# Patient Record
Sex: Female | Born: 1935 | Race: White | Hispanic: No | Smoking: Never smoker
Health system: Southern US, Community
[De-identification: ages and names within clinical notes are randomized; demographics above are authoritative.]

## PROBLEM LIST (undated history)

## (undated) DIAGNOSIS — K219 Gastro-esophageal reflux disease without esophagitis: Secondary | ICD-10-CM

## (undated) DIAGNOSIS — J45909 Unspecified asthma, uncomplicated: Secondary | ICD-10-CM

## (undated) DIAGNOSIS — M5030 Other cervical disc degeneration, unspecified cervical region: Secondary | ICD-10-CM

## (undated) DIAGNOSIS — M797 Fibromyalgia: Secondary | ICD-10-CM

## (undated) DIAGNOSIS — I10 Essential (primary) hypertension: Secondary | ICD-10-CM

## (undated) DIAGNOSIS — J40 Bronchitis, not specified as acute or chronic: Secondary | ICD-10-CM

## (undated) DIAGNOSIS — M419 Scoliosis, unspecified: Secondary | ICD-10-CM

## (undated) DIAGNOSIS — M1990 Unspecified osteoarthritis, unspecified site: Secondary | ICD-10-CM

## (undated) HISTORY — PX: TOTAL HIP REVISION: SHX763

## (undated) HISTORY — PX: TOTAL ABDOMINAL HYSTERECTOMY W/ BILATERAL SALPINGOOPHORECTOMY: SHX83

## (undated) HISTORY — PX: APPENDECTOMY: SHX54

## (undated) HISTORY — DX: Gastro-esophageal reflux disease without esophagitis: K21.9

## (undated) HISTORY — DX: Bronchitis, not specified as acute or chronic: J40

## (undated) HISTORY — DX: Essential (primary) hypertension: I10

## (undated) HISTORY — DX: Unspecified osteoarthritis, unspecified site: M19.90

## (undated) HISTORY — PX: TOTAL ABDOMINAL HYSTERECTOMY: SHX209

## (undated) HISTORY — PX: COLONOSCOPY: SHX174

## (undated) HISTORY — PX: ERCP: SHX60

---

## 1997-12-13 ENCOUNTER — Other Ambulatory Visit: Admission: RE | Admit: 1997-12-13 | Discharge: 1997-12-13 | Payer: Self-pay | Admitting: Gynecology

## 1999-03-28 ENCOUNTER — Other Ambulatory Visit: Admission: RE | Admit: 1999-03-28 | Discharge: 1999-03-28 | Payer: Self-pay | Admitting: Gynecology

## 2000-03-11 ENCOUNTER — Encounter: Admission: RE | Admit: 2000-03-11 | Discharge: 2000-03-11 | Payer: Self-pay | Admitting: Neurosurgery

## 2000-03-11 ENCOUNTER — Encounter: Payer: Self-pay | Admitting: Neurosurgery

## 2000-06-14 ENCOUNTER — Other Ambulatory Visit: Admission: RE | Admit: 2000-06-14 | Discharge: 2000-06-14 | Payer: Self-pay | Admitting: *Deleted

## 2001-07-05 ENCOUNTER — Emergency Department (HOSPITAL_COMMUNITY): Admission: EM | Admit: 2001-07-05 | Discharge: 2001-07-05 | Payer: Self-pay | Admitting: *Deleted

## 2002-03-04 ENCOUNTER — Ambulatory Visit (HOSPITAL_COMMUNITY): Admission: RE | Admit: 2002-03-04 | Discharge: 2002-03-04 | Payer: Self-pay | Admitting: Gastroenterology

## 2002-05-18 ENCOUNTER — Other Ambulatory Visit: Admission: RE | Admit: 2002-05-18 | Discharge: 2002-05-18 | Payer: Self-pay | Admitting: Obstetrics & Gynecology

## 2002-11-26 ENCOUNTER — Emergency Department (HOSPITAL_COMMUNITY): Admission: AD | Admit: 2002-11-26 | Discharge: 2002-11-26 | Payer: Self-pay | Admitting: Family Medicine

## 2003-05-26 ENCOUNTER — Encounter: Admission: RE | Admit: 2003-05-26 | Discharge: 2003-07-08 | Payer: Self-pay | Admitting: Internal Medicine

## 2004-10-10 ENCOUNTER — Ambulatory Visit: Payer: Self-pay | Admitting: Internal Medicine

## 2004-10-13 ENCOUNTER — Ambulatory Visit: Payer: Self-pay | Admitting: Internal Medicine

## 2004-10-25 ENCOUNTER — Ambulatory Visit: Payer: Self-pay | Admitting: Internal Medicine

## 2004-11-20 ENCOUNTER — Ambulatory Visit: Payer: Self-pay | Admitting: Internal Medicine

## 2004-11-27 ENCOUNTER — Ambulatory Visit: Payer: Self-pay | Admitting: Internal Medicine

## 2005-04-18 ENCOUNTER — Ambulatory Visit: Payer: Self-pay | Admitting: Otolaryngology

## 2005-04-18 ENCOUNTER — Other Ambulatory Visit: Payer: Self-pay

## 2005-04-26 ENCOUNTER — Ambulatory Visit: Payer: Self-pay | Admitting: Otolaryngology

## 2006-12-03 ENCOUNTER — Encounter: Admission: RE | Admit: 2006-12-03 | Discharge: 2006-12-03 | Payer: Self-pay | Admitting: Orthopedic Surgery

## 2007-03-30 HISTORY — PX: TOTAL HIP ARTHROPLASTY: SHX124

## 2007-04-21 ENCOUNTER — Encounter: Admission: RE | Admit: 2007-04-21 | Discharge: 2007-04-21 | Payer: Self-pay | Admitting: Obstetrics and Gynecology

## 2007-11-10 ENCOUNTER — Inpatient Hospital Stay (HOSPITAL_COMMUNITY): Admission: RE | Admit: 2007-11-10 | Discharge: 2007-11-14 | Payer: Self-pay | Admitting: Orthopedic Surgery

## 2008-02-23 ENCOUNTER — Encounter: Admission: RE | Admit: 2008-02-23 | Discharge: 2008-02-23 | Payer: Self-pay | Admitting: Neurosurgery

## 2009-01-29 HISTORY — PX: SHOULDER ARTHROSCOPY: SHX128

## 2009-05-21 ENCOUNTER — Encounter: Admission: RE | Admit: 2009-05-21 | Discharge: 2009-05-21 | Payer: Self-pay | Admitting: Orthopedic Surgery

## 2009-07-11 ENCOUNTER — Emergency Department (HOSPITAL_COMMUNITY): Admission: EM | Admit: 2009-07-11 | Discharge: 2009-07-11 | Payer: Self-pay | Admitting: Emergency Medicine

## 2009-12-01 ENCOUNTER — Encounter: Payer: Self-pay | Admitting: Internal Medicine

## 2009-12-05 ENCOUNTER — Encounter: Payer: Self-pay | Admitting: Internal Medicine

## 2009-12-07 ENCOUNTER — Encounter: Payer: Self-pay | Admitting: Internal Medicine

## 2009-12-07 ENCOUNTER — Ambulatory Visit: Payer: Self-pay | Admitting: Internal Medicine

## 2009-12-07 DIAGNOSIS — R0989 Other specified symptoms and signs involving the circulatory and respiratory systems: Secondary | ICD-10-CM | POA: Insufficient documentation

## 2009-12-07 DIAGNOSIS — M159 Polyosteoarthritis, unspecified: Secondary | ICD-10-CM | POA: Insufficient documentation

## 2009-12-07 DIAGNOSIS — J309 Allergic rhinitis, unspecified: Secondary | ICD-10-CM | POA: Insufficient documentation

## 2009-12-07 DIAGNOSIS — I10 Essential (primary) hypertension: Secondary | ICD-10-CM | POA: Insufficient documentation

## 2009-12-07 DIAGNOSIS — J209 Acute bronchitis, unspecified: Secondary | ICD-10-CM | POA: Insufficient documentation

## 2009-12-09 ENCOUNTER — Inpatient Hospital Stay (HOSPITAL_COMMUNITY): Admission: RE | Admit: 2009-12-09 | Discharge: 2009-12-11 | Payer: Self-pay | Admitting: Orthopedic Surgery

## 2009-12-13 LAB — CONVERTED CEMR LAB: IgE (Immunoglobulin E), Serum: 25.8 intl units/mL (ref 0.0–180.0)

## 2010-01-13 ENCOUNTER — Ambulatory Visit: Payer: Self-pay | Admitting: Internal Medicine

## 2010-02-18 ENCOUNTER — Encounter: Payer: Self-pay | Admitting: Internal Medicine

## 2010-02-19 ENCOUNTER — Encounter: Payer: Self-pay | Admitting: General Surgery

## 2010-03-02 NOTE — Letter (Signed)
Summary: Minna Merritts MD  Minna Merritts MD   Imported By: Phillis Knack 12/10/2009 11:44:15  _____________________________________________________________________  External Attachment:    Type:   Image     Comment:   External Document

## 2010-03-02 NOTE — Assessment & Plan Note (Signed)
Summary: consult-?bronchitis/shoulder surgery in am/ok per CDY/kcw   Primary Provider/Referring Provider:  Veronia Beets, MD  CC:  Pulmonary Consult- ? bronchitis; Shoulder surgery in am; Dr. Ernesto Rutherford.Marland Kitchen  History of Present Illness: December 07, 2009- 76yoF referred by Dr Ernesto Rutherford for evaluation of bronchitis. He recently did laryngoscopy, reporting cords and naspopharynx clear, but seeing mucus in tracheobronchial tree. She is about to have shoulder surgery by Dr Onnie Graham.  She smoked for a little while in her 20's, and was on allergy vaccine for a year remotely for allergic rhinitis, but no hx of known lung disease or pneumonia.  Over the past 3 years she has had several episodes of nonseasonal bronchitis with some persistent dry cough and wheeze. Most episodes are associated with laryngitis and hoarseness, and tend to last several days to a week. The only consistent trigger she says has been exposure to cats or to people who have cats. She admits hx GERD and is on prilosec, but not aware of a connection. Denies fever, chills, purulent or bloody secretions, chest pain or palpitation. Her most recent flare began 5 days ago and is improving. Prior to that she had felt very well while on a series of antibiotics by Dr Ernesto Rutherford, followed by his laryngoscopy.  Hx recurrent tonsillits. says Mupiricin nasal cream for surgical prep has burned in her nose. Has never had flu or pneumonia vaccine and doesn't want them the day before surgery.  CXR- 12/05/09- No acute disease, hyperinflation suggestive of airtrapping, scoliosis. Some linear marking in lower zones due to scarring or atelectasis.  Preventive Screening-Counseling & Management  Alcohol-Tobacco     Smoking Status: quit     Year Quit: in her 20's  Current Medications (verified): 1)  Synthroid 112 Mcg Tabs (Levothyroxine Sodium) .... Take 1 By Mouth Once Daily 2)  Singulair 10 Mg Tabs (Montelukast Sodium) .... Take 1 By Mouth Once Daily 3)  Klor-Con  10 10 Meq Cr-Tabs (Potassium Chloride) .... Take 1 By Mouth Once Daily 4)  Benicar Hct 20-12.5 Mg Tabs (Olmesartan Medoxomil-Hctz) .... Take 1 By Mouth Once Daily 5)  Prilosec 20 Mg Cpdr (Omeprazole) .... Take 1 By Mouth Once Daily 6)  Fish Oil 1000 Mg Caps (Omega-3 Fatty Acids) .... Take 1 By Mouth Once Daily 7)  Flax Seed Oil 1000 Mg Caps (Flaxseed (Linseed)) .... Take 1 By Mouth Once Daily 8)  Vitamin D3 1000 Unit Caps (Cholecalciferol) .... Take 1 By Mouth Once Daily 9)  Claritin 10 Mg Tabs (Loratadine) .... Take 1 By Mouth Once Daily  Allergies (verified): 1)  ! Ampicillin 2)  ! Doxycycline 3)  ! Pcn 4)  ! Biaxin 5)  ! Keflex 6)  ! * Tequin 7)  ! Codeine  Past History:  Family History: Last updated: 12/07/2009 Allergies: mother Asthma: mother Heart Disease: Grandmother, Grandfather RA: Grandmother Mother- died ovarian cancer Father- died pancreatic cancer Sister- Multiple Sclerosis, recurrent DVT/ coumadin Son- died after traumatic quadriplegia  Social History: Last updated: 12/07/2009 Patient states former smoker. 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 GERD Osteoarthritis  Past Surgical History: Bilateral hip replacement T A H and B S O Appendectomy  Family History: Allergies: mother Asthma: mother Heart Disease: Grandmother, Grandfather RA: Grandmother Mother- died ovarian cancer Father- died pancreatic cancer Sister- Multiple Sclerosis, recurrent DVT/ coumadin Son- died after traumatic quadriplegia  Social History: Patient states former  smoker. 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         Occasional pain across back at strap level  Vital Signs:  Patient profile:   77 year old female Height:      60 inches Weight:      161 pounds BMI:     31.56 O2 Sat:      95 % on Room air Pulse rate:   75 / minute BP sitting:   108 / 70  (left arm) Cuff size:   regular  Vitals Entered By: Clayborne Dana CMA (December 07, 2009 2:35 PM)  O2 Flow:  Room air  Physical Exam  Additional Exam:  General: A/Ox3; pleasant and cooperative, NAD,  Room air O2 sat 95%, P 75, BP 108/70 SKIN: no rash, lesions NODES: no lymphadenopathy HEENT: [place]/AT, EOM- WNL, Conjuctivae- clear, PERRLA, TM-WNL, Nose- clear, no mucus, Throat- clear and wnl, Mallampati  II, no stridor NECK: Supple w/ fair ROM, JVD- none, Carotids- I / VI bruit left carotid.  Thyroid- normal to palpation CHEST: raspy occasional cough. unlabored, no dullness, rales or wheeze HEART: RRR, no m/g/r heard ABDOMEN: Soft and nl; nml bowel sounds; no organomegaly or masses noted [id number], nl pulses, no edema, cyanosis or clubbing. Osteoarthritic changes in fingers. NEURO: Grossly intact to observation      Impression & Recommendations:  Problem # 1:  ACUTE BRONCHITIS (ICD-466.0)  Resolving flare with residual raspy cough. We will get spirometry here and start sample Spiriva for trial.  She blames cat exposure. We can get RAST IgE assay to look at allergy significiance.  I am very suspicious that she recurrently refluxes as a trigger for her bronchitis. That can result from the anxiety she gets just sitting next to someone who she learns has a  cat. Reflux precautions are recommended. She is stable from a pulmonary standpoint for planned  surgery, but is certainly at long term risk for recurrent bronchitis. We can see her for pulmonary consultation as needed.     Her updated medication list for this problem includes:    Singulair 10 Mg Tabs (Montelukast sodium) .Marland Kitchen... Take 1 by mouth once daily  Orders: Consultation Level IV [id number]) T-Allergy Profile [facility] [place], [place], [place], [place], [place] [phone])  Problem # 2:  CAROTID BRUIT, LEFT (ICD-785.9) I am hearing a faint diffuse left carotid bruit, directed to the attention of her PCP.  Problem # 3:  ? of GERD (ICD-530.81) See discussion above. Reflux precautions emphasized.  Her updated medication list for this problem includes:    Prilosec 20 Mg Cpdr (Omeprazole) .Marland Kitchen... Take 1 by mouth once daily  Medications Added to Medication List This Visit: 1)  Synthroid 112 Mcg Tabs (Levothyroxine sodium) .... Take 1 by mouth once daily 2)  Singulair 10 Mg Tabs (Montelukast sodium) .... Take 1 by mouth once daily 3)  Klor-con 10 10 Meq Cr-tabs (Potassium chloride) .... Take 1 by mouth once daily 4)  Benicar Hct 20-12.5 Mg Tabs (Olmesartan medoxomil-hctz) .... Take 1 by mouth once daily 5)  Prilosec 20 Mg Cpdr (  Omeprazole) .... Take 1 by mouth once daily 6)  Fish Oil 1000 Mg Caps (Omega-3 fatty acids) .... Take 1 by mouth once daily 7)  Flax Seed Oil 1000 Mg Caps (Flaxseed (linseed)) .... Take 1 by mouth once daily 8)  Vitamin D3 1000 Unit Caps (Cholecalciferol) .... Take 1 by mouth once daily 9)  Claritin 10 Mg Tabs (Loratadine) .... Take 1 by mouth once daily  Patient Instructions: 1)  Please schedule a follow-up appointment in 1 month. 2)  Please watch out for possible reflux of stomach juice. Sit upright to eat or drink and don't lie down for an hour after you eat or drink. 3)  sample Spiriva 1 daily 4)  Office PFT today 5)  OK for surgery tomorrow 6)  lab 7)  cc: Dr Ernesto Rutherford, Dr Onnie Graham, Dr Melford Aase

## 2010-03-02 NOTE — Assessment & Plan Note (Signed)
Summary: rov 1 month ///kp   Primary Provider/Referring Provider:  Veronia Beets, MD  CC:  1 month follow up visit-allergies; Review labs with patient..  History of Present Illness: History of Present Illness: December 07, 2009- 76yoF referred by Dr Ernesto Rutherford for evaluation of bronchitis. He recently did laryngoscopy, reporting cords and naspopharynx clear, but seeing mucus in tracheobronchial tree. She is about to have shoulder surgery by Dr Onnie Graham.  She smoked for a little while in her 20's, and was on allergy vaccine for a year remotely for allergic rhinitis, but no hx of known lung disease or pneumonia.  Over the past 3 years she has had several episodes of nonseasonal bronchitis with some persistent dry cough and wheeze. Most episodes are associated with laryngitis and hoarseness, and tend to last several days to a week. The only consistent trigger she says has been exposure to cats or to people who have cats. She admits hx GERD and is on prilosec, but not aware of a connection. Denies fever, chills, purulent or bloody secretions, chest pain or palpitation. Her most recent flare began 5 days ago and is improving. Prior to that she had felt very well while on a series of antibiotics by Dr Ernesto Rutherford, followed by his laryngoscopy.  Hx recurrent tonsillits. says Mupiricin nasal cream for surgical prep has burned in her nose. Has never had flu or pneumonia vaccine and doesn't want them the day before surgery.  CXR- 12/05/09- No acute disease, hyperinflation suggestive of airtrapping, scoliosis. Some linear marking in lower zones due to scarring or atelectasis.  February 14, 2010- Bronchitis, hx allergic rhinitis Nurse-CC: 1 month follow up visit-allergies; Review labs with patient. After last here she had right shoulder replacement with prolonged recuperation. Also had to help her sister, with unavoidable exposure to sister's cat- very allergic to it with chest congestion/ wheeze, cough. Gets laryngitis.  Hx of allergy shots with Dr Velora Heckler not helpful in past.  Says peanut allergy seen w/ rash after eating, but not on skin test. Avoids friend who has cat.  Denies reflux taking Prilosec.   Consider barium swallow    Preventive Screening-Counseling & Management  Alcohol-Tobacco     Smoking Status: quit     Year Quit: in her 20's  Current Medications (verified): 1)  Synthroid 112 Mcg Tabs (Levothyroxine Sodium) .... Take 1 By Mouth Every Other Day 2)  Singulair 10 Mg Tabs (Montelukast Sodium) .... Take 1 By Mouth Once Daily 3)  Klor-Con 10 10 Meq Cr-Tabs (Potassium Chloride) .... Take 1 By Mouth Once Daily 4)  Benicar Hct 20-12.5 Mg Tabs (Olmesartan Medoxomil-Hctz) .... Take 1 By Mouth Once Daily 5)  Prilosec 20 Mg Cpdr (Omeprazole) .... Take 1 By Mouth Once Daily 6)  Fish Oil 1000 Mg Caps (Omega-3 Fatty Acids) .... Take 1 By Mouth Once Daily 7)  Flax Seed Oil 1000 Mg Caps (Flaxseed (Linseed)) .... Take 1 By Mouth Once Daily 8)  Vitamin D3 1000 Unit Caps (Cholecalciferol) .... Take 1 By Mouth Once Daily 9)  Claritin 10 Mg Tabs (Loratadine) .... Take 1 By Mouth Once Daily 10)  Synthroid 100 Mcg Tabs (Levothyroxine Sodium) .... Take 1 By Mouth Every Other Day 11)  Symbicort 80-4.5 Mcg/act Aero (Budesonide-Formoterol Fumarate) .... 2 Puffs Two Times A Day and Rinse Mouth After Use 12)  Proair Hfa 108 (90 Base) Mcg/act Aers (Albuterol Sulfate) .... 2 Puffs Four Times A Day As Needed  Allergies (verified): 1)  ! Ampicillin 2)  ! Doxycycline 3)  !  Pcn 4)  ! Biaxin 5)  ! Keflex 6)  ! * Tequin 7)  ! Codeine  Past History:  Past Medical History: Last updated: 12/07/2009 Recurrent bronchitis Hypertension ? GERD Osteoarthritis  Past Surgical History: Last updated: 12/07/2009 Bilateral hip replacement T A H and B S O Appendectomy  Family History: Last updated: 12/07/2009 Allergies: mother Asthma: mother Heart Disease: Grandmother, Grandfather RA: Grandmother Mother- died  ovarian cancer Father- died pancreatic cancer Sister- Multiple Sclerosis, recurrent DVT/ coumadin Son- died after traumatic quadriplegia  Social History: Last updated: 12/07/2009 Patient states former smoker. 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  The patient denies shortness of breath with activity, shortness of breath at rest, productive cough, coughing up blood, chest pain, irregular heartbeats, acid heartburn, indigestion, loss of appetite, difficulty swallowing, sore throat, tooth/dental problems, headaches, sneezing, itching, ear ache, hand/feet swelling, rash, change in color of mucus, and fever.    Vital Signs:  Patient profile:   77 year old female Height:      60 inches Weight:      155.25 pounds BMI:     30.43 O2 Sat:      98 % on Room air Pulse rate:   66 / minute BP sitting:   122 / 76  (left arm) Cuff size:   regular  Vitals Entered By: Clayborne Dana CMA (February 14, 2010 4:07 PM)  O2 Flow:  Room air CC: 1 month follow up visit-allergies; Review labs with patient.   Physical Exam  Additional Exam:  General: A/Ox3; pleasant and cooperative, NAD,  Room air O2 sat 98%, P 75, BP 108/70 SKIN: no rash, lesions NODES: no lymphadenopathy HEENT: [place]/AT, EOM- WNL, Conjuctivae- clear, PERRLA, TM-WNL, Nose- clear, no mucus, Throat- clear and wnl, Mallampati  II, no stridor NECK: Supple w/ fair ROM, JVD- none, Carotids- no bruit heard today.  Thyroid- normal to palpation CHEST: . unlabored, no dullness, rales or wheeze HEART: RRR, no m/g/r heard ABDOMEN: Soft and nl; nml bowel sounds; no organomegaly or masses noted [id number], nl pulses, no edema, cyanosis or clubbing. Osteoarthritic changes in fingers. NEURO: Grossly  intact to observation      Impression & Recommendations:  Problem # 1:  ACUTE BRONCHITIS (ICD-466.0)  She feels she reacts acutely with sustained hoarseness and congestion after exposure to cats. It may be a non allergic or nonIgE mechanism. Consider if reflux could be involved?  The situations may be associated with anxiety causing some esophageal spasm.  I reasurred her that Symbicort would not cause skin thinning on her leg. She didn't feel that Spiriva helped. We will try Atrovent inhaler for the anticholinergic mechanism.   Her updated medication list for this problem includes:    Singulair 10 Mg Tabs (Montelukast sodium) .Marland Kitchen... Take 1 by mouth once daily    Symbicort 80-4.5 Mcg/act Aero (Budesonide-formoterol fumarate) .Marland Kitchen... 2 puffs two times a day and rinse mouth after use    Proair Hfa 108 (90 Base) Mcg/act Aers (Albuterol sulfate) .Marland Kitchen... 2 puffs four times a day as needed    Atrovent Hfa 17 Mcg/act Aers (Ipratropium bromide hfa) .Marland Kitchen... 2 puffs four times a day as needed rescue inhaler  Problem # 2:  ALLERGIC RHINITIS (ICD-477.9)  Try nasalcrom for a different  mechainism of action.  Her updated medication list for this problem includes:    Claritin 10 Mg Tabs (Loratadine) .Marland Kitchen... Take 1 by mouth once daily  Medications Added to Medication List This Visit: 1)  Synthroid 112 Mcg Tabs (Levothyroxine sodium) .... Take 1 by mouth every other day 2)  Synthroid 100 Mcg Tabs (Levothyroxine sodium) .... Take 1 by mouth every other day 3)  Symbicort 80-4.5 Mcg/act Aero (Budesonide-formoterol fumarate) .... 2 puffs two times a day and rinse mouth after use 4)  Proair Hfa 108 (90 Base) Mcg/act Aers (Albuterol sulfate) .... 2 puffs four times a day as needed 5)  Atrovent Hfa 17 Mcg/act Aers (Ipratropium bromide hfa) .... 2 puffs four times a day as needed rescue inhaler  Other Orders: Est. Patient Level III [id number])  Patient Instructions: 1)  Please schedule a follow-up appointment in 1  month. 2)  Try script Atrovent HFA inhaler 3)      2 puffs 4 times daily as needed for chest tightness/ cough 4)      Try this instead of the yellow proventil or red Proair 5)  continue Symbicort 2 puffs and rinse mouth twice daily 6)  Try otc Nasalcrom/ cromolyn for allergic nose. Prescriptions: ATROVENT HFA 17 MCG/ACT AERS (IPRATROPIUM BROMIDE HFA) 2 puffs four times a day as needed rescue inhaler  #1 x prn   Entered and Authorized by:   Deneise Lever MD   Signed by:   Deneise Lever MD on 02/14/2010   Method used:   Print then Give to Patient   RxID:   [id number]

## 2010-03-13 NOTE — Discharge Summary (Signed)
NAME:  Penny Perez, Penny Perez NO.:  [id number]  MEDICAL RECORD NO.:  [id number]          PATIENT TYPE:  INP  LOCATION:  5013                         FACILITY:  [facility]  PHYSICIAN:  Metta Clines. Peggy Monk, M.D.  DATE OF BIRTH:  1932/03/15  DATE OF ADMISSION:  12/08/2009 DATE OF DISCHARGE:  12/11/2009                              DISCHARGE SUMMARY   ADMISSION DIAGNOSES: 1. End-stage osteoarthrosis, right shoulder with rotator cuff tear     arthropathy. 2. Hypertension. 3. Fibromyalgia. 4. Hypothyroidism. 5. Anxiety and depression. 6. History of chronic kidney disease, most likely due to nonsteroidal     anti-inflammatories.  DISCHARGE DIAGNOSES: 1. End-stage osteoarthrosis, right shoulder with rotator cuff tear     arthropathy. 2. Hypertension. 3. Fibromyalgia. 4. Hypothyroidism. 5. Anxiety and depression. 6. History of chronic kidney disease, most likely due to nonsteroidal     anti-inflammatories. 7. Status post right shoulder reverse arthroplasty.  OPERATION:  Right shoulder reverse arthroplasty.  SURGEON:  Metta Clines. Tyquisha Sharps, M.D.  Terrence DupontOlivia Mackie A. Perez, P.A.-C. under general anesthetic.  BRIEF HISTORY:  Penny Perez is a 77 year old female who has had chronic right shoulder pain secondary to chronic rotator cuff tear arthropathy with failure to improve on outpatient conservative measures including injections and conservative treatment.  At this time, surgery was indicated in the form of above and she wished to proceed.  HOSPITAL COURSE:  The patient was admitted and underwent the above-named procedure, and tolerated this well.  All appropriate IV antibiotics and analgesics were utilized.  The patient did have a fair amount of pain postop that required additional stay.  She was miserable as she explained it for the first few days.  She was on chronic Percocet for low back pain, so medications were provided in the hospital, were difficult to manage.  She  was able to be weaned off IV analgesics.  She was having difficult time ambulating, however, so PT/OT were ordered. She was kept over the next several days, home health was consulted for postoperative care.  Her pain improved, by postoperative day #3, however, she did have some complaints of indigestion and some moderate discomfort into her chest.  EKG showed no change, this was felt to be more muscular in origin.  The patient stabilized and all arrangements were made for discharge to home for followup on outpatient basis.  On the day of discharge, she was afebrile with T-max of 99.1, right shoulder was neurovascularly intact.  Dressings were dry.  At this time, she was stable for discharge to home with home health ordered through [place].  CONDITION ON DISCHARGE:  Stable and improved.  DISCHARGE MEDS AND PLANS:  The patient had been discharged to home. [name] has been consulted for home health care.  She is on the following medications;  Dilaudid, methocarbamol, and Percocet as well as Restoril added to her home medications.  See med reconciliation for additional details.  Follow up in our office in 2 weeks, call for time. All the rest of instructions were provided to the patient at the time of discharge.     Penny Perez, P.A.-C.   ______________________________ Metta Clines. Charline Hoskinson,  M.D.    TAS/MEDQ  D:  01/04/2010  T:  01/05/2010  Job:  [id number]  Electronically Signed by Jenetta Loges P.A.-C. on 02/22/2010 08:15:51 AM Electronically Signed by Justice Britain M.D. on 03/13/2010 12:04:13 PM

## 2010-03-21 ENCOUNTER — Encounter: Payer: Self-pay | Admitting: Internal Medicine

## 2010-03-21 ENCOUNTER — Ambulatory Visit (INDEPENDENT_AMBULATORY_CARE_PROVIDER_SITE_OTHER): Payer: Medicare Other | Admitting: Internal Medicine

## 2010-03-21 DIAGNOSIS — J209 Acute bronchitis, unspecified: Secondary | ICD-10-CM

## 2010-03-21 DIAGNOSIS — J309 Allergic rhinitis, unspecified: Secondary | ICD-10-CM

## 2010-03-28 NOTE — Assessment & Plan Note (Signed)
Summary: rov 1 month/kp   Primary Provider/Referring Provider:  Veronia Beets, MD  CC:  1 month followup/ used Symbicort 1 x had reaction, mouth broke out blisters breathing is better, and occasional sneezing. pt denies sob.  History of Present Illness: February 14, 2010- Bronchitis, hx allergic rhinitis Nurse-CC: 1 month follow up visit-allergies; Review labs with patient. After last here she had right shoulder replacement with prolonged recuperation. Also had to help her sister, with unavoidable exposure to sister's cat- very allergic to it with chest congestion/ wheeze, cough. Gets laryngitis. Hx of allergy shots with Dr Velora Heckler not helpful in past.  Says peanut allergy seen w/ rash after eating, but not on skin test. Avoids friend who has cat.  Denies reflux taking Prilosec.   Consider barium swallow  March 21, 2010-  Bronchitis, hx allergic rhinitis Nurse-CC: 1 month followup/ used Atrovent HFA  had reaction, mouth broke out blisters breathing is better, occasional sneezing. pt denies sob Lungs are better. She is convinced that what bothers her lungs is exposure to cat. Her allergy profile was neg for Cat IgE and skin testing by Dr Velora Heckler was neg for cat. Allergy vacc for a year didn't help. . She claims that house dust and house cleaning might bother her. Flowers do cause sneeze, but not lungs. Blames Atrovent HFA for irritating mouth. - needed a week to clear. Today chest feels good.  Allergy profile- IgE 25.8, Specifics negative. Office spiro- 12/07/09- FEV1 1.50/85%; FVC 1.87/ 79%; R 1.07; 25-75= 108%.     Preventive Screening-Counseling & Management  Alcohol-Tobacco     Smoking Status: quit     Year Quit: in her 20's  Current Medications (verified): 1)  Synthroid 112 Mcg Tabs (Levothyroxine Sodium) .... Take 1 By Mouth Every Other Day 2)  Singulair 10 Mg Tabs (Montelukast Sodium) .... Take 1 By Mouth Once Daily 3)  Klor-Con 10 10 Meq Cr-Tabs (Potassium Chloride) .... Take 1  By Mouth Once Daily 4)  Benicar Hct 20-12.5 Mg Tabs (Olmesartan Medoxomil-Hctz) .... Take 1 By Mouth Once Daily 5)  Prilosec 20 Mg Cpdr (Omeprazole) .... Take 1 By Mouth Once Daily 6)  Fish Oil 1000 Mg Caps (Omega-3 Fatty Acids) .... Take 1 By Mouth Once Daily 7)  Flax Seed Oil 1000 Mg Caps (Flaxseed (Linseed)) .... Take 1 By Mouth Once Daily 8)  Vitamin D3 1000 Unit Caps (Cholecalciferol) .... Take 1 By Mouth Once Daily 9)  Claritin 10 Mg Tabs (Loratadine) .... Take 1 By Mouth Once Daily 10)  Synthroid 100 Mcg Tabs (Levothyroxine Sodium) .... Take 1 By Mouth Every Other Day 11)  Proair Hfa 108 (90 Base) Mcg/act Aers (Albuterol Sulfate) .... 2 Puffs Four Times A Day As Needed 12)  Atrovent Hfa 17 Mcg/act Aers (Ipratropium Bromide Hfa) .... 2 Puffs Four Times A Day As Needed Rescue Inhaler  Allergies: 1)  ! Ampicillin 2)  ! Doxycycline 3)  ! Pcn 4)  ! Biaxin 5)  ! Keflex 6)  ! * Tequin 7)  ! Codeine  Past History:  Past Surgical History: Last updated: 12/07/2009 Bilateral hip replacement T A H and B S O Appendectomy  Family History: Last updated: 12/07/2009 Allergies: mother Asthma: mother Heart Disease: Grandmother, Grandfather RA: Grandmother Mother- died ovarian cancer Father- died pancreatic cancer Sister- Multiple Sclerosis, recurrent DVT/ coumadin Son- died after traumatic quadriplegia  Social History: Last updated: 12/07/2009 Patient states former smoker. Occasionally in age 75's-social No ETOH Former Engineer, site Divorced, living alone, 3 children  Risk Factors: Smoking Status: quit (03/21/2010)  Past Medical History: Recurrent bronchitis- 12/07/09- FEV1 1.50/ 85%; FVC 1.87/ 79%; R 1.07; 25-75% = 108% Hypertension ? GERD Osteoarthritis  Review of Systems      See HPI       The patient complains of shortness of breath with activity, non-productive cough, nasal congestion/difficulty breathing through nose, and sneezing.  The patient denies shortness  of breath at rest, productive cough, coughing up blood, chest pain, irregular heartbeats, acid heartburn, indigestion, loss of appetite, weight change, abdominal pain, difficulty swallowing, sore throat, tooth/dental problems, headaches, ear ache, rash, change in color of mucus, and fever.    Vital Signs:  Patient profile:   77 year old female Height:      60 inches Weight:      153.13 pounds BMI:     30.01 O2 Sat:      99 % on Room air Pulse rate:   75 / minute BP sitting:   130 / 80  (right arm) Cuff size:   regular  Vitals Entered By: Percy (March 21, 2010 4:23 PM)  O2 Flow:  Room air CC: 1 month followup/ used Symbicort 1 x had reaction, mouth broke out blisters breathing is better, occasional sneezing. pt denies sob   Physical Exam  Additional Exam:  General: A/Ox3; pleasant and cooperative, NAD,  Room air O2 sat 99%, P 75, BP 108/70 SKIN: no rash, lesions NODES: no lymphadenopathy HEENT: [place]/AT, EOM- WNL, Conjuctivae- clear, PERRLA, TM-WNL, Nose- clear, no mucus, Throat- clear and wnl, Mallampati  II, no stridor NECK: Supple w/ fair ROM, JVD- none, Carotids- no bruit heard today.  Thyroid- normal to palpation. Voice normal. CHEST: . unlabored, no dullness, rales or wheeze HEART: RRR, no m/g/r heard ABDOMEN: Soft and nl;  [id number], nl pulses, no edema, cyanosis or clubbing. Osteoarthritic changes in fingers. NEURO: Grossly intact to observation      Impression & Recommendations:  Problem # 1:  ACUTE BRONCHITIS (ICD-466.0)  She thinks she reacts to cat, dust, some flowers with cough, sneeze. We can't document this objectively and she failed prior trial of allergyvaccine. Singulair seems to help. I don't know if she is actually having anxiety or anxiety induced reflux.  Omnaris helps. I suggested we try cromolyn throughher mouth. Will try steroid inhaler.  The following medications were removed from the medication list:    Symbicort 80-4.5 Mcg/act Aero  (Budesonide-formoterol fumarate) .Marland Kitchen... 2 puffs two times a day and rinse mouth after use Her updated medication list for this problem includes:    Singulair 10 Mg Tabs (Montelukast sodium) .Marland Kitchen... Take 1 by mouth once daily    Proair Hfa 108 (90 Base) Mcg/act Aers (Albuterol sulfate) .Marland Kitchen... 2 puffs four times a day as needed    Qvar 80 Mcg/act Aers (Beclomethasone dipropionate) .Marland Kitchen... 2 puffs and rinse mouth, two times a day  Problem # 2:  ALLERGIC RHINITIS (ICD-477.9) We will continue nasqal steroid spray and watch seasonal change. Her updated medication list for this problem includes:    Claritin 10 Mg Tabs (Loratadine) .Marland Kitchen... Take 1 by mouth once daily    Nasalcrom 5.2 Mg/act Aers (Cromolyn sodium) .Marland Kitchen... 2 sprays each nostril or through mouth as discussed.  Problem # 3:  ? of GERD (ICD-530.81) I suspect reflux associated with stress episodes, causing laryngospasm and cough. We reviewed reflux prophyllaxis.  Her updated medication list for this problem includes:    Prilosec 20 Mg Cpdr (Omeprazole) .Marland Kitchen... Take 1 by mouth once  daily  Medications Added to Medication List This Visit: 1)  Qvar 80 Mcg/act Aers (Beclomethasone dipropionate) .... 2 puffs and rinse mouth, two times a day 2)  Nasalcrom 5.2 Mg/act Aers (Cromolyn sodium) .... 2 sprays each nostril or through mouth as discussed. 3)  Aerochamber   Other Orders: Est. Patient Level IV [id number])  Patient Instructions: 1)  Please schedule a follow-up appointment in 2 months. 2)  Your ribs seem to hurt because you have lost height, so they are being squeezed together more. 3)  Sample/ script Qvar 80- 2 puffs and rinse mouth, twice daily 4)  Try otc nasalcrom/ cromoly nasal spray- either as needed or on a regular maintenance basis by the directions on the box. 5)        You might find you can put it into your mouth and inhale it down your airway.  6)  Try aerochamber spacer on your inhalers that cause mouth irritation 7)  cc Dr  Melford Aase Prescriptions: AEROCHAMBER   #1 x prn   Entered and Authorized by:   Deneise Lever MD   Signed by:   Deneise Lever MD on 03/21/2010   Method used:   Print then Give to Patient   RxID:   [id number] NASALCROM 5.2 MG/ACT AERS (CROMOLYN SODIUM) 2 sprays each nostril or through mouth as discussed.  #1 x prn   Entered and Authorized by:   Deneise Lever MD   Signed by:   Deneise Lever MD on 03/21/2010   Method used:   Historical   [id number] QVAR 80 MCG/ACT AERS (BECLOMETHASONE DIPROPIONATE) 2 puffs and rinse mouth, two times a day  #1 x prn   Entered and Authorized by:   Deneise Lever MD   Signed by:   Deneise Lever MD on 03/21/2010   Method used:   Print then Give to Patient   RxID:   [id number]

## 2010-04-11 LAB — CBC
HCT: 38.1 % (ref 36.0–46.0)
Platelets: 234 10*3/uL (ref 150–400)
RBC: 4.11 MIL/uL (ref 3.87–5.11)
RDW: 13 % (ref 11.5–15.5)

## 2010-04-11 LAB — COMPREHENSIVE METABOLIC PANEL
AST: 27 U/L (ref 0–37)
Alkaline Phosphatase: 78 U/L (ref 39–117)
Calcium: 9.8 mg/dL (ref 8.4–10.5)
Glucose, Bld: 74 mg/dL (ref 70–99)
Potassium: 3.7 mEq/L (ref 3.5–5.1)

## 2010-04-11 LAB — URINALYSIS, ROUTINE W REFLEX MICROSCOPIC
Glucose, UA: NEGATIVE mg/dL
Hgb urine dipstick: NEGATIVE
Ketones, ur: NEGATIVE mg/dL
Nitrite: NEGATIVE
Urobilinogen, UA: 0.2 mg/dL (ref 0.0–1.0)
pH: 6 (ref 5.0–8.0)

## 2010-04-11 LAB — SURGICAL PCR SCREEN: Staphylococcus aureus: POSITIVE — AB

## 2010-04-11 LAB — GLUCOSE, CAPILLARY: Glucose-Capillary: 124 mg/dL — ABNORMAL HIGH (ref 70–99)

## 2010-04-11 LAB — PROTIME-INR: Prothrombin Time: 13.2 seconds (ref 11.6–15.2)

## 2010-05-23 ENCOUNTER — Encounter: Payer: Self-pay | Admitting: Internal Medicine

## 2010-05-25 ENCOUNTER — Encounter: Payer: Self-pay | Admitting: Internal Medicine

## 2010-05-25 ENCOUNTER — Ambulatory Visit (INDEPENDENT_AMBULATORY_CARE_PROVIDER_SITE_OTHER): Payer: Medicare Other | Admitting: Internal Medicine

## 2010-05-25 VITALS — BP 130/76 | HR 71 | Ht 60.0 in | Wt 150.4 lb

## 2010-05-25 DIAGNOSIS — J209 Acute bronchitis, unspecified: Secondary | ICD-10-CM

## 2010-05-25 DIAGNOSIS — J309 Allergic rhinitis, unspecified: Secondary | ICD-10-CM

## 2010-05-25 MED ORDER — ALBUTEROL SULFATE HFA 108 (90 BASE) MCG/ACT IN AERS: 2.0000 | INHALATION_SPRAY | Freq: Four times a day (QID) | RESPIRATORY_TRACT | Status: DC | PRN

## 2010-05-25 NOTE — Assessment & Plan Note (Signed)
Well-controlled currently 

## 2010-05-25 NOTE — Patient Instructions (Signed)
Proair refill prescription sent  A dust mask from your drug store might protect you from the asthma triggers when you visit your friend, especially if you take it off and wash your hands on leaving. You could also pre-treat yourself with Proair before you visit.

## 2010-05-25 NOTE — Assessment & Plan Note (Signed)
Still not clear if she gets an actual bronchospasm directly related to cat exposure, a reflux event triggering bronchospasm, or an anxiety attack. She believes in Proair so I will refill that.

## 2010-05-25 NOTE — Progress Notes (Signed)
  Subjective:    Patient ID: Penny Perez, female    DOB: 05/26/1932, 77 y.o.   MRN: [id number]  HPI 45 yoF followed for allergic rhinitis, bronchitis. Last here March 21, 2010- note reviewed. No recent colds. She is doing well with the pollen season and says she only has trouble around cats or people who were in contact with cats. Proair is the only inhaler she finds helps if exposed to cats. She did not find any preventative benefit from using Qvar and nasonex with an aerochamber, and says Qvar made her mouth sore.,  She continues daily Singulair. Denies wheeze or chest tightness from weather or other triggers.  Incidental pain intermittently under left scapula. Hx of degenerative disk disease - directed to her PCP Review of Systems See HPI Constitutional:   No weight loss, night sweats,  Fevers, chills, fatigue, lassitude. HEENT:   No headaches,  Difficulty swallowing,  Tooth/dental problems,  Sore throat,                No sneezing, itching, ear ache, nasal congestion, post nasal drip,   CV:  No chest pain,  Orthopnea, PND, swelling in lower extremities, anasarca, dizziness, palpitations  GI  No heartburn, indigestion, abdominal pain, nausea, vomiting, diarrhea, change in bowel habits, loss of appetite  Resp: No shortness of breath with exertion or at rest.  No excess mucus, no productive cough,  No non-productive cough,  No coughing up of blood.  No change in color of mucus.  No wheezing.  No chest wall deformity  Skin: no rash or lesions.  GU: no dysuria, change in color of urine, no urgency or frequency.  No flank pain.  MS:  No joint pain or swelling.  No decreased range of motion.  No back pain.  Psych:  No change in mood or affect. No depression or anxiety.  No memory loss.      Objective:   Physical Exam General- Alert, Oriented, Affect-appropriate, Distress- none acute  Skin- rash-none, lesions- none, excoriation- none  Lymphadenopathy- none  Head-  atraumatic  Eyes- Gross vision intact, PERRLA, conjunctivae clear secretions  Ears- OK-  Hearing, canals, Tm L , R ,  Nose- Clear, Septal dev, mucus, polyps, erosion, perforation   Throat- Mallampati II , mucosa clear , drainage- none, tonsils- atrophic  Neck- flexible , trachea midline, no stridor , thyroid nl, carotid no bruit  Chest - symmetrical excursion , unlabored     Heart/CV- RRR , no murmur , no gallop  , no rub, nl s1 s2                     - JVD- none , edema- none, stasis changes- none, varices- none     Lung- clear to P&A, wheeze- none, cough- none , dullness-none, rub- none     Chest wall-  Abd- tender-no, distended-no, bowel sounds-present, HSM- no  Br/ Gen/ Rectal- Not done, not indicated  Extrem- cyanosis- none, clubbing, none, atrophy- none, strength- nl  Neuro- grossly intact to observation         Assessment & Plan:

## 2010-06-13 NOTE — H&P (Signed)
NAME:  Penny Perez, Penny Perez NO.:  [id number]   MEDICAL RECORD NO.:  [id number]          PATIENT TYPE:  INP   LOCATION:  NA                           FACILITY:  [facility]   PHYSICIAN:  Gaynelle Arabian, M.D.    DATE OF BIRTH:  04-12-1932   DATE OF ADMISSION:  DATE OF DISCHARGE:                              HISTORY & PHYSICAL   .   CHIEF COMPLAINT:  Right hip pain.   HISTORY OF PRESENT ILLNESS:  The patient is a 77 year old female, who  has been seen by Dr. Wynelle Link for ongoing right hip pain.  She has known  end-stage arthritis.  The pain has been getting worse with time.  It is  interfering with what she would like to do.  She was at a stage now  where she would like to have surgery.  The risks and benefits were  discussed, and the patient subsequently admitted to the hospital.   ALLERGIES:  1. AMPICILLIN.  2. PENICILLIN.  3. CODEINE, which causes nausea.  4. TEQUIN, which caused blisters.  5. DOXYCYCLINE.  6. BIAXIN.  [place].   FOOD ALLERGIES:  SHE HAS A FOOD ALLERGY OF PEANUTS, WHICH CAUSES A LEG  RASH.   PATIENT IS ABLE TO TAKE VICODIN AND PERCOCET.   CURRENT MEDICATIONS:  Synthroid, potassium, Benicar, Claritin, Prilosec,  fish oil, flaxseed oil, multivitamin.   PAST MEDICAL HISTORY:  1. Shingles.  2. Cataracts.  3. Asthma.  4. Hypertension.  5. Mild reflux.  6. Hypothyroidism.  7. Fibromyalgia.  8. Degenerative disk disease.  9. Eczema.   PAST SURGICAL HISTORY:  1. Hysterectomy.  2. Fissure repair.  3. Hip replacement.   FAMILY HISTORY:  Father with a history of cancer, mother with a history  of cancer.   SOCIAL HISTORY:  Divorced, a past smoker, no alcohol, 3 children, lives  alone, and no one is lined up to assist her with care after surgery.   REVIEW OF SYSTEMS:  GENERAL:  No fevers, chills, or night sweats.  NEURO:  No seizures, syncope, or paralysis.  RESPIRATORY:  She does have  a history of asthma, and no shortness of breath or  productive cough.  CARDIOVASCULAR:  No chest pain or orthopnea.  GI:  A little bit of  constipation, no diarrhea, and no nausea or vomiting.  GU:  No dysuria,  hematuria, or discharge.  MUSCULOSKELETAL:  Hip pain.   PHYSICAL EXAMINATION:  VITAL SIGNS:  Pulse 80, respirations 14, blood  pressure 152/70.  GENERAL:  She is a 77 year old white female, well-nourished, well-  developed, in no acute distress.  She is alert, oriented, cooperative,  pleasant, anxious.  HEENT:  Normocephalic, atraumatic.  Pupils are round and reactive.  EOMs  intact.  NECK:  Supple.  CHEST:  Clear.  HEART:  She has a faint early systolic ejection murmur heard over the  aortic point, S1 S2 noted.  ABDOMEN:  Soft and nontender, bowel sounds present.  RECTAL, BREASTS, GENITALIA:  Not done and not pertinent to present  illness.  EXTREMITIES:  Right hip:  Flexion 90, 0 internal rotation, 0 external  rotation, 10  degrees of abduction.   IMPRESSION:  Osteoarthritis, right hip.   PLAN:  The patient admitted to [facility] to undergo a right  total hip replacement arthroplasty.  Surgery will be performed by Dr.  Gaynelle Arabian.      Sharyl Nimrod, PA-C      Gaynelle Arabian, M.D.  Electronically Signed    EW/MEDQ  D:  11/09/2007  T:  11/09/2007  Job:  [id number]   cc:   Dr. Donnamarie Rossetti

## 2010-06-13 NOTE — Op Note (Signed)
NAME:  Penny Perez, Penny Perez NO.:  [id number]   MEDICAL RECORD NO.:  [id number]          PATIENT TYPE:  INP   LOCATION:  0012                         FACILITY:  [facility]   PHYSICIAN:  Gaynelle Arabian, M.D.    DATE OF BIRTH:  01-Aug-1932   DATE OF PROCEDURE:  11/10/2007  DATE OF DISCHARGE:                               OPERATIVE REPORT   PREOPERATIVE DIAGNOSIS:  Osteoarthritis right hip.   POSTOPERATIVE DIAGNOSIS:  Osteoarthritis right hip.   PROCEDURE:  Right total hip arthroplasty.   SURGEON:  Gaynelle Arabian, M.D.   ASSISTANT:  Alexzandrew L. Perkins, P.A.C.   ANESTHESIA:  General.   ESTIMATED BLOOD LOSS:  500.   DRAINS:  Hemovac times one.   COMPLICATIONS:  None.   CONDITION:  Stable to recovery.   BRIEF CLINICAL NOTE:  Ms. Heidtman is a 77 year old female with end-stage  arthritis of the right hip with progressively worsening pain and  dysfunction.  She had a remote left total hip arthroplasty which has  been successful.  She presents now for right total hip arthroplasty.   PROCEDURE IN DETAIL:  After the successful administration of general  anesthetic, the patient is placed in the left lateral decubitus position  with the right side up and held with the hip positioner.  The right  lower extremity was isolated from the perineum with plastic drapes and  prepped and draped in the usual sterile fashion.  Short posterolateral  incision is made with a 10 blade through subcutaneous tissue to the  level of the fascia lata which was incised in line with the skin  incision.  Sciatic nerve was palpated and protected and her short  external rotators isolated off the femur.  Capsulectomy is performed and  the hip is dislocated.  The center of the femoral head is marked and a  trial prosthesis is placed such that the center of the trial head  corresponds to the center of the native femoral head.  Osteotomy lines  were marked on the femoral neck and osteotomy made with an  oscillating  saw.  The femoral head was removed and the femur retracted anteriorly to  gain acetabular exposure.   Acetabular retractors were placed and labrum and osteophytes removed.  Acetabular reaming starts at 45 mm in coursing increments of 2-51 mm and  a 52 mm Pinnacle acetabular shell was placed in anatomic position and  transfixed with two dome screws with excellent purchase.  The trial 32  mm neutral +4 liner is placed.   The femur is prepared with the canal finder and irrigation.  Axial  reaming is performed to 13.5 mm, proximal reaming to 34F and the sleeve  machined to a small.  An 34F small trial sleeve is placed and an 18 x 13  stem and 36 standard neck matching her native anteversion.  The 32 +0  head is placed and the hip is reduced with outstanding stability.  She  has full extension, full external rotation, 70 degrees of flexion, 40  degrees of adduction and 90 degrees of internal rotation, 90 degrees of  flexion and 70 degrees of internal  rotation.  By placing the right leg  on top of the left it feels as though the leg lengths were equal.  The  hip was then dislocated.  All trials were removed.  The permanent apex  hole eliminator was placed in the acetabular shell and permanent 32 mm  neutral +4 Marathon liner placed.  On the femoral side we did the 51F  small sleeve and an 18 x 13 stem and 36 standard neck matching native  anteversion.  A 32 +0 head is placed and the hip was reduced with the  same stability parameters.  The wound was copiously irrigated with  saline solution and short rotators reattached to the femur through drill  holes.  Fascia lata was closed over Hemovac drain with interrupted #1  Vicryl, subcu closed with #1 and 2-0 Vicryl and subcuticular running 4-0  Monocryl.  The drain was hooked to suction.  Incision cleaned and dried  and Steri-Strips and a bulky sterile dressing applied.  She was then  placed into a knee immobilizer, awakened and  transported to recovery in  stable condition.      Gaynelle Arabian, M.D.  Electronically Signed     FA/MEDQ  D:  11/10/2007  T:  11/10/2007  Job:  [id number]

## 2010-06-16 NOTE — Discharge Summary (Signed)
NAME:  Penny, KOFOED NO.:  Perez   MEDICAL RECORD NO.:  [id number]          PATIENT TYPE:  INP   LOCATION:  1616                         FACILITY:  [facility]   PHYSICIAN:  Gaynelle Arabian, M.D.    DATE OF BIRTH:  1932/12/04   DATE OF ADMISSION:  11/10/2007  DATE OF DISCHARGE:  11/14/2007                               DISCHARGE SUMMARY   ADMISSION DIAGNOSES:  1. Osteoarthritis, right hip.  2. Shingles.  3. Cataracts.  4. Asthma.  5. Hypertension.  6. Mild reflux.  7. Hypothyroidism.  8. Fibromyalgia.  9. Degenerative disk disease.  10.Eczema.   DISCHARGE DIAGNOSES:  1. Osteoarthritis, right hip, status post right total hip replacement      arthroplasty.  2. Postoperative acute blood loss anemia.  3. Status post transfusion without sequelae.  4. Hyponatremia, improved.  5. Initially hyperkalemia, improved.  6. Osteoarthritis, right hip.  7. Shingles.  8. Cataracts.  9. Asthma.  10.Hypertension.  11.Mild reflux.  12.Hypothyroidism.  13.Fibromyalgia.  14.Degenerative disk disease.  15.Eczema.  16.Postoperative mild renal insufficiency, improved.   PROCEDURE:  Right total hip. Surgeon Dr. Wynelle Link; assistant Arlee Muslim, P.A.-C. Anesthesia general.   CONSULTS:  None.   BRIEF HISTORY:  Ms. Rebollar is a 77 year old female with end-stage  arthritis of the right hip with progressively worsening pain and  dysfunction. Had a remote left hip arthroplasty which has been  successful. Now presents for a right total hip arthroplasty.   LABORATORY DATA:  Preoperative CBC showed a hemoglobin low at 10.4,  hematocrit 31.3, white cell count 5.7, platelets 282. Postoperative  hemoglobin 8.1. Post-transfusion hemoglobin back up to 11.1. Last noted  H and H 10.6 and 31. PT/PTT preoperatively 13.4 and 30, respectively.  INR 1. Serial pro times followed; last noted PT/INR 21.2 and 1.7. Chem  panel on admission:  Slightly elevated creatinine on admission of 1.5.  Main Chem panel within normal limits. Serial BMETs were followed. Sodium  has dropped from 137 to 129, back up to 131. Potassium went up from 4.5  to 5.8, back down to a normal level of 3.9; last noted to be 3.4.  Creatinine went up from 1.5 to 2, back down to 1.1. Glucose went up from  94 to 154, back down to 102. Preoperative UA:  Small leukocyte esterase,  rare epithelial, 0-2 white cells, blood group type B positive.   EKG November 06, 2007:  Normal sinus rhythm, low-voltage QRS, possible  septal infract age undetermined; no old tracings to compare; confirmed  with Dr. Percival Spanish. Chest x-ray dated October 30, 2007:  Subsegmental  atelectasis versus scarring in the lungs, scoliosis.   HOSPITAL COURSE:  The patient admitted to the [facility],  tolerated the procedure well, later transferred to the recovery room and  then the orthopedic floor. Started on PCA and p.o. analgesic pain  control, following surgery, given 24-hour postoperative IV antibiotics.  Was hurting on the evening of surgery and the morning of day #1, and  continued the PCA. Hemoglobin was down to 8.1, so we transfused 2 units  of blood. She had a little bit of  increase in her potassium, 5.8, did  not know if it was hemolysis, rechecked it. She was on oral potassium,  so we held that. Blood pressure was a little soft. We held the blood  pressures on parameters. She had a little bit of mild renal  insufficiency postoperatively. Preoperatively, it was 1.5, went up to 2,  gave her fluids, started back on her home medications. By day #2, her  potassium was back down and creatinine was improving. Still hurting,  encouraged p.o. medications for better pain control. Hemoglobin was back  up to 11.1 after the blood, though. Potassium was back down to a normal  level. Renal insufficiency had improved. Pulse rate was better, and also  her pressure was improved. Started getting up with therapy, walking  about 40 feet and  later 70 feet. Continued to work with PT daily. By day  #3, she had a little bit pain control. Sodium was down a little bit to  131. It was actually up from 129, though. Hemoglobin was stable at 10.6.  Pain was under better control. Incision looked good. Renal insufficiency  continued to improve. Needed one more day, and by November 14, 2007, pain  was under better control, tolerating her medications, and was discharged  home.   DISCHARGE PLAN:  1. Patient discharged to home on November 14, 2007.  2. Discharge diagnoses:  Please see above.  3. Discharge medications:  Percocet, Robaxin, Coumadin.  4. Diet:  Heart healthy.  5. Follow up in 2 weeks.  6. Activity:  She is partial weight bearing 25-50% right lower      extremity. Total hip protocol. Hip precautions. Home health PT.      Home health nursing.   DISPOSITION:  To home.   CONDITION ON DISCHARGE:  Improved.      Alexzandrew L. Perkins, P.A.C.      Gaynelle Arabian, M.D.  Electronically Signed    ALP/MEDQ  D:  12/19/2007  T:  12/19/2007  Job:  [id number]   cc:   Gaynelle Arabian, M.D.  [id number]   Donnamarie Rossetti, M.D.

## 2010-06-16 NOTE — Op Note (Signed)
   NAME:  Penny Perez, Penny Perez                     ACCOUNT NO.:  [id number]   MEDICAL RECORD NO.:  [id number]                   PATIENT TYPE:  AMB   LOCATION:  ENDO                                 FACILITY[facility]  [facility]   PHYSICIAN:  Earle Gell, M.D.                DATE OF BIRTH:  [birth year]   DATE OF PROCEDURE:  03/04/2002  DATE OF DISCHARGE:                                 OPERATIVE REPORT   PROCEDURE:  Esophagogastroduodenoscopy.   INDICATIONS:  The patient is a 77 year old female born September 13, 2032.  The  patient has a sore throat and the sensation of saliva built up in the back  of her throat with excessive sinus drainage.  A recent laryngoscopy  performed by Dr. Ernesto Rutherford revealed inflamed vocal cords.  She denies  dysphagia, odynophagia, or heartburn.   ENDOSCOPIST:  Earle Gell, M.D.   PREMEDICATION:  Versed 7.5 mg, Demerol 50 mg.   ENDOSCOPE:  Olympus gastroscope.   DESCRIPTION OF PROCEDURE:  After obtaining informed consent, the patient was  placed in the left lateral decubitus position.  I administered intravenous  Demerol and intravenous Versed to achieve conscious sedation for the  procedure.  The patient's blood pressure, oxygen saturation, and cardiac  rhythm were monitored throughout the procedure and documented in the medical  record.   The Olympus gastroscope was passed through the posterior hypopharynx into  the proximal esophagus without difficulty.  The hypopharynx, larynx, and  vocal cords appeared normal.   Esophagoscopy:  The proximal, mid-, and lower segments of the esophagus  appear normal.  The squamocolumnar junction and esophagogastric junction are  noted at 35 cm from the incisor teeth.  Endoscopically there is no evidence  for the presence of erosive esophagitis, Barrett's esophagus, esophageal  mucosal scarring, or esophageal ulceration.   Gastroscopy:  Retroflexed view of the gastric cardia and fundus was normal.  The diaphragmatic hiatus was only  slightly patulous.  The gastric body,  antrum, and pylorus appeared normal.   Duodenoscopy:  The duodenal bulb and descending duodenum appeared normal.   ASSESSMENT:  Normal esophagogastroduodenoscopy.                                               Earle Gell, M.D.    MJ/MEDQ  D:  03/04/2002  T:  03/04/2002  Job:  [id number]   cc:   Minna Merritts, M.D.  [address]  [place] [address]  Fax: [phone]

## 2010-10-31 ENCOUNTER — Other Ambulatory Visit: Payer: Self-pay | Admitting: Neurosurgery

## 2010-10-31 DIAGNOSIS — M47816 Spondylosis without myelopathy or radiculopathy, lumbar region: Secondary | ICD-10-CM

## 2010-10-31 LAB — CBC
HCT: 24.7 — ABNORMAL LOW
HCT: 31 — ABNORMAL LOW
HCT: 32.1 — ABNORMAL LOW
Hemoglobin: 10.6 — ABNORMAL LOW
Hemoglobin: 11.1 — ABNORMAL LOW
Hemoglobin: 8.1 — ABNORMAL LOW
MCHC: 33.4
MCV: 97.2
Platelets: 243
Platelets: 282
RBC: 3.19 — ABNORMAL LOW
RBC: 3.2 — ABNORMAL LOW
RBC: 3.33 — ABNORMAL LOW
RDW: 13.7
WBC: 10
WBC: 12.5 — ABNORMAL HIGH
WBC: 5.7
WBC: 9.4

## 2010-10-31 LAB — BASIC METABOLIC PANEL
CO2: 24
CO2: 25
Calcium: 7.7 — ABNORMAL LOW
Calcium: 7.8 — ABNORMAL LOW
Calcium: 8 — ABNORMAL LOW
Chloride: 101
Chloride: 99
Creatinine, Ser: 1.54 — ABNORMAL HIGH
Creatinine, Ser: 1.93 — ABNORMAL HIGH
GFR calc Af Amer: 31 — ABNORMAL LOW
GFR calc Af Amer: 33 — ABNORMAL LOW
GFR calc Af Amer: 40 — ABNORMAL LOW
GFR calc non Af Amer: 24 — ABNORMAL LOW
GFR calc non Af Amer: 28 — ABNORMAL LOW
GFR calc non Af Amer: 48 — ABNORMAL LOW
Glucose, Bld: 102 — ABNORMAL HIGH
Glucose, Bld: 154 — ABNORMAL HIGH
Potassium: 3.4 — ABNORMAL LOW
Potassium: 3.9
Potassium: 4.5
Potassium: 5.8 — ABNORMAL HIGH
Sodium: 129 — ABNORMAL LOW
Sodium: 131 — ABNORMAL LOW
Sodium: 131 — ABNORMAL LOW
Sodium: 131 — ABNORMAL LOW
Sodium: 136

## 2010-10-31 LAB — URINALYSIS, ROUTINE W REFLEX MICROSCOPIC
Bilirubin Urine: NEGATIVE
Glucose, UA: NEGATIVE
Hgb urine dipstick: NEGATIVE
Ketones, ur: NEGATIVE
Protein, ur: NEGATIVE
pH: 6

## 2010-10-31 LAB — TYPE AND SCREEN
ABO/RH(D): B POS
Antibody Screen: NEGATIVE

## 2010-10-31 LAB — ABO/RH: ABO/RH(D): B POS

## 2010-10-31 LAB — COMPREHENSIVE METABOLIC PANEL
ALT: 17
AST: 20
Albumin: 3.5
CO2: 27
Calcium: 9.3
GFR calc Af Amer: 40 — ABNORMAL LOW
GFR calc non Af Amer: 33 — ABNORMAL LOW
Sodium: 137

## 2010-10-31 LAB — PROTIME-INR
INR: 1.2
INR: 1.4
Prothrombin Time: 20.8 — ABNORMAL HIGH

## 2010-11-06 ENCOUNTER — Ambulatory Visit
Admission: RE | Admit: 2010-11-06 | Discharge: 2010-11-06 | Disposition: A | Discharge status: Home / Self Care | Payer: Medicare Other | Source: Ambulatory Visit | Attending: Neurosurgery | Admitting: Neurosurgery

## 2010-11-06 DIAGNOSIS — M47816 Spondylosis without myelopathy or radiculopathy, lumbar region: Secondary | ICD-10-CM

## 2010-12-12 ENCOUNTER — Other Ambulatory Visit: Payer: Self-pay | Admitting: Gastroenterology

## 2010-12-12 DIAGNOSIS — R143 Flatulence: Secondary | ICD-10-CM

## 2010-12-12 DIAGNOSIS — R141 Gas pain: Secondary | ICD-10-CM

## 2010-12-15 ENCOUNTER — Ambulatory Visit
Admission: RE | Admit: 2010-12-15 | Discharge: 2010-12-15 | Disposition: A | Discharge status: Home / Self Care | Payer: Medicare Other | Source: Ambulatory Visit | Attending: Gastroenterology | Admitting: Gastroenterology

## 2010-12-15 DIAGNOSIS — R141 Gas pain: Secondary | ICD-10-CM

## 2010-12-15 DIAGNOSIS — R143 Flatulence: Secondary | ICD-10-CM

## 2010-12-20 ENCOUNTER — Other Ambulatory Visit: Payer: Self-pay | Admitting: Gastroenterology

## 2010-12-20 DIAGNOSIS — K862 Cyst of pancreas: Secondary | ICD-10-CM

## 2010-12-26 ENCOUNTER — Other Ambulatory Visit: Payer: Medicare Other

## 2010-12-26 ENCOUNTER — Ambulatory Visit
Admission: RE | Admit: 2010-12-26 | Discharge: 2010-12-26 | Disposition: A | Discharge status: Home / Self Care | Payer: Medicare Other | Source: Ambulatory Visit | Attending: Gastroenterology | Admitting: Gastroenterology

## 2010-12-26 DIAGNOSIS — K862 Cyst of pancreas: Secondary | ICD-10-CM

## 2010-12-26 MED ORDER — GADOBENATE DIMEGLUMINE 529 MG/ML IV SOLN
7.0000 mL | Freq: Once | INTRAVENOUS | Status: AC | PRN
  Administered 2010-12-26: 7 mL via INTRAVENOUS

## 2011-01-16 ENCOUNTER — Other Ambulatory Visit (HOSPITAL_COMMUNITY): Payer: Self-pay | Admitting: Family Medicine

## 2011-01-24 ENCOUNTER — Telehealth: Payer: Self-pay | Admitting: Internal Medicine

## 2011-01-24 NOTE — Telephone Encounter (Signed)
Per CDY: UC or may come in at 9am for depo and poss neb tx.  Called spoke with patient, advised of CDY's recs as stated above.  Pt stated that she does not want to go to an UC and feels she will be unable to come here at 9am for work-in because she will have to wake up at 4:30am in order to get ready and be at [facility] by 9:45am.  Pt asked if she could come any later and stated that she would be fine waiting.  Per CDY, really should be seen by UC but may call after her procedure to see if she can be worked in.  I spoke with patient about this again and she was adamant about not going to an UC or coming to office before her gallbladder procedure.  Pt to call after the procedure and verbalized her understanding to seek emergency help if her symptoms worsen overnight.    Will hold message in Katie's box as she will be working with CDY tomorrow for documentation when patient calls back.

## 2011-01-24 NOTE — Telephone Encounter (Signed)
I spoke with pt and she states she was exposed to cat dander yesterday and now she is experiencing somce sob, chest tightness, lots of wheezing, chest congestion, and dry cough. Pt is concerned bc she is scheduled to have back surgery on 02/07/11. Pt is requesting to come in and be seen tomorrow to have a "shot" to clear her up. Pt states she is due to be at the hospital in the AM at 10 for a gallbladder test and it takes 3 hrs. Please advise Dr. Annamaria Boots, thanks  Allergies  Allergen Reactions  . Ampicillin   . Cephalexin   . Clarithromycin   . Codeine   . Doxycycline   . Penicillins

## 2011-01-25 ENCOUNTER — Ambulatory Visit (INDEPENDENT_AMBULATORY_CARE_PROVIDER_SITE_OTHER): Payer: Medicare Other | Admitting: Internal Medicine

## 2011-01-25 ENCOUNTER — Encounter (HOSPITAL_COMMUNITY)
Admission: RE | Admit: 2011-01-25 | Discharge: 2011-01-25 | Disposition: A | Discharge status: Home / Self Care | Payer: Medicare Other | Source: Ambulatory Visit | Attending: Family Medicine | Admitting: Family Medicine

## 2011-01-25 ENCOUNTER — Encounter: Payer: Self-pay | Admitting: Internal Medicine

## 2011-01-25 VITALS — BP 118/64 | HR 68 | Ht 60.0 in | Wt 160.2 lb

## 2011-01-25 DIAGNOSIS — J40 Bronchitis, not specified as acute or chronic: Secondary | ICD-10-CM

## 2011-01-25 DIAGNOSIS — J309 Allergic rhinitis, unspecified: Secondary | ICD-10-CM

## 2011-01-25 DIAGNOSIS — R109 Unspecified abdominal pain: Secondary | ICD-10-CM | POA: Insufficient documentation

## 2011-01-25 MED ORDER — TECHNETIUM TC 99M MEBROFENIN IV KIT
5.0000 | PACK | Freq: Once | INTRAVENOUS | Status: AC | PRN
  Administered 2011-01-25: 5 via INTRAVENOUS

## 2011-01-25 MED ORDER — METHYLPREDNISOLONE ACETATE 80 MG/ML IJ SUSP
80.0000 mg | Freq: Once | INTRAMUSCULAR | Status: AC
  Administered 2011-01-25: 80 mg via INTRAMUSCULAR

## 2011-01-25 MED ORDER — SINCALIDE 5 MCG IJ SOLR
INTRAMUSCULAR | Status: AC
  Administered 2011-01-25: 1.42 ug via INTRAVENOUS
  Filled 2011-01-25: qty 5

## 2011-01-25 NOTE — Telephone Encounter (Signed)
Pt called again wanting to be worked in per [employer].  Per CDY and Joellen Jersey, have patient come in now.  LMOM TCB x1.

## 2011-01-25 NOTE — Telephone Encounter (Signed)
Pt returned call. Penny Perez  

## 2011-01-26 NOTE — Telephone Encounter (Signed)
Pt came in yesterday to see CDY

## 2011-01-27 ENCOUNTER — Encounter: Payer: Self-pay | Admitting: Internal Medicine

## 2011-01-27 NOTE — Progress Notes (Signed)
Patient ID: Penny Perez, female    DOB: 02/22/1932, 77 y.o.   MRN: [id number]  HPI 64 yoF followed for allergic rhinitis, bronchitis. Last here March 21, 2010- note reviewed. No recent colds. She is doing well with the pollen season and says she only has trouble around cats or people who were in contact with cats. Proair is the only inhaler she finds helps if exposed to cats. She did not find any preventative benefit from using Qvar and nasonex with an aerochamber, and says Qvar made her mouth sore.,  She continues daily Singulair. Denies wheeze or chest tightness from weather or other triggers.  Incidental pain intermittently under left scapula. Hx of degenerative disk disease - directed to her PCP  01/25/11- 78 yoF followed for allergic rhinitis, bronchitis She reported marked rhinitis and some cough after exposure to cats. Asked depo shot before the holiday weekend/ office closed.   Review of Systems See HPI Constitutional:   No-   weight loss, night sweats, fevers, chills, fatigue, lassitude. HEENT:   No-  headaches, difficulty swallowing, tooth/dental problems, sore throat,       +  sneezing, itching, ear ache, nasal congestion, post nasal drip,  CV:  No-   chest pain, orthopnea, PND, swelling in lower extremities, anasarca, dizziness, palpitations Resp: No-   shortness of breath with exertion or at rest.              No-   productive cough,  No non-productive cough,  No- coughing up of blood.              No-   change in color of mucus.  No- wheezing.   Skin: No-   rash or lesions. GI:  No-   heartburn, indigestion, abdominal pain, nausea, vomiting, diarrhea,                 change in bowel habits, loss of appetite GU: MS:  No-   joint pain or swelling.  No- decreased range of motion.  No- back pain. Neuro-     nothing unusual Psych:  No- change in mood or affect. No depression or anxiety.  No memory loss.      Objective:   Physical Exam General- Alert, Oriented,  Affect-appropriate, Distress- none acute Skin- rash-none, lesions- none, excoriation- none Lymphadenopathy- none Head- atraumatic            Eyes- Gross vision intact, PERRLA, conjunctivae clear secretions            Ears- Hearing, canals-normal            Nose- Clear, no-Septal dev, mucus, polyps, erosion, perforation             Throat- Mallampati II , mucosa clear , drainage- none, tonsils- atrophic Neck- flexible , trachea midline, no stridor , thyroid nl, carotid no bruit Chest - symmetrical excursion , unlabored           Heart/CV- RRR , no murmur , no gallop  , no rub, nl s1 s2                           - JVD- none , edema- none, stasis changes- none, varices- none           Lung- clear to P&A, wheeze- none, cough- none , dullness-none, rub- none           Chest wall-  Abd- tender-no, distended-no, bowel sounds-present, HSM- no  Br/ Gen/ Rectal- Not done, not indicated Extrem- cyanosis- none, clubbing, none, atrophy- none, strength- nl Neuro- grossly intact to observation          Assessment & Plan:

## 2011-01-27 NOTE — Patient Instructions (Signed)
Depo-medrol 80 mg IM 

## 2011-01-27 NOTE — Assessment & Plan Note (Signed)
Acute exacerbation after allergy trigger.  Plan - depomedrol. Continue usual treatment as well.

## 2011-01-30 HISTORY — PX: BACK SURGERY: SHX140

## 2011-02-07 ENCOUNTER — Other Ambulatory Visit (HOSPITAL_COMMUNITY): Payer: Medicare Other

## 2011-05-25 ENCOUNTER — Other Ambulatory Visit: Payer: Self-pay | Admitting: Internal Medicine

## 2011-05-29 ENCOUNTER — Ambulatory Visit: Payer: Medicare Other | Admitting: Internal Medicine

## 2011-07-24 ENCOUNTER — Encounter: Payer: Self-pay | Admitting: Internal Medicine

## 2011-07-24 ENCOUNTER — Ambulatory Visit (INDEPENDENT_AMBULATORY_CARE_PROVIDER_SITE_OTHER): Payer: Medicare Other | Admitting: Internal Medicine

## 2011-07-24 VITALS — BP 112/76 | HR 64 | Ht 60.0 in | Wt 163.2 lb

## 2011-07-24 DIAGNOSIS — J209 Acute bronchitis, unspecified: Secondary | ICD-10-CM

## 2011-07-24 DIAGNOSIS — J309 Allergic rhinitis, unspecified: Secondary | ICD-10-CM

## 2011-07-24 DIAGNOSIS — J45909 Unspecified asthma, uncomplicated: Secondary | ICD-10-CM

## 2011-07-24 NOTE — Patient Instructions (Addendum)
Sample Dulera inhaler-   2 puffs through your spacer tube, then rinse mouth well, twice every day  Ok to use the [employer] rescue inhaler if needed  Order- schedule PFT  Dx asthma  Suggest trying allegra/ fexofenadine - antihistamine for helping control the drainage.

## 2011-07-24 NOTE — Progress Notes (Signed)
Patient ID: Penny Perez, female    DOB: 03-Dec-1932, 78 y.o.   MRN: [id number]  HPI 20 yoF followed for allergic rhinitis, bronchitis. Last here March 21, 2010- note reviewed. No recent colds. She is doing well with the pollen season and says she only has trouble around cats or people who were in contact with cats. Proair is the only inhaler she finds helps if exposed to cats. She did not find any preventative benefit from using Qvar and nasonex with an aerochamber, and says Qvar made her mouth sore.,  She continues daily Singulair. Denies wheeze or chest tightness from weather or other triggers.  Incidental pain intermittently under left scapula. Hx of degenerative disk disease - directed to her PCP  01/25/11- 47 yoF followed for allergic rhinitis, bronchitis She reported marked rhinitis and some cough after exposure to cats. Asked depo shot before the holiday weekend/ office closed.   07/24/11- 51 yoF never smoker followed for allergic rhinitis, bronchitis Doing well with breathing as long as she stays away from cats and those with cats She blames even tenuous contact with cats for hoarseness, increased phlegm and wheeze which he notices intermittently. She prefers her pro air rescue inhaler, only using Symbicort and Qvar if she thinks she has been "exposed". She had radon mitigation done on her home.  Review of Systems-See HPI Constitutional:   No-   weight loss, night sweats, fevers, chills, fatigue, lassitude. HEENT:   No-  headaches, difficulty swallowing, tooth/dental problems, sore throat,       +  sneezing, itching, ear ache, nasal congestion, post nasal drip,  CV:  No-   chest pain, orthopnea, PND, swelling in lower extremities, anasarca, dizziness, palpitations Resp: No-   shortness of breath with exertion or at rest.              No-   productive cough,  No non-productive cough,  No- coughing up of blood.              No-   change in color of mucus.  No-recent wheezing.     Skin: No-   rash or lesions. GI:  No-   heartburn, indigestion, abdominal pain, nausea, vomiting,  GU: MS:  No-   joint pain or swelling.  . Neuro-     nothing unusual Psych:  No- change in mood or affect. No depression or anxiety.  No memory loss.  Objective:   Physical Exam General- Alert, Oriented, Affect-appropriate, Distress- none acute. Looks comfortable Skin- rash-none, lesions- none, excoriation- none Lymphadenopathy- none Head- atraumatic            Eyes- Gross vision intact, PERRLA, conjunctivae clear secretions            Ears- Hearing, canals-normal            Nose- Clear, no-Septal dev, mucus, polyps, erosion, perforation             Throat- Mallampati II , mucosa clear , drainage- none, tonsils- atrophic Neck- flexible , trachea midline, no stridor , thyroid nl, carotid no bruit Chest - symmetrical excursion , unlabored           Heart/CV- RRR , no murmur , no gallop  , no rub, nl s1 s2                           - JVD- none , edema- none, stasis changes- none, varices- none  Lung- clear to P&A, wheeze- none, cough- none , dullness-none, rub- none           Chest wall-  Abd- tender-no, distended-no, bowel sounds-present, HSM- no Br/ Gen/ Rectal- Not done, not indicated Extrem- cyanosis- none, clubbing, none, atrophy- none, strength- nl. Osteoarthritis changes in hands. Neuro- grossly intact to observation  Assessment & Plan:

## 2011-07-28 NOTE — Assessment & Plan Note (Signed)
She blames For all of her symptoms but I have no objective verification.

## 2011-07-28 NOTE — Assessment & Plan Note (Signed)
Question if there is an asthma component. Question cyclical cough with reflux. We discussed the use. Plan-PFT, sample Dulera 200, consider Tudorza.

## 2011-09-24 ENCOUNTER — Ambulatory Visit: Payer: Medicare Other | Admitting: Internal Medicine

## 2011-11-01 ENCOUNTER — Ambulatory Visit (INDEPENDENT_AMBULATORY_CARE_PROVIDER_SITE_OTHER): Payer: Medicare Other | Admitting: Internal Medicine

## 2011-11-01 ENCOUNTER — Encounter: Payer: Self-pay | Admitting: Internal Medicine

## 2011-11-01 VITALS — BP 124/76 | HR 65 | Ht 60.0 in | Wt 160.0 lb

## 2011-11-01 DIAGNOSIS — J209 Acute bronchitis, unspecified: Secondary | ICD-10-CM

## 2011-11-01 DIAGNOSIS — J45909 Unspecified asthma, uncomplicated: Secondary | ICD-10-CM

## 2011-11-01 LAB — PULMONARY FUNCTION TEST

## 2011-11-01 MED ORDER — ALBUTEROL SULFATE HFA 108 (90 BASE) MCG/ACT IN AERS: INHALATION_SPRAY | RESPIRATORY_TRACT | Status: AC

## 2011-11-01 MED ORDER — BUDESONIDE-FORMOTEROL FUMARATE 80-4.5 MCG/ACT IN AERO: 2.0000 | INHALATION_SPRAY | Freq: Two times a day (BID) | RESPIRATORY_TRACT | Status: DC

## 2011-11-01 NOTE — Patient Instructions (Addendum)
Refills sent for Proair and Symbicort  Ok to continue Singulair/montelukast  There is a first time for everything, and it would be better to get the flu shot than to get the flu, if you ever change your mind.

## 2011-11-01 NOTE — Progress Notes (Signed)
Patient ID: Penny Perez, female    DOB: 1932/11/21, 78 y.o.   MRN: [id number]  HPI 67 yoF followed for allergic rhinitis, bronchitis. Last here March 21, 2010- note reviewed. No recent colds. She is doing well with the pollen season and says she only has trouble around cats or people who were in contact with cats. Proair is the only inhaler she finds helps if exposed to cats. She did not find any preventative benefit from using Qvar and nasonex with an aerochamber, and says Qvar made her mouth sore.,  She continues daily Singulair. Denies wheeze or chest tightness from weather or other triggers.  Incidental pain intermittently under left scapula. Hx of degenerative disk disease - directed to her PCP  01/25/11- 23 yoF followed for allergic rhinitis, bronchitis She reported marked rhinitis and some cough after exposure to cats. Asked depo shot before the holiday weekend/ office closed.   07/24/11- 67 yoF never smoker followed for allergic rhinitis, bronchitis Doing well with breathing as long as she stays away from cats and those with cats She blames even tenuous contact with cats for hoarseness, increased phlegm and wheeze which he notices intermittently. She prefers her pro air rescue inhaler, only using Symbicort and Qvar if she thinks she has been "exposed". She had radon mitigation done on her home.  11/01/11- 51 yoF never smoker followed for allergic rhinitis, bronchitis Only trouble with breathing is when around cat dander, otherwise has phlegm trapped in throat area all the time; review PFT with patient. She describes choking and getting hoarse intermittently. She then asks people around or if anybody has a cat, she takes a positive answer is confirmation that symptoms are due to have allergy. Dulera did not help. She prefers pro air and Symbicort. Continues omeprazole for GERD. Believe Singulair helps. Declines flu vaccine, arguing that she has never had one. We discussed this. PFT  11/01/2011-within normal limits with slight small airway response to bronchodilator. FEV1/FVC 0.79.  Review of Systems-See HPI Constitutional:   No-   weight loss, night sweats, fevers, chills, fatigue, lassitude. HEENT:   No-  headaches, difficulty swallowing, tooth/dental problems, sore throat,       No recent sneezing, itching, ear ache, nasal congestion, post nasal drip,  CV:  No-   chest pain, orthopnea, PND, swelling in lower extremities, anasarca, dizziness, palpitations Resp: No-   shortness of breath with exertion or at rest.              No-   productive cough,  No non-productive cough,  No- coughing up of blood.              No-   change in color of mucus.  No-recent wheezing.   Skin: No-   rash or lesions. GI:  No-   heartburn, indigestion, abdominal pain, nausea, vomiting,  GU: MS:  No-   joint pain or swelling.  . Neuro-     nothing unusual Psych:  No- change in mood or affect. No depression or anxiety.  No memory loss.  Objective:   Physical Exam General- Alert, Oriented, Affect-appropriate, Distress- none acute. Looks comfortable Skin- rash-none, lesions- none, excoriation- none Lymphadenopathy- none Head- atraumatic            Eyes- Gross vision intact, PERRLA, conjunctivae clear secretions            Ears- Hearing, canals-normal            Nose- Clear, no-Septal dev, mucus, polyps, erosion, perforation  Throat- Mallampati II , mucosa clear , drainage- none, tonsils- atrophic. Voice normal. Neck- flexible , trachea midline, no stridor , thyroid nl, carotid no bruit Chest - symmetrical excursion , unlabored           Heart/CV- RRR , no murmur , no gallop  , no rub, nl s1 s2                           - JVD- none , edema- none, stasis changes- none, varices- none           Lung- clear to P&A, wheeze- none, cough- none , dullness-none, rub- none           Chest wall-  Abd-  Br/ Gen/ Rectal- Not done, not indicated Extrem- cyanosis- none, clubbing, none,  atrophy- none, strength- nl. Osteoarthritis changes in hands. Neuro- grossly intact to observation  Assessment & Plan:

## 2011-11-01 NOTE — Progress Notes (Signed)
PFT done today. 

## 2011-11-10 NOTE — Assessment & Plan Note (Signed)
She blames acute episodes of throat and chest tightness on indirect exposure to cat. I am not convinced this is not a combination of anxiety attack and reflux. Consider methacholine challenge. She says she is satisfied as long as she has recurrent rescue and maintenance inhalers.

## 2012-01-10 ENCOUNTER — Other Ambulatory Visit: Payer: Self-pay | Admitting: Neurosurgery

## 2012-01-10 DIAGNOSIS — M25562 Pain in left knee: Secondary | ICD-10-CM

## 2012-01-10 DIAGNOSIS — M79605 Pain in left leg: Secondary | ICD-10-CM

## 2012-01-17 ENCOUNTER — Ambulatory Visit
Admission: RE | Admit: 2012-01-17 | Discharge: 2012-01-17 | Disposition: A | Discharge status: Home / Self Care | Payer: Medicare Other | Source: Ambulatory Visit | Attending: Neurosurgery | Admitting: Neurosurgery

## 2012-01-17 DIAGNOSIS — M79605 Pain in left leg: Secondary | ICD-10-CM

## 2012-01-17 DIAGNOSIS — M25562 Pain in left knee: Secondary | ICD-10-CM

## 2012-02-22 ENCOUNTER — Encounter (HOSPITAL_BASED_OUTPATIENT_CLINIC_OR_DEPARTMENT_OTHER): Payer: Self-pay | Admitting: *Deleted

## 2012-02-22 NOTE — Progress Notes (Signed)
To come in for bmet and ekg

## 2012-02-25 ENCOUNTER — Encounter (HOSPITAL_BASED_OUTPATIENT_CLINIC_OR_DEPARTMENT_OTHER)
Admission: RE | Admit: 2012-02-25 | Discharge: 2012-02-25 | Disposition: A | Discharge status: Home / Self Care | Payer: Medicare Other | Source: Ambulatory Visit | Attending: Orthopedic Surgery | Admitting: Orthopedic Surgery

## 2012-02-25 ENCOUNTER — Other Ambulatory Visit: Payer: Self-pay

## 2012-02-25 LAB — BASIC METABOLIC PANEL
BUN: 24 mg/dL — ABNORMAL HIGH (ref 6–23)
Chloride: 91 mEq/L — ABNORMAL LOW (ref 96–112)
GFR calc Af Amer: 40 mL/min — ABNORMAL LOW (ref >90)
Potassium: 4.8 mEq/L (ref 3.5–5.1)
Sodium: 127 mEq/L — ABNORMAL LOW (ref 135–145)

## 2012-02-27 ENCOUNTER — Ambulatory Visit (HOSPITAL_BASED_OUTPATIENT_CLINIC_OR_DEPARTMENT_OTHER)
Admission: RE | Admit: 2012-02-27 | Discharge: 2012-02-27 | Disposition: A | Discharge status: Home / Self Care | Payer: Medicare Other | Source: Ambulatory Visit | Attending: Orthopedic Surgery | Admitting: Orthopedic Surgery

## 2012-02-27 ENCOUNTER — Encounter (HOSPITAL_BASED_OUTPATIENT_CLINIC_OR_DEPARTMENT_OTHER): Payer: Self-pay | Admitting: Anesthesiology

## 2012-02-27 ENCOUNTER — Ambulatory Visit (HOSPITAL_BASED_OUTPATIENT_CLINIC_OR_DEPARTMENT_OTHER): Payer: Medicare Other | Admitting: Anesthesiology

## 2012-02-27 ENCOUNTER — Encounter (HOSPITAL_BASED_OUTPATIENT_CLINIC_OR_DEPARTMENT_OTHER)
Admission: RE | Disposition: A | Discharge status: Home / Self Care | Payer: Self-pay | Source: Ambulatory Visit | Attending: Orthopedic Surgery

## 2012-02-27 ENCOUNTER — Encounter (HOSPITAL_BASED_OUTPATIENT_CLINIC_OR_DEPARTMENT_OTHER): Payer: Self-pay | Admitting: *Deleted

## 2012-02-27 DIAGNOSIS — IMO0002 Reserved for concepts with insufficient information to code with codable children: Secondary | ICD-10-CM | POA: Insufficient documentation

## 2012-02-27 DIAGNOSIS — M21619 Bunion of unspecified foot: Secondary | ICD-10-CM | POA: Insufficient documentation

## 2012-02-27 DIAGNOSIS — I10 Essential (primary) hypertension: Secondary | ICD-10-CM | POA: Insufficient documentation

## 2012-02-27 DIAGNOSIS — K219 Gastro-esophageal reflux disease without esophagitis: Secondary | ICD-10-CM | POA: Insufficient documentation

## 2012-02-27 DIAGNOSIS — M2030 Hallux varus (acquired), unspecified foot: Secondary | ICD-10-CM | POA: Insufficient documentation

## 2012-02-27 DIAGNOSIS — M79671 Pain in right foot: Secondary | ICD-10-CM

## 2012-02-27 DIAGNOSIS — J45909 Unspecified asthma, uncomplicated: Secondary | ICD-10-CM | POA: Insufficient documentation

## 2012-02-27 DIAGNOSIS — M6240 Contracture of muscle, unspecified site: Secondary | ICD-10-CM | POA: Insufficient documentation

## 2012-02-27 DIAGNOSIS — N289 Disorder of kidney and ureter, unspecified: Secondary | ICD-10-CM | POA: Insufficient documentation

## 2012-02-27 DIAGNOSIS — Y838 Other surgical procedures as the cause of abnormal reaction of the patient, or of later complication, without mention of misadventure at the time of the procedure: Secondary | ICD-10-CM | POA: Insufficient documentation

## 2012-02-27 DIAGNOSIS — Z01812 Encounter for preprocedural laboratory examination: Secondary | ICD-10-CM | POA: Insufficient documentation

## 2012-02-27 DIAGNOSIS — Z01810 Encounter for preprocedural cardiovascular examination: Secondary | ICD-10-CM | POA: Insufficient documentation

## 2012-02-27 HISTORY — DX: Fibromyalgia: M79.7

## 2012-02-27 HISTORY — DX: Unspecified asthma, uncomplicated: J45.909

## 2012-02-27 HISTORY — PX: TENDON TRANSFER: SHX6109

## 2012-02-27 HISTORY — PX: ORIF TOE FRACTURE: SHX5032

## 2012-02-27 HISTORY — DX: Other cervical disc degeneration, unspecified cervical region: M50.30

## 2012-02-27 LAB — POCT HEMOGLOBIN-HEMACUE: Hemoglobin: 12.8 g/dL (ref 12.0–15.0)

## 2012-02-27 SURGERY — OPEN REDUCTION INTERNAL FIXATION (ORIF) METATARSAL (TOE) FRACTURE
Anesthesia: General | Site: Foot | Laterality: Right | Wound class: Clean

## 2012-02-27 MED ORDER — OXYCODONE-ACETAMINOPHEN 5-325 MG PO TABS: 1.0000 | ORAL_TABLET | ORAL | Status: DC | PRN

## 2012-02-27 MED ORDER — VANCOMYCIN HCL IN DEXTROSE 1-5 GM/200ML-% IV SOLN
1000.0000 mg | Freq: Once | INTRAVENOUS | Status: AC
  Administered 2012-02-27: 1000 mg via INTRAVENOUS

## 2012-02-27 MED ORDER — FENTANYL CITRATE 0.05 MG/ML IJ SOLN
INTRAMUSCULAR | Status: DC | PRN
  Administered 2012-02-27 (×2): 50 ug via INTRAVENOUS

## 2012-02-27 MED ORDER — PROPOFOL 10 MG/ML IV BOLUS
INTRAVENOUS | Status: DC | PRN
  Administered 2012-02-27: 180 mg via INTRAVENOUS

## 2012-02-27 MED ORDER — MIDAZOLAM HCL 2 MG/ML PO SYRP: 0.5000 mg/kg | ORAL_SOLUTION | Freq: Once | ORAL | Status: DC | PRN

## 2012-02-27 MED ORDER — OXYCODONE HCL 5 MG/5ML PO SOLN: 5.0000 mg | Freq: Once | ORAL | Status: AC | PRN

## 2012-02-27 MED ORDER — FENTANYL CITRATE 0.05 MG/ML IJ SOLN: 50.0000 ug | INTRAMUSCULAR | Status: DC | PRN

## 2012-02-27 MED ORDER — MIDAZOLAM HCL 2 MG/2ML IJ SOLN: 1.0000 mg | INTRAMUSCULAR | Status: DC | PRN

## 2012-02-27 MED ORDER — BUPIVACAINE HCL (PF) 0.5 % IJ SOLN
INTRAMUSCULAR | Status: DC | PRN
  Administered 2012-02-27: 10 mL

## 2012-02-27 MED ORDER — ONDANSETRON HCL 4 MG/2ML IJ SOLN
INTRAMUSCULAR | Status: DC | PRN
  Administered 2012-02-27: 4 mg via INTRAVENOUS

## 2012-02-27 MED ORDER — ONDANSETRON HCL 4 MG/2ML IJ SOLN: 4.0000 mg | Freq: Four times a day (QID) | INTRAMUSCULAR | Status: DC | PRN

## 2012-02-27 MED ORDER — METHOCARBAMOL 500 MG PO TABS: 500.0000 mg | ORAL_TABLET | Freq: Three times a day (TID) | ORAL | Status: DC

## 2012-02-27 MED ORDER — CHLORHEXIDINE GLUCONATE 4 % EX LIQD: 60.0000 mL | Freq: Once | CUTANEOUS | Status: DC

## 2012-02-27 MED ORDER — SODIUM CHLORIDE 0.9 % IV SOLN: INTRAVENOUS | Status: DC

## 2012-02-27 MED ORDER — ENOXAPARIN SODIUM 30 MG/0.3ML ~~LOC~~ SOLN: 30.0000 mg | Freq: Two times a day (BID) | SUBCUTANEOUS | Status: DC

## 2012-02-27 MED ORDER — LIDOCAINE HCL (CARDIAC) 20 MG/ML IV SOLN
INTRAVENOUS | Status: DC | PRN
  Administered 2012-02-27: 50 mg via INTRAVENOUS

## 2012-02-27 MED ORDER — LACTATED RINGERS IV SOLN
INTRAVENOUS | Status: DC
  Administered 2012-02-27 (×3): via INTRAVENOUS

## 2012-02-27 MED ORDER — HYDROMORPHONE HCL PF 1 MG/ML IJ SOLN
0.2500 mg | INTRAMUSCULAR | Status: DC | PRN
  Administered 2012-02-27 (×4): 0.5 mg via INTRAVENOUS

## 2012-02-27 MED ORDER — DEXAMETHASONE SODIUM PHOSPHATE 4 MG/ML IJ SOLN
INTRAMUSCULAR | Status: DC | PRN
  Administered 2012-02-27: 4 mg via INTRAVENOUS

## 2012-02-27 MED ORDER — FENTANYL CITRATE 0.05 MG/ML IJ SOLN
25.0000 ug | INTRAMUSCULAR | Status: DC | PRN
  Administered 2012-02-27 (×3): 25 ug via INTRAVENOUS

## 2012-02-27 MED ORDER — OXYCODONE HCL 5 MG PO TABS
5.0000 mg | ORAL_TABLET | Freq: Once | ORAL | Status: AC | PRN
  Administered 2012-02-27: 5 mg via ORAL

## 2012-02-27 SURGICAL SUPPLY — 90 items
APL SKNCLS STERI-STRIP NONHPOA (GAUZE/BANDAGES/DRESSINGS) ×1
BANDAGE ELASTIC 4 VELCRO ST LF (GAUZE/BANDAGES/DRESSINGS) ×2 IMPLANT
BANDAGE ELASTIC 6 VELCRO ST LF (GAUZE/BANDAGES/DRESSINGS) ×1 IMPLANT
BENZOIN TINCTURE PRP APPL 2/3 (GAUZE/BANDAGES/DRESSINGS) ×1 IMPLANT
BIT DRILL 1.7 LOW PROFILE (BIT) ×1 IMPLANT
BIT DRILL 2.3 ARTHO (DRILL) ×1 IMPLANT
BIT DRILL SOLID 3.5X110 (BIT) ×1 IMPLANT
BIT DRILL SOLID 4.5X145 (BIT) ×1 IMPLANT
BLADE AVERAGE 25X9 (BLADE) IMPLANT
BLADE SURG 11 STRL SS (BLADE) ×1 IMPLANT
BLADE SURG 15 STRL LF DISP TIS (BLADE) ×4 IMPLANT
BLADE SURG 15 STRL SS (BLADE) ×6
BRUSH SCRUB EZ PLAIN DRY (MISCELLANEOUS) ×2 IMPLANT
CAP PIN ORTHO PINK (CAP) ×1 IMPLANT
CLOTH BEACON ORANGE TIMEOUT ST (SAFETY) ×2 IMPLANT
COTTON STERILE ROLL (GAUZE/BANDAGES/DRESSINGS) ×1 IMPLANT
COVER TABLE BACK 60X90 (DRAPES) ×2 IMPLANT
CUFF TOURNIQUET SINGLE 34IN LL (TOURNIQUET CUFF) ×2 IMPLANT
DRAPE EXTREMITY T 121X128X90 (DRAPE) ×2 IMPLANT
DRAPE OEC MINIVIEW 54X84 (DRAPES) ×2 IMPLANT
DRAPE SURG 17X23 STRL (DRAPES) ×2 IMPLANT
DRSG PAD ABDOMINAL 8X10 ST (GAUZE/BANDAGES/DRESSINGS) ×2 IMPLANT
DURA STEPPER LG (CAST SUPPLIES) IMPLANT
DURA STEPPER MED (CAST SUPPLIES) IMPLANT
DURA STEPPER SML (CAST SUPPLIES) ×1 IMPLANT
ELECT REM PT RETURN 9FT ADLT (ELECTROSURGICAL) ×2
ELECTRODE REM PT RTRN 9FT ADLT (ELECTROSURGICAL) ×1 IMPLANT
FIBERLOOP 2 0 (SUTURE) ×1 IMPLANT
GAUZE SPONGE 4X4 16PLY XRAY LF (GAUZE/BANDAGES/DRESSINGS) IMPLANT
GAUZE XEROFORM 1X8 LF (GAUZE/BANDAGES/DRESSINGS) ×1 IMPLANT
GAUZE XEROFORM 5X9 LF (GAUZE/BANDAGES/DRESSINGS) IMPLANT
GLOVE BIO SURGEON STRL SZ 6.5 (GLOVE) ×1 IMPLANT
GLOVE BIO SURGEON STRL SZ8 (GLOVE) ×2 IMPLANT
GLOVE BIOGEL PI IND STRL 7.0 (GLOVE) IMPLANT
GLOVE BIOGEL PI IND STRL 8 (GLOVE) ×2 IMPLANT
GLOVE BIOGEL PI INDICATOR 7.0 (GLOVE) ×1
GLOVE BIOGEL PI INDICATOR 8 (GLOVE) ×2
GLOVE SURG SS PI 8.0 STRL IVOR (GLOVE) ×2 IMPLANT
GOWN BRE IMP PREV XXLGXLNG (GOWN DISPOSABLE) ×2 IMPLANT
GOWN PREVENTION PLUS XLARGE (GOWN DISPOSABLE) ×2 IMPLANT
GOWN PREVENTION PLUS XXLARGE (GOWN DISPOSABLE) ×1 IMPLANT
K-WIRE .062X4 (WIRE) ×1 IMPLANT
KIT MINI BIO ANCHOR DRILL (KITS) IMPLANT
NDL SUT 6 .5 CRC .975X.05 MAYO (NEEDLE) IMPLANT
NEEDLE HYPO 22GX1.5 SAFETY (NEEDLE) ×1 IMPLANT
NEEDLE MAYO TAPER (NEEDLE) ×2
NS IRRIG 1000ML POUR BTL (IV SOLUTION) ×2 IMPLANT
PACK BASIN DAY SURGERY FS (CUSTOM PROCEDURE TRAY) ×2 IMPLANT
PAD CAST 4YDX4 CTTN HI CHSV (CAST SUPPLIES) ×1 IMPLANT
PADDING CAST ABS 4INX4YD NS (CAST SUPPLIES) ×1
PADDING CAST ABS COTTON 4X4 ST (CAST SUPPLIES) ×1 IMPLANT
PADDING CAST COTTON 4X4 STRL (CAST SUPPLIES) ×2
PASSER SUT SWANSON 36MM LOOP (INSTRUMENTS) ×1 IMPLANT
PENCIL BUTTON HOLSTER BLD 10FT (ELECTRODE) ×2 IMPLANT
SCREW BIOCOM TENODESIS 3.8X8M (Screw) ×1 IMPLANT
SCREW CORTICAL 2.5X26MM (Screw) ×1 IMPLANT
SCREW LP CORTICAL 2.3X22MM (Screw) ×1 IMPLANT
SHEET MEDIUM DRAPE 40X70 STRL (DRAPES) ×4 IMPLANT
SLEEVE SCD COMPRESS KNEE MED (MISCELLANEOUS) IMPLANT
SPLINT FAST PLASTER 5X30 (CAST SUPPLIES) ×20
SPLINT PLASTER CAST FAST 5X30 (CAST SUPPLIES) ×20 IMPLANT
SPONGE GAUZE 4X4 12PLY (GAUZE/BANDAGES/DRESSINGS) ×2 IMPLANT
STOCKINETTE 6  STRL (DRAPES) ×1
STOCKINETTE 6 STRL (DRAPES) ×1 IMPLANT
STRIP CLOSURE SKIN 1/2X4 (GAUZE/BANDAGES/DRESSINGS) ×1 IMPLANT
SUCTION FRAZIER TIP 10 FR DISP (SUCTIONS) ×2 IMPLANT
SUT 2 FIBERLOOP 20 STRT BLUE (SUTURE)
SUT ETHIBOND 2 OS 4 DA (SUTURE) IMPLANT
SUT ETHILON 4 0 PS 2 18 (SUTURE) ×4 IMPLANT
SUT ETHILON 5 0 PS 2 18 (SUTURE) IMPLANT
SUT ETHILON 6 0 P 1 (SUTURE) IMPLANT
SUT FIBERWIRE #2 38 T-5 BLUE (SUTURE)
SUT FIBERWIRE 2-0 18 17.9 3/8 (SUTURE)
SUT MNCRL AB 4-0 PS2 18 (SUTURE) ×1 IMPLANT
SUT VIC AB 2-0 PS2 27 (SUTURE) IMPLANT
SUT VIC AB 2-0 SH 27 (SUTURE)
SUT VIC AB 2-0 SH 27XBRD (SUTURE) IMPLANT
SUT VIC AB 3-0 FS2 27 (SUTURE) IMPLANT
SUT VIC AB 3-0 PS1 18 (SUTURE) ×2
SUT VIC AB 3-0 PS1 18XBRD (SUTURE) ×1 IMPLANT
SUTURE 2 FIBERLOOP 20 STRT BLU (SUTURE) IMPLANT
SUTURE FIBERWR #2 38 T-5 BLUE (SUTURE) IMPLANT
SUTURE FIBERWR 2-0 18 17.9 3/8 (SUTURE) IMPLANT
SYR BULB 3OZ (MISCELLANEOUS) ×2 IMPLANT
SYR CONTROL 10ML LL (SYRINGE) ×1 IMPLANT
TOWEL OR 17X24 6PK STRL BLUE (TOWEL DISPOSABLE) ×3 IMPLANT
TOWEL OR NON WOVEN STRL DISP B (DISPOSABLE) ×2 IMPLANT
TUBE CONNECTING 20X1/4 (TUBING) ×4 IMPLANT
UNDERPAD 30X30 INCONTINENT (UNDERPADS AND DIAPERS) ×2 IMPLANT
WATER STERILE IRR 1000ML POUR (IV SOLUTION) ×1 IMPLANT

## 2012-02-27 NOTE — Brief Op Note (Signed)
02/27/2012  10:30 AM  PATIENT:  Penny Perez  79 y.o. female  PRE-OPERATIVE DIAGNOSIS:  RIGHT NONUNION 1ST METATARSAL NECK FRACTURE, DORSAL  BUNION TIGHT GASTROC   POST-OPERATIVE DIAGNOSIS:  * No post-op diagnosis entered *  PROCEDURE:  Procedure(s) (LRB) with comments: OPEN REDUCTION INTERNAL FIXATION (ORIF) METATARSAL (TOE) FRACTURE (Right) - RIGHT OPEN REDUCTION INTERNAL FIXATION NON UNION 1ST METATARSAL NECK,FLEXOR HALLUCIS LONGUS TO PROXIMAL PHALANAX TENDON TRANSFER,  GASTROC SLIDE,  LOCAL BONE GRAFT  TENDON TRANSFER (Right)  SURGEON:  Surgeon(s) and Role:    * Colin Rhein, MD - Primary  PHYSICIAN ASSISTANT: Benita Stabile, PAC   ASSISTANTS: Benita Stabile, PAC    ANESTHESIA:   general  EBL:     BLOOD ADMINISTERED:none  DRAINS: none   LOCAL MEDICATIONS USED:  MARCAINE     SPECIMEN:  No Specimen  DISPOSITION OF SPECIMEN:  N/A  COUNTS:  YES  TOURNIQUET:  * Missing tourniquet times found for documented tourniquets in log:  [id number] *  DICTATION: .Other Dictation: Dictation Number [id number]  PLAN OF CARE: Discharge to home after PACU  PATIENT DISPOSITION:  PACU - hemodynamically stable.   Delay start of Pharmacological VTE agent (>24hrs) due to surgical blood loss or risk of bleeding: no

## 2012-02-27 NOTE — Anesthesia Postprocedure Evaluation (Signed)
  Anesthesia Post-op Note  Patient: Penny Perez  Procedure(s) Performed: Procedure(s) (LRB) with comments: OPEN REDUCTION INTERNAL FIXATION (ORIF) METATARSAL (TOE) FRACTURE (Right) - RIGHT OPEN REDUCTION INTERNAL FIXATION NON UNION 1ST METATARSAL NECK,FLEXOR HALLUCIS LONGUS TO PROXIMAL PHALANAX TENDON TRANSFER,  GASTROC SLIDE,  LOCAL BONE GRAFT  TENDON TRANSFER (Right)  Patient Location: PACU  Anesthesia Type:General  Level of Consciousness: awake, alert  and oriented  Airway and Oxygen Therapy: Patient Spontanous Breathing and Patient connected to face mask oxygen  Post-op Pain: moderate  Post-op Assessment: Post-op Vital signs reviewed  Post-op Vital Signs: Reviewed  Complications: No apparent anesthesia complications

## 2012-02-27 NOTE — Transfer of Care (Signed)
Immediate Anesthesia Transfer of Care Note  Patient: Penny Perez  Procedure(s) Performed: Procedure(s) (LRB) with comments: OPEN REDUCTION INTERNAL FIXATION (ORIF) METATARSAL (TOE) FRACTURE (Right) - RIGHT OPEN REDUCTION INTERNAL FIXATION NON UNION 1ST METATARSAL NECK,FLEXOR HALLUCIS LONGUS TO PROXIMAL PHALANAX TENDON TRANSFER,  GASTROC SLIDE,  LOCAL BONE GRAFT  TENDON TRANSFER (Right)  Patient Location: PACU  Anesthesia Type:General  Level of Consciousness: sedated  Airway & Oxygen Therapy: Patient Spontanous Breathing and Patient connected to face mask oxygen  Post-op Assessment: Report given to PACU RN and Post -op Vital signs reviewed and stable  Post vital signs: Reviewed and stable  Complications: No apparent anesthesia complications

## 2012-02-27 NOTE — H&P (Signed)
  H&P documentation: Placed to be scanned history and physical exam in chart.  -History and Physical Reviewed  -Patient has been re-examined  -No change in the plan of care  Rosabella Edgin A  

## 2012-02-27 NOTE — Anesthesia Preprocedure Evaluation (Addendum)
Anesthesia Evaluation  Patient identified by MRN, date of birth, ID band Patient awake    Reviewed: Allergy & Precautions, H&P , NPO status , Patient's Chart, lab work & pertinent test results  Airway Mallampati: II  Neck ROM: full    Dental   Pulmonary asthma ,          Cardiovascular hypertension,     Neuro/Psych  Neuromuscular disease    GI/Hepatic GERD-  ,  Endo/Other    Renal/GU Renal InsufficiencyRenal disease     Musculoskeletal  (+) Fibromyalgia -  Abdominal   Peds  Hematology   Anesthesia Other Findings   Reproductive/Obstetrics                          Anesthesia Physical Anesthesia Plan  ASA: III  Anesthesia Plan: General   Post-op Pain Management:    Induction: Intravenous  Airway Management Planned: LMA  Additional Equipment:   Intra-op Plan:   Post-operative Plan:   Informed Consent: I have reviewed the patients History and Physical, chart, labs and discussed the procedure including the risks, benefits and alternatives for the proposed anesthesia with the patient or authorized representative who has indicated his/her understanding and acceptance.     Plan Discussed with: CRNA and Surgeon  Anesthesia Plan Comments:       Anesthesia Quick Evaluation

## 2012-02-27 NOTE — Anesthesia Procedure Notes (Signed)
Procedure Name: LMA Insertion Date/Time: 02/27/2012 10:32 AM Performed by: Lyndee Leo Pre-anesthesia Checklist: Patient identified, Emergency Drugs available, Suction available and Patient being monitored Patient Re-evaluated:Patient Re-evaluated prior to inductionOxygen Delivery Method: Circle System Utilized Preoxygenation: Pre-oxygenation with 100% oxygen Intubation Type: IV induction Ventilation: Mask ventilation without difficulty LMA: LMA inserted LMA Size: 4.0 Number of attempts: 1 Airway Equipment and Method: bite block Placement Confirmation: positive ETCO2 Tube secured with: Tape Dental Injury: Teeth and Oropharynx as per pre-operative assessment

## 2012-02-28 NOTE — Op Note (Signed)
NAMESHAMMARA, GRANDERSON NO.:  [id number]  MEDICAL RECORD NO.:  [id number]  LOCATION:                                 FACILITY:  PHYSICIAN:  Weber Cooks, M.D.     DATE OF BIRTH:  05/11/1932  DATE OF PROCEDURE:  02/27/2012 DATE OF DISCHARGE:                              OPERATIVE REPORT   PREOPERATIVE DIAGNOSES: 1. Nonunited right first metatarsal neck osteotomy. 2. Right dorsal bunion. 3. Right hallux varus. 4. Right tight gastrocnemius.  POSTOPERATIVE DIAGNOSES: 1. Nonunited right first metatarsal neck osteotomy. 2. Right dorsal bunion. 3. Right hallux varus. 4. Right tight gastrocnemius.  OPERATION: 1. Open reduction and internal fixation of right nonunited first     metatarsal osteotomy. 2. Local bone graft. 3. Stress x-rays, right foot. 4. Right first medial MTP joint capsulotomy and adductor hallucis     release. 5. Right gastroc slide. 6. Right FHL to proximal phalanx tendon transfer.  ANESTHESIA:  General.  SURGEON:  Weber Cooks, M.D.  ASSISTANT:  Erskine Emery, P.A.  ESTIMATED BLOOD LOSS:  Minimal.  COMPLICATIONS:  None.  DISPOSITION:  Stable to PR.  INDICATION:  This is a 79 year old female, who underwent an [place] bunionectomy at an outside institution and was left with a nonunion of the right first metatarsal neck, but it was in the flexed position.  She also had a dorsal bunion with a slight hallux varus with a tight gastroc on forefoot metatarsalgia.  She was consented for the above procedure. All risks, infection, nerve or vessel injury, ischemia, and AVN of the first metatarsal head, wound healing problems, new deformity to the great toe, the fact that the toe may be stiff, persistent pain, worsening pain, prolonged recovery, stiffness, arthritis, and the possibility of future fusion of the great toe MTP joint, possibility of a future EHL tendon lengthening, hardware irritation and hardware failure were all explained.   Questions were encouraged and answered.  OPERATION:  The patient was brought to the operating room and placed in supine position after adequate general anesthesia administered as well as Ancef 1 g IV piggyback.  The right lower extremity was then prepped and draped in sterile manner over proximally placed thigh tourniquet. Once the right lower extremity was prepped and draped in sterile manner, longitudinal incision over the medial aspect of the gastrocnemius musculotendinous junction was then made.  Dissection was carried down through the skin.  Hemostasis was obtained.  Fascia was opened in line with this incision.  Conjoined region was then developed to the gastroc soleus.  Soft tissue was elevated off the posterior aspect of the gastrocnemius.  Sural nerve was identified and protected posteriorly throughout the case.  Gastrocnemius was then released with a curved Mayo scissors.  This had extra release of tight gastroc.  The area was copiously irrigated with normal saline.  Subcu was closed with 3-0 Vicryl, skin was closed with 4-0 Monocryl subcuticular stitch.  Steri- Strips were applied.  Limb was then gravity exsanguinated.  Tourniquet was elevated to 290 mmHg.  A longitudinal incision over the medial aspect of the right great toe MTP joint was then made.  Dissection was carried down through the skin.  Hemostasis was obtained.  Medial right great toe MTP joint capsulotomy was then made and a portion of the adductor hallucis was also released as well.  This allowed the toe to reduce beautifully.  We then preserved the soft tissue dorsally throughout the case especially at the first metatarsal head, and did not violate any of the soft tissue laterally as well and dorsally.  We then identified the nonunion site.  This was grossly mobile and the head was mobile as well.  We then debrided the nonunion site and then carefully, anatomically reduced the nonunion.  We then provisionally  fixed this with a K-wire.  We then verified this to be in proper position in AP, lateral and oblique planes through C-arm.  We then placed a 2.3-mm Arthrex screw, this was titanium using a 2.3 followed by a 1.7-mm drill hole respectively, this had excellent purchase and maintenance of the desired position.  This was countersunk.  We then removed the K-wire and placed this with a 2.3-mm fully-threaded cortical lag screw using a 2.3 x 1.7-mm drill hole respectively, this had excellent purchase and maintenance of the desired position, again this was countersunk.  We then obtained stress x-rays in AP, lateral and oblique planes.  This showed no gross motion, fixation, proposition and excellent alignment as well.  Local bone graft obtained from the drill bits throughout the case as well as from spurs locally, were placed into the nonunion site and packed into place as well as a stress strain relieving local bone graft. We then extended the incision distally over the proximal phalanx.  We then dissected plantarly and identified the FHL tendon.  We then made a longitudinal incision over the plantar aspect of the PIP joint and identified the FHL tendon at this point, as well.  We then tenotomized the FDL tendon in this area and pulled it through the wound proximally. We then used a 3.5 followed by 4.5-mm drill hole, and we drilled the hole through the base of the proximal phalanx of the great toe from dorsal to plantar.  We then with a Swanson suture passer after 2-0 FiberLoop was placed in the distal aspect of the FHL tendon, pulled this from plantar to dorsal.  We then properly tensioned this until the toe was in desired position.  We then placed an Arthrex interference cure socket-type bioabsorbable screw that was 3 x 8-mm into the hole and this applied an interference fit.  We then sewed the ends of the tendon to the local tissues distal to the medial capsulotomy with 2-0 FiberWire stitch.   This had an Systems analyst.  We then ranged the toe and the toe was in excellent position.  The hallux varus was also corrected as well.  We then placed a 0.062 K-wire retrograde across the distal proximal phalanx and across the MTP joint with the toe held in the desired position.  We then obtained an x-ray to verify that this was as proper position in the AP and lateral planes.  We then copiously irrigated the area with normal saline.  Tourniquet was inflated.  Hemostasis was obtained.  There was no pulsatile bleeding.  Subcu was closed with 3-0 Vicryl, skin was closed with 4-0 nylon and overall wounds over the foot.  Sterile dressing was applied. CAM walker boot was applied.  The patient was stable to the PR.     Weber Cooks, M.D.     PB/MEDQ  D:  02/27/2012  T:  02/27/2012  Job:  [id number]

## 2012-03-03 ENCOUNTER — Encounter (HOSPITAL_BASED_OUTPATIENT_CLINIC_OR_DEPARTMENT_OTHER): Payer: Self-pay | Admitting: Orthopedic Surgery

## 2012-05-01 ENCOUNTER — Ambulatory Visit: Payer: Medicare Other | Admitting: Internal Medicine

## 2012-05-29 ENCOUNTER — Telehealth: Payer: Self-pay | Admitting: Physician Assistant

## 2012-05-29 MED ORDER — ESTRADIOL 0.025 MG/24HR TD PTTW: 1.0000 | MEDICATED_PATCH | TRANSDERMAL | Status: DC

## 2012-05-29 NOTE — Telephone Encounter (Signed)
Vivelle-Dot 0.025 mg pt needs refill her last office visit 05/09/10 las refill10/23/13 pt states she will make appt to be seen as soon as she can just recently had surgery on right foot and cant drive at this time. Can you refill until she can make appt?

## 2012-05-29 NOTE — Telephone Encounter (Signed)
Rx refilled for two month. Pt must be seen before any further refills

## 2012-10-23 ENCOUNTER — Other Ambulatory Visit: Payer: Self-pay | Admitting: Gastroenterology

## 2012-10-23 DIAGNOSIS — K862 Cyst of pancreas: Secondary | ICD-10-CM

## 2012-10-29 ENCOUNTER — Ambulatory Visit
Admission: RE | Admit: 2012-10-29 | Discharge: 2012-10-29 | Disposition: A | Discharge status: Home / Self Care | Payer: 59 | Source: Ambulatory Visit | Attending: Gastroenterology | Admitting: Gastroenterology

## 2012-10-29 DIAGNOSIS — K862 Cyst of pancreas: Secondary | ICD-10-CM

## 2012-11-05 ENCOUNTER — Ambulatory Visit (INDEPENDENT_AMBULATORY_CARE_PROVIDER_SITE_OTHER): Payer: Medicare Other | Admitting: Physician Assistant

## 2012-11-05 ENCOUNTER — Encounter: Payer: Self-pay | Admitting: Physician Assistant

## 2012-11-05 VITALS — BP 146/86 | HR 64 | Temp 97.8°F | Resp 20 | Ht 58.25 in | Wt 162.0 lb

## 2012-11-05 DIAGNOSIS — Z01419 Encounter for gynecological examination (general) (routine) without abnormal findings: Secondary | ICD-10-CM

## 2012-11-05 NOTE — Progress Notes (Signed)
Patient ID: Cozy Eads MRN: [id number], DOB: [birth year], 79 y.o. Date of Encounter: 11/05/2012, 3:37 PM    Chief Complaint:  Chief Complaint  Patient presents with  . here for PAP    last pap 05/09/10,  had hysterectomy 1979     HPI: 79 y.o. year old white female here for pelvic exam. She was a patient of Dr. Ree Edman for her gynecology care in the past. Because he was seeing patients here for a while she came here for followup today. She says that she sees Dr. Management consultant for her primary care. However because he is a female she was embarrassed to have him do her pelvic exam. She says Dr. Ree Edman was a female but she had gotten use to him doing it and was okay with that in the past but now feels embarrassed to Dr. Melford Aase. She does use a Vivelle-Dot. She says it is expensive so she usually gets the 0.05 dose and cuts it  into one fourth and applies these tiny amount of patches. He has no complaints today.  She just went for a mammogram in September of this year which was negative.      Home Meds: See attached medication section for any medications that were entered at today's visit. The computer does not put those onto this list.The following list is a list of meds entered prior to today's visit.   Current Outpatient Prescriptions on File Prior to Visit  Medication Sig Dispense Refill  . albuterol (PROAIR HFA) 108 (90 BASE) MCG/ACT inhaler 2 puffs every 4 hours if needed- rescue inhaler  1 Inhaler  prn  . budesonide-formoterol (SYMBICORT) 80-4.5 MCG/ACT inhaler Inhale 2 puffs into the lungs 2 (two) times daily. Rinse mouth  1 Inhaler  prn  . Cholecalciferol (VITAMIN D3) 1000 UNITS CAPS Take 1 capsule by mouth daily.        . cromolyn (NASALCROM) 5.2 MG/ACT nasal spray 1 spray by Nasal route 4 (four) times daily as needed.       Marland Kitchen estradiol (VIVELLE-DOT) 0.025 MG/24HR Place 1 patch onto the skin 2 (two) times a week. Apply patch as directed  8 patch  1  . Flaxseed, Linseed, (FLAX SEED OIL)  1000 MG CAPS Take by mouth.        . furosemide (LASIX) 40 MG tablet Take 1 tablet by mouth Once daily as needed.      . gabapentin (NEURONTIN) 300 MG capsule Take 1 capsule by mouth Three times daily as needed.      Marland Kitchen imipramine (TOFRANIL) 50 MG tablet Take 1 tablet by mouth daily.      Marland Kitchen levothyroxine (SYNTHROID, LEVOTHROID) 100 MCG tablet Take 100 mcg by mouth every other day.      . levothyroxine (SYNTHROID, LEVOTHROID) 112 MCG tablet Take 112 mcg by mouth every other day.       . montelukast (SINGULAIR) 10 MG tablet Take 10 mg by mouth at bedtime.        Marland Kitchen olmesartan-hydrochlorothiazide (BENICAR HCT) 20-12.5 MG per tablet 1 tablet. 1/2 tablet once daily      . omeprazole (PRILOSEC) 20 MG capsule Take 40 mg by mouth 2 (two) times daily.       Marland Kitchen oxyCODONE-acetaminophen (ROXICET) 5-325 MG per tablet Take 1-2 tablets by mouth every 4 (four) hours as needed for pain.  40 tablet  0  . potassium chloride (KLOR-CON) 10 MEQ CR tablet Take 10 mEq by mouth daily.        Marland Kitchen Spacer/Aero-Holding  Chambers (AEROCHAMBER MV) inhaler by Other route. Use as instructed       . fish oil-omega-3 fatty acids 1000 MG capsule Take 1 g by mouth daily.        Marland Kitchen loratadine (CLARITIN) 10 MG tablet Take 10 mg by mouth daily.         No current facility-administered medications on file prior to visit.    Allergies:  Allergies  Allergen Reactions  . Ampicillin   . Cephalexin   . Clarithromycin   . Codeine   . Doxycycline   . Penicillins       Review of Systems: See HPI for pertinent ROS. All other ROS negative.    Physical Exam: Blood pressure 146/86, pulse 64, temperature 97.8 F (36.6 C), temperature source Oral, resp. rate 20, height 4' 10.25" (1.48 m), weight 162 lb (73.483 kg)., Body mass index is 33.55 kg/(m^2). General:  Well nourished,  well-developed white female .Appears in no acute distress. Lungs: Clear bilaterally to auscultation without wheezes, rales, or rhonchi. Breathing is  unlabored. Heart: Regular rhythm. No murmurs, rubs, or gallops. Breast exam: She defers breast exam today. Says that she just had a mammogram that was normal. I discussed the chooses to have breast exam in addition to mammogram but she still defers this today. Pelvic Exam: External genitalia normal. Vaginal mucosa does show signs of atrophy but otherwise normal with no lesions or mass. Bimanual exam is normal with no mass or abnormality. No area of tenderness. She has history of hysterectomy and bilateral oophorectomy. Bimanual exam is consistent with this. Msk:  Strength and tone normal for age. Extremities/Skin: Warm and dry. No clubbing or cyanosis. No edema. No rashes or suspicious lesions. Neuro: Alert and oriented X 3. Moves all extremities spontaneously. Gait is normal. CNII-XII grossly in tact. Psych:  Responds to questions appropriately with a normal affect.     ASSESSMENT AND PLAN:  79 y.o. year old female with  1. Visit for pelvic exam She has a history of hysterectomy and bilateral oophorectomy in 1979. She states that the hysterectomy was performed secondary to possible endometriosis. Because she had a family history of ovarian cancer she asked them to go ahead and do bilateral oophorectomy at the time of the surgery as well. She has no personal history of uterine or ovarian cancer. Discussed guidelines regarding Pap smear. She does not need any further Pap smears. Exam today is normal other than some atrophy. Continue Vivelle-Dot.  Discussed need for breast exam but she defers this. She had mammogram September 2014 was negative.   [address] [place], [place], [facility] 11/05/2012 3:37 PM

## 2012-11-26 ENCOUNTER — Telehealth: Payer: Self-pay | Admitting: Family Medicine

## 2012-11-26 NOTE — Telephone Encounter (Signed)
Provider received letter from patients insurance that effective 2015, the Vivelle-Dot 0.025 mg patch will not be a covered drug.  The provider wanted the patient contacted to see if she would be willing to switch to an oral HRT or another patch.  I called patient.  She understood why I was calling.  Does want to stay on a patch but is concerned.  She is worried about blood clots and that was why she is using current med Engineering geologist) Stated it was low risk for clots.  What was the risk for clots with alternative.  If more risk states she is willing to pay for the [place].  She would like to know what provider feels is best.

## 2012-11-27 NOTE — Telephone Encounter (Signed)
The risk of blood clots is going to be the same among these medications.

## 2012-12-11 NOTE — Telephone Encounter (Signed)
Pt aware.

## 2013-05-21 ENCOUNTER — Other Ambulatory Visit: Payer: Self-pay | Admitting: Family Medicine

## 2013-05-21 DIAGNOSIS — R109 Unspecified abdominal pain: Secondary | ICD-10-CM

## 2013-05-21 DIAGNOSIS — K862 Cyst of pancreas: Secondary | ICD-10-CM

## 2013-05-26 ENCOUNTER — Ambulatory Visit
Admission: RE | Admit: 2013-05-26 | Discharge: 2013-05-26 | Disposition: A | Discharge status: Home / Self Care | Payer: 59 | Source: Ambulatory Visit | Attending: Family Medicine | Admitting: Family Medicine

## 2013-05-26 DIAGNOSIS — K862 Cyst of pancreas: Secondary | ICD-10-CM

## 2013-05-26 DIAGNOSIS — R109 Unspecified abdominal pain: Secondary | ICD-10-CM

## 2013-05-26 MED ORDER — GADOBENATE DIMEGLUMINE 529 MG/ML IV SOLN
7.0000 mL | Freq: Once | INTRAVENOUS | Status: AC | PRN
  Administered 2013-05-26: 7 mL via INTRAVENOUS

## 2013-05-28 ENCOUNTER — Other Ambulatory Visit: Payer: Medicare Other

## 2013-06-01 ENCOUNTER — Other Ambulatory Visit: Payer: Self-pay | Admitting: Family Medicine

## 2013-06-01 NOTE — Telephone Encounter (Signed)
Refill appropriate and filled per protocol. 

## 2013-06-03 ENCOUNTER — Telehealth: Payer: Self-pay | Admitting: Family Medicine

## 2013-06-03 NOTE — Telephone Encounter (Signed)
rec'd Pa request for Estradiol patch.  Appropriate PA form for [employer] completed and faxed 06/03/13.

## 2013-06-08 NOTE — Telephone Encounter (Signed)
Received message from [facility], they not handling this client.  Called number on PA form and was transferred to [facility].  Have verbal request started.  Ref# [id number].  &@ hrs turn around expected for answer

## 2013-06-11 NOTE — Telephone Encounter (Signed)
OK 

## 2013-06-11 NOTE — Telephone Encounter (Signed)
Pt called and told insurance will not pay.  Told her provider feels she would be fine to stop taking altogether.  Says she can tell big difference if does not take.  Has terrible hot flashes, gets very constipated.  Understands she on small dose but feels terrible if does not take.  Has been on HRT since 80 yo.  Feels she should not stop.  Gave her number to insurance to call and appeal.  She may be able to answer some of their questions that we can not, since she has been on this long before becoming patient here.

## 2013-06-16 NOTE — Telephone Encounter (Signed)
Called [employer] for appeal on denial of Estradiol Patch.  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  I told them this is not why she takes.  It is used for post hysterectomy hormone replacement.  Although patient is elderly now and we have recommended she stop using, patient states she become very ill if does not use.  Has terrible hot flashes and GI problems.  She spoke to her insurance and gave [place] Ph # [phone] to call insurance to please get approved.  They sent me to appeals department ph # [phone] fax # [phone].  Spoke with representative their and initiated appeal process.  Forms to be completed to be sent and must be returned prior to May 26.

## 2013-06-18 NOTE — Telephone Encounter (Signed)
Appeal for Estrogen patch faxed to insurance after completed by provider

## 2013-06-23 NOTE — Telephone Encounter (Signed)
Received approval for Estradiol Patch from [facility].   Customer ID [id number]  Event Number  [id number]  Aproved for 8 patches in 30 days from 06/08/13 through 06/09/14 Approval faxed to patient's pharmacy

## 2013-06-26 ENCOUNTER — Other Ambulatory Visit (HOSPITAL_COMMUNITY): Payer: Self-pay | Admitting: Family Medicine

## 2013-06-26 DIAGNOSIS — IMO0001 Reserved for inherently not codable concepts without codable children: Secondary | ICD-10-CM

## 2013-06-26 DIAGNOSIS — K219 Gastro-esophageal reflux disease without esophagitis: Secondary | ICD-10-CM

## 2013-06-26 DIAGNOSIS — R1013 Epigastric pain: Secondary | ICD-10-CM

## 2013-07-01 ENCOUNTER — Other Ambulatory Visit (HOSPITAL_COMMUNITY): Payer: Self-pay | Admitting: Family Medicine

## 2013-07-01 ENCOUNTER — Ambulatory Visit (HOSPITAL_COMMUNITY)
Admission: RE | Admit: 2013-07-01 | Discharge: 2013-07-01 | Disposition: A | Discharge status: Home / Self Care | Payer: Medicare Other | Source: Ambulatory Visit | Attending: Family Medicine | Admitting: Family Medicine

## 2013-07-01 DIAGNOSIS — IMO0001 Reserved for inherently not codable concepts without codable children: Secondary | ICD-10-CM

## 2013-07-01 DIAGNOSIS — K219 Gastro-esophageal reflux disease without esophagitis: Secondary | ICD-10-CM

## 2013-07-01 DIAGNOSIS — R1013 Epigastric pain: Secondary | ICD-10-CM

## 2013-07-23 ENCOUNTER — Other Ambulatory Visit: Payer: Self-pay | Admitting: Family Medicine

## 2013-07-23 NOTE — Telephone Encounter (Signed)
Ok to refill??  Last office visit 11/05/2012.  Do not see where medication has been prescribed by [facility].

## 2013-07-23 NOTE — Telephone Encounter (Signed)
Penny Perez, This is the lady who we have had all the insurance coverage issues with regarding her Vivelle Dot.  I am certainly fine with using Estrogen Cream instead--actually, this would be safer option.  But, according to what I saw documented in the phone notes, and what I recall of the situation, I thought that some other type of estrogen had been approved and is what the pt was taking. Also her medication list still shows the Fonda. If she is no longer using Vivelle-Dot or other estrogens but is going to use this estrogen cream , that is fine with me--cna send Rx for Estrogen Cream--but,  please update on the medication list.

## 2013-07-28 NOTE — Telephone Encounter (Signed)
Yes, Refill Approved.!!

## 2013-07-28 NOTE — Telephone Encounter (Signed)
Pt states she only uses this sparingly.  Got last tube in 2013 and just now used the last of it.  When has a vaginal irritation will use small application of this and that takes care of it.  She is by no means using on any regular basis.

## 2013-08-06 ENCOUNTER — Other Ambulatory Visit: Payer: Self-pay | Admitting: Physician Assistant

## 2013-08-14 ENCOUNTER — Other Ambulatory Visit: Payer: Self-pay | Admitting: Orthopaedic Surgery

## 2013-08-14 DIAGNOSIS — M25552 Pain in left hip: Secondary | ICD-10-CM

## 2013-08-22 ENCOUNTER — Ambulatory Visit
Admission: RE | Admit: 2013-08-22 | Discharge: 2013-08-22 | Disposition: A | Discharge status: Home / Self Care | Payer: 59 | Source: Ambulatory Visit | Attending: Orthopaedic Surgery | Admitting: Orthopaedic Surgery

## 2013-08-22 DIAGNOSIS — M25552 Pain in left hip: Secondary | ICD-10-CM

## 2013-10-21 ENCOUNTER — Other Ambulatory Visit: Payer: Self-pay | Admitting: Physician Assistant

## 2013-12-16 ENCOUNTER — Other Ambulatory Visit: Payer: Self-pay | Admitting: Family Medicine

## 2013-12-16 ENCOUNTER — Ambulatory Visit
Admission: RE | Admit: 2013-12-16 | Discharge: 2013-12-16 | Disposition: A | Discharge status: Home / Self Care | Payer: Medicare Other | Source: Ambulatory Visit | Attending: Family Medicine | Admitting: Family Medicine

## 2013-12-16 DIAGNOSIS — R05 Cough: Secondary | ICD-10-CM

## 2013-12-16 DIAGNOSIS — R059 Cough, unspecified: Secondary | ICD-10-CM

## 2013-12-16 DIAGNOSIS — R062 Wheezing: Secondary | ICD-10-CM

## 2014-03-16 ENCOUNTER — Encounter: Payer: Self-pay | Admitting: Family Medicine

## 2014-03-16 ENCOUNTER — Other Ambulatory Visit: Payer: Self-pay | Admitting: Physician Assistant

## 2014-03-16 NOTE — Telephone Encounter (Signed)
Refill has been denied.  Pt has not been seen since 10/2012  Letter sent to make appt

## 2014-03-22 ENCOUNTER — Telehealth: Payer: Self-pay | Admitting: Physician Assistant

## 2014-03-22 ENCOUNTER — Ambulatory Visit (INDEPENDENT_AMBULATORY_CARE_PROVIDER_SITE_OTHER): Payer: Medicare Other | Admitting: Physician Assistant

## 2014-03-22 ENCOUNTER — Encounter: Payer: Self-pay | Admitting: Physician Assistant

## 2014-03-22 VITALS — BP 132/78 | HR 68 | Temp 98.2°F | Resp 18 | Ht <= 58 in | Wt 152.0 lb

## 2014-03-22 DIAGNOSIS — Z79890 Hormone replacement therapy (postmenopausal): Secondary | ICD-10-CM | POA: Diagnosis not present

## 2014-03-22 MED ORDER — ESTRADIOL 0.05 MG/24HR TD PTTW: MEDICATED_PATCH | TRANSDERMAL | Status: DC

## 2014-03-22 NOTE — Progress Notes (Signed)
Patient ID: Penny Perez MRN: [id number], DOB: 05/06/1932, 81 y.o. Date of Encounter: 03/22/2014, 3:11 PM    Chief Complaint:  Chief Complaint  Patient presents with  . Medication Refill    vivelle dot     HPI: 81 y.o. year old white female here for f/u regarding her HRT.  Her last visit with me was 11/05/12 at which time she came in for pelvic exam.  She was a patient of Dr. Ree Edman for her gynecology care in the past. Because he was seeing patients here for a while, she came here for followup 01/2012 and again today.  At her OV with me 10/2012 she reported that she sees Dr. Melford Aase for her primary care.  However because he is a female she was embarrassed to have him do her pelvic exam.  She said Dr. Ree Edman was a female but she had gotten use to him doing it and was okay with that in the past but now feels embarrassed to have Dr. Melford Aase do this. She does use a Vivelle-Dot. She says it is expensive so she usually gets the 0.05 dose and cuts it  into one fourths and applies these tiny amount of patches. 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  Therefore, it works best for her to get this dose of patch and then be able to cut it. She has no complaints today.  She reports that she is continued to have no type of blood clot. Has had no mini stroke or stroke and has had no cardiovascular disease. Has been diagnosed with no cancers.  Her last mammogram was September 2014 which was negative.    She states that her insurance only pays to have one every 2 years.     Home Meds:  Outpatient Prescriptions Prior to Visit  Medication Sig Dispense Refill  . albuterol (PROAIR HFA) 108 (90 BASE) MCG/ACT inhaler 2 puffs every 4 hours if needed- rescue inhaler 1 Inhaler prn  . budesonide-formoterol (SYMBICORT) 80-4.5 MCG/ACT inhaler Inhale 2 puffs into the lungs 2 (two) times daily. Rinse  mouth 1 Inhaler prn  . Cholecalciferol (VITAMIN D3) 1000 UNITS CAPS Take 1 capsule by mouth daily.      . cromolyn (NASALCROM) 5.2 MG/ACT nasal spray 1 spray by Nasal route 4 (four) times daily as needed.     . diazepam (VALIUM) 10 MG tablet Take 1 tablet by mouth as needed.    . fish oil-omega-3 fatty acids 1000 MG capsule Take 1 g by mouth daily.      . Flaxseed, Linseed, (FLAX SEED OIL) 1000 MG CAPS Take by mouth.      . furosemide (LASIX) 40 MG tablet Take 1 tablet by mouth Once daily as needed.    . gabapentin (NEURONTIN) 300 MG capsule Take 1 capsule by mouth Three times daily as needed.    Marland Kitchen levothyroxine (SYNTHROID, LEVOTHROID) 100 MCG tablet Take 100 mcg by mouth every other day.    . levothyroxine (SYNTHROID, LEVOTHROID) 112 MCG tablet Take 112 mcg by mouth every other day.     . loratadine (CLARITIN) 10 MG tablet Take 10 mg by mouth daily.      . montelukast (SINGULAIR) 10 MG tablet Take 10 mg by mouth at bedtime.      Marland Kitchen olmesartan-hydrochlorothiazide (BENICAR HCT) 20-12.5 MG per tablet 1 tablet. 1/2 tablet once daily    .  oxyCODONE-acetaminophen (ROXICET) 5-325 MG per tablet Take 1-2 tablets by mouth every 4 (four) hours as needed for pain. 40 tablet 0  . potassium chloride (KLOR-CON) 10 MEQ CR tablet Take 10 mEq by mouth daily.      Marland Kitchen PREMARIN vaginal cream USE AS DIRECTED 30 g 0  . Spacer/Aero-Holding Chambers (AEROCHAMBER MV) inhaler by Other route. Use as instructed     . estradiol (VIVELLE-DOT) 0.05 MG/24HR patch APPLY EVERY 72 HOURS 8 patch 0  . omeprazole (PRILOSEC) 20 MG capsule Take 40 mg by mouth 2 (two) times daily.     Marland Kitchen imipramine (TOFRANIL) 50 MG tablet Take 1 tablet by mouth daily.     No facility-administered medications prior to visit.     Allergies:  Allergies  Allergen Reactions  . Ampicillin   . Cephalexin   . Clarithromycin   . Codeine   . Doxycycline   . Erythromycin   . Gatifloxacin   . Hydrocodone   . Penicillins       Review of Systems: See  HPI for pertinent ROS. All other ROS negative.    Physical Exam: Blood pressure 132/78, pulse 68, temperature 98.2 F (36.8 C), temperature source Oral, resp. rate 18, height 4\' 9"  (1.448 m), weight 152 lb (68.947 kg)., Body mass index is 32.88 kg/(m^2). General:  Well nourished,  well-developed white female .Appears in no acute distress. Lungs: Clear bilaterally to auscultation without wheezes, rales, or rhonchi. Breathing is unlabored. Heart: Regular rhythm. No murmurs, rubs, or gallops. Breast exam: Symmetrical. No mass. No skin changes. No nipple discharge.  Pelvic Exam: External genitalia normal. Vaginal mucosa does show signs of atrophy but otherwise normal with no lesions or mass. Bimanual exam is normal with no mass or abnormality. No area of tenderness. She has history of hysterectomy and bilateral oophorectomy. Bimanual exam is consistent with this. Msk:  Strength and tone normal for age. Extremities/Skin: Warm and dry.  Neuro: Alert and oriented X 3. Moves all extremities spontaneously. Gait is normal. CNII-XII grossly in tact. Psych:  Responds to questions appropriately with a normal affect.     ASSESSMENT AND PLAN:  81 y.o. year old female with  1. Visit for pelvic exam She has a history of hysterectomy and bilateral oophorectomy in 1979. She states that the hysterectomy was performed secondary to possible endometriosis. Because she had a family history of ovarian cancer she asked them to go ahead and do bilateral oophorectomy at the time of the surgery as well. She has no personal history of uterine or ovarian cancer. Discussed guidelines regarding Pap smear. She does not need any further Pap smears. Exam today is normal other than some atrophy.   She had mammogram September 2014 was negative.  Again today, discussed risk associated with HRT. Discussed that  risk includes,  but is not limited to,-- blood clots including DVT, PE and cardiovascular. Also discussed possible  increased  risk of cancers. She voices understanding regarding these risk but wants to continue the treatment. Says that she has been on this ever since 1979. Says that even with the treatment she has some hot flashes and has problems with her hair and skin being dry and also with constipation and says that all of this gets even worse if she tries to go without the HRT.   [address] [place], [place], [facility] 03/22/2014 3:11 PM

## 2014-03-22 NOTE — Telephone Encounter (Signed)
Pt only comes to see [place] for her GYN care. Was former pt of Dr Ree Edman.  Needs refill of Vivelle dot.  Told needs appt because we have not seen her since Oct of 2014.  Sees Dr Melford Aase at another office who is her PCP for all other care.

## 2014-03-22 NOTE — Telephone Encounter (Signed)
Patient is calling to speak with you urgently about her appt today  [phone]

## 2014-03-25 LAB — HM MAMMOGRAPHY: HM Mammogram: NORMAL

## 2014-03-26 ENCOUNTER — Encounter: Payer: Self-pay | Admitting: Family Medicine

## 2014-03-30 ENCOUNTER — Telehealth: Payer: Self-pay | Admitting: Physician Assistant

## 2014-03-30 DIAGNOSIS — Z79890 Hormone replacement therapy (postmenopausal): Secondary | ICD-10-CM

## 2014-03-30 NOTE — Telephone Encounter (Signed)
Patient is calling to ask you a question about one of her medications not being covered by her insurance and would like to know if there is anything you can do about this  Please call her at [phone]

## 2014-03-30 NOTE — Telephone Encounter (Signed)
Pt can not take generic hormone patch.  Must have name brand.  This year much more expensive.  Wants [place] to submit tier price exception.

## 2014-04-05 NOTE — Telephone Encounter (Signed)
Pt aware of insurance decision

## 2014-04-05 NOTE — Telephone Encounter (Signed)
Insurance will cover Vivelle patches at current tier level.  They have denied any cost adjustment.  Patch approved 03/30/14 - 01/29/15  Event # [id number].  Have left message that patch approved but will have to pay co-pay per her insurance.

## 2014-07-02 ENCOUNTER — Telehealth: Payer: Self-pay | Admitting: Physician Assistant

## 2014-07-02 NOTE — Telephone Encounter (Signed)
Patient is calling to say that another doctor had written an rx for a rash under her breast, she spelled this nystop and would like to know if mary beth could write this for her  [facility]  [phone]

## 2014-07-06 MED ORDER — NYSTATIN 100000 UNIT/GM EX CREA: 1.0000 "application " | TOPICAL_CREAM | Freq: Two times a day (BID) | CUTANEOUS | Status: DC

## 2014-07-06 NOTE — Telephone Encounter (Signed)
Can send Rx for Nystatin Cream

## 2014-07-06 NOTE — Telephone Encounter (Signed)
Script called into pharmacy, tried calling pt no answer

## 2014-07-29 ENCOUNTER — Other Ambulatory Visit: Payer: Self-pay | Admitting: Physician Assistant

## 2014-07-29 NOTE — Telephone Encounter (Signed)
Medication refilled per protocol. 

## 2014-09-09 ENCOUNTER — Telehealth: Payer: Self-pay | Admitting: Physician Assistant

## 2014-09-09 MED ORDER — NYSTATIN 100000 UNIT/GM EX POWD: Freq: Two times a day (BID) | CUTANEOUS | Status: DC

## 2014-09-09 NOTE — Telephone Encounter (Signed)
Patient calling to get refill on nystatin  in powder form if possible  [facility]  [phone] (H)

## 2014-09-09 NOTE — Telephone Encounter (Signed)
Medication refilled per protocol. 

## 2014-12-01 ENCOUNTER — Other Ambulatory Visit: Payer: Self-pay | Admitting: Family Medicine

## 2014-12-01 MED ORDER — NYSTATIN 100000 UNIT/GM EX POWD: Freq: Two times a day (BID) | CUTANEOUS | Status: AC

## 2014-12-01 NOTE — Telephone Encounter (Signed)
Medication refilled per protocol. 

## 2014-12-21 ENCOUNTER — Other Ambulatory Visit: Payer: Self-pay | Admitting: Physician Assistant

## 2014-12-21 NOTE — Telephone Encounter (Signed)
Medication refilled per protocol. 

## 2015-04-27 ENCOUNTER — Other Ambulatory Visit: Payer: Self-pay | Admitting: Physician Assistant

## 2015-04-27 NOTE — Telephone Encounter (Signed)
Medication refilled per protocol. 

## 2015-04-28 ENCOUNTER — Encounter: Payer: Self-pay | Admitting: Family Medicine

## 2015-04-28 ENCOUNTER — Telehealth: Payer: Self-pay | Admitting: Family Medicine

## 2015-04-28 NOTE — Telephone Encounter (Signed)
Pt called, left message that she will need to be seen.  Has been over 1 year.

## 2015-04-28 NOTE — Telephone Encounter (Signed)
Patient is calling to get refill on her vivelle dot, says that insurance is  Not going to pay for this  [phone] (H)

## 2015-05-05 ENCOUNTER — Encounter: Payer: Self-pay | Admitting: Physician Assistant

## 2015-05-05 ENCOUNTER — Ambulatory Visit (INDEPENDENT_AMBULATORY_CARE_PROVIDER_SITE_OTHER): Payer: Medicare Other | Admitting: Physician Assistant

## 2015-05-05 VITALS — BP 144/76 | HR 76 | Temp 98.0°F | Resp 18 | Ht <= 58 in | Wt 156.0 lb

## 2015-05-05 DIAGNOSIS — Z79890 Hormone replacement therapy (postmenopausal): Secondary | ICD-10-CM

## 2015-05-05 MED ORDER — ESTROGENS, CONJUGATED 0.625 MG/GM VA CREA: TOPICAL_CREAM | VAGINAL | Status: DC

## 2015-05-05 MED ORDER — ESTRADIOL 0.05 MG/24HR TD PTTW: MEDICATED_PATCH | TRANSDERMAL | Status: DC

## 2015-05-05 NOTE — Progress Notes (Signed)
Patient ID: Penny Perez MRN: [id number], DOB: 09/13/1932, 82 y.o. Date of Encounter: 05/05/2015, 2:55 PM    Chief Complaint:  Chief Complaint  Patient presents with  . check up for refill hormone patch     HPI: 82 y.o. year old white female here for f/u regarding her HRT.  Her last visit with me was 11/05/12 at which time she came in for pelvic exam.  She was a patient of Dr. Ree Edman for her gynecology care in the past. Because he was seeing patients here for a while, she came here for followup 01/2012 and again today.  At her OV with me 10/2012 she reported that she sees Dr. Melford Aase for her primary care.  However because he is a female she was embarrassed to have him do her pelvic exam.  She said Dr. Ree Edman was a female but she had gotten use to him doing it and was okay with that in the past but now feels embarrassed to have Dr. Melford Aase do this. She does use a Vivelle-Dot. She says it is expensive so she usually gets the 0.05 dose and cuts it  into one fourths and applies these tiny amount of patches. 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  Therefore, it works best for her to get this dose of patch and then be able to cut it. She has no complaints today.  She reports that she is continued to have no type of blood clot. Has had no mini stroke or stroke and has had no cardiovascular disease. Has been diagnosed with no cancers.  Her last mammogram was 03/26/2014 at [facility] reviewed report today--  was negative.    She states that her insurance only pays to have one every 2 years.    At [place] 05/05/2015---She states that she cannot do pelvic exam anymore because she has had bilateral hip replacement and surgery to her back with fusion.  Home Meds:  Outpatient Prescriptions Prior to Visit  Medication Sig Dispense Refill  . albuterol (PROAIR HFA) 108 (90 BASE) MCG/ACT inhaler 2  puffs every 4 hours if needed- rescue inhaler 1 Inhaler prn  . aspirin 81 MG tablet Take 81 mg by mouth.    Marland Kitchen azelastine (ASTELIN) 0.1 % nasal spray USE 1 SPRAY IN EACH NOSTRIL TWICE DAILY AS DIRECTED    . Cholecalciferol (VITAMIN D3) 1000 UNITS CAPS Take 1 capsule by mouth daily.      . cromolyn (NASALCROM) 5.2 MG/ACT nasal spray 1 spray by Nasal route 4 (four) times daily as needed.     . diazepam (VALIUM) 10 MG tablet Take 1 tablet by mouth as needed.    . fish oil-omega-3 fatty acids 1000 MG capsule Take 1 g by mouth daily.      . Flaxseed, Linseed, (FLAX SEED OIL) 1000 MG CAPS Take by mouth.      . fluticasone (FLONASE) 50 MCG/ACT nasal spray USE 1 SPRAY IN EACH NOSTRIL EVERY DAY AS DIRECTED    . furosemide (LASIX) 40 MG tablet Take 1 tablet by mouth Once daily as needed.    . gabapentin (NEURONTIN) 300 MG capsule Take 1 capsule by mouth Three times daily as needed.    Marland Kitchen imipramine (TOFRANIL) 50 MG tablet Take 1 tablet by mouth daily.    Marland Kitchen levothyroxine (SYNTHROID, LEVOTHROID) 100 MCG tablet Take 100 mcg by mouth every other day.    Marland Kitchen  levothyroxine (SYNTHROID, LEVOTHROID) 112 MCG tablet Take 112 mcg by mouth every other day.     . loratadine (CLARITIN) 10 MG tablet Take 10 mg by mouth daily.      . methocarbamol (ROBAXIN) 500 MG tablet Take 500 mg by mouth.    . montelukast (SINGULAIR) 10 MG tablet Take 10 mg by mouth at bedtime.      Marland Kitchen nystatin (MYCOSTATIN) powder Apply topically 2 (two) times daily. To effected areas 30 g 2  . olmesartan-hydrochlorothiazide (BENICAR HCT) 20-12.5 MG per tablet 1 tablet. 1/2 tablet once daily    . omeprazole (PRILOSEC) 40 MG capsule TAKE 1 CAPSULE BY MOUTH TWICE DAILY    . oxyCODONE-acetaminophen (ROXICET) 5-325 MG per tablet Take 1-2 tablets by mouth every 4 (four) hours as needed for pain. 40 tablet 0  . potassium chloride (KLOR-CON) 10 MEQ CR tablet Take 10 mEq by mouth daily.      Marland Kitchen PREMARIN vaginal cream USE AS DIRECTED 30 g 0  . Spacer/Aero-Holding  Chambers (AEROCHAMBER MV) inhaler by Other route. Use as instructed     . VIVELLE-DOT 0.05 MG/24HR patch APPLY 1 PATCH EXTERNALLY TO THE SKIN EVERY 72 HOURS 8 patch 0  . budesonide-formoterol (SYMBICORT) 80-4.5 MCG/ACT inhaler Inhale 2 puffs into the lungs 2 (two) times daily. Rinse mouth 1 Inhaler prn   No facility-administered medications prior to visit.     Allergies:  Allergies  Allergen Reactions  . Ampicillin   . Cephalexin   . Clarithromycin   . Codeine   . Doxycycline   . Erythromycin   . Gatifloxacin   . Hydrocodone   . Penicillins       Review of Systems: See HPI for pertinent ROS. All other ROS negative.    Physical Exam: Blood pressure 144/76, pulse 76, temperature 98 F (36.7 C), temperature source Oral, resp. rate 18, height 4' 8.5" (1.435 m), weight 156 lb (70.761 kg)., Body mass index is 34.36 kg/(m^2). General:  Well nourished,  well-developed white female .Appears in no acute distress. Lungs: Clear bilaterally to auscultation without wheezes, rales, or rhonchi. Breathing is unlabored. Heart: Regular rhythm. No murmurs, rubs, or gallops. Breast exam: Symmetrical. No mass. No skin changes. No nipple discharge.  Pelvic Exam: At Visit 05/05/15 she states that she cannot do pelvic exam anymore as she has had bilateral hip replacements and back surgery with fusion. Msk:  Strength and tone normal for age. Extremities/Skin: Warm and dry.  Neuro: Alert and oriented X 3. Moves all extremities spontaneously. Gait is normal. CNII-XII grossly in tact. Psych:  Responds to questions appropriately with a normal affect.     ASSESSMENT AND PLAN:  82 y.o. year old female with    Hormone replacement therapy (HRT) - estradiol (VIVELLE-DOT) 0.05 MG/24HR patch; APPLY 1 PATCH EXTERNALLY TO THE SKIN EVERY 72 HOURS  Dispense: 8 patch; Refill: 11 - conjugated estrogens (PREMARIN) vaginal cream; USE AS DIRECTED  Dispense: 30 g; Refill: 11 She only uses the vaginal cream as needed  for dryness and irritation and only if she is running out of her Vivelle-Dot.  She has a history of hysterectomy and bilateral oophorectomy in 1979. She states that the hysterectomy was performed secondary to possible endometriosis. Because she had a family history of ovarian cancer she asked them to go ahead and do bilateral oophorectomy at the time of the surgery as well. She has no personal history of uterine or ovarian cancer. At prior visits with her, I Discussed guidelines regarding Pap smear.  She does not need any further Pap smears. She did continue to have pelvic exams with me and those exams have been  normal other than some atrophy. However at visit 05/05/2015 she says there is no way she can get in lithotomy position and have pelvic exam. She has had bilateral hip replacement surgeries and also has had back surgery with fusion and she is now 82 years old.   She had mammogram 03/25/14 was negative.  Again today, discussed risk associated with HRT. Discussed that  risk includes,  but is not limited to,-- blood clots including DVT, PE and cardiovascular. Also discussed possible increased  risk of cancers. She voices understanding regarding these risk but wants to continue the treatment. Says that she has been on this ever since 1979. Says that even with the treatment she has some hot flashes and has problems with her hair and skin being dry and also with constipation and says that all of this gets even worse if she tries to go without the HRT.  As well she is aware of risk that we are not even performing any pelvic exam however I really do not think this is at increased risk if she has had a complete hysterectomy.  Follow-up office visit one year or sooner if needed.   [address] [place], [place], [facility] 05/05/2015 2:55 PM

## 2015-08-16 ENCOUNTER — Other Ambulatory Visit: Payer: Self-pay | Admitting: Neurosurgery

## 2015-08-16 DIAGNOSIS — M47896 Other spondylosis, lumbar region: Secondary | ICD-10-CM

## 2015-08-23 ENCOUNTER — Other Ambulatory Visit: Payer: 59

## 2015-08-23 ENCOUNTER — Other Ambulatory Visit: Payer: Self-pay | Admitting: Neurosurgery

## 2015-08-23 DIAGNOSIS — M47816 Spondylosis without myelopathy or radiculopathy, lumbar region: Secondary | ICD-10-CM

## 2015-08-29 LAB — HM MAMMOGRAPHY: HM Mammogram: NORMAL (ref 0–4)

## 2015-09-01 ENCOUNTER — Encounter: Payer: Self-pay | Admitting: *Deleted

## 2015-09-02 ENCOUNTER — Ambulatory Visit
Admission: RE | Admit: 2015-09-02 | Discharge: 2015-09-02 | Disposition: A | Discharge status: Home / Self Care | Payer: Medicare Other | Source: Ambulatory Visit | Attending: Neurosurgery | Admitting: Neurosurgery

## 2015-09-02 DIAGNOSIS — M47816 Spondylosis without myelopathy or radiculopathy, lumbar region: Secondary | ICD-10-CM

## 2015-09-14 ENCOUNTER — Telehealth: Payer: Self-pay | Admitting: Physician Assistant

## 2015-09-14 ENCOUNTER — Telehealth: Payer: Self-pay | Admitting: Family Medicine

## 2015-09-14 NOTE — Telephone Encounter (Signed)
Patient calling to speak to you regarding her vivelle dot  [phone]

## 2015-09-14 NOTE — Telephone Encounter (Signed)
Trying to get pt's Vivelle dot approved for several month now.  Poor communication with insurance company.  They are calling today and I have called them back.  Have answered all  clinical questions to the best of my abilities per medical records.  Pt has been on this for many years prior to being our patient.  Tells [place] she has terrible ill effects if tries to come off.  Approval has gone to medical review.

## 2015-09-15 NOTE — Telephone Encounter (Signed)
Have finally received a;pproval for pt's Vivelle Dot 0.05 mg/24hr patch.  Event # [id number]  Approved for 1 year 09/13/15 -09/12/16.  Pt called and made aware.  Approval faxed to [facility]

## 2016-03-30 ENCOUNTER — Telehealth: Payer: Self-pay

## 2016-03-30 DIAGNOSIS — Z79890 Hormone replacement therapy (postmenopausal): Secondary | ICD-10-CM

## 2016-03-30 NOTE — Telephone Encounter (Signed)
Received fax from [employer] that [place] dis 0.05mg  is not on th formulary list

## 2016-03-30 NOTE — Telephone Encounter (Signed)
Approved to f/u with prior authorization

## 2016-03-30 NOTE — Telephone Encounter (Signed)
Called and spoke Penny Perez with [facility] and an alternative for Vivelle- Dot is [place]   Patient because she has had bad experiences with the previous patch Vivelle has worked well for her and she prefers to continue to use Vivelle will [employer] to see if patient can stay on the Rx with a prior authorization

## 2016-04-03 MED ORDER — ESTRADIOL 0.05 MG/24HR TD PTTW: MEDICATED_PATCH | TRANSDERMAL | 11 refills | Status: DC

## 2016-04-03 NOTE — Telephone Encounter (Signed)
Spoke with [employer] they will fax over paperwork to get prior authorization started

## 2016-04-05 NOTE — Telephone Encounter (Signed)
Prior authorization started on Vivelle

## 2016-04-10 MED ORDER — ESTRADIOL 0.05 MG/24HR TD PTTW: MEDICATED_PATCH | TRANSDERMAL | 11 refills | Status: DC

## 2016-04-10 NOTE — Telephone Encounter (Signed)
Prior authorization has been approved for [employer]

## 2016-08-07 ENCOUNTER — Other Ambulatory Visit: Payer: Self-pay | Admitting: Gastroenterology

## 2016-08-07 DIAGNOSIS — R1084 Generalized abdominal pain: Secondary | ICD-10-CM

## 2016-08-16 ENCOUNTER — Ambulatory Visit
Admission: RE | Admit: 2016-08-16 | Discharge: 2016-08-16 | Disposition: A | Discharge status: Home / Self Care | Payer: Medicare Other | Source: Ambulatory Visit | Attending: Gastroenterology | Admitting: Gastroenterology

## 2016-08-16 DIAGNOSIS — R1084 Generalized abdominal pain: Secondary | ICD-10-CM

## 2017-03-07 ENCOUNTER — Other Ambulatory Visit: Payer: Self-pay | Admitting: Physician Assistant

## 2017-03-07 DIAGNOSIS — R109 Unspecified abdominal pain: Secondary | ICD-10-CM

## 2017-03-07 DIAGNOSIS — K862 Cyst of pancreas: Secondary | ICD-10-CM

## 2017-03-07 DIAGNOSIS — R634 Abnormal weight loss: Secondary | ICD-10-CM

## 2017-03-16 ENCOUNTER — Other Ambulatory Visit: Payer: 59

## 2017-03-20 ENCOUNTER — Ambulatory Visit
Admission: RE | Admit: 2017-03-20 | Discharge: 2017-03-20 | Disposition: A | Discharge status: Home / Self Care | Payer: Medicare Other | Source: Ambulatory Visit | Attending: Physician Assistant | Admitting: Physician Assistant

## 2017-03-20 DIAGNOSIS — K862 Cyst of pancreas: Secondary | ICD-10-CM

## 2017-03-20 DIAGNOSIS — R109 Unspecified abdominal pain: Secondary | ICD-10-CM

## 2017-03-20 DIAGNOSIS — R634 Abnormal weight loss: Secondary | ICD-10-CM

## 2017-03-20 MED ORDER — IOPAMIDOL (ISOVUE-300) INJECTION 61%
100.0000 mL | Freq: Once | INTRAVENOUS | Status: AC | PRN
  Administered 2017-03-20: 80 mL via INTRAVENOUS

## 2017-07-13 ENCOUNTER — Other Ambulatory Visit: Payer: Self-pay | Admitting: Gastroenterology

## 2017-07-13 DIAGNOSIS — R634 Abnormal weight loss: Secondary | ICD-10-CM

## 2017-07-24 ENCOUNTER — Ambulatory Visit
Admission: RE | Admit: 2017-07-24 | Discharge: 2017-07-24 | Disposition: A | Discharge status: Home / Self Care | Payer: Medicare Other | Source: Ambulatory Visit | Attending: Gastroenterology | Admitting: Gastroenterology

## 2017-07-24 DIAGNOSIS — R634 Abnormal weight loss: Secondary | ICD-10-CM

## 2017-07-24 MED ORDER — GADOBENATE DIMEGLUMINE 529 MG/ML IV SOLN
6.0000 mL | Freq: Once | INTRAVENOUS | Status: AC | PRN
  Administered 2017-07-24: 6 mL via INTRAVENOUS

## 2017-10-27 ENCOUNTER — Emergency Department (HOSPITAL_BASED_OUTPATIENT_CLINIC_OR_DEPARTMENT_OTHER)
Admission: EM | Admit: 2017-10-27 | Discharge: 2017-10-27 | Disposition: A | Discharge status: Home / Self Care | Payer: Medicare Other | Attending: Emergency Medicine | Admitting: Emergency Medicine

## 2017-10-27 ENCOUNTER — Other Ambulatory Visit: Payer: Self-pay

## 2017-10-27 ENCOUNTER — Encounter (HOSPITAL_BASED_OUTPATIENT_CLINIC_OR_DEPARTMENT_OTHER): Payer: Self-pay | Admitting: Emergency Medicine

## 2017-10-27 DIAGNOSIS — Z79899 Other long term (current) drug therapy: Secondary | ICD-10-CM | POA: Diagnosis not present

## 2017-10-27 DIAGNOSIS — Y999 Unspecified external cause status: Secondary | ICD-10-CM | POA: Diagnosis not present

## 2017-10-27 DIAGNOSIS — G8929 Other chronic pain: Secondary | ICD-10-CM

## 2017-10-27 DIAGNOSIS — X58XXXA Exposure to other specified factors, initial encounter: Secondary | ICD-10-CM | POA: Diagnosis not present

## 2017-10-27 DIAGNOSIS — S40011A Contusion of right shoulder, initial encounter: Secondary | ICD-10-CM | POA: Insufficient documentation

## 2017-10-27 DIAGNOSIS — Y939 Activity, unspecified: Secondary | ICD-10-CM | POA: Insufficient documentation

## 2017-10-27 DIAGNOSIS — Y929 Unspecified place or not applicable: Secondary | ICD-10-CM | POA: Diagnosis not present

## 2017-10-27 DIAGNOSIS — S4991XA Unspecified injury of right shoulder and upper arm, initial encounter: Secondary | ICD-10-CM | POA: Diagnosis present

## 2017-10-27 DIAGNOSIS — J45909 Unspecified asthma, uncomplicated: Secondary | ICD-10-CM | POA: Insufficient documentation

## 2017-10-27 DIAGNOSIS — I10 Essential (primary) hypertension: Secondary | ICD-10-CM | POA: Insufficient documentation

## 2017-10-27 DIAGNOSIS — T148XXA Other injury of unspecified body region, initial encounter: Secondary | ICD-10-CM

## 2017-10-27 DIAGNOSIS — M25511 Pain in right shoulder: Secondary | ICD-10-CM

## 2017-10-27 NOTE — ED Provider Notes (Signed)
[facility] EMERGENCY DEPARTMENT Provider Note   CSN: [id number] Arrival date & time: 10/27/17  1557     History   Chief Complaint Chief Complaint  Patient presents with  . Shoulder Injury    HPI Penny Perez is a 84 y.o. female.  HPI  84 year old female comes in with chief complaint of shoulder bruise. Patient has history of osteoarthritis, otherwise she does not have any significant medical history and is not taking any blood thinners.  Patient reports that over the past several weeks she has noted that she has been having shoulder pain.  Right shoulder has been operated on about 4 or 5 years ago, and she has been using it quite a bit this summer while taking care of her house.  She has no new pain over her upper extremity, just a chronic pain over her shoulder.  Patient also notes that she thinks that the back of her shoulder appears to be protruded and she is questioning if her hardware has moved.  Patient was seen at an urgent care and was requested to come to the ER for further evaluation and to assess for blood clots.  Pt has no hx of PE, DVT and denies any exogenous hormone (testosterone / estrogen) use, long distance travels or surgery in the past 6 weeks, active cancer, recent immobilization.  Past Medical History:  Diagnosis Date  . ALLERGIC RHINITIS   . Asthma   . Bronchitis   . DDD (degenerative disc disease), cervical   . Esophageal reflux   . Fibromyalgia   . Osteoarthritis   . Unspecified essential hypertension     Patient Active Problem List   Diagnosis Date Noted  . Hormone replacement therapy (HRT) 03/22/2014  . HYPERTENSION 12/07/2009  . Acute bronchitis 12/07/2009  . ALLERGIC RHINITIS 12/07/2009  . OSTEOARTHRITIS, MULTIPLE JOINTS 12/07/2009  . CAROTID BRUIT, LEFT 12/07/2009    Past Surgical History:  Procedure Laterality Date  . APPENDECTOMY    . BACK SURGERY  2013   lumb fusion  . COLONOSCOPY    . ERCP    . ORIF TOE  FRACTURE  02/27/2012   Procedure: OPEN REDUCTION INTERNAL FIXATION (ORIF) METATARSAL (TOE) FRACTURE;  Surgeon: Colin Rhein, MD;  Location: [facility];  Service: Orthopedics;  Laterality: Right;  RIGHT OPEN REDUCTION INTERNAL FIXATION NON UNION 1ST METATARSAL NECK,FLEXOR HALLUCIS LONGUS TO PROXIMAL PHALANAX TENDON TRANSFER,  GASTROC SLIDE,  LOCAL BONE GRAFT   . SHOULDER ARTHROSCOPY  2011   rt  . TENDON TRANSFER  02/27/2012   Procedure: TENDON TRANSFER;  Surgeon: Colin Rhein, MD;  Location: [facility];  Service: Orthopedics;  Laterality: Right;  . TOTAL ABDOMINAL HYSTERECTOMY    . TOTAL ABDOMINAL HYSTERECTOMY W/ BILATERAL SALPINGOOPHORECTOMY    . TOTAL HIP ARTHROPLASTY  03,09   rt and lt  . TOTAL HIP REVISION       OB History   None      Home Medications    Prior to Admission medications   Medication Sig Start Date End Date Taking? Authorizing Provider  albuterol (PROAIR HFA) 108 (90 BASE) MCG/ACT inhaler 2 puffs every 4 hours if needed- rescue inhaler 11/01/11 05/05/15  Baird Lyons D, MD  aspirin 81 MG tablet Take 81 mg by mouth.    [provider]  azelastine (ASTELIN) 0.1 % nasal spray USE 1 SPRAY IN EACH NOSTRIL TWICE DAILY AS DIRECTED 06/01/13   [provider]  budesonide-formoterol (SYMBICORT) 80-4.5 MCG/ACT inhaler Inhale 2  puffs into the lungs 2 (two) times daily. Rinse mouth 11/01/11 03/22/14  Baird Lyons D, MD  Cholecalciferol (VITAMIN D3) 1000 UNITS CAPS Take 1 capsule by mouth daily.      [provider]  conjugated estrogens (PREMARIN) vaginal cream USE AS DIRECTED 05/05/15   Dena Billet B, PA-C  cromolyn (NASALCROM) 5.2 MG/ACT nasal spray 1 spray by Nasal route 4 (four) times daily as needed.     [provider]  diazepam (VALIUM) 10 MG tablet Take 1 tablet by mouth as needed. 09/04/12   [provider]  estradiol (VIVELLE-DOT) 0.05 MG/24HR patch APPLY 1 PATCH EXTERNALLY TO THE SKIN EVERY 72 HOURS  04/10/16   Dixon, Stanton Kidney B, PA-C  fish oil-omega-3 fatty acids 1000 MG capsule Take 1 g by mouth daily.      [provider]  Flaxseed, Linseed, (FLAX SEED OIL) 1000 MG CAPS Take by mouth.      [provider]  fluticasone (FLONASE) 50 MCG/ACT nasal spray USE 1 SPRAY IN EACH NOSTRIL EVERY DAY AS DIRECTED 01/29/13   [provider]  furosemide (LASIX) 40 MG tablet Take 1 tablet by mouth Once daily as needed. 10/29/11   [provider]  gabapentin (NEURONTIN) 300 MG capsule Take 1 capsule by mouth Three times daily as needed.    [provider]  hyoscyamine (LEVSIN, ANASPAZ) 0.125 MG tablet TAKE 1 TABLET BY MOUTH EVERY 6 HOURS AS NEEDED FOR CRAMPING 08/12/17   [provider]  imipramine (TOFRANIL) 50 MG tablet Take 1 tablet by mouth daily. 10/29/11   [provider]  KLOR-CON M10 10 MEQ tablet TAKE 1 TABLET BY MOUTH EVERY DAY *PT PREFERS KLOR-CON* 09/17/17   [provider]  levothyroxine (SYNTHROID, LEVOTHROID) 100 MCG tablet Take 100 mcg by mouth every other day.    [provider]  levothyroxine (SYNTHROID, LEVOTHROID) 112 MCG tablet Take 112 mcg by mouth every other day.     [provider]  LINZESS 290 MCG CAPS capsule TAKE ONE CAPSULE (290 MCG TOTAL) BY MOUTH DAILY. 10/02/17   [provider]  loratadine (CLARITIN) 10 MG tablet Take 10 mg by mouth daily.      [provider]  methocarbamol (ROBAXIN) 500 MG tablet Take 500 mg by mouth. 11/25/12   [provider]  montelukast (SINGULAIR) 10 MG tablet Take 10 mg by mouth at bedtime.      [provider]  MOVANTIK 25 MG TABS tablet TAKE 1 TABLET BY MOUTH DAILY FOR 30 DAYS. 09/03/17   [provider]  nystatin (MYCOSTATIN) powder Apply topically 2 (two) times daily. To effected areas 12/01/14   Orlena Sheldon, PA-C  olmesartan-hydrochlorothiazide (BENICAR HCT) 20-12.5 MG per tablet 1 tablet. 1/2 tablet once daily    [provider]  omeprazole (PRILOSEC) 40 MG capsule TAKE 1 CAPSULE BY MOUTH TWICE DAILY 01/21/14   [provider]  Oxycodone HCl 10 MG TABS TAKE 1 TABLET (10 MG TOTAL) BY MOUTH EVERY SIX (6) HOURS AS NEEDED FOR PAIN. 09/23/17   [provider]  oxyCODONE-acetaminophen (PERCOCET) 10-325 MG tablet  10/25/17   [provider]  potassium chloride (KLOR-CON) 10 MEQ CR tablet Take 10 mEq by mouth daily.      [provider]  Spacer/Aero-Holding Chambers (AEROCHAMBER MV) inhaler by Other route. Use as instructed     [provider]    Family History Family History  Problem Relation Age of Onset  . Asthma Mother   .  Pancreatic cancer Father   . Ovarian cancer Mother     Social History Social History   Tobacco Use  . Smoking status: Never Smoker  . Smokeless tobacco: Never Used  Substance Use Topics  . Alcohol use: No  . Drug use: No     Allergies   Ampicillin; Cephalexin; Clarithromycin; Codeine; Doxycycline; Erythromycin; Gatifloxacin; Hydrocodone; and Penicillins   Review of Systems Review of Systems  Constitutional: Positive for activity change.  Gastrointestinal: Negative for nausea and vomiting.  Musculoskeletal: Positive for arthralgias and myalgias.  Skin: Positive for rash.  Allergic/Immunologic: Negative for immunocompromised state.  Hematological: Bruises/bleeds easily.     Physical Exam Updated Vital Signs BP (!) 151/67 (BP Location: Right Arm)   Pulse (!) 58   Temp 98.5 F (36.9 C) (Oral)   Resp 18   Ht 5' (1.524 m)   Wt 59.9 kg   SpO2 96%   BMI 25.78 kg/m   Physical Exam  Constitutional: She is oriented to person, place, and time. She appears well-developed.  HENT:  Head: Atraumatic.  Eyes: EOM are normal.  Neck: Normal range of motion. Neck supple.  Cardiovascular: Normal rate.  Pulmonary/Chest: Effort normal.  Abdominal: Bowel sounds are normal.  Musculoskeletal:  There is mild protrusion of the  scapular region.  Gross sensory exam of the shoulder is normal.  Neurological: She is alert and oriented to person, place, and time.  Skin: Skin is warm and dry. Rash noted.  Patient has an ecchymotic lesion that starts at the right shoulder posteriorly, and extends down towards the elbow.  There is no unilateral edema or tenderness to palpation of the right extremity.  2+ radial pulse and range of motion over the elbow and wrist is normal.  Patient is able to abduct and finish forward flexion up to the level of shoulder pain without any difficulty.  Nursing note and vitals reviewed.    ED Treatments / Results  Labs (all labs ordered are listed, but only abnormal results are displayed) Labs Reviewed - No data to display  EKG None  Radiology No results found.  Procedures Procedures (including critical care time)  Medications Ordered in ED Medications - No data to display   Initial Impression / Assessment and Plan / ED Course  I have reviewed the triage vital signs and the nursing notes.  Pertinent labs & imaging results that were available during my care of the patient were reviewed by me and considered in my medical decision making (see chart for details).     84 year old female comes into the ER at the request of urgent care for further evaluation.  She has bruised to her right shoulder that she noticed yesterday.  Patient was concerned that she might have a DVT of the upper extremity and therefore went to the urgent care.  Although patient has an ecchymosis, she has no risk factors for DVT, there is no unilateral swelling or tenderness over the right upper extremity therefore I do not think she has a DVT.  As far as the bruise is concerned, it seems like the focus of the bruises actually in the posterior shoulder region, with gravity just allowing for the dissemination of the ecchymosis.  There is no tenderness in that area to palpation besides the posterior part of the shoulder.   Patient reports bruising easily, but she denies any gingival bleeding, vaginal bleeding, rectal bleeding.  We offered CBC to ensure there was no hematologic issue going on including -however patient is comfortable  being assessed as an outpatient by her own doctor.  Additionally, I do not think a shoulder x-ray is needed.  I do not think her hardware is acutely unstable.  It might be appropriate for her to get physical therapy, and possible more assessment by orthopedics only if she is deteriorating.  Patient is actually comfortable not getting a older x-ray now either.  We will discharge her with request for PCP follow-up.  Final Clinical Impressions(s) / ED Diagnoses   Final diagnoses:  Bruise of muscle  Chronic right shoulder pain    ED Discharge Orders    None       Varney Biles, MD 10/27/17 1757

## 2017-10-27 NOTE — ED Triage Notes (Signed)
Patient states that she had a shoulder replacement to her right shoulder - the patient states denies any injury  - patient states that she thinks it may be a blood clot. Patient states that it hurts worse with movement   Patient denies any chest pain, shortness of breath or Hx of blood clots.

## 2017-10-27 NOTE — Discharge Instructions (Signed)
We think you will need to get a CBC test if you continue to have bruising at different parts of your body.  The primary care doctor can complete that test if needed. Additionally, for your shoulder pain we recommend that you request a physical therapy session.  The physical therapy session will not only teach you on how to improve your posture and to use your right upper extremity, but they also might be able to figure out if the hardware is not working properly.  On our assessment it does not seem like you have any shoulder hardware issues.

## 2017-11-04 ENCOUNTER — Ambulatory Visit (INDEPENDENT_AMBULATORY_CARE_PROVIDER_SITE_OTHER): Payer: Medicare Other | Admitting: Physician Assistant

## 2017-11-04 ENCOUNTER — Ambulatory Visit (INDEPENDENT_AMBULATORY_CARE_PROVIDER_SITE_OTHER): Payer: Medicare Other

## 2017-11-04 ENCOUNTER — Encounter (INDEPENDENT_AMBULATORY_CARE_PROVIDER_SITE_OTHER): Payer: Self-pay | Admitting: Physician Assistant

## 2017-11-04 VITALS — Ht 60.0 in | Wt 132.1 lb

## 2017-11-04 DIAGNOSIS — M25511 Pain in right shoulder: Secondary | ICD-10-CM

## 2017-11-04 NOTE — Progress Notes (Signed)
Office Visit Note   Patient: Penny Perez           Date of Birth: Jan 02, 1933           MRN: [id number] Visit Date: 11/04/2017              Requested by: Chesley Noon, MD [place], [place] [address] PCP: Chesley Noon, MD   Assessment & Plan: Visit Diagnoses:  1. Acute pain of right shoulder     Plan: Reassurance was given that her right shoulder replacement does not appear to have any evidence of hardware failure or loosening on radiographs today on exam she seems to have good overall range of motion and no evidence of infection.  She will follow-up with [place] on as-needed basis.  Follow-Up Instructions: Return if symptoms worsen or fail to improve.   Orders:  Orders Placed This Encounter  Procedures  . XR Shoulder Right   No orders of the defined types were placed in this encounter.     Procedures: No procedures performed   Clinical Data: No additional findings.   Subjective: Chief Complaint  Patient presents with  . Right Shoulder - Pain    HPI Penny Perez returns today for new complaint of right shoulder pain.  She reports that she had a reverse right shoulder by Dr. Onnie Graham some years ago she believes it may have been even 8 to 9 years ago.  She done well until recently.  She states that she may have pulled something in the right shoulder.  She is having pain range of motion.  She is been spraying Roundup a lot over the summer and holding a gallon a half container wonders if this could be related.  She does take oxycodone and OxyContin for chronic pain.  She also has fibromyalgia.  She is had no fevers chills shortness of breath chest pain. Review of Systems Please see HPI otherwise negative  Objective: Vital Signs: Ht 5' (1.524 m)   Wt 132 lb 0.9 oz (59.9 kg)   BMI 25.79 kg/m   Physical Exam  Constitutional: She is oriented to person, place, and time. She appears well-developed and well-nourished. No distress.  Pulmonary/Chest:  Effort normal.  Neurological: She is alert and oriented to person, place, and time.  Skin: She is not diaphoretic.    Ortho Exam Bilateral shoulders internal/external rotation reveals 5 out of 5 strength on the left internal rotation against resistance 4-5 external rotation on the right is 3 out of 5.  She has active forward flexion approximately 120 degrees and bring her to 130 degrees passively.  Gentle range of motion reveals fluid range of motion of the right shoulder.  There is no rashes skin lesions ulcerations erythema about the shoulder.  She has an area of ecchymosis in her triceps region proximally.  She has global tenderness with bilateral biceps triceps region.  She has multiple areas of ecchymosis in the forearms bilaterally.  Biceps strength is 5 out of 5 bilaterally triceps strength 5 out of 5. Specialty Comments:  No specialty comments available.  Imaging: Xr Shoulder Right  Result Date: 11/04/2017 Right shoulder multiple views: Status post reverse right shoulder arthroplasty.  No evidence of hardware loosening.  No acute fractures periprosthetic fractures.  No hardware failure.  Shoulder appears well located.    PMFS History: Patient Active Problem List   Diagnosis Date Noted  . Hormone replacement therapy (HRT) 03/22/2014  . HYPERTENSION 12/07/2009  . Acute bronchitis 12/07/2009  .  ALLERGIC RHINITIS 12/07/2009  . OSTEOARTHRITIS, MULTIPLE JOINTS 12/07/2009  . CAROTID BRUIT, LEFT 12/07/2009   Past Medical History:  Diagnosis Date  . ALLERGIC RHINITIS   . Asthma   . Bronchitis   . DDD (degenerative disc disease), cervical   . Esophageal reflux   . Fibromyalgia   . Osteoarthritis   . Unspecified essential hypertension     Family History  Problem Relation Age of Onset  . Asthma Mother   . Pancreatic cancer Father   . Ovarian cancer Mother     Past Surgical History:  Procedure Laterality Date  . APPENDECTOMY    . BACK SURGERY  2013   lumb fusion  .  COLONOSCOPY    . ERCP    . ORIF TOE FRACTURE  02/27/2012   Procedure: OPEN REDUCTION INTERNAL FIXATION (ORIF) METATARSAL (TOE) FRACTURE;  Surgeon: Colin Rhein, MD;  Location: [facility];  Service: Orthopedics;  Laterality: Right;  RIGHT OPEN REDUCTION INTERNAL FIXATION NON UNION 1ST METATARSAL NECK,FLEXOR HALLUCIS LONGUS TO PROXIMAL PHALANAX TENDON TRANSFER,  GASTROC SLIDE,  LOCAL BONE GRAFT   . SHOULDER ARTHROSCOPY  2011   rt  . TENDON TRANSFER  02/27/2012   Procedure: TENDON TRANSFER;  Surgeon: Colin Rhein, MD;  Location: [facility];  Service: Orthopedics;  Laterality: Right;  . TOTAL ABDOMINAL HYSTERECTOMY    . TOTAL ABDOMINAL HYSTERECTOMY W/ BILATERAL SALPINGOOPHORECTOMY    . TOTAL HIP ARTHROPLASTY  03,09   rt and lt  . TOTAL HIP REVISION     Social History   Occupational History  . Occupation: retired  Tobacco Use  . Smoking status: Never Smoker  . Smokeless tobacco: Never Used  Substance and Sexual Activity  . Alcohol use: No  . Drug use: No  . Sexual activity: Not on file

## 2018-03-05 ENCOUNTER — Telehealth (INDEPENDENT_AMBULATORY_CARE_PROVIDER_SITE_OTHER): Payer: Self-pay | Admitting: Orthopedic Surgery

## 2018-03-05 NOTE — Telephone Encounter (Signed)
Returned call to patient left message to call back  Dr. Marlou Sa patient

## 2018-03-06 ENCOUNTER — Encounter (INDEPENDENT_AMBULATORY_CARE_PROVIDER_SITE_OTHER): Payer: Self-pay | Admitting: Orthopedic Surgery

## 2018-03-06 ENCOUNTER — Ambulatory Visit (INDEPENDENT_AMBULATORY_CARE_PROVIDER_SITE_OTHER): Payer: Medicare Other | Admitting: Orthopedic Surgery

## 2018-03-06 DIAGNOSIS — G8929 Other chronic pain: Secondary | ICD-10-CM | POA: Diagnosis not present

## 2018-03-06 DIAGNOSIS — Z96611 Presence of right artificial shoulder joint: Secondary | ICD-10-CM

## 2018-03-06 DIAGNOSIS — M25511 Pain in right shoulder: Secondary | ICD-10-CM

## 2018-03-06 LAB — CBC WITH DIFFERENTIAL/PLATELET
Absolute Monocytes: 623 cells/uL (ref 200–950)
BASOS ABS: 47 {cells}/uL (ref 0–200)
Basophils Relative: 0.7 %
EOS PCT: 10.2 %
Eosinophils Absolute: 683 cells/uL — ABNORMAL HIGH (ref 15–500)
HEMATOCRIT: 34.3 % — AB (ref 35.0–45.0)
Hemoglobin: 11.2 g/dL — ABNORMAL LOW (ref 11.7–15.5)
LYMPHS ABS: 1514 {cells}/uL (ref 850–3900)
MCH: 27.9 pg (ref 27.0–33.0)
MCHC: 32.7 g/dL (ref 32.0–36.0)
MCV: 85.3 fL (ref 80.0–100.0)
MPV: 9.5 fL (ref 7.5–12.5)
Monocytes Relative: 9.3 %
NEUTROS PCT: 57.2 %
Neutro Abs: 3832 cells/uL (ref 1500–7800)
Platelets: 283 10*3/uL (ref 140–400)
RBC: 4.02 10*6/uL (ref 3.80–5.10)
RDW: 13.4 % (ref 11.0–15.0)
Total Lymphocyte: 22.6 %
WBC: 6.7 10*3/uL (ref 3.8–10.8)

## 2018-03-06 LAB — TIQ-NTM

## 2018-03-06 NOTE — Progress Notes (Signed)
Office Visit Note   Patient: Penny Perez           Date of Birth: 09/02/1932           MRN: [id number] Visit Date: 03/06/2018 Requested by: Chesley Noon, MD [place], [place] [address] PCP: Chesley Noon, MD  Subjective: Chief Complaint  Patient presents with  . Right Shoulder - Pain    HPI: Penny Perez is a patient with right shoulder pain.  She reports pain for a week with no injury.  She was seen at the end of last year with some ecchymosis after carrying heavy items.  Radiographs at that time were normal.  She does not want any new radiographs today.  She wants an MRI scan today.  With the metal in and around the shoulder joint region that would not be helpful.  Reports a constant ache and she is unable to sleep due to the pain.  Pain does radiate down to the right wrist but she denies any neck pain.  She has been taking gabapentin and methocarbamol and is on oxycodone from Dr. Carloyn Manner.  4 years ago she had rotator cuff arthropathy and underwent reverse shoulder replacement done at another facility.  She denies any fevers and chills.  She does report deltoid pain primarily posterior lateral deltoid pain.  Radiates down the arm but not below the elbow and she denies any numbness and tingling.              ROS: All systems reviewed are negative as they relate to the chief complaint within the history of present illness.  Patient denies  fevers or chills.   Assessment & Plan: Visit Diagnoses:  1. Chronic right shoulder pain   2. Presence of right artificial shoulder joint     Plan: Impression is right shoulder pain with radiographs which look pretty good from October.  Patient does not want any radiographs today.  She wants an MRI scan.  Too much metal in the shoulder to make that a meaningful study.  No acromial tenderness so I think acromial stress fracture is unlikely.  Itself is located.  No major issues on that front.  Plan is CBC differential sed rate  C-reactive protein rule out infection and bone scan to wait for bone issues.  Cannot really envision any soft tissue problem with the reverse shoulder that would necessitate any intervention.  Do not want to inject this joint.  I will see her back after that study.  Follow-Up Instructions: Return for after MRI.   Orders:  Orders Placed This Encounter  Procedures  . NM Bone Scan 3 Phase Upper Extremity  . CBC with Differential/Platelet  . Sed Rate (ESR)  . C-reactive protein   No orders of the defined types were placed in this encounter.     Procedures: No procedures performed   Clinical Data: No additional findings.  Objective: Vital Signs: There were no vitals taken for this visit.  Physical Exam:   Constitutional: Patient appears well-developed HEENT:  Head: Normocephalic Eyes:EOM are normal Neck: Normal range of motion Cardiovascular: Normal rate Pulmonary/chest: Effort normal Neurologic: Patient is alert Skin: Skin is warm Psychiatric: Patient has normal mood and affect    Ortho Exam: Ortho exam demonstrates well-healed surgical incision anteriorly with no real warmth to the shoulder.  Deltoid is functional.  Arm has good passive range of motion.  Does have predictable weakness to subscap and infraspinatus testing.  Motor or sensory function to the  hand is intact.  Radial pulses intact.  Neck range of motion is pretty reasonable with no reproduction of symptoms with head motion.  Specialty Comments:  No specialty comments available.  Imaging: No results found.   PMFS History: Patient Active Problem List   Diagnosis Date Noted  . Hormone replacement therapy (HRT) 03/22/2014  . HYPERTENSION 12/07/2009  . Acute bronchitis 12/07/2009  . ALLERGIC RHINITIS 12/07/2009  . OSTEOARTHRITIS, MULTIPLE JOINTS 12/07/2009  . CAROTID BRUIT, LEFT 12/07/2009   Past Medical History:  Diagnosis Date  . ALLERGIC RHINITIS   . Asthma   . Bronchitis   . DDD (degenerative  disc disease), cervical   . Esophageal reflux   . Fibromyalgia   . Osteoarthritis   . Unspecified essential hypertension     Family History  Problem Relation Age of Onset  . Asthma Mother   . Pancreatic cancer Father   . Ovarian cancer Mother     Past Surgical History:  Procedure Laterality Date  . APPENDECTOMY    . BACK SURGERY  2013   lumb fusion  . COLONOSCOPY    . ERCP    . ORIF TOE FRACTURE  02/27/2012   Procedure: OPEN REDUCTION INTERNAL FIXATION (ORIF) METATARSAL (TOE) FRACTURE;  Surgeon: Colin Rhein, MD;  Location: [facility];  Service: Orthopedics;  Laterality: Right;  RIGHT OPEN REDUCTION INTERNAL FIXATION NON UNION 1ST METATARSAL NECK,FLEXOR HALLUCIS LONGUS TO PROXIMAL PHALANAX TENDON TRANSFER,  GASTROC SLIDE,  LOCAL BONE GRAFT   . SHOULDER ARTHROSCOPY  2011   rt  . TENDON TRANSFER  02/27/2012   Procedure: TENDON TRANSFER;  Surgeon: Colin Rhein, MD;  Location: [facility];  Service: Orthopedics;  Laterality: Right;  . TOTAL ABDOMINAL HYSTERECTOMY    . TOTAL ABDOMINAL HYSTERECTOMY W/ BILATERAL SALPINGOOPHORECTOMY    . TOTAL HIP ARTHROPLASTY  03,09   rt and lt  . TOTAL HIP REVISION     Social History   Occupational History  . Occupation: retired  Tobacco Use  . Smoking status: Never Smoker  . Smokeless tobacco: Never Used  Substance and Sexual Activity  . Alcohol use: No  . Drug use: No  . Sexual activity: Not on file

## 2018-03-12 ENCOUNTER — Ambulatory Visit (INDEPENDENT_AMBULATORY_CARE_PROVIDER_SITE_OTHER): Payer: Medicare Other | Admitting: Orthopedic Surgery

## 2018-03-17 ENCOUNTER — Ambulatory Visit (INDEPENDENT_AMBULATORY_CARE_PROVIDER_SITE_OTHER): Payer: Medicare Other | Admitting: Orthopedic Surgery

## 2018-03-19 ENCOUNTER — Encounter (HOSPITAL_COMMUNITY)
Admission: RE | Admit: 2018-03-19 | Discharge: 2018-03-19 | Disposition: A | Discharge status: Home / Self Care | Payer: Medicare Other | Source: Ambulatory Visit | Attending: Orthopedic Surgery | Admitting: Orthopedic Surgery

## 2018-03-19 DIAGNOSIS — Z96611 Presence of right artificial shoulder joint: Secondary | ICD-10-CM

## 2018-03-19 MED ORDER — TECHNETIUM TC 99M MEDRONATE IV KIT
20.0000 | PACK | Freq: Once | INTRAVENOUS | Status: AC | PRN
  Administered 2018-03-19: 20 via INTRAVENOUS

## 2018-03-26 ENCOUNTER — Ambulatory Visit (INDEPENDENT_AMBULATORY_CARE_PROVIDER_SITE_OTHER): Payer: Medicare Other | Admitting: Orthopedic Surgery

## 2018-03-26 ENCOUNTER — Encounter (INDEPENDENT_AMBULATORY_CARE_PROVIDER_SITE_OTHER): Payer: Self-pay | Admitting: Orthopedic Surgery

## 2018-03-26 DIAGNOSIS — G8929 Other chronic pain: Secondary | ICD-10-CM

## 2018-03-26 DIAGNOSIS — M25511 Pain in right shoulder: Secondary | ICD-10-CM | POA: Diagnosis not present

## 2018-03-27 ENCOUNTER — Encounter (INDEPENDENT_AMBULATORY_CARE_PROVIDER_SITE_OTHER): Payer: Self-pay | Admitting: Orthopedic Surgery

## 2018-03-27 NOTE — Progress Notes (Signed)
Office Visit Note   Patient: Penny Perez           Date of Birth: [birth year]           MRN: [id number] Visit Date: 03/26/2018 Requested by: Chesley Noon, MD [place], [place] [address] PCP: Chesley Noon, MD  Subjective: Chief Complaint  Patient presents with  . Follow-up    HPI: Penny Perez presents with right shoulder pain.  Since have seen her she is had a bone scan which is reviewed.  Bone scan suggest possible focal area of loosening.  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  She reports having some pain and bruising in the arm after using Roundup at the end of last year.  She reports continued pain in the posterior lateral aspect of that right shoulder.  The area of increased uptake on the bone scan was more distal and anterior.              ROS: All systems reviewed are negative as they relate to the chief complaint within the history of present illness.  Patient denies  fevers or chills.   Assessment & Plan: Visit Diagnoses:  1. Chronic right shoulder pain     Plan: Impression is right shoulder pain with well-functioning reverse shoulder replacement.  No evidence of loosening or infection.  Her white count was normal on lab draw.  There was not enough blood for sed rate C-reactive protein.  Nonetheless the bone scan is fairly underwhelming in terms of any actionable results.  I think she likely had some type of soft tissue injury from overuse when she was carrying the Roundup and using it.  This may be tear of any residual infraspinatus or possible teres minor that she had attached.  Even though she is having pain I cannot find any correctable source of that problem.  We potentially could repeat x-ray the humeral shaft in about 6 months just to make sure that that cement mantle is not visibly loose on plain radiographs.  Short of that I think this is something she is going to have to live with.  I will see her back as needed.  Follow-Up  Instructions: Return if symptoms worsen or fail to improve.   Orders:  No orders of the defined types were placed in this encounter.  No orders of the defined types were placed in this encounter.     Procedures: No procedures performed   Clinical Data: No additional findings.  Objective: Vital Signs: There were no vitals taken for this visit.  Physical Exam:   Constitutional: Patient appears well-developed HEENT:  Head: Normocephalic Eyes:EOM are normal Neck: Normal range of motion Cardiovascular: Normal rate Pulmonary/chest: Effort normal Neurologic: Patient is alert Skin: Skin is warm Psychiatric: Patient has normal mood and affect    Ortho Exam: Ortho exam demonstrates not much pain with passive range of motion of that right shoulder.  Incision is intact.  Shoulder is not warm.  Deltoid is functional.  Does have predictably less external rotation strength on the right compared to the left.  Motor or sensory function to the hand is intact.  No lymphadenopathy in that right shoulder region.  No tenderness to palpation along the humeral shaft.  Specialty Comments:  No specialty comments available.  Imaging: No results found.   PMFS History: Patient Active Problem List   Diagnosis Date Noted  . Hormone replacement therapy (HRT) 03/22/2014  . HYPERTENSION 12/07/2009  . Acute bronchitis 12/07/2009  .  ALLERGIC RHINITIS 12/07/2009  . OSTEOARTHRITIS, MULTIPLE JOINTS 12/07/2009  . CAROTID BRUIT, LEFT 12/07/2009   Past Medical History:  Diagnosis Date  . ALLERGIC RHINITIS   . Asthma   . Bronchitis   . DDD (degenerative disc disease), cervical   . Esophageal reflux   . Fibromyalgia   . Osteoarthritis   . Unspecified essential hypertension     Family History  Problem Relation Age of Onset  . Asthma Mother   . Pancreatic cancer Father   . Ovarian cancer Mother     Past Surgical History:  Procedure Laterality Date  . APPENDECTOMY    . BACK SURGERY  2013    lumb fusion  . COLONOSCOPY    . ERCP    . ORIF TOE FRACTURE  02/27/2012   Procedure: OPEN REDUCTION INTERNAL FIXATION (ORIF) METATARSAL (TOE) FRACTURE;  Surgeon: Colin Rhein, MD;  Location: [facility];  Service: Orthopedics;  Laterality: Right;  RIGHT OPEN REDUCTION INTERNAL FIXATION NON UNION 1ST METATARSAL NECK,FLEXOR HALLUCIS LONGUS TO PROXIMAL PHALANAX TENDON TRANSFER,  GASTROC SLIDE,  LOCAL BONE GRAFT   . SHOULDER ARTHROSCOPY  2011   rt  . TENDON TRANSFER  02/27/2012   Procedure: TENDON TRANSFER;  Surgeon: Colin Rhein, MD;  Location: [facility];  Service: Orthopedics;  Laterality: Right;  . TOTAL ABDOMINAL HYSTERECTOMY    . TOTAL ABDOMINAL HYSTERECTOMY W/ BILATERAL SALPINGOOPHORECTOMY    . TOTAL HIP ARTHROPLASTY  03,09   rt and lt  . TOTAL HIP REVISION     Social History   Occupational History  . Occupation: retired  Tobacco Use  . Smoking status: Never Smoker  . Smokeless tobacco: Never Used  Substance and Sexual Activity  . Alcohol use: No  . Drug use: No  . Sexual activity: Not on file

## 2018-07-15 ENCOUNTER — Other Ambulatory Visit: Payer: Self-pay | Admitting: Internal Medicine

## 2018-07-15 DIAGNOSIS — R634 Abnormal weight loss: Secondary | ICD-10-CM

## 2018-08-07 ENCOUNTER — Other Ambulatory Visit: Payer: Self-pay | Admitting: Internal Medicine

## 2018-08-08 ENCOUNTER — Other Ambulatory Visit: Payer: Self-pay | Admitting: Gastroenterology

## 2018-08-08 DIAGNOSIS — R634 Abnormal weight loss: Secondary | ICD-10-CM

## 2018-08-09 ENCOUNTER — Other Ambulatory Visit: Payer: Self-pay | Admitting: Gastroenterology

## 2018-08-09 ENCOUNTER — Ambulatory Visit
Admission: RE | Admit: 2018-08-09 | Discharge: 2018-08-09 | Disposition: A | Discharge status: Home / Self Care | Payer: Medicare Other | Source: Ambulatory Visit | Attending: Internal Medicine | Admitting: Internal Medicine

## 2018-08-09 ENCOUNTER — Other Ambulatory Visit: Payer: Self-pay

## 2018-08-09 DIAGNOSIS — R634 Abnormal weight loss: Secondary | ICD-10-CM

## 2018-12-03 ENCOUNTER — Other Ambulatory Visit: Payer: Self-pay

## 2018-12-03 ENCOUNTER — Encounter (HOSPITAL_BASED_OUTPATIENT_CLINIC_OR_DEPARTMENT_OTHER): Payer: Medicare Other | Attending: Physician Assistant | Admitting: Physician Assistant

## 2018-12-03 DIAGNOSIS — Z881 Allergy status to other antibiotic agents status: Secondary | ICD-10-CM | POA: Insufficient documentation

## 2018-12-03 DIAGNOSIS — L98418 Non-pressure chronic ulcer of buttock with other specified severity: Secondary | ICD-10-CM | POA: Insufficient documentation

## 2018-12-03 DIAGNOSIS — M797 Fibromyalgia: Secondary | ICD-10-CM | POA: Diagnosis not present

## 2018-12-03 DIAGNOSIS — Z96643 Presence of artificial hip joint, bilateral: Secondary | ICD-10-CM | POA: Diagnosis not present

## 2018-12-03 DIAGNOSIS — X58XXXA Exposure to other specified factors, initial encounter: Secondary | ICD-10-CM | POA: Diagnosis not present

## 2018-12-03 DIAGNOSIS — I10 Essential (primary) hypertension: Secondary | ICD-10-CM | POA: Diagnosis not present

## 2018-12-03 DIAGNOSIS — E039 Hypothyroidism, unspecified: Secondary | ICD-10-CM | POA: Diagnosis not present

## 2018-12-03 DIAGNOSIS — M1990 Unspecified osteoarthritis, unspecified site: Secondary | ICD-10-CM | POA: Diagnosis not present

## 2018-12-03 DIAGNOSIS — J45909 Unspecified asthma, uncomplicated: Secondary | ICD-10-CM | POA: Insufficient documentation

## 2018-12-03 DIAGNOSIS — Z6822 Body mass index (BMI) 22.0-22.9, adult: Secondary | ICD-10-CM | POA: Insufficient documentation

## 2018-12-03 DIAGNOSIS — S31819A Unspecified open wound of right buttock, initial encounter: Secondary | ICD-10-CM | POA: Diagnosis not present

## 2018-12-03 DIAGNOSIS — Z885 Allergy status to narcotic agent status: Secondary | ICD-10-CM | POA: Insufficient documentation

## 2018-12-03 DIAGNOSIS — Z888 Allergy status to other drugs, medicaments and biological substances status: Secondary | ICD-10-CM | POA: Insufficient documentation

## 2018-12-03 DIAGNOSIS — Z880 Allergy status to penicillin: Secondary | ICD-10-CM | POA: Diagnosis not present

## 2018-12-03 DIAGNOSIS — Z882 Allergy status to sulfonamides status: Secondary | ICD-10-CM | POA: Insufficient documentation

## 2018-12-08 NOTE — Progress Notes (Signed)
MAYERLY, NUNAMAKER ([id number]) Visit Report for 12/03/2018 Abuse/Suicide Risk Screen Details Patient Name: Date of Service: Penny Perez, Penny Perez 12/03/2018 1:15 PM Medical Record [id number] Patient Account Number: [id number] Date of Birth/Sex: Treating RN: 1932-07-08 (85 y.o. Female) Levan Hurst Primary Care Nabil Bubolz: [name] Other Clinician: Referring Taeler Winning: Treating Ijanae Macapagal/Extender:Stone III, Lemar Livings, Esperanza Richters in Treatment: 0 Abuse/Suicide Risk Screen Items Answer ABUSE RISK SCREEN: Has anyone close to you tried to hurt or harm you recentlyo No Do you feel uncomfortable with anyone in your familyo No Has anyone forced you do things that you didnt want to doo No Electronic Signature(s) Signed: 12/08/2018 5:46:53 PM By: Levan Hurst RN, BSN Entered By: Levan Hurst on 12/03/2018 13:57:48 -------------------------------------------------------------------------------- Activities of Daily Living Details Patient Name: Date of Service: Penny Perez, Penny Perez 12/03/2018 1:15 PM Medical Record [id number] Patient Account Number: [id number] Date of Birth/Sex: Treating RN: 1932-05-02 (85 y.o. Female) Levan Hurst Primary Care Patric Vanpelt: [name] Other Clinician: Referring Dericka Ostenson: Treating Akeisha Lagerquist/Extender:Stone III, Lemar Livings, Esperanza Richters in Treatment: 0 Activities of Daily Living Items Answer Activities of Daily Living (Please select one for each item) Drive Automobile Completely Able Take Medications Completely Able Use Telephone Completely Able Care for Appearance Completely Able Use Toilet Completely Able Bath / Shower Completely Able Dress Self Completely Able Feed Self Completely Able Walk Completely Able Get In / Out Bed Completely Able Housework Completely Able Prepare Meals Completely Able Handle Money Completely Able Shop for Self Completely Able Electronic Signature(s) Signed: 12/08/2018 5:46:53 PM By: Levan Hurst  RN, BSN Entered By: Levan Hurst on 12/03/2018 13:58:08 -------------------------------------------------------------------------------- Education Screening Details Patient Name: Date of Service: Penny Perez, Penny Perez 12/03/2018 1:15 PM Medical Record [id number] Patient Account Number: [id number] Date of Birth/Sex: Treating RN: 05-Apr-1932 (85 y.o. Female) Levan Hurst Primary Care Bram Hottel: [name] Other Clinician: Referring Thoms Barthelemy: Treating Kevion Fatheree/Extender:Stone III, Lemar Livings, Esperanza Richters in Treatment: 0 Primary Learner Assessed: Patient Learning Preferences/Education Level/Primary Language Learning Preference: Explanation, Demonstration, Printed Material Highest Education Level: High School Preferred Language: English Cognitive Barrier Language Barrier: No Translator Needed: No Memory Deficit: No Emotional Barrier: No Cultural/Religious Beliefs Affecting Medical Care: No Physical Barrier Impaired Vision: No Impaired Hearing: No Decreased Hand dexterity: No Knowledge/Comprehension Knowledge Level: High Comprehension Level: High Ability to understand written High instructions: Ability to understand verbal High instructions: Motivation Anxiety Level: Calm Cooperation: Cooperative Education Importance: Acknowledges Need Interest in Health Problems: Asks Questions Perception: Coherent Willingness to Engage in Self- High Management Activities: Readiness to Engage in Self- High Management Activities: Electronic Signature(s) Signed: 12/08/2018 5:46:53 PM By: Levan Hurst RN, BSN Entered By: Levan Hurst on 12/03/2018 13:58:30 -------------------------------------------------------------------------------- Fall Risk Assessment Details Patient Name: Date of Service: Penny Perez, Penny Perez 12/03/2018 1:15 PM Medical Record [id number] Patient Account Number: [id number] Date of Birth/Sex: Treating RN: Oct 01, 1932 (85 y.o. Female) Levan Hurst Primary Care Terrill Wauters: [name] Other Clinician: Referring Kaijah Abts: Treating Leyan Branden/Extender:Stone III, Lemar Livings, Esperanza Richters in Treatment: 0 Fall Risk Assessment Items Have you had 2 or more falls in the last 12 monthso 0 No Have you had any fall that resulted in injury in the last 12 monthso 0 No FALLS RISK SCREEN History of falling - immediate or within 3 months 0 No Secondary diagnosis (Do you have 2 or more medical diagnoseso) 15 Yes Ambulatory aid None/bed rest/wheelchair/nurse 0 Yes Crutches/cane/walker 0 No Furniture 0 No Intravenous therapy Access/Saline/Heparin Lock 0 No Weak (short steps with or without shuffle, stooped but able to lift head 0 No while walking, may seek support from furniture) Impaired (short steps  with shuffle, may have difficulty arising from chair, 0 No head down, impaired balance) Mental Status Oriented to own ability 0 Yes Overestimates or forgets limitations 0 No Risk Level: Low Risk Score: 15 Electronic Signature(s) Signed: 12/08/2018 5:46:53 PM By: Levan Hurst RN, BSN Entered By: Levan Hurst on 12/03/2018 13:59:00 -------------------------------------------------------------------------------- Nutrition Risk Screening Details Patient Name: Date of Service: Penny Perez, Penny Perez 12/03/2018 1:15 PM Medical Record [id number] Patient Account Number: [id number] Date of Birth/Sex: Treating RN: 1932-12-23 (85 y.o. Female) Levan Hurst Primary Care Lowell Mcgurk: [name] Other Clinician: Referring Catelin Manthe: Treating Adolph Clutter/Extender:Stone III, Lemar Livings, Esperanza Richters in Treatment: 0 Height (in): 60 Weight (lbs): 115 Body Mass Index (BMI): 22.5 Nutrition Risk Screening Items Score Screening NUTRITION RISK SCREEN: I have an illness or condition that made me change the kind and/or 0 No amount of food I eat I eat fewer than two meals per day 0 No I eat few fruits and vegetables, or milk products 0 No I  have three or more drinks of beer, liquor or wine almost every day 0 No I have tooth or mouth problems that make it hard for me to eat 0 No I don't always have enough money to buy the food I need 0 No I eat alone most of the time 0 No I take three or more different prescribed or over-the-counter drugs a day 1 Yes 2 Yes Without wanting to, I have lost or gained 10 pounds in the last six months I am not always physically able to shop, cook and/or feed myself 0 No Nutrition Protocols Good Risk Protocol Provide education on Moderate Risk Protocol 0 nutrition High Risk Proctocol Risk Level: Moderate Risk Score: 3 Electronic Signature(s) Signed: 12/08/2018 5:46:53 PM By: Levan Hurst RN, BSN Entered By: Levan Hurst on 12/03/2018 13:59:27

## 2018-12-08 NOTE — Progress Notes (Signed)
Penny Perez, Penny Perez ([id number]) Visit Report for 12/03/2018 Allergy List Details Patient Name: Date of Service: Penny Perez, Penny Perez 12/03/2018 1:15 PM Medical Record [id number] Patient Account Number: [id number] Date of Birth/Sex: Treating RN: 11-15-1932 (85 y.o. Female) Levan Hurst Primary Care Tiffiney Sparrow: [name] Other Clinician: Referring Welma Mccombs: Treating Virginie Josten/Extender:Stone III, Lemar Livings, Esperanza Richters in Treatment: 0 Allergies Active Allergies ampicillin Reaction: fever Severity: Moderate cephalexin Reaction: unknown Severity: Mild clarithromycin Reaction: unknown codeine Reaction: unknown Cipro Reaction: blisters in mouth Severity: Moderate doxycycline Reaction: unknown erythromycin base Reaction: unknown gatifloxacin Reaction: blisters in mouth hydrocodone Reaction: unknown penicillin Reaction: blisters in mouth Sulfa (Sulfonamide Antibiotics) Reaction: abdominal pain and bloating Allergy Notes Electronic Signature(s) Signed: 12/08/2018 5:46:53 PM By: Levan Hurst RN, BSN Entered By: Levan Hurst on 12/03/2018 14:36:34 -------------------------------------------------------------------------------- Arrival Information Details Patient Name: Date of Service: Penny Perez, Penny Perez 12/03/2018 1:15 PM Medical Record [id number] Patient Account Number: [id number] Date of Birth/Sex: Treating RN: September 11, 1932 (85 y.o. Female) Levan Hurst Primary Care Stony Stegmann: [name] Other Clinician: Referring Jahsiah Carpenter: Treating Moneka Mcquinn/Extender:Stone III, Lemar Livings, Esperanza Richters in Treatment: 0 Visit Information Patient Arrived: Ambulatory Arrival Time: 13:30 Accompanied By: alone Transfer Assistance: None Patient Identification Verified: Yes Secondary Verification Process Completed: Yes Patient Requires Transmission-Based No Precautions: Patient Has Alerts: No Electronic Signature(s) Signed: 12/08/2018 5:46:53 PM By: Levan Hurst RN, BSN Entered By: Levan Hurst on 12/03/2018 13:32:33 -------------------------------------------------------------------------------- Clinic Level of Care Assessment Details Patient Name: Date of Service: Penny Perez, Penny Perez 12/03/2018 1:15 PM Medical Record [id number] Patient Account Number: [id number] Date of Birth/Sex: Treating RN: 1932/04/26 (85 y.o. Female) Baruch Gouty Primary Care Nasha Diss: [name] Other Clinician: Referring Britiany Silbernagel: Treating Ronald Londo/Extender:Stone III, Lemar Livings, Esperanza Richters in Treatment: 0 Clinic Level of Care Assessment Items TOOL 1 Quantity Score []  - Use when EandM and Procedure is performed on INITIAL visit 0 ASSESSMENTS - Nursing Assessment / Reassessment X - General Physical Exam (combine w/ comprehensive assessment (listed just below) 1 20 when performed on new pt. evals) X - Comprehensive Assessment (HX, ROS, Risk Assessments, Wounds Hx, etc.) 1 25 ASSESSMENTS - Wound and Skin Assessment / Reassessment []  - Dermatologic / Skin Assessment (not related to wound area) 0 ASSESSMENTS - Ostomy and/or Continence Assessment and Care []  - Incontinence Assessment and Management 0 []  - Ostomy Care Assessment and Management (repouching, etc.) 0 PROCESS - Coordination of Care X - Simple Patient / Family Education for ongoing care 1 15 []  - Complex (extensive) Patient / Family Education for ongoing care 0 X - Staff obtains Programmer, systems, Records, Test Results / Process Orders 1 10 []  - Staff telephones HHA, Nursing Homes / Clarify orders / etc 0 []  - Routine Transfer to another Facility (non-emergent condition) 0 []  - Routine Hospital Admission (non-emergent condition) 0 X - New Admissions / Biomedical engineer / Ordering NPWT, Apligraf, etc. 1 15 []  - Emergency Hospital Admission (emergent condition) 0 PROCESS - Special Needs []  - Pediatric / Minor Patient Management 0 []  - Isolation Patient Management 0 []  - Hearing /  Language / Visual special needs 0 []  - Assessment of Community assistance (transportation, D/C planning, etc.) 0 []  - Additional assistance / Altered mentation 0 []  - Support Surface(s) Assessment (bed, cushion, seat, etc.) 0 INTERVENTIONS - Miscellaneous []  - External ear exam 0 []  - Patient Transfer (multiple staff / Civil Service fast streamer / Similar devices) 0 []  - Simple Staple / Suture removal (25 or less) 0 []  - Complex Staple / Suture removal (26 or more) 0 []  - Hypo/Hyperglycemic Management (do not check if billed  separately) 0 []  - Ankle / Brachial Index (ABI) - do not check if billed separately 0 Has the patient been seen at the hospital within the last three years: Yes Total Score: 85 Level Of Care: New/Established - Level 3 Electronic Signature(s) Signed: 12/03/2018 6:42:58 PM By: Baruch Gouty RN, BSN Entered By: Baruch Gouty on 12/03/2018 14:46:48 -------------------------------------------------------------------------------- Multi-Disciplinary Care Plan Details Patient Name: Date of Service: Penny Perez, Penny Perez 12/03/2018 1:15 PM Medical Record [id number] Patient Account Number: [id number] Date of Birth/Sex: Treating RN: 1932-06-15 (85 y.o. Female) Baruch Gouty Primary Care Ciela Mahajan: [name] Other Clinician: Referring Franci Oshana: Treating Rosealie Reach/Extender:Stone III, Lemar Livings, Esperanza Richters in Treatment: 0 Active Inactive Malignancy/Atypical Etiology Nursing Diagnoses: Knowledge deficit related to disease process and management of atypical ulcer etiology Goals: Patient/caregiver will verbalize understanding of disease process and disease management of atypical ulcer etiology Date Initiated: 12/03/2018 Target Resolution Date: 12/31/2018 Goal Status: Active Interventions: Assess patient and family medical history for signs and symptoms of malignancy/atypical etiology upon admission Provide education on atypical ulcer etiologies Notes: Wound/Skin  Impairment Nursing Diagnoses: Impaired tissue integrity Knowledge deficit related to ulceration/compromised skin integrity Goals: Patient/caregiver will verbalize understanding of skin care regimen Date Initiated: 12/03/2018 Target Resolution Date: 12/31/2018 Goal Status: Active Ulcer/skin breakdown will have a volume reduction of 30% by week 4 Date Initiated: 12/03/2018 Target Resolution Date: 12/31/2018 Goal Status: Active Interventions: Assess patient/caregiver ability to obtain necessary supplies Assess patient/caregiver ability to perform ulcer/skin care regimen upon admission and as needed Assess ulceration(s) every visit Treatment Activities: Skin care regimen initiated : 12/03/2018 Topical wound management initiated : 12/03/2018 Notes: Electronic Signature(s) Signed: 12/03/2018 6:42:58 PM By: Baruch Gouty RN, BSN Entered By: Baruch Gouty on 12/03/2018 14:45:03 -------------------------------------------------------------------------------- Pain Assessment Details Patient Name: Date of Service: Penny Perez, Penny Perez 12/03/2018 1:15 PM Medical Record [id number] Patient Account Number: [id number] Date of Birth/Sex: Treating RN: 04/19/1932 (85 y.o. Female) Levan Hurst Primary Care Laterria Lasota: [name] Other Clinician: Referring Anntoinette Haefele: Treating Gaile Allmon/Extender:Stone III, Lemar Livings, Esperanza Richters in Treatment: 0 Active Problems Location of Pain Severity and Description of Pain Patient Has Paino Yes Site Locations Pain Location: Pain in Ulcers With Dressing Change: Yes Duration of the Pain. Constant / Intermittento Intermittent Rate the pain. Current Pain Level: 2 Worst Pain Level: 8 Least Pain Level: 0 Character of Pain Describe the Pain: Throbbing Pain Management and Medication Current Pain Management: Medication: Yes Cold Application: No Rest: No Massage: No Activity: No T.E.N.S.: No Heat Application: No Leg drop or elevation: No Is  the Current Pain Management Adequate: Adequate How does your wound impact your activities of daily livingo Sleep: No Bathing: No Appetite: No Relationship With Others: No Bladder Continence: No Emotions: No Bowel Continence: No Work: No Toileting: No Drive: No Dressing: No Hobbies: No Electronic Signature(s) Signed: 12/08/2018 5:46:53 PM By: Levan Hurst RN, BSN Entered By: Levan Hurst on 12/03/2018 14:04:02 -------------------------------------------------------------------------------- Patient/Caregiver Education Details Patient Name: Penny Perez 11/4/2020andnbsp1:15 Date of Service: PM Medical Record [id number] Number: Patient Account Number: [id number] Treating RN: 09/06/1932 (85 y.o. Baruch Gouty Date of Birth/Gender: Female) Other Clinician: Primary Care Treating Melford Aase [place] Physician: Physician/Extender: Referring Physician: Lorelle Gibbs in Treatment: 0 Education Assessment Education Provided To: Patient Education Topics Provided Malignant/Atypical Wounds: Methods: Explain/Verbal Responses: Reinforcements needed, State content correctly Welcome To [facility] [place]: Handouts: Welcome To [facility] [place] Methods: Explain/Verbal Responses: Reinforcements needed, State content correctly Wound/Skin Impairment: Handouts: Caring for Your Ulcer, Skin Care Do's and Dont's Methods: Explain/Verbal, Printed Responses: Reinforcements needed, State  content correctly Electronic Signature(s) Signed: 12/03/2018 6:42:58 PM By: Baruch Gouty RN, BSN Entered By: Baruch Gouty on 12/03/2018 14:46:11 -------------------------------------------------------------------------------- Wound Assessment Details Patient Name: Date of Service: Penny Perez, Penny Perez 12/03/2018 1:15 PM Medical Record [id number] Patient Account Number: [id number] Date of Birth/Sex: Treating RN: 10-Apr-1932 (85 y.o. Female) Baruch Gouty Primary  Care Quatavious Rossa: [name] Other Clinician: Referring Reniya Mcclees: Treating Londyn Wotton/Extender:Stone III, Lemar Livings, Esperanza Richters in Treatment: 0 Wound Status Wound Number: 1 Primary Atypical Etiology: Wound Location: Right Gluteus Wound Open Wounding Event: Gradually Appeared Status: Date Acquired: 07/30/2018 Comorbid Cataracts, Asthma, Hypertension, Weeks Of Treatment: 0 History: Osteoarthritis Clustered Wound: No Photos Wound Measurements Length: (cm) 0.8 % Reduct Width: (cm) 1.7 % Reduct Depth: (cm) 0.8 Epitheli Area: (cm) 1.068 Tunneli Volume: (cm) 0.855 Undermi Wound Description Full Thickness Without Exposed Support Foul Od Classification: Structures Slough/ Wound Flat and Intact Margin: Exudate Medium Amount: Exudate Serosanguineous Type: Exudate red, brown Color: Wound Bed Granulation Amount: None Present (0%) Necrotic Amount: Large (67-100%) Fascia E Necrotic Quality: Adherent Slough Fat Laye Tendon E Muscle E Joint Ex Bone Exp Electronic Signature(s) Signed: 12/04/2018 6:27:24 PM By: Baruch Gouty RN, BSN Previous Signature: 12/04/2018 4:13:56 PM Version By: Sanda Klein Previous Signature: 12/03/2018 6:42:58 PM Version By: Alla German Entered By: Baruch Gouty on 11/05/ or After Cleansing: No Fibrino Yes Exposed Structure xposed: No r (Subcutaneous Tissue) Exposed: Yes xposed: No xposed: No posed: No osed: No ck EMT/HBOT nda RN, BSN 2020 16:39:44 ion in Area: 0% ion in Volume: 0% alization: None ng: No ning: No -------------------------------------------------------------------------------- Vitals Details Patient Name: Date of Service: Penny Perez, Penny Perez 12/03/2018 1:15 PM Medical Record [id number] Patient Account Number: [id number] Date of Birth/Sex: Treating RN: [birth year] (85 y.o. Female) Levan Hurst Primary Care Kristyl Athens: [name] Other Clinician: Referring Kayton Ripp: Treating Lew Prout/Extender:Stone III,  Lemar Livings, Esperanza Richters in Treatment: 0 Vital Signs Time Taken: 13:34 Temperature (F): 98.8 Height (in): 60 Pulse (bpm): 69 Source: Stated Respiratory Rate (breaths/min): 16 Weight (lbs): 115 Blood Pressure (mmHg): 160/74 Source: Stated Reference Range: 80 - 120 mg / dl Body Mass Index (BMI): 22.5 Electronic Signature(s) Signed: 12/08/2018 5:46:53 PM By: Levan Hurst RN, BSN Entered By: Levan Hurst on 12/03/2018 13:39:43

## 2018-12-08 NOTE — Progress Notes (Signed)
NORMANI, BORNTREGER ([id number]) Visit Report for 12/03/2018 Chief Complaint Document Details Patient Name: Date of Service: Penny Perez, Penny Perez 12/03/2018 1:15 PM Medical Record [id number] Patient Account Number: [id number] Date of Birth/Sex: Treating RN: August 18, 1932 (85 y.o. Female) Baruch Gouty Primary Care Provider: [name] Other Clinician: Referring Provider: Treating Provider/Extender:Stone III, Lemar Livings, Esperanza Richters in Treatment: 0 Information Obtained from: Patient Chief Complaint Right buttock ulcer Electronic Signature(s) Signed: 12/03/2018 6:48:11 PM By: Worthy Keeler PA-C Entered By: Worthy Keeler on 12/03/2018 14:41:37 -------------------------------------------------------------------------------- Debridement Details Patient Name: Date of Service: Penny Perez, Penny Perez 12/03/2018 1:15 PM Medical Record [id number] Patient Account Number: [id number] Date of Birth/Sex: 05/18/1932 (85 y.o. Female) Treating RN: Baruch Gouty Primary Care Provider: [name] Other Clinician: Referring Provider: Treating Provider/Extender:Stone III, Lemar Livings, Esperanza Richters in Treatment: 0 Debridement Performed for Wound #1 Right Gluteus Assessment: Performed By: Physician Worthy Keeler, PA Debridement Type: Debridement Level of Consciousness (Pre- Awake and Alert procedure): Pre-procedure Verification/Time Out Taken: Yes - 14:50 Start Time: 14:55 Pain Control: Lidocaine 5% topical ointment Total Area Debrided (L x W): 0.8 (cm) x 1.7 (cm) = 1.36 (cm) Tissue and other material Viable, Non-Viable, Slough, Subcutaneous, Slough debrided: Level: Skin/Subcutaneous Tissue Debridement Description: Excisional Instrument: Curette Bleeding: Minimum Hemostasis Achieved: Pressure End Time: 14:58 Procedural Pain: 5 Post Procedural Pain: 2 Response to Treatment: Procedure was tolerated well Level of Consciousness Awake and Alert (Post-procedure): Post  Debridement Measurements of Total Wound Length: (cm) 0.8 Width: (cm) 1.7 Depth: (cm) 0.8 Volume: (cm) 0.855 Character of Wound/Ulcer Post Requires Further Debridement Debridement: Post Procedure Diagnosis Same as Pre-procedure Electronic Signature(s) Signed: 12/03/2018 6:42:58 PM By: Baruch Gouty RN, BSN Signed: 12/03/2018 6:48:11 PM By: Worthy Keeler PA-C Entered By: Baruch Gouty on 12/03/2018 14:58:10 -------------------------------------------------------------------------------- HPI Details Patient Name: Date of Service: Penny Perez, Penny Perez 12/03/2018 1:15 PM Medical Record [id number] Patient Account Number: [id number] Date of Birth/Sex: Treating RN: 1932/04/23 (85 y.o. Female) Baruch Gouty Primary Care Provider: [name] Other Clinician: Referring Provider: Treating Provider/Extender:Stone III, Lemar Livings, Esperanza Richters in Treatment: 0 History of Present Illness HPI Description: 12/03/2018 on evaluation today patient appears for initial inspection here in our clinic concerning an issue that she has been having a light having in the right gluteal region with an ulcer which initially she describes as having been a knot which when she went to see the dermatologist was biopsied and this was on 08/26/2018. Subsequently that revealed what appeared to be an inflamed ulcer with granulation tissue. Fortunately there was no signs of active infection based on what they saw on pathology. The patient states that since the biopsy she has been having a lot of pain. Fortunately there is no signs of active infection at this time no fever chills noted. She does have a history of hypertension but really no major medical problems significant otherwise although she does have a lot of allergies including allergies to [facility] as well as antibiotics. In fact she has some ulcers in her mouth secondary to having been prescribed an antibiotic for the current ulcer. She states she  has been having trouble sleeping due to the pain in the ulcer area. Electronic Signature(s) Signed: 12/03/2018 6:48:11 PM By: Worthy Keeler PA-C Entered By: Worthy Keeler on 12/03/2018 15:06:13 -------------------------------------------------------------------------------- Physical Exam Details Patient Name: Date of Service: Penny Perez, Penny Perez 12/03/2018 1:15 PM Medical Record [id number] Patient Account Number: [id number] Date of Birth/Sex: Treating RN: Aug 22, 1932 (85 y.o. Female) Baruch Gouty Primary Care Provider: [name] Other Clinician: Referring Provider: Treating Provider/Extender:Stone III,  Lemar Livings, Michael Weeks in Treatment: 0 Constitutional patient is hypertensive.. pulse regular and within target range for patient.Marland Kitchen respirations regular, non-labored and within target range for patient.Marland Kitchen temperature within target range for patient.. Well-nourished and well-hydrated in no acute distress. Eyes conjunctiva clear no eyelid edema noted. pupils equal round and reactive to light and accommodation. Ears, Nose, Mouth, and Throat no gross abnormality of ear auricles or external auditory canals. normal hearing noted during conversation. mucus membranes moist. Respiratory normal breathing without difficulty. clear to auscultation bilaterally. Cardiovascular regular rate and rhythm with normal S1, S2. no clubbing, cyanosis, significant edema, <3 sec cap refill. Gastrointestinal (GI) soft, non-tender, non-distended, +BS. no ventral hernia noted. Musculoskeletal stooped posture. Patient does have what appears to be osteo as well as rheumatoid arthritis and deformities associated as such.. Psychiatric this patient is able to make decisions and demonstrates good insight into disease process. Alert and Oriented x 3. pleasant and cooperative. Notes Upon inspection today patient's wound bed actually showed slough covering the pretty much entire surface of  the wound upon inspection today. Fortunately there is no signs of active infection but again she did have a fairly thick layer of slough and necrotic tissue that is going to have to be to some degree debrided away although she is very concerned about the pain associated with this. For that reason I explained to the patient that I do believe we can debride away a good portion of the necrotic tissue without causing her any significant pain and hopefully she should be able to tolerate Santyl which will help to some of the necrotic tissue going forward. Electronic Signature(s) Signed: 12/03/2018 6:48:11 PM By: Worthy Keeler PA-C Entered By: Worthy Keeler on 12/03/2018 15:07:24 -------------------------------------------------------------------------------- Physician Orders Details Patient Name: Date of Service: Penny Perez, Penny Perez 12/03/2018 1:15 PM Medical Record [id number] Patient Account Number: [id number] Date of Birth/Sex: Treating RN: 10-Jun-1932 (85 y.o. 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 Dressing Change Frequency Wound #1 Right Gluteus Change dressing every day. Wound Cleansing Wound #1 Right Gluteus May shower and wash wound with soap and water. Primary Wound Dressing Wound #1 Right Gluteus Santyl Ointment Secondary Dressing Wound #1 Right Gluteus Foam Border - or large bandaid Patient Medications Allergies: ampicillin, Cipro, cephalexin, clarithromycin, codeine, doxycycline, erythromycin base,  gatifloxacin, hydrocodone, penicillin, Sulfa (Sulfonamide Antibiotics) Notifications Medication Indication Start End Santyl 12/03/2018 DOSE topical 250 unit/gram ointment - ointment topical Apply nickel thick to the wound bed and then cover with a dressing as directed in clinic. Electronic Signature(s) Signed: 12/03/2018 6:39:14 PM By: Worthy Keeler PA-C Entered By: Worthy Keeler on 12/03/2018 18:39:14 -------------------------------------------------------------------------------- Problem List Details Patient Name: Date of Service: Penny Perez, Penny Perez 12/03/2018 1:15 PM Medical Record [id number] Patient Account Number: [id number] Date of Birth/Sex: Treating RN: 1932-06-10 (85 y.o. 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 Female) Baruch Gouty Primary Care Provider: [name] Other Clinician: Referring Provider: Treating Provider/Extender:Stone III, Lemar Livings, Esperanza Richters in Treatment: 0 Subjective Chief Complaint Information obtained from  Patient Right buttock ulcer History of Present Illness (HPI) 12/03/2018 on evaluation today patient appears for initial inspection here in our clinic concerning an issue that she has been having a light having in the right gluteal region with an ulcer which initially she describes as having been a knot which when she went to see the dermatologist was biopsied and this was on 08/26/2018. Subsequently that revealed what appeared to be an inflamed ulcer with granulation tissue. Fortunately there was no signs of active infection based on what they saw on pathology. The patient states that since the biopsy she has been having a lot of pain. Fortunately there is no signs of active infection at this time no fever chills noted. She does have a history of hypertension but really no major medical problems significant otherwise although she does have a lot of allergies including allergies to [facility] as well as antibiotics. In fact she has some ulcers in her mouth secondary to having been prescribed an antibiotic for the current ulcer. She states she has been having trouble sleeping due to the pain in the ulcer area. Patient History Information obtained from Patient. Allergies ampicillin (Severity: Moderate, Reaction: fever), cephalexin (Severity: Mild, Reaction: unknown), clarithromycin (Reaction: unknown), codeine (Reaction: unknown), Cipro (Severity: Moderate, Reaction: blisters in mouth), doxycycline (Reaction: unknown), erythromycin base (Reaction: unknown), gatifloxacin (Reaction: blisters in mouth), hydrocodone (Reaction: unknown), penicillin (Reaction: blisters in mouth), Sulfa (Sulfonamide Antibiotics) (Reaction: abdominal pain and bloating) Family History Cancer - Father,Mother, Lung Disease - Mother, No family history of Diabetes, Heart Disease, Hereditary Spherocytosis, Hypertension, Kidney Disease, Seizures, Stroke, Thyroid Problems, Tuberculosis. Social History Never smoker, Marital  Status - Divorced, Alcohol Use - Never, Drug Use - No History, Caffeine Use - Never. Medical History Eyes Patient has history of Cataracts Respiratory Patient has history of Asthma Cardiovascular Patient has history of Hypertension Endocrine Denies history of Type I Diabetes, Type II Diabetes Integumentary (Skin) Denies history of History of Burn Musculoskeletal Patient has history of Osteoarthritis Medical And Surgical History Notes Respiratory Allergic Rhinitis Cardiovascular Carotid Bruit Gastrointestinal GERD Endocrine Hypothyroidism Musculoskeletal Fibromyalgia, Degenerative Disc Disease, bilateral hip replacements Review of Systems (ROS) Constitutional Symptoms (General Health) Denies complaints or symptoms of Fatigue, Fever, Chills, Marked Weight Change. Ear/Nose/Mouth/Throat Denies complaints or symptoms of Chronic sinus problems or rhinitis. Gastrointestinal Denies complaints or symptoms of Frequent diarrhea, Nausea, Vomiting. Endocrine Denies complaints or symptoms of Heat/cold intolerance. Genitourinary Denies complaints or symptoms of Frequent urination. Integumentary (Skin) Complains or has symptoms of Wounds - wound on right gluteus. Neurologic Denies complaints or symptoms of Numbness/parasthesias. Psychiatric Denies complaints or symptoms of Claustrophobia, Suicidal. Objective Constitutional patient is hypertensive.. pulse regular and within target range for patient.Marland Kitchen respirations regular, non-labored and within target range for patient.Marland Kitchen temperature within target range for patient.. Well-nourished and well-hydrated in no acute distress. Vitals Time Taken: 1:34 PM, Height: 60 in, Source: Stated, Weight: 115 lbs, Source: Stated, BMI: 22.5, Temperature: 98.8 F, Pulse: 69 bpm, Respiratory Rate: 16 breaths/min, Blood Pressure: 160/74 mmHg. Eyes conjunctiva clear no eyelid edema noted.  pupils equal round and reactive to light and accommodation. Ears,  Nose, Mouth, and Throat no gross abnormality of ear auricles or external auditory canals. normal hearing noted during conversation. mucus membranes moist. Respiratory normal breathing without difficulty. clear to auscultation bilaterally. Cardiovascular regular rate and rhythm with normal S1, S2. no clubbing, cyanosis, significant edema, Gastrointestinal (GI) soft, non-tender, non-distended, +BS. no ventral hernia noted. Musculoskeletal stooped posture. Patient does have what appears to be osteo as well as rheumatoid arthritis and deformities associated as such.. Psychiatric this patient is able to make decisions and demonstrates good insight into disease process. Alert and Oriented x 3. pleasant and cooperative. General Notes: Upon inspection today patient's wound bed actually showed slough covering the pretty much entire surface of the wound upon inspection today. Fortunately there is no signs of active infection but again she did have a fairly thick layer of slough and necrotic tissue that is going to have to be to some degree debrided away although she is very concerned about the pain associated with this. For that reason I explained to the patient that I do believe we can debride away a good portion of the necrotic tissue without causing her any significant pain and hopefully she should be able to tolerate Santyl which will help to some of the necrotic tissue going forward. Integumentary (Hair, Skin) Wound #1 status is Open. Original cause of wound was Gradually Appeared. The wound is located on the Right Gluteus. The wound measures 0.8cm length x 1.7cm width x 0.8cm depth; 1.068cm^2 area and 0.855cm^3 volume. There is Fat Layer (Subcutaneous Tissue) Exposed exposed. There is no tunneling or undermining noted. There is a medium amount of serosanguineous drainage noted. The wound margin is flat and intact. There is no granulation within the wound bed. There is a large (67-100%) amount  of necrotic tissue within the wound bed including Adherent Slough. 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 There was a Excisional Skin/Subcutaneous Tissue Debridement with a total area of 1.36 sq cm performed by Worthy Keeler, PA. With the following instrument(s): Curette to remove Viable and Non-Viable tissue/material. Material removed includes Subcutaneous Tissue and Slough and after achieving pain control using Lidocaine 5% topical ointment. No specimens were taken. A time out was conducted at 14:50, prior to the start of the procedure. A Minimum amount of bleeding was controlled with Pressure. The procedure was tolerated well with a pain level of 5 throughout and a pain level of 2 following the procedure. Post Debridement Measurements: 0.8cm length x 1.7cm width x 0.8cm depth; 0.855cm^3 volume. Character of Wound/Ulcer Post Debridement requires further debridement. Post procedure Diagnosis Wound #1: Same as Pre-Procedure Plan Follow-up Appointments: Return Appointment in 1 week. Dressing Change Frequency: Wound #1 Right Gluteus: Change dressing every day. Wound Cleansing: Wound #1 Right Gluteus: May shower and wash wound with soap and water. Primary Wound Dressing: Wound #1 Right Gluteus: Santyl Ointment Secondary Dressing: Wound #1 Right Gluteus: Foam Border - or large bandaid The following medication(s) was prescribed: Santyl topical 250 unit/gram ointment ointment topical Apply nickel thick to the wound bed and then cover with a dressing as directed in clinic. starting 12/03/2018 1. My suggestion at this point is going to be that we go ahead and initiate treatment with Santyl after debridement today I do believe  the  Santyl will be able to get to some of the lower and deeper areas of this wound hopefully allowing this to heal more appropriately and quickly. 2. I am also going to suggest at this time that we go ahead and cover the area with a border foam dressing I am hopeful that the adhesive would not cause her any trouble irritation she seems to be very sensitive to a lot of adhesive's at this point. 3. I do recommend that she avoid significant pressure to the wound bed region. We will see patient back for reevaluation in 1 week here in the clinic. If anything worsens or changes patient will contact our office for additional recommendations. Electronic Signature(s) Signed: 12/04/2018 6:07:29 PM By: Worthy Keeler PA-C Signed: 12/04/2018 6:27:24 PM By: Baruch Gouty RN, BSN Previous Signature: 12/03/2018 6:48:11 PM Version By: Worthy Keeler PA-C Entered By: Baruch Gouty on 12/04/2018 16:40:02 -------------------------------------------------------------------------------- HxROS Details Patient Name: Date of Service: Penny Perez, Penny Perez 12/03/2018 1:15 PM Medical Record [id number] Patient Account Number: [id number] Date of Birth/Sex: Treating RN: July 14, 1932 (85 y.o. Female) Levan Hurst Primary Care Provider: [name] Other Clinician: Referring Provider: Treating Provider/Extender:Stone III, Lemar Livings, Esperanza Richters in Treatment: 0 Information Obtained From Patient Constitutional Symptoms (General Health) Complaints and Symptoms: Negative for: Fatigue; Fever; Chills; Marked Weight Change Ear/Nose/Mouth/Throat Complaints and Symptoms: Negative for: Chronic sinus problems or rhinitis Gastrointestinal Complaints and Symptoms: Negative for: Frequent diarrhea; Nausea; Vomiting Medical History: Past Medical History Notes: GERD Endocrine Complaints and Symptoms: Negative for: Heat/cold intolerance Medical History: Negative for: Type I Diabetes; Type II Diabetes Past  Medical History Notes: Hypothyroidism Genitourinary Complaints and Symptoms: Negative for: Frequent urination Integumentary (Skin) Complaints and Symptoms: Positive for: Wounds - wound on right gluteus Medical History: Negative for: History of Burn Neurologic Complaints and Symptoms: Negative for: Numbness/parasthesias Psychiatric Complaints and Symptoms: Negative for: Claustrophobia; Suicidal Eyes Medical History: Positive for: Cataracts Hematologic/Lymphatic Respiratory Medical History: Positive for: Asthma Past Medical History Notes: Allergic Rhinitis Cardiovascular Medical History: Positive for: Hypertension Past Medical History Notes: Carotid Bruit Immunological Musculoskeletal Medical History: Positive for: Osteoarthritis Past Medical History Notes: Fibromyalgia, Degenerative Disc Disease, bilateral hip replacements Oncologic HBO Extended History Items Eyes: Cataracts Immunizations Pneumococcal Vaccine: Received Pneumococcal Vaccination: Yes Implantable Devices None Family and Social History Cancer: Yes - Father,Mother; Diabetes: No; Heart Disease: No; Hereditary Spherocytosis: No; Hypertension: No; Kidney Disease: No; Lung Disease: Yes - Mother; Seizures: No; Stroke: No; Thyroid Problems: No; Tuberculosis: No; Never smoker; Marital Status - Divorced; Alcohol Use: Never; Drug Use: No History; Caffeine Use: Never; Financial Concerns: No; Food, Clothing or Shelter Needs: No; Support System Lacking: No; Transportation Concerns: No Electronic Signature(s) Signed: 12/03/2018 6:48:11 PM By: Worthy Keeler PA-C Signed: 12/08/2018 5:46:53 PM By: Levan Hurst RN, BSN Entered By: Levan Hurst on 12/03/2018 13:57:41 -------------------------------------------------------------------------------- [place] Details Patient Name: Date of Service: Penny Perez, Penny Perez 12/03/2018 Medical Record [id number] Patient Account Number: [id number] Date of  Birth/Sex: Treating RN: 07-15-1932 (85 y.o. 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 I10 Essential (primary) hypertension Facility Procedures CPT4 Code Description: [id number] - WOUND CARE VISIT-LEV 3 EST PT Modifier: 25 Quantity: 1 Physician Procedures CPT4 Code Description: [id number] - WC PHYS LEVEL 4 - NEW PT ICD-10 Diagnosis Description [id number] Unspecified open wound of  right buttock, initial enco L98.418 Non-pressure chronic ulcer of buttock with other spec I10 Essential (primary) hypertension Modifier: 25 unter ified severity Quantity: 1 CPT4 Code Description: [id number] - WC PHYS SUBQ TISS 20 SQ CM ICD-10 Diagnosis Description L98.418 Non-pressure chronic ulcer of buttock with other specif Modifier: ied severity Quantity: 1 Electronic Signature(s) Signed: 12/03/2018 6:48:11 PM By: Worthy Keeler PA-C Entered By: Worthy Keeler on 12/03/2018 15:09:23

## 2018-12-10 ENCOUNTER — Other Ambulatory Visit: Payer: Self-pay

## 2018-12-10 ENCOUNTER — Encounter (HOSPITAL_BASED_OUTPATIENT_CLINIC_OR_DEPARTMENT_OTHER): Payer: Medicare Other | Admitting: Physician Assistant

## 2018-12-10 DIAGNOSIS — S31819A Unspecified open wound of right buttock, initial encounter: Secondary | ICD-10-CM | POA: Diagnosis not present

## 2018-12-10 NOTE — Progress Notes (Addendum)
AMA, MCFERRIN ([id number]) Visit Report for 12/10/2018 Arrival Information Details Patient Name: Date of Service: Penny Perez, Penny Perez 12/10/2018 1:30 PM Medical Record [id number] Patient Account Number: [id number] Date of Birth/Sex: Treating RN: [birth year] (85 y.o. [name], [name] Gerik Coberly: [name] Other Clinician: Referring Alazae Crymes: Treating Jhony Antrim/Extender:Stone III, Lemar Livings, Esperanza Richters in Treatment: 1 Visit Information History Since Last Visit Added or deleted any medications: No Patient Arrived: Ambulatory Any new allergies or adverse reactions: No Arrival Time: 13:56 Had a fall or experienced change in No Accompanied By: self activities of daily living that may affect Transfer Assistance: None risk of falls: Patient Identification Verified: Yes Signs or symptoms of abuse/neglect since last No Secondary Verification Process Yes visito Completed: Hospitalized since last visit: No Patient Requires Transmission-Based No Implantable device outside of the clinic excluding No Precautions: cellular tissue based products placed in the center Patient Has Alerts: No since last visit: Has Dressing in Place as Prescribed: Yes Pain Present Now: No Electronic Signature(s) Signed: 12/10/2018 5:42:00 PM By: Deon Pilling Entered By: Deon Pilling on 12/10/2018 13:56:40 -------------------------------------------------------------------------------- Encounter Discharge Information Details Patient Name: Date of Service: Penny Perez, Penny Perez 12/10/2018 1:30 PM Medical Record [id number] Patient Account Number: [id number] Date of Birth/Sex: Treating RN: 1932/01/31 (85 y.o. [name] Primary Care Arsalan Brisbin: [name] Other Clinician: Referring Annalina Needles: Treating Alver Leete/Extender:Stone III, Lemar Livings, Esperanza Richters in Treatment: 1 Encounter Discharge Information Items Post Procedure Vitals Discharge Condition:  Stable Temperature (F): 98.4 Ambulatory Status: Cane Pulse (bpm): 65 Discharge Destination: Home Respiratory Rate (breaths/min): 18 Transportation: Private Auto Blood Pressure (mmHg): 115/46 Accompanied By: self Schedule Follow-up Appointment: Yes Clinical Summary of Care: Patient Declined Electronic Signature(s) Signed: 12/11/2018 5:16:39 PM By: Baruch Gouty RN, BSN Entered By: Baruch Gouty on 12/11/2018 09:30:00 -------------------------------------------------------------------------------- Lower Extremity Assessment Details Patient Name: Date of Service: Penny Perez, Penny Perez 12/10/2018 1:30 PM Medical Record [id number] Patient Account Number: [id number] Date of Birth/Sex: Treating RN: [birth year] (85 y.o. Debby Bud Primary Care Tymar Polyak: [name] Other Clinician: Referring Samit Sylve: Treating Malayah Demuro/Extender:Stone III, Lemar Livings, Esperanza Richters in Treatment: 1 Electronic Signature(s) Signed: 12/10/2018 5:42:00 PM By: Deon Pilling Entered By: Deon Pilling on 12/10/2018 13:57:52 -------------------------------------------------------------------------------- Multi-Disciplinary Care Plan Details Patient Name: Date of Service: Penny Perez, Penny Perez 12/10/2018 1:30 PM Medical Record [id number] Patient Account Number: [id number] Date of Birth/Sex: Treating RN: [birth year] (85 y.o. [name] Primary Care Diamone Whistler: [name] Other Clinician: Referring Lawanda Holzheimer: Treating Zuriel Yeaman/Extender:Stone III, Lemar Livings, Esperanza Richters in Treatment: 1 Active Inactive Malignancy/Atypical Etiology Nursing Diagnoses: Knowledge deficit related to disease process and management of atypical ulcer etiology Goals: Patient/caregiver will verbalize understanding of disease process and disease management of atypical ulcer etiology Date Initiated: 12/03/2018 Target Resolution Date: 12/31/2018 Goal Status: Active Interventions: Assess patient and  family medical history for signs and symptoms of malignancy/atypical etiology upon admission Provide education on atypical ulcer etiologies Notes: Wound/Skin Impairment Nursing Diagnoses: Impaired tissue integrity Knowledge deficit related to ulceration/compromised skin integrity Goals: Patient/caregiver will verbalize understanding of skin care regimen Date Initiated: 12/03/2018 Target Resolution Date: 12/31/2018 Goal Status: Active Ulcer/skin breakdown will have a volume reduction of 30% by week 4 Date Initiated: 12/03/2018 Target Resolution Date: 12/31/2018 Goal Status: Active Interventions: Assess patient/caregiver ability to obtain necessary supplies Assess patient/caregiver ability to perform ulcer/skin care regimen upon admission and as needed Assess ulceration(s) every visit Treatment Activities: Skin care regimen initiated : 12/03/2018 Topical wound management initiated : 12/03/2018 Notes: Electronic Signature(s) Signed: 12/10/2018 5:49:26 PM By: Baruch Gouty RN, BSN Entered By: Baruch Gouty on 12/10/2018  14:41:31 -------------------------------------------------------------------------------- Pain Assessment Details Patient Name: Date of Service: Penny Perez, Penny Perez 12/10/2018 1:30 PM Medical Record [id number] Patient Account Number: [id number] Date of Birth/Sex: Treating RN: 05-15-1932 (85 y.o. [name], [name] Reinhold Rickey: [name] Other Clinician: Referring Jorah Hua: Treating Jaqwan Wieber/Extender:Stone III, Lemar Livings, Esperanza Richters in Treatment: 1 Active Problems Location of Pain Severity and Description of Pain Patient Has Paino No Site Locations Pain Management and Medication Current Pain Management: Medication: No Cold Application: No Rest: No Massage: No Activity: No T.E.N.S.: No Heat Application: No Leg drop or elevation: No Is the Current Pain Management Adequate: Adequate How does your wound impact your activities of  daily livingo Sleep: No Bathing: No Appetite: No Relationship With Others: No Bladder Continence: No Emotions: No Bowel Continence: No Work: No Toileting: No Drive: No Dressing: No Hobbies: No Electronic Signature(s) Signed: 12/10/2018 5:42:00 PM By: Deon Pilling Entered By: Deon Pilling on 12/10/2018 13:57:47 -------------------------------------------------------------------------------- Patient/Caregiver Education Details Patient Name: Penny Perez 11/11/2020andnbsp1:30 Date of Service: PM Medical Record [id number] Number: Patient Account Number: [id number] Treating RN: Date of Birth/Gender: [birth year] (85 y.o. [name]) Other Clinician: Primary Care Physician:Badger, Juluis Rainier Referring Physician: Physician/Extender: Lorelle Gibbs in Treatment: 1 Education Assessment Education Provided To: Patient Education Topics Provided Wound/Skin Impairment: Methods: Explain/Verbal Responses: Reinforcements needed, State content correctly Electronic Signature(s) Signed: 12/10/2018 5:49:26 PM By: Baruch Gouty RN, BSN Entered By: Baruch Gouty on 12/10/2018 14:41:53 -------------------------------------------------------------------------------- Wound Assessment Details Patient Name: Date of Service: Penny Perez, Penny Perez 12/10/2018 1:30 PM Medical Record [id number] Patient Account Number: [id number] Date of Birth/Sex: Treating RN: 09-13-1932 (85 y.o. [name] Primary Care Zayveon Raschke: [name] Other Clinician: Referring Ruffin Lada: Treating Donisha Hoch/Extender:Stone III, Lemar Livings, Esperanza Richters in Treatment: 1 Wound Status Wound Number: 1 Primary Atypical Etiology: Wound Location: Right Gluteus Wound Open Wounding Event: Gradually Appeared Status: Date Acquired: 07/30/2018 Comorbid Cataracts, Asthma, Hypertension, Weeks Of Treatment: 1 History: Osteoarthritis Clustered Wound: No Photos Wound  Measurements Length: (cm) 0.7 % Reduct Width: (cm) 1.4 % Reduct Depth: (cm) 0.7 Epitheli Area: (cm) 0.77 Tunneli Volume: (cm) 0.539 Undermi Wound Description Full Thickness Without Exposed Support Foul Od Classification: Structures Slough/ Wound Flat and Intact Margin: Exudate Medium Amount: Exudate Serosanguineous Type: Exudate red, brown Color: Wound Bed Granulation Amount: None Present (0%) Necrotic Amount: Large (67-100%) Fascia Exp Necrotic Quality: Adherent Slough Fat Layer Tendon Exp Muscle Exp Joint Expo Bone Expos or After Cleansing: No Fibrino Yes Exposed Structure osed: No (Subcutaneous Tissue) Exposed: Yes osed: No osed: No sed: No ed: No ion in Area: 27.9% ion in Volume: 37% alization: Small (1-33%) ng: No ning: No Treatment Notes Wound #1 (Right Gluteus) 3. Primary Dressing Applied Calcium Alginate Ag 4. Secondary Dressing Foam Border Dressing Electronic Signature(s) Signed: 12/12/2018 4:02:07 PM By: Mikeal Hawthorne EMT/HBOT Signed: 12/12/2018 5:20:07 PM By: Baruch Gouty RN, BSN Previous Signature: 12/10/2018 5:49:26 PM Version By: Baruch Gouty RN, BSN Entered By: Mikeal Hawthorne on 12/12/2018 08:38:47 -------------------------------------------------------------------------------- Vitals Details Patient Name: Date of Service: Penny Perez, Penny Perez 12/10/2018 1:30 PM Medical Record [id number] Patient Account Number: [id number] Date of Birth/Sex: Treating RN: [birth year] (85 y.o. [name], Tammi Klippel Primary Care Safaa Stingley: [name] Other Clinician: Referring Choua Chalker: Treating Annie Saephan/Extender:Stone III, Lemar Livings, Esperanza Richters in Treatment: 1 Vital Signs Time Taken: 13:58 Temperature (F): 98.4 Height (in): 60 Pulse (bpm): 65 Weight (lbs): 115 Respiratory Rate (breaths/min): 16 Body Mass Index (BMI): 22.5 Blood Pressure (mmHg): 115/46 Reference Range: 80 - 120 mg / dl Electronic Signature(s) Signed: 12/10/2018  5:42:00 PM  By: Deon Pilling Entered By: Deon Pilling on 12/10/2018 13:59:10

## 2018-12-10 NOTE — Progress Notes (Signed)
Penny Perez ([id number]) Visit Report for 12/10/2018 Chief Complaint Document Details Patient Name: Date of Service: Penny Perez 12/10/2018 1:30 PM Medical Record [id number] Patient Account Number: [id number] Date of Birth/Sex: Treating RN: Oct 15, 1932 (85 y.o. Penny Perez: [name] Other Clinician: Referring Perez: Treating Perez/Extender:Stone III, Lemar Livings, Esperanza Richters in Treatment: 1 Information Obtained from: Patient Chief Complaint Right buttock ulcer Electronic Signature(s) Signed: 12/10/2018 5:02:14 PM By: Worthy Keeler PA-C Entered By: Worthy Keeler on 12/10/2018 14:13:50 -------------------------------------------------------------------------------- Debridement Details Patient Name: Date of Service: Penny Perez 12/10/2018 1:30 PM Medical Record [id number] Patient Account Number: [id number] Date of Birth/Sex: [birth year] (85 y.o. F) Treating RN: Baruch Gouty Primary Care Perez: [name] Other Clinician: Referring Perez: Treating Perez/Extender:Stone III, Lemar Livings, Esperanza Richters in Treatment: 1 Debridement Performed for Wound #1 Right Gluteus Assessment: Performed By: Physician Worthy Keeler, PA Debridement Type: Debridement Level of Consciousness (Pre- Awake and Alert procedure): Pre-procedure Verification/Time Out Taken: Yes - 14:40 Start Time: 14:42 Pain Control: Lidocaine 4% Topical Solution Total Area Debrided (L x W): 0.7 (cm) x 1.4 (cm) = 0.98 (cm) Tissue and other material Viable, Non-Viable, Slough, Subcutaneous, Slough debrided: Level: Skin/Subcutaneous Tissue Debridement Description: Excisional Instrument: Curette Bleeding: Minimum Hemostasis Achieved: Pressure End Time: 14:44 Procedural Pain: 4 Post Procedural Pain: 2 Response to Treatment: Procedure was tolerated well Level of Consciousness Awake and Alert (Post-procedure): Post  Debridement Measurements of Total Wound Length: (cm) 0.7 Width: (cm) 1.4 Depth: (cm) 0.7 Volume: (cm) 0.539 Character of Wound/Ulcer Post Requires Further Debridement Debridement: Post Procedure Diagnosis Same as Pre-procedure Electronic Signature(s) Signed: 12/10/2018 5:02:14 PM By: Worthy Keeler PA-C Signed: 12/10/2018 5:49:26 PM By: Baruch Gouty RN, BSN Entered By: Baruch Gouty on 12/10/2018 14:45:26 -------------------------------------------------------------------------------- HPI Details Patient Name: Date of Service: Penny Perez 12/10/2018 1:30 PM Medical Record [id number] Patient Account Number: [id number] Date of Birth/Sex: Treating RN: 10-Apr-1932 (85 y.o. Penny Perez: [name] Other Clinician: Referring Perez: Treating Perez/Extender:Stone III, Lemar Livings, Esperanza Richters in Treatment: 1 History of Present Illness HPI Description: 12/03/2018 on evaluation today patient appears for initial inspection here in our clinic concerning an issue that she has been having a light having in the right gluteal region with an ulcer which initially she describes as having been a knot which when she went to see the dermatologist was biopsied and this was on 08/26/2018. Subsequently that revealed what appeared to be an inflamed ulcer with granulation tissue. Fortunately there was no signs of active infection based on what they saw on pathology. The patient states that since the biopsy she has been having a lot of pain. Fortunately there is no signs of active infection at this time no fever chills noted. She does have a history of hypertension but really no major medical problems significant otherwise although she does have a lot of allergies including allergies to [facility] as well as antibiotics. In fact she has some ulcers in her mouth secondary to having been prescribed an antibiotic for the current ulcer. She states she  has been having trouble sleeping due to the pain in the ulcer area. 12/10/2018 on evaluation today patient's wound actually showed signs of improvement as far as the loosening up of the necrotic tissue is concerned at this point. I do believe that that has done excellent at this point I think that we need to try and clear away some of the necrotic tissue in the base of the wound I discussed that with the  patient as well. Fortunately she is not having any significant pain and states the Santyl did not burn although in the past she notes that it did cause her some trouble with the ulcer on her leg causing some burning. No fevers, chills, nausea, vomiting, or diarrhea. Electronic Signature(s) Signed: 12/10/2018 5:02:14 PM By: Worthy Keeler PA-C Entered By: Worthy Keeler on 12/10/2018 14:50:19 -------------------------------------------------------------------------------- Physical Exam Details Patient Name: Date of Service: Penny Perez 12/10/2018 1:30 PM Medical Record [id number] Patient Account Number: [id number] Date of Birth/Sex: Treating RN: [birth year] (85 y.o. Penny Perez: [name] Other Clinician: Referring Perez: Treating Perez/Extender:Stone III, Lemar Livings, Esperanza Richters in Treatment: 1 Constitutional Thin and well-hydrated in no acute distress. Respiratory normal breathing without difficulty. Psychiatric this patient is able to make decisions and demonstrates good insight into disease process. Alert and Oriented x 3. pleasant and cooperative. Notes Upon inspection patient's wound bed did require sharp debridement and I was able to clean away some of the necrotic tissue at the base of the wound in fact I was able to clean the majority away which revealed somewhat of a cavity that extended towards midline at this point. Once I cleaned all this away however the base of the wound seems to be healthy and doing well I  think that we are getting need to pack this area and being the location what it is I think she is going to need someone to help her with this we can work on getting her a home Programmer, applications. Electronic Signature(s) Signed: 12/10/2018 5:02:14 PM By: Worthy Keeler PA-C Entered By: Worthy Keeler on 12/10/2018 14:50:58 -------------------------------------------------------------------------------- Physician Orders Details Patient Name: Date of Service: MCNEILLTawan, Zaccheo 12/10/2018 1:30 PM Medical Record [id number] Patient Account Number: [id number] Date of Birth/Sex: Treating RN: 1932/04/29 (85 y.o. 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 Dressing Change Frequency Wound #1 Right Gluteus Change dressing three times week. Wound Cleansing Wound #1 Right Gluteus May shower and wash wound with soap and water. - with dressing changes Primary Wound Dressing Wound #1 Right Gluteus Calcium Alginate with Silver - pack lightly into undermining Secondary Dressing Wound #1 Right Gluteus Foam Border - or large Lacey to [facility] - for wound care Mon and Friday Electronic Signature(s) Signed: 12/10/2018 5:02:14 PM By: Worthy Keeler PA-C Signed: 12/10/2018 5:49:26 PM By: Baruch Gouty RN, BSN Entered By: Baruch Gouty on 12/10/2018 14:48:04 -------------------------------------------------------------------------------- Problem List Details Patient Name: Date of Service: MCNEILLMarkeia, Kunz  12/10/2018 1:30 PM Medical Record [id number] Patient Account Number: [id number] Date of Birth/Sex: Treating RN: 05-12-1932 (85 y.o. 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 Penny Perez: [name] Other Clinician: Referring Perez: Treating Perez/Extender:Stone III, Lemar Livings, Esperanza Richters in Treatment: 1 Subjective Chief Complaint Information obtained from Patient Right buttock ulcer History of Present Illness (HPI) 12/03/2018 on evaluation today patient appears for initial inspection here in our clinic concerning an issue that she has been having a light having in the right gluteal region with an ulcer which initially she describes as having been a knot which when she went to see the dermatologist was biopsied and this was on 08/26/2018. Subsequently that revealed what appeared to be an inflamed ulcer with granulation tissue. Fortunately there was no signs of active infection based on what they saw on pathology. The patient  states that since the biopsy she has been having a lot of pain. Fortunately there is no signs of active infection at this time no fever chills noted. She does have a history of hypertension but really no major medical problems significant otherwise although she does have a lot of allergies including allergies to [facility] as well as antibiotics. In fact she has some ulcers in her mouth secondary to having been prescribed an antibiotic for the current ulcer. She states she has been having trouble sleeping due to the pain in the ulcer area. 12/10/2018 on evaluation today patient's wound actually showed signs of improvement as far as the loosening up of the necrotic tissue is concerned at this point. I do believe that that has done excellent at this point I think that we need to try and clear away some of the necrotic tissue in the base of the wound I discussed that with the patient as well. Fortunately she is not having any significant pain and states the Santyl did not burn although in the past she notes that it did cause her some trouble with the ulcer on her leg causing some burning. No fevers, chills, nausea, vomiting, or diarrhea. Patient History Information obtained from Patient. Family History Cancer - Father,Mother, Lung Disease - Mother, No family history of Diabetes, Heart Disease, Hereditary Spherocytosis, Hypertension, Kidney Disease, Seizures, Stroke, Thyroid Problems, Tuberculosis. Social History Never smoker, Marital Status - Divorced, Alcohol Use - Never, Drug Use - No History, Caffeine Use - Never. Medical History Eyes Patient has history of Cataracts Respiratory Patient has history of Asthma Cardiovascular Patient has history of Hypertension Endocrine Denies history of Type I Diabetes, Type II Diabetes Integumentary (Skin) Denies history of History of Burn Musculoskeletal Patient has history of Osteoarthritis Medical And Surgical History Notes Respiratory Allergic  Rhinitis Cardiovascular Carotid Bruit Gastrointestinal GERD Endocrine Hypothyroidism Musculoskeletal Fibromyalgia, Degenerative Disc Disease, bilateral hip replacements Review of Systems (ROS) Constitutional Symptoms (General Health) Denies complaints or symptoms of Fatigue, Fever, Chills, Marked Weight Change. Respiratory Denies complaints or symptoms of Chronic or frequent coughs, Shortness of Breath. Cardiovascular Denies complaints or symptoms of Chest pain. Psychiatric Denies complaints or symptoms of Claustrophobia, Suicidal. Objective Constitutional Thin and well-hydrated in no acute distress. Vitals Time Taken: 1:58 PM, Height: 60 in, Weight: 115 lbs, BMI: 22.5, Temperature: 98.4 F, Pulse: 65 bpm, Respiratory Rate: 16 breaths/min, Blood Pressure: 115/46 mmHg. Respiratory normal breathing without difficulty. Psychiatric this patient is able to make decisions and demonstrates good insight into disease process. Alert and Oriented x 3. pleasant and cooperative. General Notes: Upon inspection patient's wound bed did require sharp debridement and I  was able to clean away some of the necrotic tissue at the base of the wound in fact I was able to clean the majority away which revealed somewhat of a cavity that extended towards midline at this point. Once I cleaned all this away however the base of the wound seems to be healthy and doing well I think that we are getting need to pack this area and being the location what it is I think she is going to need someone to help her with this we can work on getting her a home Programmer, applications. Integumentary (Hair, Skin) Wound #1 status is Open. Original cause of wound was Gradually Appeared. The wound is located on the Right Gluteus. The wound measures 0.7cm length x 1.4cm width x 0.7cm depth; 0.77cm^2 area and 0.539cm^3 volume. There is Fat Layer (Subcutaneous Tissue) Exposed exposed. There is no tunneling or undermining noted. There is  a medium amount of serosanguineous drainage noted. The wound margin is flat and intact. There is no granulation within the wound bed. There is a large (67-100%) amount of necrotic tissue within the wound bed including Adherent Slough. 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 There was a Excisional Skin/Subcutaneous Tissue Debridement with a total area of 0.98 sq cm performed by Worthy Keeler, PA. With the following instrument(s): Curette to remove Viable and Non-Viable tissue/material. Material removed includes Subcutaneous Tissue and Slough and after achieving pain control using Lidocaine 4% Topical Solution. No specimens were taken. A time out was conducted at 14:40, prior to the start of the procedure. A Minimum amount of bleeding was controlled with Pressure. The procedure was tolerated well with a pain level of 4 throughout and a pain level of 2 following the procedure. Post Debridement Measurements: 0.7cm length x 1.4cm width x 0.7cm depth; 0.539cm^3 volume. Character of Wound/Ulcer Post Debridement requires further debridement. Post procedure Diagnosis Wound #1: Same as Pre-Procedure Plan Follow-up Appointments: Return Appointment in 1 week. Dressing Change Frequency: Wound #1 Right Gluteus: Change dressing three times week. Wound Cleansing: Wound #1 Right Gluteus: May shower and wash wound with soap and water. - with dressing changes Primary Wound Dressing: Wound #1 Right Gluteus: Calcium Alginate with Silver - pack lightly into undermining Secondary Dressing: Wound #1 Right Gluteus: Foam Border - or large bandaid Home Health: Admit to [facility] - for wound care Mon and Friday 1. I would  recommend currently that we initiate packing with a silver alginate dressing I think that will be a good option for her at this point. 2. I am also going to suggest that we continue to clean the area with saline and gauze. 3. We will use a border foam dressing in order to pad the wound area and hopefully provide some cushioning as well. We will see patient back for reevaluation in 1 week here in the clinic. If anything worsens or changes patient will contact our office for additional recommendations. Electronic Signature(s) Signed: 12/10/2018 5:02:14 PM By: Worthy Keeler PA-C Entered By: Worthy Keeler on 12/10/2018 14:51:25 -------------------------------------------------------------------------------- HxROS Details Patient Name: Date of Service: Al[place], [place] 12/10/2018 1:30 PM Medical Record [id number] Patient Account Number: [id number] Date of Birth/Sex: Treating RN: 1932/04/27 (85 y.o. Penny Perez: [name] Other Clinician: Referring Perez: Treating Perez/Extender:Stone  III, Lemar Livings, Legrand Como Weeks in Treatment: 1 Information Obtained From Patient Constitutional Symptoms (General Health) Complaints and Symptoms: Negative for: Fatigue; Fever; Chills; Marked Weight Change Respiratory Complaints and Symptoms: Negative for: Chronic or frequent coughs; Shortness of Breath Medical History: Positive for: Asthma Past Medical History Notes: Allergic Rhinitis Cardiovascular Complaints and Symptoms: Negative for: Chest pain Medical History: Positive for: Hypertension Past Medical History Notes: Carotid Bruit Psychiatric Complaints and Symptoms: Negative for: Claustrophobia; Suicidal Eyes Medical History: Positive for: Cataracts Gastrointestinal Medical History: Past Medical History Notes: GERD Endocrine Medical History: Negative for: Type I Diabetes; Type II Diabetes Past Medical History  Notes: Hypothyroidism Integumentary (Skin) Medical History: Negative for: History of Burn Musculoskeletal Medical History: Positive for: Osteoarthritis Past Medical History Notes: Fibromyalgia, Degenerative Disc Disease, bilateral hip replacements HBO Extended History Items Eyes: Cataracts Immunizations Pneumococcal Vaccine: Received Pneumococcal Vaccination: Yes Implantable Devices None Family and Social History Cancer: Yes - Father,Mother; Diabetes: No; Heart Disease: No; Hereditary Spherocytosis: No; Hypertension: No; Kidney Disease: No; Lung Disease: Yes - Mother; Seizures: No; Stroke: No; Thyroid Problems: No; Tuberculosis: No; Never smoker; Marital Status - Divorced; Alcohol Use: Never; Drug Use: No History; Caffeine Use: Never; Financial Concerns: No; Food, Clothing or Shelter Needs: No; Support System Lacking: No; Transportation Concerns: No Physician Affirmation I have reviewed and agree with the above information. Electronic Signature(s) Signed: 12/10/2018 5:02:14 PM By: Worthy Keeler PA-C Signed: 12/10/2018 5:49:26 PM By: Baruch Gouty RN, BSN Entered By: Worthy Keeler on 12/10/2018 14:50:40 -------------------------------------------------------------------------------- SuperBill Details Patient Name: Date of Service: CORI, ALEXIE 12/10/2018 Medical Record [id number] Patient Account Number: [id number] Date of Birth/Sex: Treating RN: [birth year] (85 y.o. 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

## 2018-12-17 ENCOUNTER — Other Ambulatory Visit: Payer: Self-pay

## 2018-12-17 ENCOUNTER — Encounter (HOSPITAL_BASED_OUTPATIENT_CLINIC_OR_DEPARTMENT_OTHER): Payer: Medicare Other | Admitting: Physician Assistant

## 2018-12-17 DIAGNOSIS — S31819A Unspecified open wound of right buttock, initial encounter: Secondary | ICD-10-CM | POA: Diagnosis not present

## 2018-12-17 NOTE — Progress Notes (Signed)
Penny Perez, Penny Perez ([id number]) Visit Report for 12/17/2018 Chief Complaint Document Details Patient Name: Date of Service: Penny Perez, Penny Perez 12/17/2018 11:00 AM Medical Record [id number] Patient Account Number: [id number] Date of Birth/Sex: Treating RN: [birth year] (85 y.o. [name] Primary Care Provider: [name] Other Clinician: Referring Provider: Treating Provider/Extender:Stone III, Lemar Livings, Esperanza Richters in Treatment: 2 Information Obtained from: Patient Chief Complaint Right buttock ulcer Electronic Signature(s) Signed: 12/17/2018 5:11:42 PM By: Worthy Keeler PA-C Entered By: Worthy Keeler on 12/17/2018 11:27:55 -------------------------------------------------------------------------------- Debridement Details Patient Name: Date of Service: Penny Perez, Penny Perez 12/17/2018 11:00 AM Medical Record [id number] Patient Account Number: [id number] Date of Birth/Sex: 01-04-1933 (85 y.o. F) Treating RN: Baruch Gouty Primary Care Provider: [name] Other Clinician: Referring Provider: Treating Provider/Extender:Stone III, Lemar Livings, Esperanza Richters in Treatment: 2 Debridement Performed for Wound #1 Right Gluteus Assessment: Performed By: Physician Worthy Keeler, PA Debridement Type: Debridement Level of Consciousness (Pre- Awake and Alert procedure): Pre-procedure Verification/Time Out Taken: Yes - 12:40 Start Time: 12:40 Pain Control: Lidocaine 4% Topical Solution Total Area Debrided (L x W): 1 (cm) x 1.1 (cm) = 1.1 (cm) Tissue and other material Viable, Non-Viable, Slough, Subcutaneous, Slough debrided: Level: Skin/Subcutaneous Tissue Debridement Description: Excisional Instrument: Curette Bleeding: Minimum Hemostasis Achieved: Pressure End Time: 12:43 Procedural Pain: 2 Post Procedural Pain: 1 Response Penny Perez Treatment: Procedure was tolerated well Level of Consciousness Awake and Alert (Post-procedure): Post  Debridement Measurements of Total Wound Length: (cm) 1 Width: (cm) 1.1 Depth: (cm) 1.2 Volume: (cm) 1.037 Character of Wound/Ulcer Post Improved Debridement: Post Procedure Diagnosis Same as Pre-procedure Electronic Signature(s) Signed: 12/17/2018 5:11:42 PM By: Worthy Keeler PA-C Signed: 12/17/2018 5:42:21 PM By: Baruch Gouty RN, BSN Entered By: Baruch Gouty on 12/17/2018 12:41:36 -------------------------------------------------------------------------------- HPI Details Patient Name: Date of Service: Penny Perez, Penny Perez 12/17/2018 11:00 AM Medical Record [id number] Patient Account Number: [id number] Date of Birth/Sex: Treating RN: 06/04/1932 (85 y.o. [name] Primary Care Provider: [name] Other Clinician: Referring Provider: Treating Provider/Extender:Stone III, Lemar Livings, Esperanza Richters in Treatment: 2 History of Present Illness HPI Description: 12/03/2018 on evaluation today patient appears for initial inspection here in our clinic concerning an issue that she has been having a light having in the right gluteal region with an ulcer which initially she describes as having been a knot which when she went Penny Perez see the dermatologist was biopsied and this was on 08/26/2018. Subsequently that revealed what appeared Penny Perez be an inflamed ulcer with granulation tissue. Fortunately there was no signs of active infection based on what they saw on pathology. The patient states that since the biopsy she has been having a lot of pain. Fortunately there is no signs of active infection at this time no fever chills noted. She does have a history of hypertension but really no major medical problems significant otherwise although she does have a lot of allergies including allergies Penny Perez [facility] as well as antibiotics. In fact she has some ulcers in her mouth secondary Penny Perez having been prescribed an antibiotic for the current ulcer. She states she has been having  trouble sleeping due Penny Perez the pain in the ulcer area. 12/10/2018 on evaluation today patient's wound actually showed signs of improvement as far as the loosening up of the necrotic tissue is concerned at this point. I do believe that that has done excellent at this point I think that we need Penny Perez try and clear away some of the necrotic tissue in the base of the wound I discussed that with the patient as  well. Fortunately she is not having any significant pain and states the Santyl did not burn although in the past she notes that it did cause her some trouble with the ulcer on her leg causing some burning. No fevers, chills, nausea, vomiting, or diarrhea. 12/17/2018 on evaluation today patient appears Penny Perez be doing well with regard Penny Perez her wound. This does not appear Penny Perez be any more deep than it was after the last evaluation and debridement last week. With that being said there are some necrotic tissue on the surface of the wound which is can require some sharp debridement at this point. Fortunately there is no evidence of active infection at this time which is good news. No fevers, chills, nausea, vomiting, or diarrhea. Electronic Signature(s) Signed: 12/17/2018 5:11:42 PM By: Worthy Keeler PA-C Entered By: Worthy Keeler on 12/17/2018 12:44:43 -------------------------------------------------------------------------------- Physical Exam Details Patient Name: Date of Service: Penny Perez, Penny Perez 12/17/2018 11:00 AM Medical Record [id number] Patient Account Number: [id number] Date of Birth/Sex: Treating RN: [birth year] (85 y.o. [name] Primary Care Provider: [name] Other Clinician: Referring Provider: Treating Provider/Extender:Stone III, Lemar Livings, Esperanza Richters in Treatment: 2 Constitutional Well-nourished and well-hydrated in no acute distress. Respiratory normal breathing without difficulty. Psychiatric this patient is able Penny Perez make decisions and demonstrates  good insight into disease process. Alert and Oriented x 3. pleasant and cooperative. Notes Patient's wound bed currently did require sharp debridement Penny Perez clear away some of the necrotic tissue at this point and she tolerated this without complication. Fortunately there is no signs of active infection at this time which is good news. I do believe that continuing Penny Perez pack the wound as we have been doing is still the best option for her at this point. Electronic Signature(s) Signed: 12/17/2018 5:11:42 PM By: Worthy Keeler PA-C Entered By: Worthy Keeler on 12/17/2018 12:45:28 -------------------------------------------------------------------------------- Physician Orders Details Patient Name: Date of Service: Penny Perez, Penny Perez 12/17/2018 11:00 AM Medical Record [id number] Patient Account Number: [id number] Date of Birth/Sex: Treating RN: [birth year] (85 y.o. 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 Dressing Change Frequency Wound #1 Right Gluteus Change dressing three times week. Wound Cleansing Wound #1 Right Gluteus May shower and wash wound with soap and water. - with dressing changes Primary Wound Dressing Wound #1 Right Gluteus Calcium Alginate with Silver - pack lightly into undermining Secondary Dressing Wound #1 Right Gluteus Foam Border - or large [place] skilled nursing for wound care. Lajean Manes Electronic Signature(s) Signed: 12/17/2018 5:11:42 PM By: Worthy Keeler PA-C Signed:  12/17/2018 5:42:21 PM By: Baruch Gouty RN, BSN Entered By: Baruch Gouty on 12/17/2018 12:42:40 -------------------------------------------------------------------------------- Problem List Details Patient Name: Date of Service: Penny Perez, Penny Perez 12/17/2018 11:00 AM Medical Record [id number] Patient Account Number: [id number] Date of Birth/Sex: Treating RN: [birth year] (85 y.o. 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 [name] Primary Care Provider: [name] Other Clinician: Referring Provider: Treating Provider/Extender:Stone III, Lemar Livings, Esperanza Richters in Treatment: 2 Subjective Chief Complaint Information obtained from Patient Right buttock ulcer History of Present Illness (HPI) 12/03/2018 on evaluation today patient appears for initial inspection here in our clinic concerning an issue that she has been having a light having in the right gluteal region with an ulcer which initially she describes as having been a knot which when she went  Penny Perez see the dermatologist was biopsied and this was on 08/26/2018. Subsequently that revealed what appeared Penny Perez be an inflamed ulcer with granulation tissue. Fortunately there was no signs of active infection based on what they saw on pathology. The patient states that since the biopsy she has been having a lot of pain. Fortunately there is no signs of active infection at this time no fever chills noted. She does have a history of hypertension but really no major medical problems significant otherwise although she does have a lot of allergies including allergies Penny Perez [facility] as well as antibiotics. In fact she has some ulcers in her mouth secondary Penny Perez having been prescribed an antibiotic for the current ulcer. She states she has been having trouble sleeping due Penny Perez the pain in the ulcer area. 12/10/2018 on evaluation today patient's wound actually showed signs of improvement as far as the loosening up of the necrotic tissue is concerned at this point. I do believe that that has done excellent at this point I think that we need Penny Perez try and clear away some of the necrotic tissue in the base of the wound I discussed that with the patient as well. Fortunately she is not having any significant pain and states the Santyl did not burn although in the past she notes that it did cause her some trouble with the ulcer on her leg causing some burning. No fevers, chills, nausea, vomiting, or diarrhea. 12/17/2018 on evaluation today patient appears Penny Perez be doing well with regard Penny Perez her wound. This does not appear Penny Perez be any more deep than it was after the last evaluation and debridement last week. With that being said there are some necrotic tissue on the surface of the wound which is can require some sharp debridement at this point. Fortunately there is no evidence of active infection at this time which is good news. No fevers, chills, nausea, vomiting, or diarrhea. Patient History Information obtained from  Patient. Family History Cancer - Father,Mother, Lung Disease - Mother, No family history of Diabetes, Heart Disease, Hereditary Spherocytosis, Hypertension, Kidney Disease, Seizures, Stroke, Thyroid Problems, Tuberculosis. Social History Never smoker, Marital Status - Divorced, Alcohol Use - Never, Drug Use - No History, Caffeine Use - Never. Medical History Eyes Patient has history of Cataracts Respiratory Patient has history of Asthma Cardiovascular Patient has history of Hypertension Endocrine Denies history of Type I Diabetes, Type II Diabetes Integumentary (Skin) Denies history of History of Burn Musculoskeletal Patient has history of Osteoarthritis Medical And Surgical History Notes Respiratory Allergic Rhinitis Cardiovascular Carotid Bruit Gastrointestinal GERD Endocrine Hypothyroidism Musculoskeletal Fibromyalgia, Degenerative Disc Disease, bilateral hip replacements Review of Systems (ROS) Constitutional Symptoms (General Health) Denies complaints or symptoms of  Fatigue, Fever, Chills, Marked Weight Change. Respiratory Denies complaints or symptoms of Chronic or frequent coughs, Shortness of Breath. Cardiovascular Denies complaints or symptoms of Chest pain. Psychiatric Denies complaints or symptoms of Claustrophobia, Suicidal. Objective Constitutional Well-nourished and well-hydrated in no acute distress. Vitals Time Taken: 11:56 AM, Height: 60 in, Weight: 115 lbs, BMI: 22.5, Temperature: 98.2 F, Pulse: 59 bpm, Respiratory Rate: 16 breaths/min, Blood Pressure: 135/56 mmHg. Respiratory normal breathing without difficulty. Psychiatric this patient is able Penny Perez make decisions and demonstrates good insight into disease process. Alert and Oriented x 3. pleasant and cooperative. General Notes: Patient's wound bed currently did require sharp debridement Penny Perez clear away some of the necrotic tissue at this point and she tolerated this without complication.  Fortunately there is no signs of active infection at this time which is good news. I do believe that continuing Penny Perez pack the wound as we have been doing is still the best option for her at this point. Integumentary (Hair, Skin) Wound #1 status is Open. Original cause of wound was Gradually Appeared. The wound is located on the Right Gluteus. The wound measures 1cm length x 1.1cm width x 1.2cm depth; 0.864cm^2 area and 1.037cm^3 volume. There is Fat Layer (Subcutaneous Tissue) Exposed exposed. There is no undermining noted, however, there is tunneling at 10:00 with a maximum distance of 1.4cm. There is a medium amount of serosanguineous drainage noted. The wound margin is flat and intact. There is small (1-33%) red granulation within the wound bed. There is a large (67-100%) amount of necrotic tissue within the wound bed including Adherent Slough. 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 There was a Excisional Skin/Subcutaneous Tissue Debridement with a total area of 1.1 sq cm performed by Worthy Keeler, PA. With the following instrument(s): Curette Penny Perez remove Viable and Non-Viable tissue/material. Material removed includes Subcutaneous Tissue and Slough and after achieving pain control using Lidocaine 4% Topical Solution. No specimens were taken. A time out was conducted at 12:40, prior Penny Perez the start of the procedure. A Minimum amount of bleeding was controlled with Pressure. The procedure was tolerated well with a pain level of 2 throughout and a pain level of 1 following the procedure. Post Debridement Measurements: 1cm length x 1.1cm width x 1.2cm depth; 1.037cm^3 volume. Character of Wound/Ulcer Post Debridement is improved. Post  procedure Diagnosis Wound #1: Same as Pre-Procedure Plan Follow-up Appointments: Return Appointment in 2 weeks. Dressing Change Frequency: Wound #1 Right Gluteus: Change dressing three times week. Wound Cleansing: Wound #1 Right Gluteus: May shower and wash wound with soap and water. - with dressing changes Primary Wound Dressing: Wound #1 Right Gluteus: Calcium Alginate with Silver - pack lightly into undermining Secondary Dressing: Wound #1 Right Gluteus: Foam Border - or large bandaid Home Health: [place] skilled nursing for wound care. - [facility] 1. I am in a suggest that we go ahead and initiate a continuation of the current wound care measures including the silver alginate packed lightly into the wound and undermining. 2. We will continue Penny Perez cover this with a border foam dressing. 3. I recommend she continue Penny Perez avoid any excessive pressure Penny Perez this region. 4. I am also going Penny Perez suggest that we continue with home health performing the dressing changes until we can see the patient back in 2  weeks. We will see patient back for reevaluation in 2 weeks here in the clinic. If anything worsens or changes patient will contact our office for additional recommendations. Electronic Signature(s) Signed: 12/17/2018 5:11:42 PM By: Worthy Keeler PA-C Entered By: Worthy Keeler on 12/17/2018 12:46:04 -------------------------------------------------------------------------------- HxROS Details Patient Name: Date of Service: Penny Perez, Penny Perez 12/17/2018 11:00 AM Medical Record [id number] Patient Account Number: [id number] Date of Birth/Sex: Treating RN: 07-Jan-1933 (85 y.o. [name] Primary Care Provider: [name] Other Clinician: Referring Provider: Treating Provider/Extender:Stone III, Lemar Livings, Esperanza Richters in Treatment: 2 Information Obtained From Patient Constitutional Symptoms (General Health) Complaints and Symptoms: Negative for:  Fatigue; Fever; Chills; Marked Weight Change Respiratory Complaints and Symptoms: Negative for: Chronic or frequent coughs; Shortness of Breath Medical History: Positive for: Asthma Past Medical History Notes: Allergic Rhinitis Cardiovascular Complaints and Symptoms: Negative for: Chest pain Medical History: Positive for: Hypertension Past Medical History Notes: Carotid Bruit Psychiatric Complaints and Symptoms: Negative for: Claustrophobia; Suicidal Eyes Medical History: Positive for: Cataracts Gastrointestinal Medical History: Past Medical History Notes: GERD Endocrine Medical History: Negative for: Type I Diabetes; Type II Diabetes Past Medical History Notes: Hypothyroidism Integumentary (Skin) Medical History: Negative for: History of Burn Musculoskeletal Medical History: Positive for: Osteoarthritis Past Medical History Notes: Fibromyalgia, Degenerative Disc Disease, bilateral hip replacements HBO Extended History Items Eyes: Cataracts Immunizations Pneumococcal Vaccine: Received Pneumococcal Vaccination: Yes Implantable Devices None Family and Social History Cancer: Yes - Father,Mother; Diabetes: No; Heart Disease: No; Hereditary Spherocytosis: No; Hypertension: No; Kidney Disease: No; Lung Disease: Yes - Mother; Seizures: No; Stroke: No; Thyroid Problems: No; Tuberculosis: No; Never smoker; Marital Status - Divorced; Alcohol Use: Never; Drug Use: No History; Caffeine Use: Never; Financial Concerns: No; Food, Clothing or Shelter Needs: No; Support System Lacking: No; Transportation Concerns: No Physician Affirmation I have reviewed and agree with the above information. Electronic Signature(s) Signed: 12/17/2018 5:11:42 PM By: Worthy Keeler PA-C Signed: 12/17/2018 5:42:21 PM By: Baruch Gouty RN, BSN Entered By: Worthy Keeler on 12/17/2018 12:45:11 -------------------------------------------------------------------------------- SuperBill  Details Patient Name: Date of Service: Penny Perez, Penny Perez 12/17/2018 Medical Record [id number] Patient Account Number: [id number] Date of Birth/Sex: Treating RN: [birth year] (85 y.o. 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

## 2018-12-23 NOTE — Progress Notes (Signed)
Penny Perez ([id number]) Visit Report for 12/17/2018 Arrival Information Details Patient Name: Date of Service: Penny Perez, Penny Perez 12/17/2018 11:00 AM Medical Record [id number] Patient Account Number: [id number] Date of Birth/Sex: Treating RN: [birth year] (85 y.o. [name] Primary Care Gurneet Matarese: [name] Other Clinician: Referring Awesome Jared: Treating Lorenz Donley/Extender:Stone III, Lemar Livings, Esperanza Richters in Treatment: 2 Visit Information History Since Last Visit Added or deleted any medications: No Patient Arrived: Ambulatory Any new allergies or adverse reactions: No Arrival Time: 11:54 Had a fall or experienced change in No Accompanied By: alone activities of daily living that may affect Transfer Assistance: None risk of falls: Patient Identification Verified: Yes Signs or symptoms of abuse/neglect since last No Secondary Verification Process Yes visito Completed: Hospitalized since last visit: No Patient Requires Transmission-Based No Implantable device outside of the clinic excluding No Precautions: cellular tissue based products placed in the center Patient Has Alerts: No since last visit: Has Dressing in Place as Prescribed: Yes Pain Present Now: No Electronic Signature(s) Signed: 12/22/2018 2:18:43 PM By: Levan Hurst RN, BSN Entered By: Levan Hurst on 12/17/2018 11:54:18 -------------------------------------------------------------------------------- [place] Details Patient Name: Date of Service: Penny Perez 12/17/2018 11:00 AM Medical Record [id number] Patient Account Number: [id number] Date of Birth/Sex: Treating RN: 1932/05/17 (85 y.o. [name] Primary Care Tristram Milian: [name] Other Clinician: Referring Shell Blanchette: Treating Jarelle Ates/Extender:Stone III, Lemar Livings, Esperanza Richters in Treatment: 2 Active Inactive Malignancy/Atypical Etiology Nursing Diagnoses: Knowledge  deficit related to disease process and management of atypical ulcer etiology Goals: Patient/caregiver will verbalize understanding of disease process and disease management of atypical ulcer etiology Date Initiated: 12/03/2018 Target Resolution Date: 12/31/2018 Goal Status: Active Interventions: Assess patient and family medical history for signs and symptoms of malignancy/atypical etiology upon admission Provide education on atypical ulcer etiologies Notes: Wound/Skin Impairment Nursing Diagnoses: Impaired tissue integrity Knowledge deficit related to ulceration/compromised skin integrity Goals: Patient/caregiver will verbalize understanding of skin care regimen Date Initiated: 12/03/2018 Target Resolution Date: 12/31/2018 Goal Status: Active Ulcer/skin breakdown will have a volume reduction of 30% by week 4 Date Initiated: 12/03/2018 Target Resolution Date: 12/31/2018 Goal Status: Active Interventions: Assess patient/caregiver ability to obtain necessary supplies Assess patient/caregiver ability to perform ulcer/skin care regimen upon admission and as needed Assess ulceration(s) every visit Treatment Activities: Skin care regimen initiated : 12/03/2018 Topical wound management initiated : 12/03/2018 Notes: Electronic Signature(s) Signed: 12/17/2018 5:42:21 PM By: Baruch Gouty RN, BSN Entered By: Baruch Gouty on 12/17/2018 12:36:50 -------------------------------------------------------------------------------- Pain Assessment Details Patient Name: Date of Service: Penny Perez 12/17/2018 11:00 AM Medical Record [id number] Patient Account Number: [id number] Date of Birth/Sex: Treating RN: 1932-03-18 (85 y.o. [name] Primary Care Aahana Elza: [name] Other Clinician: Referring Janara Klett: Treating Jerral Mccauley/Extender:Stone III, Lemar Livings, Esperanza Richters in Treatment: 2 Active Problems Location of Pain Severity and Description of Pain Patient Has  Paino No Site Locations Pain Management and Medication Current Pain Management: Electronic Signature(s) Signed: 12/22/2018 2:18:43 PM By: Levan Hurst RN, BSN Entered By: Levan Hurst on 12/17/2018 11:54:36 -------------------------------------------------------------------------------- Patient/Caregiver Education Details Patient Name: Penny Perez 11/18/2020andnbsp11:00 Date of Service: AM Medical Record [id number] Number: Patient Account Number: [id number] Treating RN: Date of Birth/Gender: March 13, 1932 (85 y.o. [name]) Other Clinician: Primary Care Treating Melford Aase Heber Louisa Physician: Physician/Extender: Referring Physician: Lorelle Gibbs in Treatment: 2 Education Assessment Education Provided To: Patient Education Topics Provided Wound/Skin Impairment: Methods: Explain/Verbal Responses: Reinforcements needed, State content correctly Electronic Signature(s) Signed: 12/17/2018 5:42:21 PM By: Baruch Gouty RN, BSN Entered By: Baruch Gouty on 12/17/2018 12:37:06 --------------------------------------------------------------------------------  Wound Assessment Details Patient Name: Date of Service: Penny Perez 12/17/2018 11:00 AM Medical Record [id number] Patient Account Number: [id number] Date of Birth/Sex: Treating RN: [birth year] (85 y.o. [name] Primary Care Jyllian Haynie: [name] Other Clinician: Referring Azaylah Stailey: Treating Abrianna Sidman/Extender:Stone III, Lemar Livings, Esperanza Richters in Treatment: 2 Wound Status Wound Number: 1 Primary Atypical Etiology: Wound Location: Right Gluteus Wound Open Wounding Event: Gradually Appeared Status: Date Acquired: 07/30/2018 Comorbid Cataracts, Asthma, Hypertension, Weeks Of Treatment: 2 History: Osteoarthritis Clustered Wound: No Photos Wound Measurements Length: (cm) 1 Width: (cm) 1.1 Depth: (cm) 1.2 Area: (cm) 0.864 Volume: (cm) 1.037 %  Reduction in Area: 19.1% % Reduction in Volume: -21.3% Epithelialization: Small (1-33%) Tunneling: Yes Position (o'clock): 10 Maximum Distance: (cm) 1.4 Undermining: No Wound Description Classification: Full Thickness Without Exposed Support Foul Od Structures Slough/ Wound Flat and Intact Margin: Exudate Medium Amount: Exudate Serosanguineous Type: Exudate red, brown Color: Wound Bed Granulation Amount: Small (1-33%) Granulation Quality: Red Fascia Expos Necrotic Amount: Large (67-100%) Fat Layer (S Necrotic Quality: Adherent Slough Tendon Expos Muscle Expos Joint Expose Bone Exposed or After Cleansing: No Fibrino Yes Exposed Structure ed: No ubcutaneous Tissue) Exposed: Yes ed: No ed: No d: No : No Electronic Signature(s) Signed: 12/22/2018 2:18:43 PM By: Levan Hurst RN, BSN Signed: 12/23/2018 7:37:04 AM By: Mikeal Hawthorne EMT/HBOT Entered By: Mikeal Hawthorne on 12/22/2018 10:54:22 -------------------------------------------------------------------------------- [place] Details Patient Name: Date of Service: [place], [place] 12/17/2018 11:00 AM Medical Record [id number] Patient Account Number: [id number] Date of Birth/Sex: Treating RN: 1933-01-06 (85 y.o. [name] Primary Care Devante Capano: [name] Other Clinician: Referring Kobi Aller: Treating Gladyes Kudo/Extender:Stone III, Lemar Livings, Esperanza Richters in Treatment: 2 Vital Signs Time Taken: 11:56 Temperature (F): 98.2 Height (in): 60 Pulse (bpm): 59 Weight (lbs): 115 Respiratory Rate (breaths/min): 16 Body Mass Index (BMI): 22.5 Blood Pressure (mmHg): 135/56 Reference Range: 80 - 120 mg / dl Electronic Signature(s) Signed: 12/22/2018 2:18:43 PM By: Levan Hurst RN, BSN Entered By: Levan Hurst on 12/17/2018 11:56:31

## 2018-12-31 ENCOUNTER — Encounter (HOSPITAL_BASED_OUTPATIENT_CLINIC_OR_DEPARTMENT_OTHER): Payer: Medicare Other | Attending: Physician Assistant | Admitting: Physician Assistant

## 2018-12-31 ENCOUNTER — Other Ambulatory Visit: Payer: Self-pay

## 2018-12-31 DIAGNOSIS — I10 Essential (primary) hypertension: Secondary | ICD-10-CM | POA: Diagnosis not present

## 2018-12-31 DIAGNOSIS — S31819A Unspecified open wound of right buttock, initial encounter: Secondary | ICD-10-CM | POA: Insufficient documentation

## 2018-12-31 DIAGNOSIS — J45909 Unspecified asthma, uncomplicated: Secondary | ICD-10-CM | POA: Diagnosis not present

## 2018-12-31 DIAGNOSIS — X58XXXA Exposure to other specified factors, initial encounter: Secondary | ICD-10-CM | POA: Diagnosis not present

## 2018-12-31 DIAGNOSIS — M1990 Unspecified osteoarthritis, unspecified site: Secondary | ICD-10-CM | POA: Diagnosis not present

## 2018-12-31 DIAGNOSIS — L98418 Non-pressure chronic ulcer of buttock with other specified severity: Secondary | ICD-10-CM | POA: Insufficient documentation

## 2019-01-01 NOTE — Progress Notes (Signed)
HESPER, WIMBUSH ([id number]) Visit Report for 12/31/2018 Chief Complaint Document Details Patient Name: Date of Service: Penny Perez, Penny Perez 12/31/2018 3:45 PM Medical Record [id number] Patient Account Number: [id number] Date of Birth/Sex: Treating RN: [birth year] (85 y.o. F) Primary Care Provider: [name] Other Clinician: Referring Provider: Treating Provider/Extender:Stone III, Lemar Livings, Esperanza Richters in Treatment: 4 Information Obtained from: Patient Chief Complaint Right buttock ulcer Electronic Signature(s) Signed: 01/01/2019 10:36:32 AM By: Worthy Keeler PA-C Entered By: Worthy Keeler on 12/31/2018 16:02:59 -------------------------------------------------------------------------------- HPI Details Patient Name: Date of Service: Penny Perez, Penny Perez 12/31/2018 3:45 PM Medical Record [id number] Patient Account Number: [id number] Date of Birth/Sex: Treating RN: 1932-06-21 (85 y.o. F) Primary Care Provider: [name] Other Clinician: Referring Provider: Treating Provider/Extender:Stone III, Lemar Livings, Esperanza Richters in Treatment: 4 History of Present Illness HPI Description: 12/03/2018 on evaluation today patient appears for initial inspection here in our clinic concerning an issue that she has been having a light having in the right gluteal region with an ulcer which initially she describes as having been a knot which when she went to see the dermatologist was biopsied and this was on 08/26/2018. Subsequently that revealed what appeared to be an inflamed ulcer with granulation tissue. Fortunately there was no signs of active infection based on what they saw on pathology. The patient states that since the biopsy she has been having a lot of pain. Fortunately there is no signs of active infection at this time no fever chills noted. She does have a history of hypertension but really no major medical problems significant otherwise although she does  have a lot of allergies including allergies to [facility] as well as antibiotics. In fact she has some ulcers in her mouth secondary to having been prescribed an antibiotic for the current ulcer. She states she has been having trouble sleeping due to the pain in the ulcer area. 12/10/2018 on evaluation today patient's wound actually showed signs of improvement as far as the loosening up of the necrotic tissue is concerned at this point. I do believe that that has done excellent at this point I think that we need to try and clear away some of the necrotic tissue in the base of the wound I discussed that with the patient as well. Fortunately she is not having any significant pain and states the Santyl did not burn although in the past she notes that it did cause her some trouble with the ulcer on her leg causing some burning. No fevers, chills, nausea, vomiting, or diarrhea. 12/17/2018 on evaluation today patient appears to be doing well with regard to her wound. This does not appear to be any more deep than it was after the last evaluation and debridement last week. With that being said there are some necrotic tissue on the surface of the wound which is can require some sharp debridement at this point. Fortunately there is no evidence of active infection at this time which is good news. No fevers, chills, nausea, vomiting, or diarrhea. 12/31/2018 on evaluation today patient appears to be doing a little better in regard to the overall appearance of her wound currently. She is tolerating the dressing changes without complication. Fortunately there is no signs of active infection at this time. With that being said she still has some necrotic tissue in the base of the wound that still needs to be removed this is very tough however and I was not able to debrided away last week. She really does not want me to perform any debridement  today either due to how bad it hurt after I did this last time. With that  being said I think we may be able to switch the dressing to something else to try to help with clearing this out a little better. Electronic Signature(s) Signed: 01/01/2019 10:36:32 AM By: Worthy Keeler PA-C Entered By: Worthy Keeler on 12/31/2018 17:16:54 -------------------------------------------------------------------------------- Physical Exam Details Patient Name: Date of Service: Penny Perez, Penny Perez 12/31/2018 3:45 PM Medical Record [id number] Patient Account Number: [id number] Date of Birth/Sex: Treating RN: 07-16-1932 (85 y.o. F) Primary Care Provider: [name] Other Clinician: Referring Provider: Treating Provider/Extender:Stone III, Lemar Livings, Esperanza Richters in Treatment: 4 Constitutional Well-nourished and well-hydrated in no acute distress. Respiratory normal breathing without difficulty. clear to auscultation bilaterally. Cardiovascular regular rate and rhythm with normal S1, S2. Psychiatric this patient is able to make decisions and demonstrates good insight into disease process. Alert and Oriented x 3. pleasant and cooperative. Notes Patient's wound bed currently showed signs of good granulation at this time in the sidewalls but again in the central part of portion of the wound she has some necrotic tissue that needs to loosen up quite a bit to come out and effectively allow this area to heal in from the bottom out. With that being said my suggestion currently is good to be that we packed this with some form of either Anasept gel moistened gauze, Dakin's moistened gauze, or potentially Vashe moistened gauze. Any of these options would be equally effective as far as helping to clear this out I do believe. Electronic Signature(s) Signed: 01/01/2019 10:36:32 AM By: Worthy Keeler PA-C Entered By: Worthy Keeler on 12/31/2018 17:17:35 -------------------------------------------------------------------------------- Physician Orders Details Patient  Name: Date of Service: Penny Perez, Penny Perez 12/31/2018 3:45 PM Medical Record [id number] Patient Account Number: [id number] Date of Birth/Sex: Treating RN: [birth year] (85 y.o. 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 Dressing Change Frequency Wound #1 Right Gluteus Change dressing three times week. Wound Cleansing Wound #1 Right Gluteus May shower and wash wound with soap and water. - with dressing changes Primary Wound Dressing Wound #1 Right Gluteus Other: - anasept or vashe moistened gauze packing Secondary Dressing Wound #1 Right Gluteus Foam Border - or large [place] skilled nursing for wound care. Lajean Manes Electronic Signature(s) Signed: 12/31/2018 6:22:02 PM By: Baruch Gouty RN, BSN Signed: 01/01/2019 10:36:32 AM By: Worthy Keeler PA-C Entered By: Baruch Gouty on 12/31/2018 17:13:35 -------------------------------------------------------------------------------- Problem List Details Patient Name: Date of Service: Penny Perez, Penny Perez 12/31/2018 3:45 PM Medical Record [id number] Patient Account Number: [id number] Date of Birth/Sex: Treating RN: [birth year] (85 y.o. 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 F) Primary Care Provider: [name] Other Clinician: Referring Provider: Treating Provider/Extender:Stone III, Lemar Livings, Esperanza Richters in Treatment: 4 Subjective Chief Complaint Information obtained from Patient Right buttock ulcer History of Present Illness (HPI) 12/03/2018 on evaluation today patient appears for initial inspection here in our clinic concerning an issue that she has been having a light having in the right gluteal region with an ulcer which initially she describes as having been a knot which when she went to see the dermatologist was biopsied and this was on 08/26/2018. Subsequently that revealed what appeared to be an inflamed ulcer with granulation tissue. Fortunately there was no signs of active infection based on what they saw on pathology. The patient states that since the biopsy she has been having a lot of pain. Fortunately there is no signs of active infection at this time no fever chills noted. She does have a history of hypertension but really no major medical problems significant otherwise although she does have a lot of allergies including allergies to [facility] as well as antibiotics. In fact she has some ulcers in her mouth secondary to having been prescribed an antibiotic for the current ulcer. She states she has been having trouble  sleeping due to the pain in the ulcer area. 12/10/2018 on evaluation today patient's wound actually showed signs of improvement as far as the loosening up of the necrotic tissue is concerned at this point. I do believe that that has done excellent at this point I think that we need to try and clear away some of the necrotic tissue in the base of the wound I discussed that with the patient as well. Fortunately she is not having any significant pain and states the Santyl did not burn although in the past she notes that it did cause her some trouble with the ulcer on her leg causing some burning. No fevers, chills, nausea, vomiting, or diarrhea. 12/17/2018 on evaluation today patient appears to be doing well with regard to her wound. This does not appear to be any more deep than it was after the last evaluation and debridement last week. With that being said there are some necrotic tissue on the surface of the wound which is can require some sharp debridement at this point. Fortunately there is no evidence of active infection at this time which is good news. No fevers, chills, nausea, vomiting, or diarrhea. 12/31/2018 on evaluation today patient appears to be doing a little better in regard to the overall appearance of her wound currently. She is tolerating the dressing changes without complication. Fortunately there is no signs of active infection at this time. With that being said she still has some necrotic tissue in the base of the wound that still needs to be removed this is very tough however and I was not able to debrided away last week. She really does not want me to perform any debridement today either due to how bad it hurt after I did this last time. With that being said I think we may be able to switch the dressing to something else to try to help with clearing this out a little better. Patient History Information obtained from Patient. Family History Cancer - Father,Mother, Lung  Disease - Mother, No family history of Diabetes, Heart Disease, Hereditary Spherocytosis, Hypertension, Kidney Disease, Seizures, Stroke, Thyroid Problems, Tuberculosis. Social History Never smoker, Marital Status - Divorced,  Alcohol Use - Never, Drug Use - No History, Caffeine Use - Never. Medical History Eyes Patient has history of Cataracts Respiratory Patient has history of Asthma Cardiovascular Patient has history of Hypertension Endocrine Denies history of Type I Diabetes, Type II Diabetes Integumentary (Skin) Denies history of History of Burn Musculoskeletal Patient has history of Osteoarthritis Medical And Surgical History Notes Respiratory Allergic Rhinitis Cardiovascular Carotid Bruit Gastrointestinal GERD Endocrine Hypothyroidism Musculoskeletal Fibromyalgia, Degenerative Disc Disease, bilateral hip replacements Review of Systems (ROS) Constitutional Symptoms (General Health) Denies complaints or symptoms of Fatigue, Fever, Chills, Marked Weight Change. Respiratory Denies complaints or symptoms of Chronic or frequent coughs, Shortness of Breath. Cardiovascular Denies complaints or symptoms of Chest pain. Psychiatric Denies complaints or symptoms of Claustrophobia, Suicidal. Objective Constitutional Well-nourished and well-hydrated in no acute distress. Vitals Time Taken: 4:36 PM, Height: 60 in, Weight: 115 lbs, BMI: 22.5, Temperature: 97.9 F, Pulse: 79 bpm, Respiratory Rate: 16 breaths/min, Blood Pressure: 140/76 mmHg. Respiratory normal breathing without difficulty. clear to auscultation bilaterally. Cardiovascular regular rate and rhythm with normal S1, S2. Psychiatric this patient is able to make decisions and demonstrates good insight into disease process. Alert and Oriented x 3. pleasant and cooperative. General Notes: Patient's wound bed currently showed signs of good granulation at this time in the sidewalls but again in the central part of  portion of the wound she has some necrotic tissue that needs to loosen up quite a bit to come out and effectively allow this area to heal in from the bottom out. With that being said my suggestion currently is good to be that we packed this with some form of either Anasept gel moistened gauze, Dakin's moistened gauze, or potentially Vashe moistened gauze. Any of these options would be equally effective as far as helping to clear this out I do believe. Integumentary (Hair, Skin) Wound #1 status is Open. Original cause of wound was Gradually Appeared. The wound is located on the Right Gluteus. The wound measures 1cm length x 1cm width x 1.3cm depth; 0.785cm^2 area and 1.021cm^3 volume. There is Fat Layer (Subcutaneous Tissue) Exposed exposed. There is no undermining noted, however, there is tunneling at 10:00 with a maximum distance of 1.5cm. There is a medium amount of serosanguineous drainage noted. The wound margin is flat and intact. There is medium (34-66%) red granulation within the wound bed. There is a medium (34-66%) amount of necrotic tissue within the wound bed including Adherent Slough. 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 Dressing Change Frequency: Wound #1 Right Gluteus: Change dressing three times week. Wound Cleansing: Wound #1 Right Gluteus: May shower and wash wound with soap and water. - with dressing changes Primary Wound Dressing: Wound #1 Right Gluteus: Other: - anasept or vashe moistened gauze packing Secondary Dressing: Wound #1 Right Gluteus: Foam Border - or large bandaid Home Health: [place] skilled nursing for wound care. - [facility] 1. Patient's wound bed currently is showing signs of good granulation again on the sidewalls I am  pleased with that we will switch to either the Anasept, Dakin's, or Vashe moistened gauze in order to help clean this out more effectively and hopefully get this to heal quicker. 2. I am also going to suggest that we go ahead and continue with a border foam dressing. This will be changed 3 times a week. 3. I do recommend  the patient still avoid any excessive pressure to the wound bed at this point. We will see patient back for reevaluation in 2 weeks here in the clinic. If anything worsens or changes patient will contact our office for additional recommendations. Electronic Signature(s) Signed: 01/01/2019 10:36:32 AM By: Worthy Keeler PA-C Entered By: Worthy Keeler on 12/31/2018 17:18:23 -------------------------------------------------------------------------------- HxROS Details Patient Name: Date of Service: Penny Perez, Penny Perez 12/31/2018 3:45 PM Medical Record [id number] Patient Account Number: [id number] Date of Birth/Sex: Treating RN: 08/29/1932 (85 y.o. F) Primary Care Provider: [name] Other Clinician: Referring Provider: Treating Provider/Extender:Stone III, Lemar Livings, Esperanza Richters in Treatment: 4 Information Obtained From Patient Constitutional Symptoms (General Health) Complaints and Symptoms: Negative for: Fatigue; Fever; Chills; Marked Weight Change Respiratory Complaints and Symptoms: Negative for: Chronic or frequent coughs; Shortness of Breath Medical History: Positive for: Asthma Past Medical History Notes: Allergic Rhinitis Cardiovascular Complaints and Symptoms: Negative for: Chest pain Medical History: Positive for: Hypertension Past Medical History Notes: Carotid Bruit Psychiatric Complaints and Symptoms: Negative for: Claustrophobia; Suicidal Eyes Medical History: Positive for: Cataracts Gastrointestinal Medical History: Past Medical History Notes: GERD Endocrine Medical History: Negative for: Type I Diabetes; Type II  Diabetes Past Medical History Notes: Hypothyroidism Integumentary (Skin) Medical History: Negative for: History of Burn Musculoskeletal Medical History: Positive for: Osteoarthritis Past Medical History Notes: Fibromyalgia, Degenerative Disc Disease, bilateral hip replacements HBO Extended History Items Eyes: Cataracts Immunizations Pneumococcal Vaccine: Received Pneumococcal Vaccination: Yes Implantable Devices None Family and Social History Cancer: Yes - Father,Mother; Diabetes: No; Heart Disease: No; Hereditary Spherocytosis: No; Hypertension: No; Kidney Disease: No; Lung Disease: Yes - Mother; Seizures: No; Stroke: No; Thyroid Problems: No; Tuberculosis: No; Never smoker; Marital Status - Divorced; Alcohol Use: Never; Drug Use: No History; Caffeine Use: Never; Financial Concerns: No; Food, Clothing or Shelter Needs: No; Support System Lacking: No; Transportation Concerns: No Physician Affirmation I have reviewed and agree with the above information. Electronic Signature(s) Signed: 01/01/2019 10:36:32 AM By: Worthy Keeler PA-C Entered By: Worthy Keeler on 12/31/2018 17:17:11 -------------------------------------------------------------------------------- SuperBill Details Patient Name: Date of Service: Penny Perez, Penny Perez 12/31/2018 Medical Record [id number] Patient Account Number: [id number] Date of Birth/Sex: Treating RN: [birth year] (85 y.o. 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 I10 Essential (primary) hypertension Facility Procedures CPT4 Code: [id number] Description: 99213 - WOUND CARE VISIT-LEV 3 EST PT Modifier: Quantity:  1 Physician Procedures CPT4 Code Description: [id number] - WC PHYS LEVEL 4 - EST PT ICD-10 Diagnosis Description [id number] Unspecified open wound of right buttock, initial enc L98.418 Non-pressure chronic ulcer of buttock with other spe I10 Essential (primary) hypertension Modifier: ounter cified severity Quantity: 1 Electronic Signature(s) Signed: 01/01/2019 10:36:32 AM By: Worthy Keeler PA-C Entered By: Worthy Keeler on 12/31/2018 17:18:43

## 2019-01-06 NOTE — Progress Notes (Signed)
Penny Perez, Penny Perez ([id number]) Visit Report for 12/31/2018 Arrival Information Details Patient Name: Date of Service: Penny Perez, Penny Perez 12/31/2018 3:45 PM Medical Record [id number] Patient Account Number: [id number] Date of Birth/Sex: Treating RN: 1932/08/04 (85 y.o. [name] Primary Care Deonte Otting: [name] Other Clinician: Referring Sway Guttierrez: Treating Nicola Heinemann/Extender:Stone III, Lemar Livings, Esperanza Richters in Treatment: 4 Visit Information History Since Last Visit Added or deleted any medications: No Patient Arrived: Ambulatory Any new allergies or adverse reactions: No Arrival Time: 16:34 Had a fall or experienced change in No Accompanied By: alone activities of daily living that may affect Transfer Assistance: None risk of falls: Patient Identification Verified: Yes Signs or symptoms of abuse/neglect since last No Secondary Verification Process Yes visito Completed: Hospitalized since last visit: No Patient Requires Transmission-Based No Implantable device outside of the clinic excluding No Precautions: cellular tissue based products placed in the center Patient Has Alerts: No since last visit: Has Dressing in Place as Prescribed: Yes Pain Present Now: No Electronic Signature(s) Signed: 01/05/2019 5:51:44 PM By: Levan Hurst RN, BSN Entered By: Levan Hurst on 12/31/2018 16:34:55 -------------------------------------------------------------------------------- Clinic Level of Care Assessment Details Patient Name: Date of Service: Penny Perez, Penny Perez 12/31/2018 3:45 PM Medical Record [id number] Patient Account Number: [id number] Date of Birth/Sex: Treating RN: 05-15-1932 (85 y.o. [name] Primary Care Ashiah Karpowicz: [name] Other Clinician: Referring Euna Armon: Treating Lakishia Bourassa/Extender:Stone III, Lemar Livings, Esperanza Richters in Treatment: 4 Clinic Level of Care Assessment Items TOOL 4 Quantity Score []  - Use when only an  EandM is performed on FOLLOW-UP visit 0 ASSESSMENTS - Nursing Assessment / Reassessment X - Reassessment of Co-morbidities (includes updates in patient status) 1 10 X - Reassessment of Adherence to Treatment Plan 1 5 ASSESSMENTS - Wound and Skin Assessment / Reassessment X - Simple Wound Assessment / Reassessment - one wound 1 5 []  - Complex Wound Assessment / Reassessment - multiple wounds 0 []  - Dermatologic / Skin Assessment (not related to wound area) 0 ASSESSMENTS - Focused Assessment []  - Circumferential Edema Measurements - multi extremities 0 []  - Nutritional Assessment / Counseling / Intervention 0 []  - Lower Extremity Assessment (monofilament, tuning fork, pulses) 0 []  - Peripheral Arterial Disease Assessment (using hand held doppler) 0 ASSESSMENTS - Ostomy and/or Continence Assessment and Care []  - Incontinence Assessment and Management 0 []  - Ostomy Care Assessment and Management (repouching, etc.) 0 PROCESS - Coordination of Care X - Simple Patient / Family Education for ongoing care 1 15 []  - Complex (extensive) Patient / Family Education for ongoing care 0 X - Staff obtains Programmer, systems, Records, Test Results / Process Orders 1 10 X - Staff telephones HHA, Nursing Homes / Clarify orders / etc 1 10 []  - Routine Transfer to another Facility (non-emergent condition) 0 []  - Routine Hospital Admission (non-emergent condition) 0 []  - New Admissions / Biomedical engineer / Ordering NPWT, Apligraf, etc. 0 []  - Emergency Hospital Admission (emergent condition) 0 X - Simple Discharge Coordination 1 10 []  - Complex (extensive) Discharge Coordination 0 PROCESS - Special Needs []  - Pediatric / Minor Patient Management 0 []  - Isolation Patient Management 0 []  - Hearing / Language / Visual special needs 0 []  - Assessment of Community assistance (transportation, D/C planning, etc.) 0 []  - Additional assistance / Altered mentation 0 []  - Support Surface(s) Assessment (bed, cushion,  seat, etc.) 0 INTERVENTIONS - Wound Cleansing / Measurement X - Simple Wound Cleansing - one wound 1 5 []  - Complex Wound Cleansing - multiple wounds 0 X - Wound Imaging (photographs -  any number of wounds) 1 5 []  - Wound Tracing (instead of photographs) 0 X - Simple Wound Measurement - one wound 1 5 []  - Complex Wound Measurement - multiple wounds 0 INTERVENTIONS - Wound Dressings X - Small Wound Dressing one or multiple wounds 1 10 []  - Medium Wound Dressing one or multiple wounds 0 []  - Large Wound Dressing one or multiple wounds 0 X - Application of Medications - topical 1 5 []  - Application of Medications - injection 0 INTERVENTIONS - Miscellaneous []  - External ear exam 0 []  - Specimen Collection (cultures, biopsies, blood, body fluids, etc.) 0 []  - Specimen(s) / Culture(s) sent or taken to Lab for analysis 0 []  - Patient Transfer (multiple staff / Civil Service fast streamer / Similar devices) 0 []  - Simple Staple / Suture removal (25 or less) 0 []  - Complex Staple / Suture removal (26 or more) 0 []  - Hypo / Hyperglycemic Management (close monitor of Blood Glucose) 0 []  - Ankle / Brachial Index (ABI) - do not check if billed separately 0 X - Vital Signs 1 5 Has the patient been seen at the hospital within the last three years: Yes Total Score: 100 Level Of Care: New/Established - Level 3 Electronic Signature(s) Signed: 12/31/2018 6:22:02 PM By: Baruch Gouty RN, BSN Entered By: Baruch Gouty on 12/31/2018 17:14:02 -------------------------------------------------------------------------------- Encounter Discharge Information Details Patient Name: Date of Service: Penny Perez, Penny Perez 12/31/2018 3:45 PM Medical Record [id number] Patient Account Number: [id number] Date of Birth/Sex: Treating RN: [birth year] (85 y.o. [name] Primary Care Daelin Haste: [name] Other Clinician: Referring Aylinn Rydberg: Treating Derry Arbogast/Extender:Stone III, Lemar Livings, Esperanza Richters in  Treatment: 4 Encounter Discharge Information Items Discharge Condition: Stable Ambulatory Status: Ambulatory Discharge Destination: Home Transportation: Private Auto Accompanied By: self Schedule Follow-up Appointment: Yes Clinical Summary of Care: Patient Declined Electronic Signature(s) Signed: 12/31/2018 6:22:02 PM By: Baruch Gouty RN, BSN Entered By: Baruch Gouty on 12/31/2018 17:59:13 -------------------------------------------------------------------------------- [place] Details Patient Name: Date of Service: Penny Perez, Penny Perez 12/31/2018 3:45 PM Medical Record [id number] Patient Account Number: [id number] Date of Birth/Sex: Treating RN: 04-23-1932 (85 y.o. [name] Primary Care Caleigha Zale: [name] Other Clinician: Referring Ijeoma Loor: Treating Rudolfo Brandow/Extender:Stone III, Lemar Livings, Esperanza Richters in Treatment: 4 Active Inactive Malignancy/Atypical Etiology Nursing Diagnoses: Knowledge deficit related to disease process and management of atypical ulcer etiology Goals: Patient/caregiver will verbalize understanding of disease process and disease management of atypical ulcer etiology Date Initiated: 12/03/2018 Target Resolution Date: 01/28/2019 Goal Status: Active Interventions: Assess patient and family medical history for signs and symptoms of malignancy/atypical etiology upon admission Provide education on atypical ulcer etiologies Notes: Wound/Skin Impairment Nursing Diagnoses: Impaired tissue integrity Knowledge deficit related to ulceration/compromised skin integrity Goals: Patient/caregiver will verbalize understanding of skin care regimen Date Initiated: 12/03/2018 Target Resolution Date: 01/28/2019 Goal Status: Active Ulcer/skin breakdown will have a volume reduction of 30% by week 4 Date Initiated: 12/03/2018 Date Inactivated: 12/31/2018 Target Resolution Date: 12/31/2018 Unmet Reason: tunnel Goal Status:  Unmet unroofed Ulcer/skin breakdown will have a volume reduction of 50% by week 8 Date Initiated: 12/31/2018 Target Resolution Date: 01/28/2019 Goal Status: Active Interventions: Assess patient/caregiver ability to obtain necessary supplies Assess patient/caregiver ability to perform ulcer/skin care regimen upon admission and as needed Assess ulceration(s) every visit Treatment Activities: Skin care regimen initiated : 12/03/2018 Topical wound management initiated : 12/03/2018 Notes: Electronic Signature(s) Signed: 12/31/2018 6:22:02 PM By: Baruch Gouty RN, BSN Entered By: Baruch Gouty on 12/31/2018 17:08:34 -------------------------------------------------------------------------------- Pain Assessment Details Patient Name: Date of Service: [name]  12/31/2018 3:45 PM Medical Record [id number] Patient Account Number: [id number] Date of Birth/Sex: Treating RN: [birth year] (85 y.o. [name] Primary Care Deasha Clendenin: [name] Other Clinician: Referring Delayla Hoffmaster: Treating Aasiyah Auerbach/Extender:Stone III, Lemar Livings, Esperanza Richters in Treatment: 4 Active Problems Location of Pain Severity and Description of Pain Patient Has Paino No Site Locations Pain Management and Medication Current Pain Management: Electronic Signature(s) Signed: 01/05/2019 5:51:44 PM By: Levan Hurst RN, BSN Entered By: Levan Hurst on 12/31/2018 16:36:29 -------------------------------------------------------------------------------- Patient/Caregiver Education Details Patient Name: Date of Service: [name] 12/2/2020andnbsp3:45 PM Medical Record Patient Account Number: [id number] Number: Treating RN: Baruch Gouty Date of Birth/Gender: 12-21-1932 (85 y.o. F) Other Clinician: Primary Care Physician: Earmon Phoenix Referring Physician: Physician/Extender: Lorelle Gibbs in Treatment: 4 Education  Assessment Education Provided To: Patient Education Topics Provided Wound/Skin Impairment: Methods: Explain/Verbal Responses: Reinforcements needed, State content correctly Electronic Signature(s) Signed: 12/31/2018 6:22:02 PM By: Baruch Gouty RN, BSN Entered By: Baruch Gouty on 12/31/2018 17:08:49 -------------------------------------------------------------------------------- Wound Assessment Details Patient Name: Date of Service: Penny Perez, Penny Perez 12/31/2018 3:45 PM Medical Record [id number] Patient Account Number: [id number] Date of Birth/Sex: Treating RN: 09/14/1932 (85 y.o. [name] Primary Care Harleigh Civello: [name] Other Clinician: Referring Angle Dirusso: Treating Tayvia Faughnan/Extender:Stone III, Lemar Livings, Esperanza Richters in Treatment: 4 Wound Status Wound Number: 1 Primary Atypical Etiology: Wound Location: Right Gluteus Wound Open Wounding Event: Gradually Appeared Status: Date Acquired: 07/30/2018 Comorbid Cataracts, Asthma, Hypertension, Weeks Of Treatment: 4 History: Osteoarthritis Clustered Wound: No Photos Wound Measurements Length: (cm) 1 Width: (cm) 1 Depth: (cm) 1.3 Area: (cm) 0.785 Volume: (cm) 1.021 % Reduction in Area: 26.5% % Reduction in Volume: -19.4% Epithelialization: Small (1-33%) Tunneling: Yes Position (o'clock): 10 Maximum Distance: (cm) 1.5 Undermining: No Wound Description Classification: Full Thickness Without Exposed Support Foul Od Structures Slough/ Wound Flat and Intact Margin: Exudate Medium Amount: Exudate Serosanguineous Type: Exudate red, brown Color: Wound Bed Granulation Amount: Medium (34-66%) Granulation Quality: Red Fascia E Necrotic Amount: Medium (34-66%) Fat Laye Necrotic Quality: Adherent Slough Tendon E Muscle Exp Joint Expo Bone Expos or After Cleansing: No Fibrino Yes Exposed Structure xposed: No r (Subcutaneous Tissue) Exposed: Yes xposed: No osed: No sed: No ed:  No Treatment Notes Wound #1 (Right Gluteus) 3. Primary Dressing Applied Other primary dressing (specifiy in notes) 4. Secondary Dressing Foam Border Dressing Notes anasept moistened gauze packing Electronic Signature(s) Signed: 01/05/2019 5:51:44 PM By: Levan Hurst RN, BSN Signed: 01/06/2019 4:34:42 PM By: Mikeal Hawthorne EMT/HBOT Entered By: Mikeal Hawthorne on 01/05/2019 13:36:41 -------------------------------------------------------------------------------- [place] Details Patient Name: Date of Service: Penny Perez, Penny Perez 12/31/2018 3:45 PM Medical Record [id number] Patient Account Number: [id number] Date of Birth/Sex: Treating RN: 08/21/1932 (85 y.o. [name] Primary Care Kaizen Ibsen: [name] Other Clinician: Referring Vicente Weidler: Treating Fotini Lemus/Extender:Stone III, Lemar Livings, Esperanza Richters in Treatment: 4 Vital Signs Time Taken: 16:36 Temperature (F): 97.9 Height (in): 60 Pulse (bpm): 79 Weight (lbs): 115 Respiratory Rate (breaths/min): 16 Body Mass Index (BMI): 22.5 Blood Pressure (mmHg): 140/76 Reference Range: 80 - 120 mg / dl Electronic Signature(s) Signed: 01/05/2019 5:51:44 PM By: Levan Hurst RN, BSN Entered By: Levan Hurst on 12/31/2018 16:36:24

## 2019-01-14 ENCOUNTER — Encounter (HOSPITAL_BASED_OUTPATIENT_CLINIC_OR_DEPARTMENT_OTHER): Payer: Medicare Other | Admitting: Physician Assistant

## 2019-01-14 ENCOUNTER — Other Ambulatory Visit: Payer: Self-pay

## 2019-01-14 DIAGNOSIS — L98418 Non-pressure chronic ulcer of buttock with other specified severity: Secondary | ICD-10-CM | POA: Diagnosis not present

## 2019-01-14 NOTE — Progress Notes (Addendum)
BRYNNLY, BROCHU ([id number]) Visit Report for 01/14/2019 Chief Complaint Document Details Patient Name: Date of Service: Penny Perez, Penny Perez 01/14/2019 3:45 PM Medical Record [id number] Patient Account Number: [id number] Date of Birth/Sex: Treating RN: 1932-07-08 (85 y.o. F) Primary Care Provider: [name] Other Clinician: Referring Provider: Treating Provider/Extender:Stone III, Lemar Livings, Esperanza Richters in Treatment: 6 Information Obtained from: Patient Chief Complaint Right buttock ulcer Electronic Signature(s) Signed: 01/14/2019 3:58:27 PM By: Worthy Keeler PA-C Entered By: Worthy Keeler on 01/14/2019 15:58:27 -------------------------------------------------------------------------------- HPI Details Patient Name: Date of Service: Penny Perez, Penny Perez 01/14/2019 3:45 PM Medical Record [id number] Patient Account Number: [id number] Date of Birth/Sex: Treating RN: [birth year] (85 y.o. F) Primary Care Provider: [name] Other Clinician: Referring Provider: Treating Provider/Extender:Stone III, Lemar Livings, Esperanza Richters in Treatment: 6 History of Present Illness HPI Description: 12/03/2018 on evaluation today patient appears for initial inspection here in our clinic concerning an issue that she has been having a light having in the right gluteal region with an ulcer which initially she describes as having been a knot which when she went to see the dermatologist was biopsied and this was on 08/26/2018. Subsequently that revealed what appeared to be an inflamed ulcer with granulation tissue. Fortunately there was no signs of active infection based on what they saw on pathology. The patient states that since the biopsy she has been having a lot of pain. Fortunately there is no signs of active infection at this time no fever chills noted. She does have a history of hypertension but really no major medical problems significant otherwise although she does  have a lot of allergies including allergies to [facility] as well as antibiotics. In fact she has some ulcers in her mouth secondary to having been prescribed an antibiotic for the current ulcer. She states she has been having trouble sleeping due to the pain in the ulcer area. 12/10/2018 on evaluation today patient's wound actually showed signs of improvement as far as the loosening up of the necrotic tissue is concerned at this point. I do believe that that has done excellent at this point I think that we need to try and clear away some of the necrotic tissue in the base of the wound I discussed that with the patient as well. Fortunately she is not having any significant pain and states the Santyl did not burn although in the past she notes that it did cause her some trouble with the ulcer on her leg causing some burning. No fevers, chills, nausea, vomiting, or diarrhea. 12/17/2018 on evaluation today patient appears to be doing well with regard to her wound. This does not appear to be any more deep than it was after the last evaluation and debridement last week. With that being said there are some necrotic tissue on the surface of the wound which is can require some sharp debridement at this point. Fortunately there is no evidence of active infection at this time which is good news. No fevers, chills, nausea, vomiting, or diarrhea. 12/31/2018 on evaluation today patient appears to be doing a little better in regard to the overall appearance of her wound currently. She is tolerating the dressing changes without complication. Fortunately there is no signs of active infection at this time. With that being said she still has some necrotic tissue in the base of the wound that still needs to be removed this is very tough however and I was not able to debrided away last week. She really does not want me to perform any debridement  today either due to how bad it hurt after I did this last time. With that  being said I think we may be able to switch the dressing to something else to try to help with clearing this out a little better. 01/14/2019 upon evaluation today patient actually appears to be doing better with regard to her wound. She is showing signs of new granulation tissue and I do feel like that this is starting to improve. That is good news. Fortunately there is no evidence of active infection at this point. No fevers, chills, nausea, vomiting, or diarrhea. Electronic Signature(s) Signed: 01/14/2019 5:22:14 PM By: Worthy Keeler PA-C Entered By: Worthy Keeler on 01/14/2019 17:22:14 -------------------------------------------------------------------------------- Physical Exam Details Patient Name: Date of Service: Penny Perez, Penny Perez 01/14/2019 3:45 PM Medical Record [id number] Patient Account Number: [id number] Date of Birth/Sex: Treating RN: 02-25-1932 (85 y.o. F) Primary Care Provider: [name] Other Clinician: Referring Provider: Treating Provider/Extender:Stone III, Lemar Livings, Esperanza Richters in Treatment: 6 Constitutional Well-nourished and well-hydrated in no acute distress. Respiratory normal breathing without difficulty. clear to auscultation bilaterally. Cardiovascular regular rate and rhythm with normal S1, S2. Psychiatric this patient is able to make decisions and demonstrates good insight into disease process. Alert and Oriented x 3. pleasant and cooperative. Notes Patient's wound bed currently showed signs of good granulation there was still some connective tissue noted in the base of the wound that has not covered her with granulation as of yet although that is definitely what we are working towards. I do feel like she is making some progress nonetheless in this regard. Electronic Signature(s) Signed: 01/14/2019 5:22:46 PM By: Worthy Keeler PA-C Entered By: Worthy Keeler on 01/14/2019  17:22:45 -------------------------------------------------------------------------------- Physician Orders Details Patient Name: Date of Service: Penny Perez, Penny Perez 01/14/2019 3:45 PM Medical Record [id number] Patient Account Number: [id number] Date of Birth/Sex: Treating RN: [birth year] (85 y.o. 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 Dressing Change Frequency Wound #1 Right Gluteus Change dressing three times week. Wound Cleansing Wound #1 Right Gluteus May shower and wash wound with soap and water. - with dressing changes Primary Wound Dressing Wound #1 Right Gluteus Other: - anasept moistened gauze in clinic. Use Dakin's solution at home. Secondary Dressing Wound #1 Right Gluteus Foam Border - or large [place] skilled nursing for wound care. Lajean Manes Electronic Signature(s) Signed: 01/14/2019 5:37:18 PM By: Worthy Keeler PA-C Signed: 01/15/2019 5:32:06 PM By: Levan Hurst RN, BSN Entered By: Levan Hurst on 01/14/2019 16:06:51 -------------------------------------------------------------------------------- Problem List Details Patient Name: Date of Service: Penny Perez, Penny Perez 01/14/2019 3:45 PM Medical Record [id number] Patient Account Number: [id number] Date of Birth/Sex: Treating RN: Jan 29, 1933 (85 y.o. 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 F) Primary Care Provider: [name] Other Clinician: Referring Provider: Treating Provider/Extender:Stone III, Lemar Livings, Esperanza Richters in Treatment: 6 Subjective Chief Complaint Information obtained from Patient Right buttock ulcer History of Present Illness (HPI) 12/03/2018 on evaluation today patient appears for initial inspection here in our clinic concerning an issue that she has been having a light having in the right gluteal region with an ulcer which initially she describes as having been a knot which when she went to see the dermatologist was biopsied and this was on 08/26/2018. Subsequently that revealed what appeared to be an inflamed ulcer with granulation tissue. Fortunately there was no signs of active infection based on what they saw on pathology. The patient states that since the biopsy she has been having a lot of pain. Fortunately there is no signs of active infection at this time no fever chills noted. She does have a history of hypertension but really no major medical problems significant otherwise although she does have a lot of allergies including allergies to [facility] as well as antibiotics.  In fact she has some ulcers in her mouth secondary to having been prescribed an antibiotic for the current ulcer. She states she has been having trouble sleeping due to the pain in the ulcer area. 12/10/2018 on evaluation today patient's wound actually showed signs of improvement as far as the loosening up of the necrotic tissue is concerned at this point. I do believe that that has done excellent at this point I think that we need to try and clear away some of the necrotic tissue in the base of the wound I discussed that with the patient as well. Fortunately she is not having any significant pain and states the Santyl did not burn although in the past she notes that it did cause her some trouble with the ulcer on her leg causing some burning. No fevers, chills, nausea, vomiting, or diarrhea. 12/17/2018 on evaluation today patient appears to be doing well with regard to her wound. This does not appear to be any more deep than it was after the last evaluation and debridement last week. With that being said there are some necrotic tissue on the surface of the wound which is can require some sharp debridement at this point. Fortunately there is no evidence of active infection at this time which is good news. No fevers, chills, nausea, vomiting, or diarrhea. 12/31/2018 on evaluation today patient appears to be doing a little better in regard to the overall appearance of her wound currently. She is tolerating the dressing changes without complication. Fortunately there is no signs of active infection at this time. With that being said she still has some necrotic tissue in the base of the wound that still needs to be removed this is very tough however and I was not able to debrided away last week. She really does not want me to perform any debridement today either due to how bad it hurt after I did this last time. With that being said I think we may be able to switch the dressing to something else to try  to help with clearing this out a little better. 01/14/2019 upon evaluation today patient actually appears to be doing better with regard to her wound. She is showing signs of new granulation tissue and I do feel like that  this is starting to improve. That is good news. Fortunately there is no evidence of active infection at this point. No fevers, chills, nausea, vomiting, or diarrhea. Patient History Information obtained from Patient. Family History Cancer - Father,Mother, Lung Disease - Mother, No family history of Diabetes, Heart Disease, Hereditary Spherocytosis, Hypertension, Kidney Disease, Seizures, Stroke, Thyroid Problems, Tuberculosis. Social History Never smoker, Marital Status - Divorced, Alcohol Use - Never, Drug Use - No History, Caffeine Use - Never. Medical History Eyes Patient has history of Cataracts Respiratory Patient has history of Asthma Cardiovascular Patient has history of Hypertension Endocrine Denies history of Type I Diabetes, Type II Diabetes Integumentary (Skin) Denies history of History of Burn Musculoskeletal Patient has history of Osteoarthritis Medical And Surgical History Notes Respiratory Allergic Rhinitis Cardiovascular Carotid Bruit Gastrointestinal GERD Endocrine Hypothyroidism Musculoskeletal Fibromyalgia, Degenerative Disc Disease, bilateral hip replacements Review of Systems (ROS) Constitutional Symptoms (General Health) Denies complaints or symptoms of Fatigue, Fever, Chills, Marked Weight Change. Respiratory Denies complaints or symptoms of Chronic or frequent coughs, Shortness of Breath. Cardiovascular Denies complaints or symptoms of Chest pain. Psychiatric Denies complaints or symptoms of Claustrophobia, Suicidal. Objective Constitutional Well-nourished and well-hydrated in no acute distress. Vitals Time Taken: 3:54 PM, Height: 60 in, Weight: 115 lbs, BMI: 22.5, Temperature: 98.6 F, Pulse: 75 bpm, Respiratory Rate: 18  breaths/min, Blood Pressure: 176/51 mmHg. Respiratory normal breathing without difficulty. clear to auscultation bilaterally. Cardiovascular regular rate and rhythm with normal S1, S2. Psychiatric this patient is able to make decisions and demonstrates good insight into disease process. Alert and Oriented x 3. pleasant and cooperative. General Notes: Patient's wound bed currently showed signs of good granulation there was still some connective tissue noted in the base of the wound that has not covered her with granulation as of yet although that is definitely what we are working towards. I do feel like she is making some progress nonetheless in this regard. Integumentary (Hair, Skin) Wound #1 status is Open. Original cause of wound was Gradually Appeared. The wound is located on the Right Gluteus. The wound measures 0.7cm length x 0.7cm width x 1.2cm depth; 0.385cm^2 area and 0.462cm^3 volume. There is Fat Layer (Subcutaneous Tissue) Exposed exposed. There is no tunneling or undermining noted. There is a medium amount of serosanguineous drainage noted. The wound margin is flat and intact. There is medium (34-66%) red granulation within the wound bed. There is a medium (34-66%) amount of necrotic tissue within the wound bed including Adherent Slough. 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 Dressing Change Frequency: Wound #1 Right Gluteus: Change dressing three times week. Wound Cleansing: Wound #1 Right Gluteus: May shower and wash wound with soap and water. - with dressing changes Primary Wound Dressing: Wound #1 Right Gluteus: Other: - anasept moistened gauze in clinic. Use Dakin's solution at home. Secondary Dressing: Wound #1 Right Gluteus: Foam Border - or large  bandaid Home Health: [place] skilled nursing for wound care. - [facility] 1. I would recommend currently that we continue with the Dakin's moistened gauze packing as I feel like that still the best thing for the patient currently she is in agreement with that plan. 2. I recommend currently based on what we are seeing as well she continue with appropriate offloading. I believe she is doing a good job in this regard hopefully that will continue to be  the case. We will see patient back for reevaluation in 1 week here in the clinic. If anything worsens or changes patient will contact our office for additional recommendations. Electronic Signature(s) Signed: 01/14/2019 5:23:11 PM By: Worthy Keeler PA-C Entered By: Worthy Keeler on 01/14/2019 17:23:11 -------------------------------------------------------------------------------- HxROS Details Patient Name: Date of Service: Penny Perez, Penny Perez 01/14/2019 3:45 PM Medical Record [id number] Patient Account Number: [id number] Date of Birth/Sex: Treating RN: 11-Mar-1932 (85 y.o. F) Primary Care Provider: [name] Other Clinician: Referring Provider: Treating Provider/Extender:Stone III, Lemar Livings, Esperanza Richters in Treatment: 6 Information Obtained From Patient Constitutional Symptoms (General Health) Complaints and Symptoms: Negative for: Fatigue; Fever; Chills; Marked Weight Change Respiratory Complaints and Symptoms: Negative for: Chronic or frequent coughs; Shortness of Breath Medical History: Positive for: Asthma Past Medical History Notes: Allergic Rhinitis Cardiovascular Complaints and Symptoms: Negative for: Chest pain Medical History: Positive for: Hypertension Past Medical History Notes: Carotid Bruit Psychiatric Complaints and Symptoms: Negative for: Claustrophobia; Suicidal Eyes Medical History: Positive for: Cataracts Gastrointestinal Medical History: Past Medical History  Notes: GERD Endocrine Medical History: Negative for: Type I Diabetes; Type II Diabetes Past Medical History Notes: Hypothyroidism Integumentary (Skin) Medical History: Negative for: History of Burn Musculoskeletal Medical History: Positive for: Osteoarthritis Past Medical History Notes: Fibromyalgia, Degenerative Disc Disease, bilateral hip replacements HBO Extended History Items Eyes: Cataracts Immunizations Pneumococcal Vaccine: Received Pneumococcal Vaccination: Yes Implantable Devices None Family and Social History Cancer: Yes - Father,Mother; Diabetes: No; Heart Disease: No; Hereditary Spherocytosis: No; Hypertension: No; Kidney Disease: No; Lung Disease: Yes - Mother; Seizures: No; Stroke: No; Thyroid Problems: No; Tuberculosis: No; Never smoker; Marital Status - Divorced; Alcohol Use: Never; Drug Use: No History; Caffeine Use: Never; Financial Concerns: No; Food, Clothing or Shelter Needs: No; Support System Lacking: No; Transportation Concerns: No Physician Affirmation I have reviewed and agree with the above information. Electronic Signature(s) Signed: 01/14/2019 5:37:18 PM By: Worthy Keeler PA-C Entered By: Worthy Keeler on 01/14/2019 17:22:31 -------------------------------------------------------------------------------- SuperBill Details Patient Name: Date of Service: Penny Perez, Penny Perez 01/14/2019 Medical Record [id number] Patient Account Number: [id number] Date of Birth/Sex: Treating RN: 10/22/1932 (85 y.o. 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 I10 Essential (primary) hypertension Facility Procedures CPT4 Code:  [id number] Description: [id number] - WOUND CARE VISIT-LEV 3 EST PT Modifier: Quantity: 1 Physician Procedures CPT4 Code Description: [id number] [place] - WC PHYS LEVEL 4 - EST PT ICD-10 Diagnosis Description [id number] Unspecified open wound of right buttock, initial enc L98.418 Non-pressure chronic ulcer of buttock with other spe I10 Essential (primary) hypertension Modifier: ounter cified severity Quantity: 1 Electronic Signature(s) Signed: 01/14/2019 5:23:29 PM By: Worthy Keeler PA-C Entered By: Worthy Keeler on 01/14/2019 17:23:28

## 2019-01-15 NOTE — Progress Notes (Signed)
MAGEAN, UFFELMAN ([id number]) Visit Report for 01/14/2019 Arrival Information Details Patient Name: Date of Service: Penny Perez, Penny Perez 01/14/2019 3:45 PM Medical Record [id number] Patient Account Number: [id number] Date of Birth/Sex: Treating RN: 1932-09-27 (85 y.o. [name] Primary Care Virgil Slinger: [name] Other Clinician: Referring Lewie Deman: Treating Cristino Degroff/Extender:Stone III, Lemar Livings, Esperanza Richters in Treatment: 6 Visit Information History Since Last Visit All ordered tests and consults were completed: No Patient Arrived: Ambulatory Added or deleted any medications: No Arrival Time: 15:54 Any new allergies or adverse reactions: No Accompanied By: self Had a fall or experienced change in No Transfer Assistance: None activities of daily living that may affect Patient Identification Verified: Yes risk of falls: Secondary Verification Process Yes Signs or symptoms of abuse/neglect since last No Completed: visito Patient Requires Transmission-Based No Hospitalized since last visit: No Precautions: Implantable device outside of the clinic excluding No Patient Has Alerts: No cellular tissue based products placed in the center since last visit: Has Dressing in Place as Prescribed: Yes Pain Present Now: Yes Electronic Signature(s) Signed: 01/15/2019 9:23:41 AM By: Carlene Coria RN Entered By: Carlene Coria on 01/14/2019 15:54:30 -------------------------------------------------------------------------------- Clinic Level of Care Assessment Details Patient Name: Date of Service: Penny Perez, Penny Perez 01/14/2019 3:45 PM Medical Record [id number] Patient Account Number: [id number] Date of Birth/Sex: Treating RN: 1932/10/27 (85 y.o. [name] Primary Care Kesley Gaffey: [name] Other Clinician: Referring Sieanna Vanstone: Treating Korra Christine/Extender:Stone III, Lemar Livings, Esperanza Richters in Treatment: 6 Clinic Level of Care Assessment  Items TOOL 4 Quantity Score X - Use when only an EandM is performed on FOLLOW-UP visit 1 0 ASSESSMENTS - Nursing Assessment / Reassessment X - Reassessment of Co-morbidities (includes updates in patient status) 1 10 X - Reassessment of Adherence to Treatment Plan 1 5 ASSESSMENTS - Wound and Skin Assessment / Reassessment X - Simple Wound Assessment / Reassessment - one wound 1 5 []  - Complex Wound Assessment / Reassessment - multiple wounds 0 []  - Dermatologic / Skin Assessment (not related to wound area) 0 ASSESSMENTS - Focused Assessment []  - Circumferential Edema Measurements - multi extremities 0 []  - Nutritional Assessment / Counseling / Intervention 0 []  - Lower Extremity Assessment (monofilament, tuning fork, pulses) 0 []  - Peripheral Arterial Disease Assessment (using hand held doppler) 0 ASSESSMENTS - Ostomy and/or Continence Assessment and Care []  - Incontinence Assessment and Management 0 []  - Ostomy Care Assessment and Management (repouching, etc.) 0 PROCESS - Coordination of Care X - Simple Patient / Family Education for ongoing care 1 15 []  - Complex (extensive) Patient / Family Education for ongoing care 0 X - Staff obtains Programmer, systems, Records, Test Results / Process Orders 1 10 X - Staff telephones HHA, Nursing Homes / Clarify orders / etc 1 10 []  - Routine Transfer to another Facility (non-emergent condition) 0 []  - Routine Hospital Admission (non-emergent condition) 0 []  - New Admissions / Biomedical engineer / Ordering NPWT, Apligraf, etc. 0 []  - Emergency Hospital Admission (emergent condition) 0 X - Simple Discharge Coordination 1 10 []  - Complex (extensive) Discharge Coordination 0 PROCESS - Special Needs []  - Pediatric / Minor Patient Management 0 []  - Isolation Patient Management 0 []  - Hearing / Language / Visual special needs 0 []  - Assessment of Community assistance (transportation, D/C planning, etc.) 0 []  - Additional assistance / Altered mentation  0 []  - Support Surface(s) Assessment (bed, cushion, seat, etc.) 0 INTERVENTIONS - Wound Cleansing / Measurement X - Simple Wound Cleansing - one wound 1 5 []  - Complex Wound Cleansing -  multiple wounds 0 X - Wound Imaging (photographs - any number of wounds) 1 5 []  - Wound Tracing (instead of photographs) 0 X - Simple Wound Measurement - one wound 1 5 []  - Complex Wound Measurement - multiple wounds 0 INTERVENTIONS - Wound Dressings X - Small Wound Dressing one or multiple wounds 1 10 []  - Medium Wound Dressing one or multiple wounds 0 []  - Large Wound Dressing one or multiple wounds 0 X - Application of Medications - topical 1 5 []  - Application of Medications - injection 0 INTERVENTIONS - Miscellaneous []  - External ear exam 0 []  - Specimen Collection (cultures, biopsies, blood, body fluids, etc.) 0 []  - Specimen(s) / Culture(s) sent or taken to Lab for analysis 0 []  - Patient Transfer (multiple staff / Civil Service fast streamer / Similar devices) 0 []  - Simple Staple / Suture removal (25 or less) 0 []  - Complex Staple / Suture removal (26 or more) 0 []  - Hypo / Hyperglycemic Management (close monitor of Blood Glucose) 0 []  - Ankle / Brachial Index (ABI) - do not check if billed separately 0 X - Vital Signs 1 5 Has the patient been seen at the hospital within the last three years: Yes Total Score: 100 Level Of Care: New/Established - Level 3 Electronic Signature(s) Signed: 01/15/2019 5:32:06 PM By: Levan Hurst RN, BSN Entered By: Levan Hurst on 01/14/2019 17:14:34 -------------------------------------------------------------------------------- Multi-Disciplinary Care Plan Details Patient Name: Date of Service: [name], [name] 01/14/2019 3:45 PM Medical Record [id number] Patient Account Number: [id number] Date of Birth/Sex: Treating RN: 01-14-1933 (85 y.o. [name] Primary Care Stephie Xu: [name] Other Clinician: Referring Andric Kerce: Treating  Ladrea Holladay/Extender:Stone III, Lemar Livings, Esperanza Richters in Treatment: 6 Active Inactive Malignancy/Atypical Etiology Nursing Diagnoses: Knowledge deficit related to disease process and management of atypical ulcer etiology Goals: Patient/caregiver will verbalize understanding of disease process and disease management of atypical ulcer etiology Date Initiated: 12/03/2018 Target Resolution Date: 01/28/2019 Goal Status: Active Interventions: Assess patient and family medical history for signs and symptoms of malignancy/atypical etiology upon admission Provide education on atypical ulcer etiologies Notes: Wound/Skin Impairment Nursing Diagnoses: Impaired tissue integrity Knowledge deficit related to ulceration/compromised skin integrity Goals: Patient/caregiver will verbalize understanding of skin care regimen Date Initiated: 12/03/2018 Target Resolution Date: 01/28/2019 Goal Status: Active Ulcer/skin breakdown will have a volume reduction of 30% by week 4 Date Initiated: 12/03/2018 Date Inactivated: 12/31/2018 Target Resolution Date: 12/31/2018 Unmet Reason: tunnel Goal Status: Unmet unroofed Ulcer/skin breakdown will have a volume reduction of 50% by week 8 Date Initiated: 12/31/2018 Target Resolution Date: 01/28/2019 Goal Status: Active Interventions: Assess patient/caregiver ability to obtain necessary supplies Assess patient/caregiver ability to perform ulcer/skin care regimen upon admission and as needed Assess ulceration(s) every visit Treatment Activities: Skin care regimen initiated : 12/03/2018 Topical wound management initiated : 12/03/2018 Notes: Electronic Signature(s) Signed: 01/15/2019 5:32:06 PM By: Levan Hurst RN, BSN Entered By: Levan Hurst on 01/14/2019 17:09:55 -------------------------------------------------------------------------------- Pain Assessment Details Patient Name: Date of Service: Penny Perez, Penny Perez 01/14/2019 3:45 PM Medical Record  [id number] Patient Account Number: [id number] Date of Birth/Sex: Treating RN: 04/17/1932 (85 y.o. [name] Primary Care Aftyn Nott: [name] Other Clinician: Referring Rayann Jolley: Treating Saleha Kalp/Extender:Stone III, Lemar Livings, Esperanza Richters in Treatment: 6 Active Problems Location of Pain Severity and Description of Pain Patient Has Paino Yes Site Locations Pain Location: Pain in Ulcers With Dressing Change: Yes Duration of the Pain. Constant / Intermittento Intermittent How Long Does it Lasto Hours: Minutes: 15 Rate the pain. Current Pain Level: 5 Worst Pain  Level: 7 Least Pain Level: 0 Tolerable Pain Level: 5 Character of Pain Describe the Pain: Aching Pain Management and Medication Current Pain Management: Medication: Yes Cold Application: No Rest: Yes Massage: No Activity: No T.E.N.S.: No Heat Application: No Leg drop or elevation: No Is the Current Pain Management Adequate: Inadequate How does your wound impact your activities of daily livingo Sleep: Yes Bathing: No Appetite: Yes Relationship With Others: No Bladder Continence: No Emotions: No Bowel Continence: No Work: No Toileting: No Drive: No Dressing: No Hobbies: No Electronic Signature(s) Signed: 01/15/2019 9:23:41 AM By: Carlene Coria RN Entered By: Carlene Coria on 01/14/2019 15:55:43 -------------------------------------------------------------------------------- Patient/Caregiver Education Details Patient Name: [name] 12/16/2020andnbsp3:45 Date of Service: PM Medical Record [id number] Number: Patient Account Number: [id number] Treating RN: Date of Birth/Gender: 11-17-1932 (86 y.o. [name]) Other Clinician: Primary Care Physician:Badger, Juluis Rainier Referring Physician: Physician/Extender: Lorelle Gibbs in Treatment: 6 Education Assessment Education Provided To: Patient Education Topics Provided Wound/Skin  Impairment: Methods: Explain/Verbal Responses: State content correctly Electronic Signature(s) Signed: 01/15/2019 5:32:06 PM By: Levan Hurst RN, BSN Entered By: Levan Hurst on 01/14/2019 17:10:05 -------------------------------------------------------------------------------- Wound Assessment Details Patient Name: Date of Service: Penny Perez, Penny Perez 01/14/2019 3:45 PM Medical Record [id number] Patient Account Number: [id number] Date of Birth/Sex: Treating RN: 04-20-1932 (85 y.o. [name] Primary Care Giulian Goldring: [name] Other Clinician: Referring Arnesha Schiraldi: Treating Correne Lalani/Extender:Stone III, Lemar Livings, Esperanza Richters in Treatment: 6 Wound Status Wound Number: 1 Primary Atypical Etiology: Wound Location: Right Gluteus Wound Open Wounding Event: Gradually Appeared Status: Date Acquired: 07/30/2018 Comorbid Cataracts, Asthma, Hypertension, Weeks Of Treatment: 6 History: Osteoarthritis Clustered Wound: No Wound Measurements Length: (cm) 0.7 % Reduct Width: (cm) 0.7 % Reduct Depth: (cm) 1.2 Epitheli Area: (cm) 0.385 Tunneli Volume: (cm) 0.462 Undermi Wound Description Classification: Full Thickness Without Exposed Support Foul Od Structures Slough/ Wound Flat and Intact Margin: Exudate Medium Amount: Exudate Serosanguineous Type: Exudate red, brown Color: Wound Bed Granulation Amount: Medium (34-66%) Granulation Quality: Red Fascia E Necrotic Amount: Medium (34-66%) Fat Laye Necrotic Quality: Adherent Slough Tendon E Muscle E Joint Ex Bone Exp or After Cleansing: No Fibrino Yes Exposed Structure xposed: No r (Subcutaneous Tissue) Exposed: Yes xposed: No xposed: No posed: No osed: No ion in Area: 64% ion in Volume: 46% alization: Small (1-33%) ng: No ning: No Electronic Signature(s) Signed: 01/15/2019 9:23:41 AM By: Carlene Coria RN Entered By: Carlene Coria on 01/14/2019  15:56:13 -------------------------------------------------------------------------------- Vitals Details Patient Name: Date of Service: Penny Perez, Penny Perez 01/14/2019 3:45 PM Medical Record [id number] Patient Account Number: [id number] Date of Birth/Sex: Treating RN: November 29, 1932 (85 y.o. [name] Primary Care Pinkney Venard: [name] Other Clinician: Referring Magalene Mclear: Treating Amrita Radu/Extender:Stone III, Lemar Livings, Esperanza Richters in Treatment: 6 Vital Signs Time Taken: 15:54 Temperature (F): 98.6 Height (in): 60 Pulse (bpm): 75 Weight (lbs): 115 Respiratory Rate (breaths/min): 18 Body Mass Index (BMI): 22.5 Blood Pressure (mmHg): 176/51 Reference Range: 80 - 120 mg / dl Electronic Signature(s) Signed: 01/15/2019 9:23:41 AM By: Carlene Coria RN Entered By: Carlene Coria on 01/14/2019 15:54:59

## 2019-01-28 ENCOUNTER — Encounter (HOSPITAL_BASED_OUTPATIENT_CLINIC_OR_DEPARTMENT_OTHER): Payer: Medicare Other | Admitting: Internal Medicine

## 2019-01-28 ENCOUNTER — Other Ambulatory Visit: Payer: Self-pay

## 2019-01-28 DIAGNOSIS — L98418 Non-pressure chronic ulcer of buttock with other specified severity: Secondary | ICD-10-CM | POA: Diagnosis not present

## 2019-01-28 NOTE — Progress Notes (Signed)
SANYIA, BELLEVUE ([id number]) Visit Report for 01/28/2019 HPI Details Patient Name: Date of Service: Penny Perez, Penny Perez 01/28/2019 3:45 PM Medical Record [id number] Patient Account Number: [id number] Date of Birth/Sex: Treating RN: 1932-10-05 (85 y.o. F) Primary Care Provider: [name] Other Clinician: Referring Provider: Treating Provider/Extender:Ingvald Theisen, Drue Dun, Esperanza Richters in Treatment: 8 History of Present Illness HPI Description: 12/03/2018 on evaluation today patient appears for initial inspection here in our clinic concerning an issue that she has been having a light having in the right gluteal region with an ulcer which initially she describes as having been a knot which when she went to see the dermatologist was biopsied and this was on 08/26/2018. Subsequently that revealed what appeared to be an inflamed ulcer with granulation tissue. Fortunately there was no signs of active infection based on what they saw on pathology. The patient states that since the biopsy she has been having a lot of pain. Fortunately there is no signs of active infection at this time no fever chills noted. She does have a history of hypertension but really no major medical problems significant otherwise although she does have a lot of allergies including allergies to [facility] as well as antibiotics. In fact she has some ulcers in her mouth secondary to having been prescribed an antibiotic for the current ulcer. She states she has been having trouble sleeping due to the pain in the ulcer area. 12/10/2018 on evaluation today patient's wound actually showed signs of improvement as far as the loosening up of the necrotic tissue is concerned at this point. I do believe that that has done excellent at this point I think that we need to try and clear away some of the necrotic tissue in the base of the wound I discussed that with the patient as well. Fortunately she is not having any  significant pain and states the Santyl did not burn although in the past she notes that it did cause her some trouble with the ulcer on her leg causing some burning. No fevers, chills, nausea, vomiting, or diarrhea. 12/17/2018 on evaluation today patient appears to be doing well with regard to her wound. This does not appear to be any more deep than it was after the last evaluation and debridement last week. With that being said there are some necrotic tissue on the surface of the wound which is can require some sharp debridement at this point. Fortunately there is no evidence of active infection at this time which is good news. No fevers, chills, nausea, vomiting, or diarrhea. 12/31/2018 on evaluation today patient appears to be doing a little better in regard to the overall appearance of her wound currently. She is tolerating the dressing changes without complication. Fortunately there is no signs of active infection at this time. With that being said she still has some necrotic tissue in the base of the wound that still needs to be removed this is very tough however and I was not able to debrided away last week. She really does not want me to perform any debridement today either due to how bad it hurt after I did this last time. With that being said I think we may be able to switch the dressing to something else to try to help with clearing this out a little better. 01/14/2019 upon evaluation today patient actually appears to be doing better with regard to her wound. She is showing signs of new granulation tissue and I do feel like that this is starting to improve. That is  good news. Fortunately there is no evidence of active infection at this point. No fevers, chills, nausea, vomiting, or diarrhea. 12/30-Patient returns at 2 weeks, she does complain of some discomfort at night in the right gluteal area, she is obviously active and stays on her feet and not in a chair during the day. Denies any  fevers chills shakes. Complains that the area on the ulcer feels wet fairly often Electronic Signature(s) Signed: 01/28/2019 4:59:38 PM By: Tobi Bastos MD, MBA Entered By: Tobi Bastos on 01/28/2019 16:59:38 -------------------------------------------------------------------------------- Physical Exam Details Patient Name: Date of Service: Penny Perez, Penny Perez 01/28/2019 3:45 PM Medical Record [id number] Patient Account Number: [id number] Date of Birth/Sex: Treating RN: Aug 27, 1932 (85 y.o. F) Primary Care Provider: [name] Other Clinician: Referring Provider: Treating Provider/Extender:[place] Jeff, Drue Dun, Esperanza Richters in Treatment: 8 Constitutional alert and oriented x 3. sitting or standing blood pressure is within target range for patient.. supine blood pressure is within target range for patient.. pulse regular and within target range for patient.Marland Kitchen respirations regular, non-labored and within target range for patient.Marland Kitchen temperature within target range for patient.. . . Well-nourished and well-hydrated in no acute distress. Notes The right gluteal wound extends up to 1.5 to 2 cm straight end, no tunneling is noted, I cleaned out some debris using a Q-tip, the lower edge of the wound has induration but it only extends very minimally. Electronic Signature(s) Signed: 01/28/2019 5:00:36 PM By: Tobi Bastos MD, MBA Entered By: Tobi Bastos on 01/28/2019 17:00:36 -------------------------------------------------------------------------------- Physician Orders Details Patient Name: Date of Service: Penny Perez, Penny Perez 01/28/2019 3:45 PM Medical Record [id number] Patient Account Number: [id number] Date of Birth/Sex: Treating RN: 18-May-1932 (85 y.o. 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 Dressing Change Frequency Wound #1 Right Gluteus Change dressing three times week. Wound Cleansing Wound #1 Right Gluteus May shower and wash wound with soap and water. - with dressing changes Primary Wound Dressing Wound #1 Right Gluteus Other: - anasept moistened gauze in clinic. Use Dakin's solution at home. Secondary Dressing Wound #1 Right Gluteus Foam Border - or large [facility] skilled nursing for wound care. - [facility] Venous Studies -Unilateral left lower leg - *URGENT* left lower leg edema, r/o DVT Electronic Signature(s) Signed: 01/28/2019 5:10:58 PM By: Tobi Bastos MD, MBA Signed: 01/28/2019 5:40:33 PM By: Baruch Gouty RN, BSN Entered By: Baruch Gouty on 01/28/2019 16:56:34 -------------------------------------------------------------------------------- Prescription 01/28/2019 Patient Name: Penny Perez Provider: Tobi Bastos MD Date of Birth: Mar 22, 1932 NPI#: [id number] Sex: F DEA#: [id number] Phone #: [phone] License #: Patient Address: [place] [address] [place], [place] [address] [address] [place], [place] [address] [phone] Allergies ampicillin Reaction: fever Severity: Moderate Cipro Reaction: blisters in mouth Severity: Moderate cephalexin Reaction: unknown Severity: Mild clarithromycin Reaction: unknown codeine Reaction: unknown doxycycline Reaction: unknown erythromycin base Reaction: unknown gatifloxacin Reaction: blisters in mouth hydrocodone Reaction: unknown penicillin Reaction: blisters in mouth Sulfa  (Sulfonamide Antibiotics) Reaction: abdominal pain and bloating Provider's Orders Venous Studies -Unilateral left lower leg - *URGENT* left lower leg edema, r/o DVT Signature(s): Date(s): Electronic Signature(s) Signed: 01/28/2019 5:10:58 PM By: Tobi Bastos MD, MBA Signed:  01/28/2019 5:40:33 PM By: Baruch Gouty RN, BSN Entered By: Baruch Gouty on 01/28/2019 16:56:34 --------------------------------------------------------------------------------  Problem List Details Patient Name: Date of Service: Penny Perez, Penny Perez 01/28/2019 3:45 PM Medical Record [id number] Patient Account Number: [id number] Date of Birth/Sex: Treating RN: 26-Jun-1932 (85 y.o. 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 with other 12/03/2018 No Yes specified severity I10 Essential (primary) hypertension 12/03/2018 No Yes Inactive Problems Resolved Problems Electronic Signature(s) Signed: 01/28/2019 5:10:58 PM By: Tobi Bastos MD, MBA Signed: 01/28/2019 5:40:33 PM By: Baruch Gouty RN, BSN Entered By: Baruch Gouty on 01/28/2019 16:48:13 -------------------------------------------------------------------------------- Progress Note Details Patient Name: Date of Service: Penny Perez, Penny Perez 01/28/2019 3:45 PM Medical Record [id number] Patient Account Number: [id number] Date of Birth/Sex: Treating RN: [birth year] (85 y.o. F) Primary Care Provider: [name] Other Clinician: Referring Provider: Treating Provider/Extender:Ridley Dileo, Drue Dun, Esperanza Richters in Treatment: 8 Subjective History of Present Illness  (HPI) 12/03/2018 on evaluation today patient appears for initial inspection here in our clinic concerning an issue that she has been having a light having in the right gluteal region with an ulcer which initially she describes as having been a knot which when she went to see the dermatologist was biopsied and this was on 08/26/2018. Subsequently that revealed what appeared to be an inflamed ulcer with granulation tissue. Fortunately there was no signs of active infection based on what they saw on pathology. The patient states that since the biopsy she has been having a lot of pain. Fortunately there is no signs of active infection at this time no fever chills noted. She does have a history of hypertension but really no major medical problems significant otherwise although she does have a lot of allergies including allergies to [facility] as well as antibiotics. In fact she has some ulcers in her mouth secondary to having been prescribed an antibiotic for the current ulcer. She states she has been having trouble sleeping due to the pain in the ulcer area. 12/10/2018 on evaluation today patient's wound actually showed signs of improvement as far as the loosening up of the necrotic tissue is concerned at this point. I do believe that that has done excellent at this point I think that we need to try and clear away some of the necrotic tissue in the base of the wound I discussed that with the patient as well. Fortunately she is not having any significant pain and states the Santyl did not burn although in the past she notes that it did cause her some trouble with the ulcer on her leg causing some burning. No fevers, chills, nausea, vomiting, or diarrhea. 12/17/2018 on evaluation today patient appears to be doing well with regard to her wound. This does not appear to be any more deep than it was after the last evaluation and debridement last week. With that being said there are some necrotic tissue on the  surface of the wound which is can require some sharp debridement at this point. Fortunately there is no evidence of active infection at this time which is good news. No fevers, chills, nausea, vomiting, or diarrhea. 12/31/2018 on evaluation today patient appears to be doing a little better in regard to the overall appearance of her wound currently. She is tolerating the dressing changes without complication. Fortunately  there is no signs of active infection at this time. With that being said she still has some necrotic tissue in the base of the wound that still needs to be removed this is very tough however and I was not able to debrided away last week. She really does not want me to perform any debridement today either due to how bad it hurt after I did this last time. With that being said I think we may be able to switch the dressing to something else to try to help with clearing this out a little better. 01/14/2019 upon evaluation today patient actually appears to be doing better with regard to her wound. She is showing signs of new granulation tissue and I do feel like that this is starting to improve. That is good news. Fortunately there is no evidence of active infection at this point. No fevers, chills, nausea, vomiting, or diarrhea. 12/30-Patient returns at 2 weeks, she does complain of some discomfort at night in the right gluteal area, she is obviously active and stays on her feet and not in a chair during the day. Denies any fevers chills shakes. Complains that the area on the ulcer feels wet fairly often Patient also complaining about her left leg being swollen off recent onset also complaining of some calf pain Objective Constitutional alert and oriented x 3. sitting or standing blood pressure is within target range for patient.. supine blood pressure is within target range for patient.. pulse regular and within target range for patient.Marland Kitchen respirations regular, non-labored and within  target range for patient.Marland Kitchen temperature within target range for patient.. Well-nourished and well-hydrated in no acute distress. Vitals Time Taken: 4:04 PM, Height: 60 in, Weight: 115 lbs, BMI: 22.5, Temperature: 98.6 F, Pulse: 71 bpm, Respiratory Rate: 16 breaths/min, Blood Pressure: 169/78 mmHg. General Notes: The right gluteal wound extends up to 1.5 to 2 cm straight end, no tunneling is noted, I cleaned out some debris using a Q-tip, the lower edge of the wound has induration but it only extends very minimally. Integumentary (Hair, Skin) Wound #1 status is Open. Original cause of wound was Gradually Appeared. The wound is located on the Right Gluteus. The wound measures 0.9cm length x 1.1cm width x 1.2cm depth; 0.778cm^2 area and 0.933cm^3 volume. There is Fat Layer (Subcutaneous Tissue) Exposed exposed. There is no tunneling or undermining noted. There is a medium amount of serosanguineous drainage noted. The wound margin is flat and intact. There is medium (34-66%) pink granulation within the wound bed. There is a medium (34-66%) amount of necrotic tissue within the wound bed including Adherent Slough. 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 Dressing Change Frequency: Wound #1 Right Gluteus: Change dressing three times week. Wound Cleansing: Wound #1 Right Gluteus: May shower and wash wound with soap and water. - with dressing changes Primary Wound Dressing: Wound #1 Right Gluteus: Other: - anasept moistened gauze in clinic. Use Dakin's solution at home. Secondary Dressing: Wound #1 Right Gluteus: Foam Border - or large bandaid Home Health: [facility] skilled nursing for wound care. - [facility] ordered were: Venous Studies  -Unilateral left lower leg - *URGENT* left lower leg edema, r/o DVT 1. Continue using Dakin's gauze soaks to the wound, with home health changing the dressings every other day 2. Given new complaints of left leg swelling with  pain I will get a Doppler ultrasound 3. Return to clinic in 2 weeks Electronic Signature(s) Signed: 01/28/2019 5:01:39 PM By: Tobi Bastos MD, MBA Entered By: Tobi Bastos on 01/28/2019 17:01:39 -------------------------------------------------------------------------------- SuperBill Details Patient Name: Date of Service: Penny Perez, Penny Perez 01/28/2019 Medical Record [id number] Patient Account Number: [id number] Date of Birth/Sex: Treating RN: [birth year] (86 y.o. 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

## 2019-01-29 ENCOUNTER — Other Ambulatory Visit: Payer: Self-pay

## 2019-01-29 NOTE — Progress Notes (Signed)
Penny Perez ([id number]) Visit Report for 01/28/2019 Arrival Information Details Patient Name: Date of Service: Penny Perez, Penny Perez 01/28/2019 3:45 PM Medical Record [id number] Patient Account Number: [id number] Date of Birth/Sex: Treating RN: [birth year] (85 y.o. [name] Primary Care Jessly Lebeck: [name] Other Clinician: Referring Daily Doe: Treating Danaysia Rader/Extender:Madduri, Drue Dun, Esperanza Richters in Treatment: 8 Visit Information History Since Last Visit Added or deleted any medications: No Patient Arrived: Ambulatory Any new allergies or adverse reactions: No Arrival Time: 16:02 Had a fall or experienced change in No Accompanied By: alone activities of daily living that may affect Transfer Assistance: None risk of falls: Patient Identification Verified: Yes Signs or symptoms of abuse/neglect since last No Secondary Verification Process Yes visito Completed: Hospitalized since last visit: No Patient Requires Transmission-Based No Implantable device outside of the clinic excluding No Precautions: cellular tissue based products placed in the center Patient Has Alerts: No since last visit: Has Dressing in Place as Prescribed: Yes Pain Present Now: No Electronic Signature(s) Signed: 01/29/2019 3:10:57 PM By: Levan Hurst RN, BSN Entered By: Levan Hurst on 01/28/2019 16:03:01 -------------------------------------------------------------------------------- Clinic Level of Care Assessment Details Patient Name: Date of Service: Penny Perez 01/28/2019 3:45 PM Medical Record [id number] Patient Account Number: [id number] Date of Birth/Sex: Treating RN: 1932/04/18 (85 y.o. [name] Primary Care Adithi Gammon: [name] Other Clinician: Referring Anthonette Lesage: Treating Cashe Gatt/Extender:Madduri, Drue Dun, Esperanza Richters in Treatment: 8 Clinic Level of Care Assessment Items TOOL 4 Quantity Score []  - Use when  only an EandM is performed on FOLLOW-UP visit 0 ASSESSMENTS - Nursing Assessment / Reassessment X - Reassessment of Co-morbidities (includes updates in patient status) 1 10 X - Reassessment of Adherence to Treatment Plan 1 5 ASSESSMENTS - Wound and Skin Assessment / Reassessment X - Simple Wound Assessment / Reassessment - one wound 1 5 []  - Complex Wound Assessment / Reassessment - multiple wounds 0 []  - Dermatologic / Skin Assessment (not related to wound area) 0 ASSESSMENTS - Focused Assessment []  - Circumferential Edema Measurements - multi extremities 0 []  - Nutritional Assessment / Counseling / Intervention 0 []  - Lower Extremity Assessment (monofilament, tuning fork, pulses) 0 []  - Peripheral Arterial Disease Assessment (using hand held doppler) 0 ASSESSMENTS - Ostomy and/or Continence Assessment and Care []  - Incontinence Assessment and Management 0 []  - Ostomy Care Assessment and Management (repouching, etc.) 0 PROCESS - Coordination of Care X - Simple Patient / Family Education for ongoing care 1 15 []  - Complex (extensive) Patient / Family Education for ongoing care 0 X - Staff obtains Programmer, systems, Records, Test Results / Process Orders 1 10 []  - Staff telephones HHA, Nursing Homes / Clarify orders / etc 0 []  - Routine Transfer to another Facility (non-emergent condition) 0 []  - Routine Hospital Admission (non-emergent condition) 0 []  - New Admissions / Biomedical engineer / Ordering NPWT, Apligraf, etc. 0 []  - Emergency Hospital Admission (emergent condition) 0 X - Simple Discharge Coordination 1 10 []  - Complex (extensive) Discharge Coordination 0 PROCESS - Special Needs []  - Pediatric / Minor Patient Management 0 []  - Isolation Patient Management 0 []  - Hearing / Language / Visual special needs 0 []  - Assessment of Community assistance (transportation, D/C planning, etc.) 0 []  - Additional assistance / Altered mentation 0 []  - Support Surface(s) Assessment (bed,  cushion, seat, etc.) 0 INTERVENTIONS - Wound Cleansing / Measurement X - Simple Wound Cleansing - one wound 1 5 []  - Complex Wound Cleansing - multiple wounds 0 X - Wound Imaging (photographs - any number  of wounds) 1 5 []  - Wound Tracing (instead of photographs) 0 X - Simple Wound Measurement - one wound 1 5 []  - Complex Wound Measurement - multiple wounds 0 INTERVENTIONS - Wound Dressings X - Small Wound Dressing one or multiple wounds 1 10 []  - Medium Wound Dressing one or multiple wounds 0 []  - Large Wound Dressing one or multiple wounds 0 X - Application of Medications - topical 1 5 []  - Application of Medications - injection 0 INTERVENTIONS - Miscellaneous []  - External ear exam 0 []  - Specimen Collection (cultures, biopsies, blood, body fluids, etc.) 0 []  - Specimen(s) / Culture(s) sent or taken to Lab for analysis 0 []  - Patient Transfer (multiple staff / Civil Service fast streamer / Similar devices) 0 []  - Simple Staple / Suture removal (25 or less) 0 []  - Complex Staple / Suture removal (26 or more) 0 []  - Hypo / Hyperglycemic Management (close monitor of Blood Glucose) 0 []  - Ankle / Brachial Index (ABI) - do not check if billed separately 0 X - Vital Signs 1 5 Has the patient been seen at the hospital within the last three years: Yes Total Score: 90 Level Of Care: New/Established - Level 3 Electronic Signature(s) Signed: 01/28/2019 5:40:33 PM By: Baruch Gouty RN, BSN Entered By: Baruch Gouty on 01/28/2019 16:48:08 -------------------------------------------------------------------------------- Multi-Disciplinary Care Plan Details Patient Name: Date of Service: Penny Perez 01/28/2019 3:45 PM Medical Record [id number] Patient Account Number: [id number] Date of Birth/Sex: Treating RN: February 05, 1932 (85 y.o. [name] Primary Care Khara Renaud: [name] Other Clinician: Referring Madelyne Millikan: Treating Kasean Denherder/Extender:Madduri, Drue Dun, Esperanza Richters  in Treatment: 8 Active Inactive Malignancy/Atypical Etiology Nursing Diagnoses: Knowledge deficit related to disease process and management of atypical ulcer etiology Goals: Patient/caregiver will verbalize understanding of disease process and disease management of atypical ulcer etiology Date Initiated: 12/03/2018 Target Resolution Date: 02/25/2019 Goal Status: Active Interventions: Assess patient and family medical history for signs and symptoms of malignancy/atypical etiology upon admission Provide education on atypical ulcer etiologies Notes: Wound/Skin Impairment Nursing Diagnoses: Impaired tissue integrity Knowledge deficit related to ulceration/compromised skin integrity Goals: Patient/caregiver will verbalize understanding of skin care regimen Date Initiated: 12/03/2018 Target Resolution Date: 02/25/2019 Goal Status: Active Ulcer/skin breakdown will have a volume reduction of 30% by week 4 Date Initiated: 12/03/2018 Date Inactivated: 12/31/2018 Target Resolution Date: 12/31/2018 Unmet Reason: tunnel Goal Status: Unmet unroofed Ulcer/skin breakdown will have a volume reduction of 50% by week 8 Date Inactivated: 01/28/2019 Target12/30/2020 Resolution Date Initiated: 12/31/2018 Date: Unmet Reason: multiple Goal Status: Unmet comorbidities Interventions: Assess patient/caregiver ability to obtain necessary supplies Assess patient/caregiver ability to perform ulcer/skin care regimen upon admission and as needed Assess ulceration(s) every visit Treatment Activities: Skin care regimen initiated : 12/03/2018 Topical wound management initiated : 12/03/2018 Notes: Electronic Signature(s) Signed: 01/28/2019 5:40:33 PM By: Baruch Gouty RN, BSN Entered By: Baruch Gouty on 01/28/2019 16:45:31 -------------------------------------------------------------------------------- Pain Assessment Details Patient Name: Date of Service: DAUN, PALM[place] 01/28/2019 3:45 PM Medical  Record [id number] Patient Account Number: [id number] Date of Birth/Sex: Treating RN: 05/05/1932 (85 y.o. [name] Primary Care Reese Senk: [name] Other Clinician: Referring Evalena Fujii: Treating Suhani Stillion/Extender:Madduri, Drue Dun, Esperanza Richters in Treatment: 8 Active Problems Location of Pain Severity and Description of Pain Patient Has Paino No Site Locations Pain Management and Medication Current Pain Management: Electronic Signature(s) Signed: 01/29/2019 3:10:57 PM By: Levan Hurst RN, BSN Entered By: Levan Hurst on 01/28/2019 16:05:12 -------------------------------------------------------------------------------- Patient/Caregiver Education Details Patient Name: [name] 12/30/2020andnbsp3:45 Date of Service: PM Medical Record [id number]  Number: Patient Account Number: [id number] Treating RN: 1932/08/23 (85 y.o. Baruch Gouty Date of Birth/Gender: F) Other Clinician: Primary Care Physician:Badger, Carmin Muskrat Referring Physician: Physician/Extender: Lorelle Gibbs in Treatment: 8 Education Assessment Education Provided To: Patient Education Topics Provided Wound/Skin Impairment: Methods: Explain/Verbal Responses: Reinforcements needed, State content correctly Electronic Signature(s) Signed: 01/28/2019 5:40:33 PM By: Baruch Gouty RN, BSN Entered By: Baruch Gouty on 01/28/2019 16:47:37 -------------------------------------------------------------------------------- Wound Assessment Details Patient Name: Date of Service: REBEKKAH, GOLA 01/28/2019 3:45 PM Medical Record [id number] Patient Account Number: [id number] Date of Birth/Sex: Treating RN: Nov 27, 1932 (85 y.o. [name] Primary Care Brea Coleson: [name] Other Clinician: Referring Arris Meyn: Treating Jacobi Ryant/Extender:Madduri, Drue Dun, Esperanza Richters in Treatment: 8 Wound Status Wound Number: 1 Primary  Atypical Etiology: Wound Location: Right Gluteus Wound Open Wounding Event: Gradually Appeared Status: Date Acquired: 07/30/2018 Comorbid Cataracts, Asthma, Hypertension, Weeks Of Treatment: 8 History: Osteoarthritis Clustered Wound: No Wound Measurements Length: (cm) 0.9 % Reduction Width: (cm) 1.1 % Reduction Depth: (cm) 1.2 Epitheliali Area: (cm) 0.778 Tunneli Volume: (cm) 0.933 Undermi Wound Description Full Thickness Without Exposed Support Foul Od Classification: Structures Slough/ Wound Flat and Intact Margin: Exudate Medium Amount: Exudate Serosanguineous Type: Exudate red, brown Color: Wound Bed Granulation Amount: Medium (34-66%) Granulation Quality: Pink Fascia E Necrotic Amount: Medium (34-66%) Fat Laye Necrotic Quality: Adherent Slough Tendon E Muscle E Joint Ex Bone Exp or After Cleansing: No Fibrino Yes Exposed Structure xposed: No r (Subcutaneous Tissue) Exposed: Yes xposed: No xposed: No posed: No osed: No in Area: 27.2% in Volume: -9.1% zation: Small (1-33%) ng: No ning: No Electronic Signature(s) Signed: 01/29/2019 3:10:57 PM By: Levan Hurst RN, BSN Entered By: Levan Hurst on 01/28/2019 16:11:08 -------------------------------------------------------------------------------- Vitals Details Patient Name: Date of Service: BRANDYCE, SALMI 01/28/2019 3:45 PM Medical Record [id number] Patient Account Number: [id number] Date of Birth/Sex: Treating RN: 26-Nov-1932 (84 y.o. [name] Primary Care Jorell Agne: [name] Other Clinician: Referring Keenan Dimitrov: Treating Sabreen Kitchen/Extender:Madduri, Drue Dun, Esperanza Richters in Treatment: 8 Vital Signs Time Taken: 16:04 Temperature (F): 98.6 Height (in): 60 Pulse (bpm): 71 Weight (lbs): 115 Respiratory Rate (breaths/min): 16 Body Mass Index (BMI): 22.5 Blood Pressure (mmHg): 169/78 Reference Range: 80 - 120 mg / dl Electronic Signature(s) Signed:  01/29/2019 3:10:57 PM By: Levan Hurst RN, BSN Entered By: Levan Hurst on 01/28/2019 16:05:06

## 2019-02-02 ENCOUNTER — Other Ambulatory Visit (HOSPITAL_COMMUNITY): Payer: Self-pay | Admitting: Internal Medicine

## 2019-02-02 DIAGNOSIS — R609 Edema, unspecified: Secondary | ICD-10-CM

## 2019-02-04 ENCOUNTER — Ambulatory Visit (HOSPITAL_COMMUNITY)
Admission: RE | Admit: 2019-02-04 | Discharge: 2019-02-04 | Disposition: A | Discharge status: Home / Self Care | Payer: Medicare Other | Source: Ambulatory Visit | Attending: Internal Medicine | Admitting: Internal Medicine

## 2019-02-04 ENCOUNTER — Other Ambulatory Visit: Payer: Self-pay

## 2019-02-04 ENCOUNTER — Encounter (HOSPITAL_COMMUNITY): Payer: Medicare Other

## 2019-02-04 DIAGNOSIS — R609 Edema, unspecified: Secondary | ICD-10-CM | POA: Diagnosis present

## 2019-02-04 NOTE — Progress Notes (Signed)
Left lower extremity venous duplex completed. Preliminary results can be found under CV proc under chart review.  02/04/2019 2:41 PM Judeth Cornfield Lianette Broussard, RDMS, RVT

## 2019-02-10 ENCOUNTER — Encounter (HOSPITAL_COMMUNITY): Payer: Medicare Other

## 2019-02-11 ENCOUNTER — Other Ambulatory Visit: Payer: Self-pay

## 2019-02-11 ENCOUNTER — Encounter (HOSPITAL_BASED_OUTPATIENT_CLINIC_OR_DEPARTMENT_OTHER): Payer: Medicare Other | Attending: Physician Assistant | Admitting: Physician Assistant

## 2019-02-11 DIAGNOSIS — L98418 Non-pressure chronic ulcer of buttock with other specified severity: Secondary | ICD-10-CM | POA: Insufficient documentation

## 2019-02-11 DIAGNOSIS — J45909 Unspecified asthma, uncomplicated: Secondary | ICD-10-CM | POA: Diagnosis not present

## 2019-02-11 DIAGNOSIS — S31819A Unspecified open wound of right buttock, initial encounter: Secondary | ICD-10-CM | POA: Insufficient documentation

## 2019-02-11 DIAGNOSIS — I10 Essential (primary) hypertension: Secondary | ICD-10-CM | POA: Diagnosis not present

## 2019-02-11 DIAGNOSIS — X58XXXA Exposure to other specified factors, initial encounter: Secondary | ICD-10-CM | POA: Insufficient documentation

## 2019-02-11 DIAGNOSIS — M1990 Unspecified osteoarthritis, unspecified site: Secondary | ICD-10-CM | POA: Insufficient documentation

## 2019-02-11 NOTE — Progress Notes (Addendum)
GRACEANNA, MAMULA ([id number]) Visit Report for 02/11/2019 Chief Complaint Document Details Patient Name: Date of Service: BERDENE, LUSKIN 02/11/2019 3:45 PM Medical Record [id number] Patient Account Number: [id number] Date of Birth/Sex: Treating RN: 1932/10/26 (86 y.o. F) Primary Care Provider: [name] Other Clinician: Referring Provider: Treating Provider/Extender:Stone III, Lemar Livings, Esperanza Richters in Treatment: 10 Information Obtained from: Patient Chief Complaint Right buttock ulcer Electronic Signature(s) Signed: 02/11/2019 4:14:39 PM By: Worthy Keeler PA-C Entered By: Worthy Keeler on 02/11/2019 16:14:38 -------------------------------------------------------------------------------- HPI Details Patient Name: Date of Service: KAYLEN, PELTS 02/11/2019 3:45 PM Medical Record [id number] Patient Account Number: [id number] Date of Birth/Sex: Treating RN: May 04, 1932 (86 y.o. F) Primary Care Provider: [name] Other Clinician: Referring Provider: Treating Provider/Extender:Stone III, Lemar Livings, Esperanza Richters in Treatment: 10 History of Present Illness HPI Description: 12/03/2018 on evaluation today patient appears for initial inspection here in our clinic concerning an issue that she has been having a light having in the right gluteal region with an ulcer which initially she describes as having been a knot which when she went to see the dermatologist was biopsied and this was on 08/26/2018. Subsequently that revealed what appeared to be an inflamed ulcer with granulation tissue. Fortunately there was no signs of active infection based on what they saw on pathology. The patient states that since the biopsy she has been having a lot of pain. Fortunately there is no signs of active infection at this time no fever chills noted. She does have a history of hypertension but really no major medical problems significant otherwise although she does  have a lot of allergies including allergies to [facility] as well as antibiotics. In fact she has some ulcers in her mouth secondary to having been prescribed an antibiotic for the current ulcer. She states she has been having trouble sleeping due to the pain in the ulcer area. 12/10/2018 on evaluation today patient's wound actually showed signs of improvement as far as the loosening up of the necrotic tissue is concerned at this point. I do believe that that has done excellent at this point I think that we need to try and clear away some of the necrotic tissue in the base of the wound I discussed that with the patient as well. Fortunately she is not having any significant pain and states the Santyl did not burn although in the past she notes that it did cause her some trouble with the ulcer on her leg causing some burning. No fevers, chills, nausea, vomiting, or diarrhea. 12/17/2018 on evaluation today patient appears to be doing well with regard to her wound. This does not appear to be any more deep than it was after the last evaluation and debridement last week. With that being said there are some necrotic tissue on the surface of the wound which is can require some sharp debridement at this point. Fortunately there is no evidence of active infection at this time which is good news. No fevers, chills, nausea, vomiting, or diarrhea. 12/31/2018 on evaluation today patient appears to be doing a little better in regard to the overall appearance of her wound currently. She is tolerating the dressing changes without complication. Fortunately there is no signs of active infection at this time. With that being said she still has some necrotic tissue in the base of the wound that still needs to be removed this is very tough however and I was not able to debrided away last week. She really does not want me to perform any debridement  today either due to how bad it hurt after I did this last time. With that  being said I think we may be able to switch the dressing to something else to try to help with clearing this out a little better. 01/14/2019 upon evaluation today patient actually appears to be doing better with regard to her wound. She is showing signs of new granulation tissue and I do feel like that this is starting to improve. That is good news. Fortunately there is no evidence of active infection at this point. No fevers, chills, nausea, vomiting, or diarrhea. 12/30-Patient returns at 2 weeks, she does complain of some discomfort at night in the right gluteal area, she is obviously active and stays on her feet and not in a chair during the day. Denies any fevers chills shakes. Complains that the area on the ulcer feels wet fairly often 02/11/2019 on evaluation today patient appears to be doing okay with regard to her wound. She has been tolerating the dressing changes without complication. Fortunately there is no signs of active infection at this time. No fevers, chills, nausea, vomiting, or diarrhea. She does note however that she is having issues financially with having to come as frequently even as every other week. She states that she is not sure she is going to be able to continue to do so. Fortunately there is no signs of anything doing poorly I think we may be able to spread this out for her in order to help alleviate some of this especially in light of the fact that she does have home health coming out. Electronic Signature(s) Signed: 02/11/2019 5:39:34 PM By: Worthy Keeler PA-C Entered By: Worthy Keeler on 02/11/2019 17:39:33 -------------------------------------------------------------------------------- Physical Exam Details Patient Name: Date of Service: FADRA, ZEGAR 02/11/2019 3:45 PM Medical Record [id number] Patient Account Number: [id number] Date of Birth/Sex: Treating RN: January 04, 1933 (86 y.o. F) Primary Care Provider: [name] Other  Clinician: Referring Provider: Treating Provider/Extender:Stone III, Lemar Livings, Esperanza Richters in Treatment: 40 Constitutional Well-nourished and well-hydrated in no acute distress. Respiratory normal breathing without difficulty. Psychiatric this patient is able to make decisions and demonstrates good insight into disease process. Alert and Oriented x 3. pleasant and cooperative. Notes His wound bed currently showed signs of good granulation in some areas of the wound at this point. She does have some connective tissue/fascia noted in the base of the wound was to try to get this to granulate over. With that being said I think it is good to take some time and I think we may be able to spread out her follow-up visits as such. Electronic Signature(s) Signed: 02/11/2019 5:39:55 PM By: Worthy Keeler PA-C Entered By: Worthy Keeler on 02/11/2019 17:39:55 -------------------------------------------------------------------------------- Physician Orders Details Patient Name: Date of Service: JUNI, FASS 02/11/2019 3:45 PM Medical Record [id number] Patient Account Number: [id number] Date of Birth/Sex: Treating RN: 04/08/1932 (86 y.o. 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 Dressing Change Frequency Wound #1 Right Gluteus Change dressing three times week. Wound Cleansing Wound #1 Right Gluteus Clean wound with Normal Saline. May shower and wash wound with soap and water. - with dressing changes Primary Wound Dressing Wound #  1  Right Gluteus Silver Collagen - pack into base of tunnel Plain packing strip - behind collagen Secondary Dressing Wound #1 Right Gluteus Foam Border - or large [facility] [place] skilled nursing for wound care. Lajean Manes Electronic Signature(s) Signed: 02/11/2019 5:55:19 PM By: Worthy Keeler PA-C Signed: 02/11/2019 6:34:17 PM By: Baruch Gouty RN, BSN Entered By: Baruch Gouty on 02/11/2019 17:25:05 -------------------------------------------------------------------------------- Problem List Details Patient Name: Date of Service: JAMAIYAH, RECHNER 02/11/2019 3:45 PM Medical Record [id number] Patient Account Number: [id number] Date of Birth/Sex: Treating RN: 12-31-1932 (86 y.o. F) Primary Care Provider: [name] Other Clinician: Referring Provider: Treating Provider/Extender:Stone III, Lemar Livings, Esperanza Richters in Treatment: 10 Active Problems ICD-10 Evaluated Encounter Code Description Active Date Today Diagnosis S31.819A Unspecified open wound of right buttock, initial 12/03/2018 No Yes encounter L98.418 Non-pressure chronic ulcer of buttock with other 12/03/2018 No Yes specified severity I10 Essential (primary) hypertension 12/03/2018 No Yes Inactive Problems Resolved Problems Electronic Signature(s) Signed: 02/11/2019 4:14:33 PM By: Worthy Keeler PA-C Entered By: Worthy Keeler on 02/11/2019 16:14:33 -------------------------------------------------------------------------------- Progress Note Details Patient Name: Date of Service: MICALAH, MARSHELL 02/11/2019 3:45 PM Medical Record [id number] Patient Account Number: [id number] Date of Birth/Sex: Treating RN: [birth year] (86 y.o. F) Primary Care Provider: Other Clinician: [name] Referring Provider: Treating Provider/Extender:Stone III, Lemar Livings, Esperanza Richters in Treatment: 10 Subjective Chief Complaint Information obtained from Patient Right buttock  ulcer History of Present Illness (HPI) 12/03/2018 on evaluation today patient appears for initial inspection here in our clinic concerning an issue that she has been having a light having in the right gluteal region with an ulcer which initially she describes as having been a knot which when she went to see the dermatologist was biopsied and this was on 08/26/2018. Subsequently that revealed what appeared to be an inflamed ulcer with granulation tissue. Fortunately there was no signs of active infection based on what they saw on pathology. The patient states that since the biopsy she has been having a lot of pain. Fortunately there is no signs of active infection at this time no fever chills noted. She does have a history of hypertension but really no major medical problems significant otherwise although she does have a lot of allergies including allergies to [facility] as well as antibiotics. In fact she has some ulcers in her mouth secondary to having been prescribed an antibiotic for the current ulcer. She states she has been having trouble sleeping due to the pain in the ulcer area. 12/10/2018 on evaluation today patient's wound actually showed signs of improvement as far as the loosening up of the necrotic tissue is concerned at this point. I do believe that that has done excellent at this point I think that we need to try and clear away some of the necrotic tissue in the base of the wound I discussed that with the patient as well. Fortunately she is not having any significant pain and states the Santyl did not burn although in the past she notes that it did cause her some trouble with the ulcer on her leg causing some burning. No fevers, chills, nausea, vomiting, or diarrhea. 12/17/2018 on evaluation today patient appears to be doing well with regard to her wound. This does not appear to be any more deep than it was after the last evaluation and debridement last week. With that being said  there are some necrotic tissue on the surface of the wound which is can require some sharp debridement at this  point. Fortunately there is no evidence of active infection at this time which is good news. No fevers, chills, nausea, vomiting, or diarrhea. 12/31/2018 on evaluation today patient appears to be doing a little better in regard to the overall appearance of her wound currently. She is tolerating the dressing changes without complication. Fortunately there is no signs of active infection at this time. With that being said she still has some necrotic tissue in the base of the wound that still needs to be removed this is very tough however and I was not able to debrided away last week. She really does not want me to perform any debridement today either due to how bad it hurt after I did this last time. With that being said I think we may be able to switch the dressing to something else to try to help with clearing this out a little better. 01/14/2019 upon evaluation today patient actually appears to be doing better with regard to her wound. She is showing signs of new granulation tissue and I do feel like that this is starting to improve. That is good news. Fortunately there is no evidence of active infection at this point. No fevers, chills, nausea, vomiting, or diarrhea. 12/30-Patient returns at 2 weeks, she does complain of some discomfort at night in the right gluteal area, she is obviously active and stays on her feet and not in a chair during the day. Denies any fevers chills shakes. Complains that the area on the ulcer feels wet fairly often 02/11/2019 on evaluation today patient appears to be doing okay with regard to her wound. She has been tolerating the dressing changes without complication. Fortunately there is no signs of active infection at this time. No fevers, chills, nausea, vomiting, or diarrhea. She does note however that she is having issues financially with having to come  as frequently even as every other week. She states that she is not sure she is going to be able to continue to do so. Fortunately there is no signs of anything doing poorly I think we may be able to spread this out for her in order to help alleviate some of this especially in light of the fact that she does have home health coming out. Objective Constitutional Well-nourished and well-hydrated in no acute distress. Vitals Time Taken: 4:38 PM, Height: 60 in, Weight: 115 lbs, BMI: 22.5, Temperature: 98.2 F, Pulse: 87 bpm, Respiratory Rate: 18 breaths/min, Blood Pressure: 145/79 mmHg. Respiratory normal breathing without difficulty. Psychiatric this patient is able to make decisions and demonstrates good insight into disease process. Alert and Oriented x 3. pleasant and cooperative. General Notes: His wound bed currently showed signs of good granulation in some areas of the wound at this point. She does have some connective tissue/fascia noted in the base of the wound was to try to get this to granulate over. With that being said I think it is good to take some time and I think we may be able to spread out her follow-up visits as such. Integumentary (Hair, Skin) Wound #1 status is Open. Original cause of wound was Gradually Appeared. The wound is located on the Right Gluteus. The wound measures 0.4cm length x 0.6cm width x 1.4cm depth; 0.188cm^2 area and 0.264cm^3 volume. There is Fat Layer (Subcutaneous Tissue) Exposed exposed. There is no tunneling noted, however, there is undermining starting at 12:00 and ending at 3:00 with a maximum distance of 1cm. There is a medium amount of serosanguineous drainage noted. The wound  margin is flat and intact. There is medium (34-66%) pink granulation within the wound bed. There is a medium (34-66%) amount of necrotic tissue within the wound bed including Adherent Slough. 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 Dressing Change Frequency: Wound #1 Right Gluteus: Change dressing three times week. Wound Cleansing: Wound #1 Right Gluteus: Clean wound with Normal Saline. May shower and wash wound with soap and water. - with dressing changes Primary Wound Dressing: Wound #1 Right Gluteus: Silver Collagen - pack into base of tunnel Plain packing strip - behind collagen Secondary Dressing: Wound #1 Right Gluteus: Foam Border - or large bandaid Home Health: [place] skilled nursing for wound care. - [facility]. My suggestion at this point is can be that we discontinue the current measures and switch to a silver collagen dressing into the base of the wound followed by a plain packing strip in order to see if we can help with additional granulation and filling in. Overall the wound is measuring smaller and seems to be doing well I think this may be beneficial for her. 2. I would recommend as well that we continue with a border foam dressing to cover. 3. I recommend as well the patient continue to keep pressure off of the area avoiding sitting for long periods of time. We will see patient back for reevaluation in 1 week here in the clinic. If anything worsens or changes patient will contact our office for additional recommendations. Electronic Signature(s) Signed: 02/11/2019 5:40:39 PM By: Worthy Keeler PA-C Entered By: Worthy Keeler on 02/11/2019 17:40:39 -------------------------------------------------------------------------------- SuperBill Details Patient Name: Date of Service: PRINCESS, LASSILA 02/11/2019 Medical Record [id number] Patient Account Number: [id number] Date of Birth/Sex: Treating RN: [birth year] (86 y.o. 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 I10 Essential (primary) hypertension Facility Procedures CPT4 Code: [id number] Description: 99213 - WOUND CARE VISIT-LEV 3 EST PT Modifier: Quantity: 1 Physician Procedures CPT4 Code Description: [id number] - WC PHYS LEVEL 4 - EST PT ICD-10 Diagnosis Description [id number] Unspecified open wound of right buttock, initial encounte L98.418 Non-pressure chronic ulcer of buttock with other specifie I10 Essential (primary)  hypertension Modifier: 1 r d severity Quantity: Electronic Signature(s) Signed: 02/11/2019 5:42:52 PM By: Worthy Keeler PA-C Entered By: Worthy Keeler on 02/11/2019 17:42:52

## 2019-02-12 NOTE — Progress Notes (Addendum)
Penny Perez, Penny Perez ([id number]) Visit Report for 02/11/2019 Arrival Information Details Patient Name: Date of Service: Penny Perez, Penny Perez 02/11/2019 3:45 PM Medical Record [id number] Patient Account Number: [id number] Date of Birth/Sex: Treating RN: [birth year] (86 y.o. [name] Primary Care Breindel Collier: [name] Other Clinician: Referring Shaqueta Casady: Treating Zamariyah Furukawa/Extender:Stone III, Lemar Livings, Esperanza Richters in Treatment: 10 Visit Information History Since Last Visit Added or deleted any medications: No Patient Arrived: Ambulatory Any new allergies or adverse reactions: No Arrival Time: 16:35 Had a fall or experienced change in No Accompanied By: alone activities of daily living that may affect Transfer Assistance: None risk of falls: Patient Identification Verified: Yes Signs or symptoms of abuse/neglect since last No Secondary Verification Process Yes visito Completed: Hospitalized since last visit: No Patient Requires Transmission-Based No Implantable device outside of the clinic excluding No Precautions: cellular tissue based products placed in the center Patient Has Alerts: No since last visit: Has Dressing in Place as Prescribed: Yes Pain Present Now: No Electronic Signature(s) Signed: 02/12/2019 6:31:26 PM By: Levan Hurst RN, BSN Entered By: Levan Hurst on 02/11/2019 16:37:16 -------------------------------------------------------------------------------- Clinic Level of Care Assessment Details Patient Name: Date of Service: JAMARIE, SEADER 02/11/2019 3:45 PM Medical Record [id number] Patient Account Number: [id number] Date of Birth/Sex: Treating RN: [birth year] (86 y.o. Penny Perez Primary Care Demarion Pondexter: [name] Other Clinician: Referring Amedee Cerrone: Treating Jarrette Dehner/Extender:Stone III, Lemar Livings, Esperanza Richters in Treatment: 10 Clinic Level of Care Assessment Items TOOL 4 Quantity Score []  - Use when only  an EandM is performed on FOLLOW-UP visit 0 ASSESSMENTS - Nursing Assessment / Reassessment X - Reassessment of Co-morbidities (includes updates in patient status) 1 10 X - Reassessment of Adherence to Treatment Plan 1 5 ASSESSMENTS - Wound and Skin Assessment / Reassessment X - Simple Wound Assessment / Reassessment - one wound 1 5 []  - Complex Wound Assessment / Reassessment - multiple wounds 0 []  - Dermatologic / Skin Assessment (not related to wound area) 0 ASSESSMENTS - Focused Assessment []  - Circumferential Edema Measurements - multi extremities 0 []  - Nutritional Assessment / Counseling / Intervention 0 []  - Lower Extremity Assessment (monofilament, tuning fork, pulses) 0 []  - Peripheral Arterial Disease Assessment (using hand held doppler) 0 ASSESSMENTS - Ostomy and/or Continence Assessment and Care []  - Incontinence Assessment and Management 0 []  - Ostomy Care Assessment and Management (repouching, etc.) 0 PROCESS - Coordination of Care X - Simple Patient / Family Education for ongoing care 1 15 []  - Complex (extensive) Patient / Family Education for ongoing care 0 X - Staff obtains Programmer, systems, Records, Test Results / Process Orders 1 10 X - Staff telephones HHA, Nursing Homes / Clarify orders / etc 1 10 []  - Routine Transfer to another Facility (non-emergent condition) 0 []  - Routine Hospital Admission (non-emergent condition) 0 []  - New Admissions / Biomedical engineer / Ordering NPWT, Apligraf, etc. 0 []  - Emergency Hospital Admission (emergent condition) 0 X - Simple Discharge Coordination 1 10 []  - Complex (extensive) Discharge Coordination 0 PROCESS - Special Needs []  - Pediatric / Minor Patient Management 0 []  - Isolation Patient Management 0 []  - Hearing / Language / Visual special needs 0 []  - Assessment of Community assistance (transportation, D/C planning, etc.) 0 []  - Additional assistance / Altered mentation 0 []  - Support Surface(s) Assessment (bed, cushion,  seat, etc.) 0 INTERVENTIONS - Wound Cleansing / Measurement X - Simple Wound Cleansing - one wound 1 5 []  - Complex Wound Cleansing - multiple wounds 0 X - Wound Imaging (photographs -  any number of wounds) 1 5 []  - Wound Tracing (instead of photographs) 0 X - Simple Wound Measurement - one wound 1 5 []  - Complex Wound Measurement - multiple wounds 0 INTERVENTIONS - Wound Dressings X - Small Wound Dressing one or multiple wounds 1 10 []  - Medium Wound Dressing one or multiple wounds 0 []  - Large Wound Dressing one or multiple wounds 0 X - Application of Medications - topical 1 5 []  - Application of Medications - injection 0 INTERVENTIONS - Miscellaneous []  - External ear exam 0 []  - Specimen Collection (cultures, biopsies, blood, body fluids, etc.) 0 []  - Specimen(s) / Culture(s) sent or taken to Lab for analysis 0 []  - Patient Transfer (multiple staff / Civil Service fast streamer / Similar devices) 0 []  - Simple Staple / Suture removal (25 or less) 0 []  - Complex Staple / Suture removal (26 or more) 0 []  - Hypo / Hyperglycemic Management (close monitor of Blood Glucose) 0 []  - Ankle / Brachial Index (ABI) - do not check if billed separately 0 X - Vital Signs 1 5 Has the patient been seen at the hospital within the last three years: Yes Total Score: 100 Level Of Care: New/Established - Level 3 Electronic Signature(s) Signed: 02/11/2019 6:34:17 PM By: Baruch Gouty RN, BSN Entered By: Baruch Gouty on 02/11/2019 17:22:45 -------------------------------------------------------------------------------- Encounter Discharge Information Details Patient Name: Date of Service: Penny Perez, Penny Perez 02/11/2019 3:45 PM Medical Record [id number] Patient Account Number: [id number] Date of Birth/Sex: Treating RN: [birth year] (86 y.o. Penny Perez Primary Care Sujey Gundry: [name] Other Clinician: Referring Marea Reasner: Treating Tommy Minichiello/Extender:Stone III, Lemar Livings, Esperanza Richters in  Treatment: 10 Encounter Discharge Information Items Discharge Condition: Stable Ambulatory Status: Ambulatory Discharge Destination: Home Transportation: Private Auto Accompanied By: self Schedule Follow-up Appointment: Yes Clinical Summary of Care: Patient Declined Electronic Signature(s) Signed: 02/11/2019 6:34:17 PM By: Baruch Gouty RN, BSN Entered By: Baruch Gouty on 02/11/2019 17:31:22 -------------------------------------------------------------------------------- Multi-Disciplinary Care Plan Details Patient Name: Date of Service: Penny Perez, Penny Perez 02/11/2019 3:45 PM Medical Record [id number] Patient Account Number: [id number] Date of Birth/Sex: Treating RN: [birth year] (86 y.o. Penny Perez Primary Care Leasa Kincannon: [name] Other Clinician: Referring Arvel Oquinn: Treating Henning Ehle/Extender:Stone III, Lemar Livings, Esperanza Richters in Treatment: 10 Active Inactive Malignancy/Atypical Etiology Nursing Diagnoses: Knowledge deficit related to disease process and management of atypical ulcer etiology Goals: Patient/caregiver will verbalize understanding of disease process and disease management of atypical ulcer etiology Date Initiated: 12/03/2018 Target Resolution Date: 02/25/2019 Goal Status: Active Interventions: Assess patient and family medical history for signs and symptoms of malignancy/atypical etiology upon admission Provide education on atypical ulcer etiologies Notes: Wound/Skin Impairment Nursing Diagnoses: Impaired tissue integrity Knowledge deficit related to ulceration/compromised skin integrity Goals: Patient/caregiver will verbalize understanding of skin care regimen Date Initiated: 12/03/2018 Target Resolution Date: 02/25/2019 Goal Status: Active Ulcer/skin breakdown will have a volume reduction of 30% by week 4 Date Initiated: 12/03/2018 Date Inactivated: 12/31/2018 Target Resolution Date: 12/31/2018 Unmet Reason: tunnel Goal Status:  Unmet unroofed Ulcer/skin breakdown will have a volume reduction of 50% by week 8 Date Inactivated: 01/28/2019 Target Resolution Date Initiated: 12/31/2018 Date: 01/28/2019 Unmet Reason: multiple Goal Status: Unmet comorbidities Interventions: Assess patient/caregiver ability to obtain necessary supplies Assess patient/caregiver ability to perform ulcer/skin care regimen upon admission and as needed Assess ulceration(s) every visit Treatment Activities: Skin care regimen initiated : 12/03/2018 Topical wound management initiated : 12/03/2018 Notes: Electronic Signature(s) Signed: 02/11/2019 6:34:17 PM By: Baruch Gouty RN, BSN Entered By: Baruch Gouty on 02/11/2019 17:17:45 -------------------------------------------------------------------------------- Pain Assessment Details  Patient Name: Date of Service: Penny Perez, Penny Perez 02/11/2019 3:45 PM Medical Record [id number] Patient Account Number: [id number] Date of Birth/Sex: Treating RN: [birth year] (86 y.o. [name] Primary Care Shadara Lopez: [name] Other Clinician: Referring Nyaire Denbleyker: Treating Mercedez Boule/Extender:Stone III, Lemar Livings, Esperanza Richters in Treatment: 10 Active Problems Location of Pain Severity and Description of Pain Patient Has Paino No Patient Has Paino No Site Locations Pain Management and Medication Current Pain Management: Electronic Signature(s) Signed: 02/12/2019 6:31:26 PM By: Levan Hurst RN, BSN Entered By: Levan Hurst on 02/11/2019 16:37:23 -------------------------------------------------------------------------------- Patient/Caregiver Education Details Patient Name: Date of Service: Penny Perez 1/13/2021andnbsp3:45 PM Medical Record Patient Account Number: [id number] Number: Treating RN: Baruch Gouty Date of Birth/Gender: [birth year] (86 y.o. F) Other Clinician: Primary Care Physician: Earmon Phoenix Referring  Physician: Physician/Extender: Lorelle Gibbs in Treatment: 10 Education Assessment Education Provided To: Patient Education Topics Provided Infection: Methods: Explain/Verbal Responses: Reinforcements needed, State content correctly Wound/Skin Impairment: Methods: Explain/Verbal Responses: Reinforcements needed, State content correctly Electronic Signature(s) Signed: 02/11/2019 6:34:17 PM By: Baruch Gouty RN, BSN Entered By: Baruch Gouty on 02/11/2019 17:18:08 -------------------------------------------------------------------------------- Wound Assessment Details Patient Name: Date of Service: Penny Perez, Penny Perez 02/11/2019 3:45 PM Medical Record [id number] Patient Account Number: [id number] Date of Birth/Sex: Treating RN: [birth year] (86 y.o. [name] Primary Care Jenaye Rickert: [name] Other Clinician: Referring Nnenna Meador: Treating Waldemar Siegel/Extender:Stone III, Lemar Livings, Esperanza Richters in Treatment: 10 Wound Status Wound Number: 1 Primary Atypical Etiology: Wound Location: Right Gluteus Wound Open Wounding Event: Gradually Appeared Status: Date Acquired: 07/30/2018 Comorbid Cataracts, Asthma, Hypertension, Weeks Of Treatment: 10 History: Osteoarthritis Clustered Wound: No Photos Wound Measurements Length: (cm) 0.4 Width: (cm) 0.6 Depth: (cm) 1.4 Area: (cm) 0.188 Volume: (cm) 0.264 % Reduction in Area: 82.4% % Reduction in Volume: 69.1% Epithelialization: Small (1-33%) Tunneling: No Undermining: Yes Starting Position (o'clock): 12 Ending Position (o'clock): 3 Maximum Distance: (cm) 1 Wound Description Classification: Full Thickness Without Exposed Support Foul Od Structures Slough/ Wound Flat and Intact Margin: Exudate Medium Amount: Exudate Serosanguineous Type: Exudate red, brown Color: Wound Bed Granulation Amount: Medium (34-66%) Granulation Quality: Pink Fascia Expo Necrotic Amount: Medium (34-66%) Fat Layer  ( Necrotic Quality: Adherent Slough Tendon Expo Muscle Expo Joint Expos Bone Expose or After Cleansing: No Fibrino Yes Exposed Structure sed: No Subcutaneous Tissue) Exposed: Yes sed: No sed: No ed: No d: No Treatment Notes Wound #1 (Right Gluteus) 3. Primary Dressing Applied Col[employer] Packing strip 4. Secondary Dressing Foam Border Dressing Electronic Signature(s) Signed: 02/16/2019 6:02:32 PM By: Levan Hurst RN, BSN Signed: 02/17/2019 4:37:08 PM By: Mikeal Hawthorne EMT/HBOT Previous Signature: 02/12/2019 6:31:26 PM Version By: Levan Hurst RN, BSN Entered By: Mikeal Hawthorne on 02/13/2019 09:45:04 -------------------------------------------------------------------------------- [place] Details Patient Name: Date of Service: Penny Perez, Penny Perez 02/11/2019 3:45 PM Medical Record [id number] Patient Account Number: [id number] Date of Birth/Sex: Treating RN: Jun 10, 1932 (86 y.o. [name] Primary Care Gustav Knueppel: [name] Other Clinician: Referring Cara Thaxton: Treating Melysa Schroyer/Extender:Stone III, Lemar Livings, Esperanza Richters in Treatment: 10 Vital Signs Time Taken: 16:38 Temperature (F): 98.2 Height (in): 60 Pulse (bpm): 87 Weight (lbs): 115 Respiratory Rate (breaths/min): 18 Body Mass Index (BMI): 22.5 Blood Pressure (mmHg): 145/79 Reference Range: 80 - 120 mg / dl Electronic Signature(s) Signed: 02/12/2019 6:31:26 PM By: Levan Hurst RN, BSN Entered By: Levan Hurst on 02/11/2019 16:39:20

## 2019-03-11 ENCOUNTER — Encounter (HOSPITAL_BASED_OUTPATIENT_CLINIC_OR_DEPARTMENT_OTHER): Payer: Medicare Other | Attending: Physician Assistant | Admitting: Physician Assistant

## 2019-03-11 ENCOUNTER — Other Ambulatory Visit: Payer: Self-pay

## 2019-03-11 ENCOUNTER — Other Ambulatory Visit (HOSPITAL_COMMUNITY)
Admission: RE | Admit: 2019-03-11 | Discharge: 2019-03-11 | Disposition: A | Discharge status: Home / Self Care | Payer: Medicare Other | Source: Other Acute Inpatient Hospital | Attending: Physician Assistant | Admitting: Physician Assistant

## 2019-03-11 DIAGNOSIS — B999 Unspecified infectious disease: Secondary | ICD-10-CM | POA: Diagnosis present

## 2019-03-11 DIAGNOSIS — I10 Essential (primary) hypertension: Secondary | ICD-10-CM | POA: Insufficient documentation

## 2019-03-11 DIAGNOSIS — L98412 Non-pressure chronic ulcer of buttock with fat layer exposed: Secondary | ICD-10-CM | POA: Insufficient documentation

## 2019-03-11 NOTE — Progress Notes (Addendum)
Penny, Perez ([id number]) Visit Report for 03/11/2019 Chief Complaint Document Details Patient Name: Date of Service: Penny, Perez 03/11/2019 3:45 PM Medical Record [id number] Patient Account Number: [id number] Date of Birth/Sex: Treating RN: 11-25-1932 (86 y.o. Penny Perez Primary Care Provider: [name] Other Clinician: Referring Provider: Treating Provider/Extender:Stone III, Lemar Livings, Esperanza Richters in Treatment: 14 Information Obtained from: Patient Chief Complaint Right buttock ulcer Electronic Signature(s) Signed: 03/11/2019 4:10:44 PM By: Worthy Keeler PA-C Entered By: Worthy Keeler on 03/11/2019 16:10:44 -------------------------------------------------------------------------------- Debridement Details Patient Name: Date of Service: Penny Perez 03/11/2019 3:45 PM Medical Record [id number] Patient Account Number: [id number] Date of Birth/Sex: Treating RN: 22-Mar-1932 (86 y.o. Penny Perez Primary Care Provider: [name] Other Clinician: Referring Provider: Treating Provider/Extender:Stone III, Lemar Livings, Esperanza Richters in Treatment: 14 Debridement Performed for Wound #1 Right Gluteus Assessment: Performed By: Physician Worthy Keeler, PA Debridement Type: Debridement Level of Consciousness (Pre- Awake and Alert procedure): Pre-procedure Verification/Time Out Taken: Yes - 16:20 Start Time: 16:21 Pain Control: Other : benzocaine 20% spray Total Area Debrided (L x W): 0.3 (cm) x 0.4 (cm) = 0.12 (cm) Tissue and other material Viable, Non-Viable, Slough, Subcutaneous, Slough debrided: Level: Skin/Subcutaneous Tissue Debridement Description: Excisional Instrument: Curette Bleeding: Minimum Hemostasis Achieved: Pressure End Time: 16:23 Procedural Pain: 6 Post Procedural Pain: 3 Response to Treatment: Procedure was tolerated well Level of Consciousness Awake and Alert (Post-procedure): Post  Debridement Measurements of Total Wound Length: (cm) 0.3 Width: (cm) 0.4 Depth: (cm) 1.2 Volume: (cm) 0.113 Character of Wound/Ulcer Post Improved Debridement: Post Procedure Diagnosis Same as Pre-procedure Electronic Signature(s) Signed: 03/11/2019 5:39:41 PM By: Baruch Gouty RN, BSN Signed: 03/11/2019 6:32:46 PM By: Worthy Keeler PA-C Entered By: Baruch Gouty on 03/11/2019 16:23:09 -------------------------------------------------------------------------------- HPI Details Patient Name: Date of Service: Penny, Perez 03/11/2019 3:45 PM Medical Record [id number] Patient Account Number: [id number] Date of Birth/Sex: Treating RN: [birth year] (86 y.o. Penny Perez Primary Care Provider: [name] Other Clinician: Referring Provider: Treating Provider/Extender:Stone III, Lemar Livings, Esperanza Richters in Treatment: 14 History of Present Illness HPI Description: 12/03/2018 on evaluation today patient appears for initial inspection here in our clinic concerning an issue that she has been having a light having in the right gluteal region with an ulcer which initially she describes as having been a knot which when she went to see the dermatologist was biopsied and this was on 08/26/2018. Subsequently that revealed what appeared to be an inflamed ulcer with granulation tissue. Fortunately there was no signs of active infection based on what they saw on pathology. The patient states that since the biopsy she has been having a lot of pain. Fortunately there is no signs of active infection at this time no fever chills noted. She does have a history of hypertension but really no major medical problems significant otherwise although she does have a lot of allergies including allergies to [facility] as well as antibiotics. In fact she has some ulcers in her mouth secondary to having been prescribed an antibiotic for the current ulcer. She states she has been having  trouble sleeping due to the pain in the ulcer area. 12/10/2018 on evaluation today patient's wound actually showed signs of improvement as far as the loosening up of the necrotic tissue is concerned at this point. I do believe that that has done excellent at this point I think that we need to try and clear away some of the necrotic tissue in the base of the wound I discussed that with the patient  as well. Fortunately she is not having any significant pain and states the Santyl did not burn although in the past she notes that it did cause her some trouble with the ulcer on her leg causing some burning. No fevers, chills, nausea, vomiting, or diarrhea. 12/17/2018 on evaluation today patient appears to be doing well with regard to her wound. This does not appear to be any more deep than it was after the last evaluation and debridement last week. With that being said there are some necrotic tissue on the surface of the wound which is can require some sharp debridement at this point. Fortunately there is no evidence of active infection at this time which is good news. No fevers, chills, nausea, vomiting, or diarrhea. 12/31/2018 on evaluation today patient appears to be doing a little better in regard to the overall appearance of her wound currently. She is tolerating the dressing changes without complication. Fortunately there is no signs of active infection at this time. With that being said she still has some necrotic tissue in the base of the wound that still needs to be removed this is very tough however and I was not able to debrided away last week. She really does not want me to perform any debridement today either due to how bad it hurt after I did this last time. With that being said I think we may be able to switch the dressing to something else to try to help with clearing this out a little better. 01/14/2019 upon evaluation today patient actually appears to be doing better with regard to her  wound. She is showing signs of new granulation tissue and I do feel like that this is starting to improve. That is good news. Fortunately there is no evidence of active infection at this point. No fevers, chills, nausea, vomiting, or diarrhea. 12/30-Patient returns at 2 weeks, she does complain of some discomfort at night in the right gluteal area, she is obviously active and stays on her feet and not in a chair during the day. Denies any fevers chills shakes. Complains that the area on the ulcer feels wet fairly often 02/11/2019 on evaluation today patient appears to be doing okay with regard to her wound. She has been tolerating the dressing changes without complication. Fortunately there is no signs of active infection at this time. No fevers, chills, nausea, vomiting, or diarrhea. She does note however that she is having issues financially with having to come as frequently even as every other week. She states that she is not sure she is going to be able to continue to do so. Fortunately there is no signs of anything doing poorly I think we may be able to spread this out for her in order to help alleviate some of this especially in light of the fact that she does have home health coming out. 03/11/2019 upon evaluation today patient appears to be doing really roughly the same with regard to her wound. This is measuring close to where it was previous although may be slightly smaller. Fortunately there is no evidence of active infection at this time obviously. With that being said this is not healing quite as quickly as I would like to see therefore I may actually perform a repeat culture today to see if there is anything that shows up at this point. Fortunately there is no sign of active infection systemically for certain. The patient is somewhat frustrated with how long this is taking I completely understand your frustration.  Nonetheless I explained that wounds like this often do take a very long  time to heal. Electronic Signature(s) Signed: 03/11/2019 4:33:24 PM By: Worthy Keeler PA-C Entered By: Worthy Keeler on 03/11/2019 16:33:24 -------------------------------------------------------------------------------- Physical Exam Details Patient Name: Date of Service: Penny, Perez 03/11/2019 3:45 PM Medical Record [id number] Patient Account Number: [id number] Date of Birth/Sex: Treating RN: 1932-06-13 (86 y.o. Penny Perez Primary Care Provider: [name] Other Clinician: Referring Provider: Treating Provider/Extender:Stone III, Lemar Livings, Esperanza Richters in Treatment: 72 Constitutional Well-nourished and well-hydrated in no acute distress. Respiratory normal breathing without difficulty. Psychiatric this patient is able to make decisions and demonstrates good insight into disease process. Alert and Oriented x 3. pleasant and cooperative. Notes Patient's wound bed currently showed signs of some necrotic tissue in the base of the wound. I did actually perform sharp debridement to clear away some of the necrotic tissue she tolerated this today without complication and post debridement the wound bed actually appears to be doing better at this point. In fact it appears to be as clean as of seeing it since we began taking care of her initially. She did have some minimal discomfort with debridement today per Electronic Signature(s) Signed: 03/11/2019 4:33:51 PM By: Worthy Keeler PA-C Entered By: Worthy Keeler on 03/11/2019 16:33:51 -------------------------------------------------------------------------------- Physician Orders Details Patient Name: Date of Service: Penny, Perez 03/11/2019 3:45 PM Medical Record [id number] Patient Account Number: [id number] Date of Birth/Sex: Treating RN: February 25, 1932 (86 y.o. 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 Dressing Change Frequency Wound #1 Right Gluteus Change dressing three times week. Wound Cleansing Wound #1 Right Gluteus Clean wound with Normal Saline. May shower and wash wound with soap and water. - with dressing changes Primary Wound Dressing Wound #1 Right Gluteus Hydrofera Blue - classic moistened with saline packed into tunnel Secondary Dressing Wound #1 Right Gluteus Foam Border - or large [facility] skilled nursing for wound care. - [facility] Consults Plastic Surgery - evaluate ulcer on outer ear Laboratory Bacteria identified in Unspecified specimen by Anaerobe culture (MICRO) - right gluteus LOINC Code: [id number] Convenience Name: Anerobic culture Electronic Signature(s) Signed: 03/11/2019 5:39:41 PM By: Baruch Gouty RN, BSN Signed: 03/11/2019 6:32:46 PM By: Worthy Keeler PA-C Entered By: Baruch Gouty on 03/11/2019 17:13:34 -------------------------------------------------------------------------------- Prescription 03/11/2019 Patient Name: Penny Perez Provider: Worthy Keeler PA Date of Birth: Jan 04, 1933 NPI#: [id number] Sex: F DEA#: [id number] Phone #: [phone] License #: Patient Address: [place] [address] [place], [place] [address] [address] [place], [place] [address] [phone] Allergies ampicillin Reaction: fever Severity: Moderate Cipro Reaction: blisters in mouth Severity: Moderate cephalexin Reaction: unknown Severity: Mild clarithromycin Reaction: unknown codeine Reaction:  unknown doxycycline Reaction: unknown erythromycin base Reaction: unknown gatifloxacin Reaction: blisters in mouth hydrocodone Reaction: unknown penicillin Reaction: blisters in mouth Sulfa (Sulfonamide Antibiotics) Reaction: abdominal pain and bloating Provider's Orders Plastic Surgery - evaluate ulcer on outer ear Signature(s): Date(s): Electronic Signature(s) Signed: 03/11/2019 5:39:41 PM By: Baruch Gouty RN, BSN Signed: 03/11/2019 6:32:46 PM By: Worthy Keeler PA-C Entered By: Baruch Gouty on 03/11/2019 17:13:34 --------------------------------------------------------------------------------  Problem List  Details Patient Name: Date of Service: WOODIE, MASHAW 03/11/2019 3:45 PM Medical Record [id number] Patient Account Number: [id number] Date of Birth/Sex: Treating RN: January 22, 1933 (86 y.o. Penny Perez Primary Care Provider: [name] Other Clinician: Referring Provider: Treating Provider/Extender:Stone III, Lemar Livings, Esperanza Richters in Treatment: 14 Active Problems ICD-10 Evaluated Encounter Code Description Active Date Today Diagnosis S31.819A Unspecified open wound of right buttock, initial 12/03/2018 No Yes encounter L98.418 Non-pressure chronic ulcer of buttock with other 12/03/2018 No Yes specified severity I10 Essential (primary) hypertension 12/03/2018 No Yes Inactive Problems Resolved Problems Electronic Signature(s) Signed: 03/11/2019 4:10:39 PM By: Worthy Keeler PA-C Entered By: Worthy Keeler on 03/11/2019 16:10:39 -------------------------------------------------------------------------------- Progress Note Details Patient Name: Date of Service: FLARENCE, EXANTUS 03/11/2019 3:45 PM Medical Record [id number] Patient Account Number: [id number] Date of Birth/Sex: Treating RN: Oct 13, 1932 (86 y.o. Penny Perez Primary Care Provider: [name] Other Clinician: Referring Provider: Treating  Provider/Extender:Stone III, Lemar Livings, Esperanza Richters in Treatment: 14 Subjective Chief Complaint Information obtained from Patient Right buttock ulcer History of Present Illness (HPI) 12/03/2018 on evaluation today patient appears for initial inspection here in our clinic concerning an issue that she has been having a light having in the right gluteal region with an ulcer which initially she describes as having been a knot which when she went to see the dermatologist was biopsied and this was on 08/26/2018. Subsequently that revealed what appeared to be an inflamed ulcer with granulation tissue. Fortunately there was no signs of active infection based on what they saw on pathology. The patient states that since the biopsy she has been having a lot of pain. Fortunately there is no signs of active infection at this time no fever chills noted. She does have a history of hypertension but really no major medical problems significant otherwise although she does have a lot of allergies including allergies to [facility] as well as antibiotics. In fact she has some ulcers in her mouth secondary to having been prescribed an antibiotic for the current ulcer. She states she has been having trouble sleeping due to the pain in the ulcer area. 12/10/2018 on evaluation today patient's wound actually showed signs of improvement as far as the loosening up of the necrotic tissue is concerned at this point. I do believe that that has done excellent at this point I think that we need to try and clear away some of the necrotic tissue in the base of the wound I discussed that with the patient as well. Fortunately she is not having any significant pain and states the Santyl did not burn although in the past she notes that it did cause her some trouble with the ulcer on her leg causing some burning. No fevers, chills, nausea, vomiting, or diarrhea. 12/17/2018 on evaluation today patient appears to be doing well with  regard to her wound. This does not appear to be any more deep than it was after the last evaluation and debridement last week. With that being said there are some necrotic tissue on the surface of the wound which is can require some sharp debridement at this point. Fortunately there is no evidence of active infection at this time which is good news. No fevers, chills, nausea, vomiting, or diarrhea. 12/31/2018 on evaluation today patient appears to be doing a little better in regard to the overall appearance of her wound currently. She is tolerating the dressing changes without complication. Fortunately there is no signs of active infection at this time. With that being said  she still has some necrotic tissue in the base of the wound that still needs to be removed this is very tough however and I was not able to debrided away last week. She really does not want me to perform any debridement today either due to how bad it hurt after I did this last time. With that being said I think we may be able to switch the dressing to something else to try to help with clearing this out a little better. 01/14/2019 upon evaluation today patient actually appears to be doing better with regard to her wound. She is showing signs of new granulation tissue and I do feel like that this is starting to improve. That is good news. Fortunately there is no evidence of active infection at this point. No fevers, chills, nausea, vomiting, or diarrhea. 12/30-Patient returns at 2 weeks, she does complain of some discomfort at night in the right gluteal area, she is obviously active and stays on her feet and not in a chair during the day. Denies any fevers chills shakes. Complains that the area on the ulcer feels wet fairly often 02/11/2019 on evaluation today patient appears to be doing okay with regard to her wound. She has been tolerating the dressing changes without complication. Fortunately there is no signs of active  infection at this time. No fevers, chills, nausea, vomiting, or diarrhea. She does note however that she is having issues financially with having to come as frequently even as every other week. She states that she is not sure she is going to be able to continue to do so. Fortunately there is no signs of anything doing poorly I think we may be able to spread this out for her in order to help alleviate some of this especially in light of the fact that she does have home health coming out. 03/11/2019 upon evaluation today patient appears to be doing really roughly the same with regard to her wound. This is measuring close to where it was previous although may be slightly smaller. Fortunately there is no evidence of active infection at this time obviously. With that being said this is not healing quite as quickly as I would like to see therefore I may actually perform a repeat culture today to see if there is anything that shows up at this point. Fortunately there is no sign of active infection systemically for certain. The patient is somewhat frustrated with how long this is taking I completely understand your frustration. Nonetheless I explained that wounds like this often do take a very long time to heal. Objective Constitutional Well-nourished and well-hydrated in no acute distress. Vitals Time Taken: 2:03 PM, Height: 60 in, Weight: 115 lbs, BMI: 22.5, Temperature: 98.5 F, Pulse: 77 bpm, Respiratory Rate: 18 breaths/min, Blood Pressure: 139/48 mmHg. Respiratory normal breathing without difficulty. Psychiatric this patient is able to make decisions and demonstrates good insight into disease process. Alert and Oriented x 3. pleasant and cooperative. General Notes: Patient's wound bed currently showed signs of some necrotic tissue in the base of the wound. I did actually perform sharp debridement to clear away some of the necrotic tissue she tolerated this today without complication and post  debridement the wound bed actually appears to be doing better at this point. In fact it appears to be as clean as of seeing it since we began taking care of her initially. She did have some minimal discomfort with debridement today per Integumentary (Hair, Skin) Wound #1 status is Open. Original  cause of wound was Gradually Appeared. The wound is located on the Right Gluteus. The wound measures 0.3cm length x 0.4cm width x 1.2cm depth; 0.094cm^2 area and 0.113cm^3 volume. There is Fat Layer (Subcutaneous Tissue) Exposed exposed. There is no tunneling or undermining noted. There is a medium amount of purulent drainage noted. The wound margin is flat and intact. There is large (67-100%) pink granulation within the wound bed. There is a small (1-33%) amount of necrotic tissue within the wound bed including Adherent Slough. 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 There was a Excisional Skin/Subcutaneous Tissue Debridement with a total area of 0.12 sq cm performed by Worthy Keeler, PA. With the following instrument(s): Curette to remove Viable and Non-Viable tissue/material. Material removed includes Subcutaneous Tissue and Slough and after achieving pain control using Other (benzocaine 20% spray). No specimens were taken. A time out was conducted at 16:20, prior to the start of the procedure. A Minimum amount of bleeding was controlled with Pressure. The procedure was tolerated well with a pain level of 6 throughout and a pain level of 3 following the procedure. Post Debridement Measurements: 0.3cm length x 0.4cm width x 1.2cm depth; 0.113cm^3 volume. Character of Wound/Ulcer Post Debridement is improved. Post procedure Diagnosis Wound #1:  Same as Pre-Procedure Plan Follow-up Appointments: Return appointment in 1 month. Dressing Change Frequency: Wound #1 Right Gluteus: Change dressing three times week. Wound Cleansing: Wound #1 Right Gluteus: Clean wound with Normal Saline. May shower and wash wound with soap and water. - with dressing changes Primary Wound Dressing: Wound #1 Right Gluteus: Hydrofera Blue - classic moistened with saline packed into tunnel Secondary Dressing: Wound #1 Right Gluteus: Foam Border - or large bandaid Home Health: [place] skilled nursing for wound care. - [facility] Consults ordered were: Plastic Surgery - evaluate ulcer on outer ear 1. I would recommend at this point that we go ahead and switch to a Hydrofera Blue dressing the hope is that this will do better as far as helping the area to granulate in more effectively and quickly. 2. I would recommend as well that we also continue with the appropriate offloading. She is trying to do that as much as she can at this point. 3. She does have an issue with an ulcer on her right ear she states that the cartilage seems to be affected and she really wants to see a plastic surgeon to see if there is anything that they could recommend for her at this point. With that being said I did discuss with her that I could see about making a referral to a plastic surgeon locally and see if there is anything they would recommend. Nonetheless I do not know how much they are going to be able to do or want to do in regard to this. We will see patient back for reevaluation in 1 Month here in the clinic. If anything worsens or changes patient will contact our office for additional recommendations. Electronic Signature(s) Signed: 03/11/2019 4:35:33 PM By: Worthy Keeler PA-C Entered By: Worthy Keeler on 03/11/2019 16:35:32 -------------------------------------------------------------------------------- SuperBill Details Patient Name: Date of  Service: ARLITA, STINGLE 03/11/2019 Medical Record [id number] Patient Account Number: [id number] Date of Birth/Sex: Treating RN: Apr 13, 1932 (87 y.o. 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

## 2019-03-13 LAB — AEROBIC CULTURE W GRAM STAIN (SUPERFICIAL SPECIMEN)

## 2019-03-13 LAB — AEROBIC CULTURE? (SUPERFICIAL SPECIMEN): Gram Stain: NONE SEEN

## 2019-04-08 ENCOUNTER — Encounter (HOSPITAL_BASED_OUTPATIENT_CLINIC_OR_DEPARTMENT_OTHER): Payer: Medicare Other | Attending: Physician Assistant | Admitting: Physician Assistant

## 2019-04-08 ENCOUNTER — Other Ambulatory Visit: Payer: Self-pay

## 2019-04-08 DIAGNOSIS — Z880 Allergy status to penicillin: Secondary | ICD-10-CM | POA: Insufficient documentation

## 2019-04-08 DIAGNOSIS — S31819A Unspecified open wound of right buttock, initial encounter: Secondary | ICD-10-CM | POA: Insufficient documentation

## 2019-04-08 DIAGNOSIS — X58XXXA Exposure to other specified factors, initial encounter: Secondary | ICD-10-CM | POA: Diagnosis not present

## 2019-04-08 DIAGNOSIS — Z881 Allergy status to other antibiotic agents status: Secondary | ICD-10-CM | POA: Diagnosis not present

## 2019-04-08 DIAGNOSIS — M1990 Unspecified osteoarthritis, unspecified site: Secondary | ICD-10-CM | POA: Insufficient documentation

## 2019-04-08 DIAGNOSIS — I10 Essential (primary) hypertension: Secondary | ICD-10-CM | POA: Insufficient documentation

## 2019-04-08 DIAGNOSIS — Z882 Allergy status to sulfonamides status: Secondary | ICD-10-CM | POA: Diagnosis not present

## 2019-04-08 DIAGNOSIS — Z885 Allergy status to narcotic agent status: Secondary | ICD-10-CM | POA: Insufficient documentation

## 2019-04-08 NOTE — Progress Notes (Addendum)
Penny Perez, Penny Perez ([id number]) Visit Report for 04/08/2019 Arrival Information Details Patient Name: Date of Service: Penny Perez, Penny Perez 04/08/2019 3:45 PM Medical Record [id number] Patient Account Number: [id number] Date of Birth/Sex: Treating RN: July 08, 1932 (86 y.o. [name] Primary Care Marquavion Venhuizen: [name] Other Clinician: Referring Irby Fails: Treating Havier Deeb/Extender:Stone III, Lemar Livings, Esperanza Richters in Treatment: 18 Visit Information History Since Last Visit All ordered tests and consults were completed: No Patient Arrived: Ambulatory Added or deleted any medications: No Arrival Time: 16:13 Any new allergies or adverse reactions: No Accompanied By: self Had a fall or experienced change in No Transfer Assistance: None activities of daily living that may affect Patient Identification Verified: Yes risk of falls: Secondary Verification Process Yes Signs or symptoms of abuse/neglect since last No Completed: visito Patient Requires Transmission-Based No Hospitalized since last visit: No Precautions: Implantable device outside of the clinic excluding No Patient Has Alerts: No cellular tissue based products placed in the center since last visit: Has Dressing in Place as Prescribed: Yes Pain Present Now: No Electronic Signature(s) Signed: 04/08/2019 5:35:23 PM By: Carlene Coria RN Entered By: Carlene Coria on 04/08/2019 16:14:39 -------------------------------------------------------------------------------- Clinic Level of Care Assessment Details Patient Name: Date of Service: Penny Perez, Penny Perez 04/08/2019 3:45 PM Medical Record [id number] Patient Account Number: [id number] Date of Birth/Sex: Treating RN: 1932-10-13 (86 y.o. [name] Primary Care Zahira Brummond: [name] Other Clinician: Referring Katheryne Gorr: Treating Julizza Sassone/Extender:Stone III, Lemar Livings, Esperanza Richters in Treatment: 18 Clinic Level of Care Assessment  Items TOOL 4 Quantity Score []  - Use when only an EandM is performed on FOLLOW-UP visit 0 ASSESSMENTS - Nursing Assessment / Reassessment X - Reassessment of Co-morbidities (includes updates in patient status) 1 10 X - Reassessment of Adherence to Treatment Plan 1 5 ASSESSMENTS - Wound and Skin Assessment / Reassessment X - Simple Wound Assessment / Reassessment - one wound 1 5 []  - Complex Wound Assessment / Reassessment - multiple wounds 0 []  - Dermatologic / Skin Assessment (not related to wound area) 0 ASSESSMENTS - Focused Assessment []  - Circumferential Edema Measurements - multi extremities 0 []  - Nutritional Assessment / Counseling / Intervention 0 []  - Lower Extremity Assessment (monofilament, tuning fork, pulses) 0 []  - Peripheral Arterial Disease Assessment (using hand held doppler) 0 ASSESSMENTS - Ostomy and/or Continence Assessment and Care []  - Incontinence Assessment and Management 0 []  - Ostomy Care Assessment and Management (repouching, etc.) 0 PROCESS - Coordination of Care X - Simple Patient / Family Education for ongoing care 1 15 []  - Complex (extensive) Patient / Family Education for ongoing care 0 X - Staff obtains Programmer, systems, Records, Test Results / Process Orders 1 10 X - Staff telephones HHA, Nursing Homes / Clarify orders / etc 1 10 []  - Routine Transfer to another Facility (non-emergent condition) 0 []  - Routine Hospital Admission (non-emergent condition) 0 []  - New Admissions / Biomedical engineer / Ordering NPWT, Apligraf, etc. 0 []  - Emergency Hospital Admission (emergent condition) 0 X - Simple Discharge Coordination 1 10 []  - Complex (extensive) Discharge Coordination 0 PROCESS - Special Needs []  - Pediatric / Minor Patient Management 0 []  - Isolation Patient Management 0 []  - Hearing / Language / Visual special needs 0 []  - Assessment of Community assistance (transportation, D/C planning, etc.) 0 []  - Additional assistance / Altered mentation  0 []  - Support Surface(s) Assessment (bed, cushion, seat, etc.) 0 INTERVENTIONS - Wound Cleansing / Measurement X - Simple Wound Cleansing - one wound 1 5 []  - Complex Wound Cleansing - multiple  wounds 0 X - Wound Imaging (photographs - any number of wounds) 1 5 []  - Wound Tracing (instead of photographs) 0 X - Simple Wound Measurement - one wound 1 5 []  - Complex Wound Measurement - multiple wounds 0 INTERVENTIONS - Wound Dressings X - Small Wound Dressing one or multiple wounds 1 10 []  - Medium Wound Dressing one or multiple wounds 0 []  - Large Wound Dressing one or multiple wounds 0 X - Application of Medications - topical 1 5 []  - Application of Medications - injection 0 INTERVENTIONS - Miscellaneous []  - External ear exam 0 []  - Specimen Collection (cultures, biopsies, blood, body fluids, etc.) 0 []  - Specimen(s) / Culture(s) sent or taken to Lab for analysis 0 []  - Patient Transfer (multiple staff / Civil Service fast streamer / Similar devices) 0 []  - Simple Staple / Suture removal (25 or less) 0 []  - Complex Staple / Suture removal (26 or more) 0 []  - Hypo / Hyperglycemic Management (close monitor of Blood Glucose) 0 []  - Ankle / Brachial Index (ABI) - do not check if billed separately 0 X - Vital Signs 1 5 Has the patient been seen at the hospital within the last three years: Yes Total Score: 100 Level Of Care: New/Established - Level 3 Electronic Signature(s) Signed: 04/08/2019 5:43:44 PM By: Baruch Gouty RN, BSN Entered By: Baruch Gouty on 04/08/2019 17:01:25 -------------------------------------------------------------------------------- Encounter Discharge Information Details Patient Name: Date of Service: Penny Perez, Penny Perez 04/08/2019 3:45 PM Medical Record [id number] Patient Account Number: [id number] Date of Birth/Sex: Treating RN: Sep 17, 1932 (86 y.o. [name] Primary Care Shreyas Piatkowski: [name] Other Clinician: Referring Quenisha Lovins: Treating  Zakhai Meisinger/Extender:Stone III, Lemar Livings, Esperanza Richters in Treatment: 18 Encounter Discharge Information Items Discharge Condition: Stable Ambulatory Status: Ambulatory Discharge Destination: Home Transportation: Private Auto Accompanied By: self Schedule Follow-up Appointment: Yes Clinical Summary of Care: Patient Declined Electronic Signature(s) Signed: 04/08/2019 5:43:44 PM By: Baruch Gouty RN, BSN Entered By: Baruch Gouty on 04/08/2019 17:12:09 -------------------------------------------------------------------------------- [place] Details Patient Name: Date of Service: Penny Perez, Penny Perez 04/08/2019 3:45 PM Medical Record [id number] Patient Account Number: [id number] Date of Birth/Sex: Treating RN: [birth year] (86 y.o. [name] Primary Care Masato Pettie: [name] Other Clinician: Referring Cambell Stanek: Treating Latoi Giraldo/Extender:Stone III, Lemar Livings, Esperanza Richters in Treatment: 18 Active Inactive Wound/Skin Impairment Nursing Diagnoses: Impaired tissue integrity Knowledge deficit related to ulceration/compromised skin integrity Goals: Patient/caregiver will verbalize understanding of skin care regimen Date Initiated: 12/03/2018 Target Resolution Date: 05/06/2019 Goal Status: Active Ulcer/skin breakdown will have a volume reduction of 30% by week 4 Date Initiated: 12/03/2018 Date Inactivated: 12/31/2018 Target Resolution Date: 12/31/2018 Unmet Reason: tunnel Unmet Reason: tunnel Goal Status: Unmet unroofed Ulcer/skin breakdown will have a volume reduction of 50% by week 8 Date Inactivated: 01/28/2019 Target Resolution Date Initiated: 12/31/2018 Date: 01/28/2019 Unmet Reason: multiple Goal Status: Unmet comorbidities Interventions: Assess patient/caregiver ability to obtain necessary supplies Assess patient/caregiver ability to perform ulcer/skin care regimen upon admission and as needed Assess ulceration(s) every  visit Treatment Activities: Skin care regimen initiated : 12/03/2018 Topical wound management initiated : 12/03/2018 Notes: Electronic Signature(s) Signed: 04/08/2019 5:43:44 PM By: Baruch Gouty RN, BSN Entered By: Baruch Gouty on 04/08/2019 16:56:00 -------------------------------------------------------------------------------- Pain Assessment Details Patient Name: Date of Service: Penny Perez, Penny Perez 04/08/2019 3:45 PM Medical Record [id number] Patient Account Number: [id number] Date of Birth/Sex: Treating RN: 11-12-1932 (86 y.o. [name] Primary Care Flora Parks: [name] Other Clinician: Referring Kimiya Brunelle: Treating Jadan Rouillard/Extender:Stone III, Lemar Livings, Esperanza Richters in Treatment: 18 Active Problems Location of Pain Severity  and Description of Pain Patient Has Paino No Site Locations Pain Management and Medication Current Pain Management: Electronic Signature(s) Signed: 04/08/2019 5:35:23 PM By: Carlene Coria RN Entered By: Carlene Coria on 04/08/2019 16:15:24 -------------------------------------------------------------------------------- Patient/Caregiver Education Details Patient Name: Date of Service: Penny Perez 3/10/2021andnbsp3:45 PM Medical Record Patient Account Number: [id number] Number: Treating RN: Baruch Gouty Date of Birth/Gender: 1932/04/01 (86 y.o. F) Other Clinician: Primary Care Physician: Earmon Phoenix Referring Physician: Physician/Extender: Lorelle Gibbs in Treatment: 18 Education Assessment Education Provided To: Patient Education Topics Provided Infection: Methods: Explain/Verbal Responses: Reinforcements needed, State content correctly Wound/Skin Impairment: Methods: Explain/Verbal Responses: Reinforcements needed, State content correctly Electronic Signature(s) Signed: 04/08/2019 5:43:44 PM By: Baruch Gouty RN, BSN Entered By: Baruch Gouty on 04/08/2019  16:56:26 -------------------------------------------------------------------------------- Wound Assessment Details Patient Name: Date of Service: Penny Perez, Penny Perez 04/08/2019 3:45 PM Medical Record [id number] Patient Account Number: [id number] Date of Birth/Sex: Treating RN: [birth year] (86 y.o. [name] Primary Care Kadian Barcellos: [name] Other Clinician: Referring Oluwatobiloba Martin: Treating Orville Mena/Extender:Stone III, Lemar Livings, Esperanza Richters in Treatment: 18 Wound Status Wound Number: 1 Primary Atypical Etiology: Wound Location: Right Gluteus Wound Open Wounding Event: Gradually Appeared Status: Date Acquired: 07/30/2018 Comorbid Cataracts, Asthma, Hypertension, Weeks Of Treatment: 18 History: Osteoarthritis Clustered Wound: No Photos Wound Measurements Length: (cm) 0.4 Width: (cm) 0.6 Depth: (cm) 1.1 Area: (cm) 0.188 Volume: (cm) 0.207 % Reduction in Area: 82.4% % Reduction in Volume: 75.8% Epithelialization: Small (1-33%) Undermining: Yes Starting Position (o'clock): 10 Ending Position (o'clock): 1 Maximum Distance: (cm) 1.2 Wound Description Full Thickness Without Exposed Support Foul Odo Classification: Structures Slough/F Wound Flat and Intact Margin: Exudate Medium Amount: Exudate Purulent Type: Exudate yellow, brown, green Color: Wound Bed Granulation Amount: Large (67-100%) Granulation Quality: Pink Fascia E Necrotic Amount: Small (1-33%) Fat Laye Necrotic Quality: Adherent Slough Tendon E Muscle E Joint Ex Bone Exp r After Cleansing: No ibrino Yes Exposed Structure xposed: No r (Subcutaneous Tissue) Exposed: Yes xposed: No xposed: No posed: No osed: No Treatment Notes Wound #1 (Right Gluteus) 3. Primary Dressing Applied Calcium Alginate Ag 4. Secondary Dressing Foam Border Dressing Electronic Signature(s) Signed: 04/10/2019 4:47:30 PM By: Mikeal Hawthorne EMT/HBOT Signed: 04/10/2019 5:30:50 PM By: Carlene Coria  RN Previous Signature: 04/08/2019 5:35:23 PM Version By: Carlene Coria RN Entered By: Mikeal Hawthorne on 04/10/2019 15:49:20 -------------------------------------------------------------------------------- Vitals Details Patient Name: Date of Service: Penny Perez, Penny Perez 04/08/2019 3:45 PM Medical Record [id number] Patient Account Number: [id number] Date of Birth/Sex: Treating RN: 05-19-1932 (86 y.o. [name] Primary Care Luiz Trumpower: [name] Other Clinician: Referring Holley Kocurek: Treating Garon Melander/Extender:Stone III, Lemar Livings, Esperanza Richters in Treatment: 18 Vital Signs Time Taken: 16:14 Temperature (F): 98.2 Height (in): 60 Pulse (bpm): 81 Weight (lbs): 115 Respiratory Rate (breaths/min): 18 Body Mass Index (BMI): 22.5 Blood Pressure (mmHg): 132/48 Reference Range: 80 - 120 mg / dl Electronic Signature(s) Signed: 04/08/2019 5:35:23 PM By: Carlene Coria RN Entered By: Carlene Coria on 04/08/2019 16:15:12

## 2019-04-08 NOTE — Progress Notes (Addendum)
ANEVAY, CAMPANELLA ([id number]) Visit Report for 04/08/2019 Chief Complaint Document Details Patient Name: Date of Service: Penny Perez, SPIRITO 04/08/2019 3:45 PM Medical Record [id number] Patient Account Number: [id number] Date of Birth/Sex: Treating RN: [birth year] (86 y.o. [name] Primary Care Provider: [name] Other Clinician: Referring Provider: Treating Provider/Extender:Stone III, Lemar Livings, Esperanza Richters in Treatment: 18 Information Obtained from: Patient Chief Complaint Right buttock ulcer Electronic Signature(s) Signed: 04/08/2019 3:50:04 PM By: Worthy Keeler PA-C Entered By: Worthy Keeler on 04/08/2019 15:50:04 -------------------------------------------------------------------------------- HPI Details Patient Name: Date of Service: Penny Perez, LATCHFORD 04/08/2019 3:45 PM Medical Record [id number] Patient Account Number: [id number] Date of Birth/Sex: Treating RN: 1932-08-07 (86 y.o. [name] Primary Care Provider: [name] Other Clinician: Referring Provider: Treating Provider/Extender:Stone III, Lemar Livings, Esperanza Richters in Treatment: 18 History of Present Illness HPI Description: 12/03/2018 on evaluation today patient appears for initial inspection here in our clinic concerning an issue that she has been having a light having in the right gluteal region with an ulcer which initially she describes as having been a knot which when she went to see the dermatologist was biopsied and this was on 08/26/2018. Subsequently that revealed what appeared to be an inflamed ulcer with granulation tissue. Fortunately there was no signs of active infection based on what they saw on pathology. The patient states that since the biopsy she has been having a lot of pain. Fortunately there is no signs of active infection at this time no fever chills noted. She does have a history of hypertension but really no major medical problems  significant otherwise although she does have a lot of allergies including allergies to [facility] as well as antibiotics. In fact she has some ulcers in her mouth secondary to having been prescribed an antibiotic for the current ulcer. She states she has been having trouble sleeping due to the pain in the ulcer area. 12/10/2018 on evaluation today patient's wound actually showed signs of improvement as far as the loosening up of the necrotic tissue is concerned at this point. I do believe that that has done excellent at this point I think that we need to try and clear away some of the necrotic tissue in the base of the wound I discussed that with the patient as well. Fortunately she is not having any significant pain and states the Santyl did not burn although in the past she notes that it did cause her some trouble with the ulcer on her leg causing some burning. No fevers, chills, nausea, vomiting, or diarrhea. 12/17/2018 on evaluation today patient appears to be doing well with regard to her wound. This does not appear to be any more deep than it was after the last evaluation and debridement last week. With that being said there are some necrotic tissue on the surface of the wound which is can require some sharp debridement at this point. Fortunately there is no evidence of active infection at this time which is good news. No fevers, chills, nausea, vomiting, or diarrhea. 12/31/2018 on evaluation today patient appears to be doing a little better in regard to the overall appearance of her wound currently. She is tolerating the dressing changes without complication. Fortunately there is no signs of active infection at this time. With that being said she still has some necrotic tissue in the base of the wound that still needs to be removed this is very tough however and I was not able to debrided away last week. She really does not want me  to perform any debridement today either due to how bad it  hurt after I did this last time. With that being said I think we may be able to switch the dressing to something else to try to help with clearing this out a little better. 01/14/2019 upon evaluation today patient actually appears to be doing better with regard to her wound. She is showing signs of new granulation tissue and I do feel like that this is starting to improve. That is good news. Fortunately there is no evidence of active infection at this point. No fevers, chills, nausea, vomiting, or diarrhea. 12/30-Patient returns at 2 weeks, she does complain of some discomfort at night in the right gluteal area, she is obviously active and stays on her feet and not in a chair during the day. Denies any fevers chills shakes. Complains that the area on the ulcer feels wet fairly often 02/11/2019 on evaluation today patient appears to be doing okay with regard to her wound. She has been tolerating the dressing changes without complication. Fortunately there is no signs of active infection at this time. No fevers, chills, nausea, vomiting, or diarrhea. She does note however that she is having issues financially with having to come as frequently even as every other week. She states that she is not sure she is going to be able to continue to do so. Fortunately there is no signs of anything doing poorly I think we may be able to spread this out for her in order to help alleviate some of this especially in light of the fact that she does have home health coming out. 03/11/2019 upon evaluation today patient appears to be doing really roughly the same with regard to her wound. This is measuring close to where it was previous although may be slightly smaller. Fortunately there is no evidence of active infection at this time obviously. With that being said this is not healing quite as quickly as I would like to see therefore I may actually perform a repeat culture today to see if there is anything that shows up  at this point. Fortunately there is no sign of active infection systemically for certain. The patient is somewhat frustrated with how long this is taking I completely understand your frustration. Nonetheless I explained that wounds like this often do take a very long time to heal. 04/08/2019 upon evaluation today patient appears to be doing well with regard to her wound. This is not completely healed but does seem to be somewhat better. They have been using silver alginate at this point which is great news and overall the patient does seem to be making some progress. With that being said she did culture positive for MRSA unfortunately she is allergic to pretty much everything. I did want to discuss with her and she did mention today that she be willing to try the linezolid she is never taken this before. Hopefully it will not cause any problems for her. Electronic Signature(s) Signed: 04/08/2019 5:20:37 PM By: Worthy Keeler PA-C Previous Signature: 04/08/2019 4:49:32 PM Version By: Worthy Keeler PA-C Previous Signature: 04/08/2019 4:47:25 PM Version By: Worthy Keeler PA-C Entered By: Worthy Keeler on 04/08/2019 17:20:37 -------------------------------------------------------------------------------- Physical Exam Details Patient Name: Date of Service: Penny Perez, APPELBAUM 04/08/2019 3:45 PM Medical Record [id number] Patient Account Number: [id number] Date of Birth/Sex: Treating RN: 07-24-1932 (86 y.o. [name] Primary Care Provider: [name] Other Clinician: Referring Provider: Treating Provider/Extender:Stone III, Lemar Livings, Esperanza Richters in Treatment:  71 Constitutional Well-nourished and well-hydrated in no acute distress. Respiratory normal breathing without difficulty. Psychiatric this patient is able to make decisions and demonstrates good insight into disease process. Alert and Oriented x 3. pleasant and cooperative. Notes Upon inspection today  patient's wound bed actually showed signs of good granulation at this time in most areas there was some slough noted in the base of the wound but fortunately nothing too significant this is a very small region open at this point which is good news. With that being said she has lot of epithelization down the sidewalls but again this has not completely closed as of yet. Electronic Signature(s) Signed: 04/08/2019 5:21:01 PM By: Worthy Keeler PA-C Previous Signature: 04/08/2019 4:49:53 PM Version By: Worthy Keeler PA-C Previous Signature: 04/08/2019 4:48:04 PM Version By: Worthy Keeler PA-C Entered By: Worthy Keeler on 04/08/2019 17:21:01 -------------------------------------------------------------------------------- Physician Orders Details Patient Name: Date of Service: MAKYNLEE, KRESSIN 04/08/2019 3:45 PM Medical Record [id number] Patient Account Number: [id number] Date of Birth/Sex: Treating RN: 03-28-1932 (86 y.o. 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 Dressing Change Frequency Wound #1 Right Gluteus Change dressing three times week. Wound Cleansing Wound #1 Right Gluteus Clean wound with Normal Saline. May shower and wash wound with soap and water. - with dressing changes Primary Wound Dressing Wound #1 Right Gluteus Calcium Alginate with Silver Secondary Dressing Wound #1 Right Gluteus Foam Border - or large [facility] skilled nursing for wound care. - [facility] Consults Plastic Surgery -  evaluation of atypical, painful lesion right ear Patient Medications Allergies: ampicillin, Cipro, cephalexin, clarithromycin, codeine, doxycycline, erythromycin base, gatifloxacin, hydrocodone, penicillin, Sulfa (Sulfonamide Antibiotics) Notifications Medication Indication Start End linezolid 04/08/2019 DOSE 1 - oral 600 mg tablet - 1 tablet oral taken 2 times a day for 10 days. This is for short term use only gentamicin 04/22/2019 DOSE topical 0.1 % cream - cream topical applied to the wound bed with each dressing change as directed in the clinic Electronic Signature(s) Signed: 04/22/2019 4:58:22 PM By: Worthy Keeler PA-C Previous Signature: 04/08/2019 5:24:05 PM Version By: Worthy Keeler PA-C Entered By: Worthy Keeler on 04/22/2019 16:58:21 -------------------------------------------------------------------------------- Prescription 04/08/2019 Patient Name: [name] Provider: Worthy Keeler PA Date of Birth: 1933-01-09 NPI#: [id number] Sex: F DEA#: [id number] Phone #: [phone] License #: Patient Address: [place] [address] [place], [place] [address] [place], [place] [address] [phone] Allergies ampicillin Reaction: fever Severity: Moderate Cipro Reaction: blisters in mouth Severity: Moderate cephalexin Reaction: unknown Severity: Mild clarithromycin Reaction: unknown codeine Reaction: unknown doxycycline Reaction: unknown erythromycin base Reaction: unknown gatifloxacin Reaction: blisters in mouth hydrocodone Reaction: unknown penicillin Reaction: blisters in mouth Sulfa (Sulfonamide Antibiotics) Reaction: abdominal pain and bloating Provider's Orders Plastic Surgery - evaluation of atypical, painful lesion right ear Signature(s): Date(s): Electronic Signature(s) Signed: 04/29/2019 10:08:08 PM By: Worthy Keeler PA-C Previous Signature: 04/08/2019 5:26:10 PM  Version By: Worthy Keeler PA-C Entered By: Worthy Keeler on 04/22/2019 16:58:23 --------------------------------------------------------------------------------  Problem List Details Patient Name: Date of Service: FLARA, STORTI 04/08/2019 3:45 PM Medical Record [id number] Patient Account  Number: [id number] Date of Birth/Sex: Treating RN: [birth year] (86 y.o. [name] Primary Care Provider: [name] Other Clinician: Referring Provider: Treating Provider/Extender:Stone III, Lemar Livings, Esperanza Richters in Treatment: 18 Active Problems ICD-10 Evaluated Encounter Code Description Active Date Today Diagnosis S31.819A Unspecified open wound of right buttock, initial 12/03/2018 No Yes encounter L98.418 Non-pressure chronic ulcer of buttock with other 12/03/2018 No Yes specified severity I10 Essential (primary) hypertension 12/03/2018 No Yes Inactive Problems Resolved Problems Electronic Signature(s) Signed: 04/08/2019 3:49:58 PM By: Worthy Keeler PA-C Entered By: Worthy Keeler on 04/08/2019 15:49:57 -------------------------------------------------------------------------------- Progress Note Details Patient Name: Date of Service: Penny Perez, TREICHLER 04/08/2019 3:45 PM Medical Record [id number] Patient Account Number: [id number] Date of Birth/Sex: Treating RN: 09-28-1932 (86 y.o. [name] Primary Care Provider: [name] Other Clinician: Referring Provider: Treating Provider/Extender:Stone III, Lemar Livings, Esperanza Richters in Treatment: 18 Subjective Chief Complaint Information obtained from Patient Right buttock ulcer History of Present Illness (HPI) 12/03/2018 on evaluation today patient appears for initial inspection here in our clinic concerning an issue that she has been having a light having in the right gluteal region with an ulcer which initially she describes as having been a knot which when she went to see the  dermatologist was biopsied and this was on 08/26/2018. Subsequently that revealed what appeared to be an inflamed ulcer with granulation tissue. Fortunately there was no signs of active infection based on what they saw on pathology. The patient states that since the biopsy she has been having a lot of pain. Fortunately there is no signs of active infection at this time no fever chills noted. She does have a history of hypertension but really no major medical problems significant otherwise although she does have a lot of allergies including allergies to [facility] as well as antibiotics. In fact she has some ulcers in her mouth secondary to having been prescribed an antibiotic for the current ulcer. She states she has been having trouble sleeping due to the pain in the ulcer area. 12/10/2018 on evaluation today patient's wound actually showed signs of improvement as far as the loosening up of the necrotic tissue is concerned at this point. I do believe that that has done excellent at this point I think that we need to try and clear away some of the necrotic tissue in the base of the wound I discussed that with the patient as well. Fortunately she is not having any significant pain and states the Santyl did not burn although in the past she notes that it did cause her some trouble with the ulcer on her leg causing some burning. No fevers, chills, nausea, vomiting, or diarrhea. 12/17/2018 on evaluation today patient appears to be doing well with regard to her wound. This does not appear to be any more deep than it was after the last evaluation and debridement last week. With that being said there are some necrotic tissue on the surface of the wound which is can require some sharp debridement at this point. Fortunately there is no evidence of active infection at this time which is good news. No fevers, chills, nausea, vomiting, or diarrhea. 12/31/2018 on evaluation today patient appears to be doing a  little better in regard to the overall appearance of her wound currently. She is tolerating the dressing changes without complication. Fortunately there is no signs of active infection at this time. With that being said she still has some necrotic tissue in the base of the wound that still needs to  be removed this is very tough however and I was not able to debrided away last week. She really does not want me to perform any debridement today either due to how bad it hurt after I did this last time. With that being said I think we may be able to switch the dressing to something else to try to help with clearing this out a little better. 01/14/2019 upon evaluation today patient actually appears to be doing better with regard to her wound. She is showing signs of new granulation tissue and I do feel like that this is starting to improve. That is good news. Fortunately there is no evidence of active infection at this point. No fevers, chills, nausea, vomiting, or diarrhea. 12/30-Patient returns at 2 weeks, she does complain of some discomfort at night in the right gluteal area, she is obviously active and stays on her feet and not in a chair during the day. Denies any fevers chills shakes. Complains that the area on the ulcer feels wet fairly often 02/11/2019 on evaluation today patient appears to be doing okay with regard to her wound. She has been tolerating the dressing changes without complication. Fortunately there is no signs of active infection at this time. No fevers, chills, nausea, vomiting, or diarrhea. She does note however that she is having issues financially with having to come as frequently even as every other week. She states that she is not sure she is going to be able to continue to do so. Fortunately there is no signs of anything doing poorly I think we may be able to spread this out for her in order to help alleviate some of this especially in light of the fact that she does have  home health coming out. 03/11/2019 upon evaluation today patient appears to be doing really roughly the same with regard to her wound. This is measuring close to where it was previous although may be slightly smaller. Fortunately there is no evidence of active infection at this time obviously. With that being said this is not healing quite as quickly as I would like to see therefore I may actually perform a repeat culture today to see if there is anything that shows up at this point. Fortunately there is no sign of active infection systemically for certain. The patient is somewhat frustrated with how long this is taking I completely understand your frustration. Nonetheless I explained that wounds like this often do take a very long time to heal. 04/08/2019 upon evaluation today patient appears to be doing well with regard to her wound. This is not completely healed but does seem to be somewhat better. They have been using silver alginate at this point which is great news and overall the patient does seem to be making some progress. With that being said she did culture positive for MRSA unfortunately she is allergic to pretty much everything. I did want to discuss with her and she did mention today that she be willing to try the linezolid she is never taken this before. Hopefully it will not cause any problems for her. Objective Constitutional Well-nourished and well-hydrated in no acute distress. Vitals Time Taken: 4:14 PM, Height: 60 in, Weight: 115 lbs, BMI: 22.5, Temperature: 98.2 F, Pulse: 81 bpm, Respiratory Rate: 18 breaths/min, Blood Pressure: 132/48 mmHg. Respiratory normal breathing without difficulty. Psychiatric this patient is able to make decisions and demonstrates good insight into disease process. Alert and Oriented x 3. pleasant and cooperative. General Notes: Upon inspection today  patient's wound bed actually showed signs of good granulation at this time in most areas there  was some slough noted in the base of the wound but fortunately nothing too significant this is a very small region open at this point which is good news. With that being said she has lot of epithelization down the sidewalls but again this has not completely closed as of yet. Integumentary (Hair, Skin) Wound #1 status is Open. Original cause of wound was Gradually Appeared. The wound is located on the Right Gluteus. The wound measures 0.4cm length x 0.6cm width x 1.1cm depth; 0.188cm^2 area and 0.207cm^3 volume. There is Fat Layer (Subcutaneous Tissue) Exposed exposed. There is undermining starting at 10:00 and ending at 1:00 with a maximum distance of 1.2cm. There is a medium amount of purulent drainage noted. The wound margin is flat and intact. There is large (67-100%) pink granulation within the wound bed. There is a small (1-33%) amount of necrotic tissue within the wound bed including Adherent Slough. 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 Dressing Change Frequency: Wound #1 Right Gluteus: Change dressing three times week. Wound Cleansing: Wound #1 Right Gluteus: Clean wound with Normal Saline. May shower and wash wound with soap and water. - with dressing changes Primary Wound Dressing: Wound #1 Right Gluteus: Calcium Alginate with Silver Secondary Dressing: Wound #1 Right Gluteus: Foam Border - or large bandaid Home Health: [facility] skilled nursing for wound care. - [facility] Consults ordered were: Plastic Surgery - evaluation of atypical, painful lesion right ear The following medication(s) was prescribed: linezolid oral 600 mg tablet 1 1 tablet oral taken 2 times a day for 10 days. This is for short term use only starting 04/08/2019 gentamicin  topical 0.1 % cream cream topical applied to the wound bed with each dressing change as directed in the clinic starting 04/22/2019 1. I would recommend currently that we go ahead and see about initiating linezolid for the patient as this is really the only option to treat the MRSA that we have at this point. There is some interaction with her current medications but at the same time this is the only option and I think for short-term 10-day or less years we will be okay as far as the MAO inhibition. 2. I am also going to recommend at this point that the patient continue with the silver alginate dressing as this seems to be doing well I think that is an appropriate thing to do at this point. 3. We will go ahead and see about making the referral to plastic surgery apparently the patient is already seen dermatology and they stated she would need to see plastic surgery to have this area managed. 4. We will also continue with the bordered foam dressing at home health coming out to change her dressings. With that being said apparently there is some issues with her insurance pain for [facility] as they may be out of network. We will see patient back for reevaluation in 1 week here in the clinic. If anything worsens or changes patient will contact our office for additional recommendations. Electronic Signature(s) Signed: 04/22/2019 5:14:02 PM By: Worthy Keeler PA-C Previous Signature: 04/08/2019 5:25:43 PM Version By: Worthy Keeler PA-C Previous Signature: 04/08/2019 4:48:41 PM Version By: Worthy Keeler PA-C Entered By: Worthy Keeler on 04/22/2019 17:14:02 -------------------------------------------------------------------------------- SuperBill  Details Patient Name: Date of Service: FAIZAH, KANDLER 04/08/2019 Medical Record [id number] Patient Account Number: [id number] Date of Birth/Sex: Treating RN: [birth year] (86 y.o. 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

## 2019-05-04 ENCOUNTER — Inpatient Hospital Stay (HOSPITAL_BASED_OUTPATIENT_CLINIC_OR_DEPARTMENT_OTHER)
Admission: EM | Admit: 2019-05-04 | Discharge: 2019-05-08 | DRG: 683 | Disposition: A | Discharge status: Home Health | Payer: Medicare Other | Attending: Internal Medicine | Admitting: Internal Medicine

## 2019-05-04 ENCOUNTER — Emergency Department (HOSPITAL_BASED_OUTPATIENT_CLINIC_OR_DEPARTMENT_OTHER): Payer: Medicare Other

## 2019-05-04 ENCOUNTER — Encounter (HOSPITAL_BASED_OUTPATIENT_CLINIC_OR_DEPARTMENT_OTHER): Payer: Self-pay | Admitting: *Deleted

## 2019-05-04 ENCOUNTER — Other Ambulatory Visit: Payer: Self-pay

## 2019-05-04 DIAGNOSIS — G8929 Other chronic pain: Secondary | ICD-10-CM | POA: Diagnosis present

## 2019-05-04 DIAGNOSIS — E860 Dehydration: Secondary | ICD-10-CM | POA: Diagnosis present

## 2019-05-04 DIAGNOSIS — N1832 Chronic kidney disease, stage 3b: Secondary | ICD-10-CM | POA: Diagnosis present

## 2019-05-04 DIAGNOSIS — Z981 Arthrodesis status: Secondary | ICD-10-CM

## 2019-05-04 DIAGNOSIS — J45909 Unspecified asthma, uncomplicated: Secondary | ICD-10-CM | POA: Diagnosis present

## 2019-05-04 DIAGNOSIS — N179 Acute kidney failure, unspecified: Secondary | ICD-10-CM | POA: Diagnosis present

## 2019-05-04 DIAGNOSIS — M1990 Unspecified osteoarthritis, unspecified site: Secondary | ICD-10-CM | POA: Diagnosis present

## 2019-05-04 DIAGNOSIS — N170 Acute kidney failure with tubular necrosis: Secondary | ICD-10-CM | POA: Diagnosis not present

## 2019-05-04 DIAGNOSIS — M542 Cervicalgia: Secondary | ICD-10-CM | POA: Diagnosis not present

## 2019-05-04 DIAGNOSIS — Z881 Allergy status to other antibiotic agents status: Secondary | ICD-10-CM | POA: Diagnosis not present

## 2019-05-04 DIAGNOSIS — R64 Cachexia: Secondary | ICD-10-CM | POA: Diagnosis present

## 2019-05-04 DIAGNOSIS — N189 Chronic kidney disease, unspecified: Secondary | ICD-10-CM | POA: Diagnosis present

## 2019-05-04 DIAGNOSIS — Z7982 Long term (current) use of aspirin: Secondary | ICD-10-CM

## 2019-05-04 DIAGNOSIS — M4184 Other forms of scoliosis, thoracic region: Secondary | ICD-10-CM | POA: Diagnosis present

## 2019-05-04 DIAGNOSIS — Z880 Allergy status to penicillin: Secondary | ICD-10-CM

## 2019-05-04 DIAGNOSIS — Z90722 Acquired absence of ovaries, bilateral: Secondary | ICD-10-CM

## 2019-05-04 DIAGNOSIS — R52 Pain, unspecified: Secondary | ICD-10-CM

## 2019-05-04 DIAGNOSIS — I129 Hypertensive chronic kidney disease with stage 1 through stage 4 chronic kidney disease, or unspecified chronic kidney disease: Secondary | ICD-10-CM | POA: Diagnosis present

## 2019-05-04 DIAGNOSIS — I10 Essential (primary) hypertension: Secondary | ICD-10-CM | POA: Diagnosis present

## 2019-05-04 DIAGNOSIS — E039 Hypothyroidism, unspecified: Secondary | ICD-10-CM | POA: Diagnosis present

## 2019-05-04 DIAGNOSIS — N183 Chronic kidney disease, stage 3 unspecified: Secondary | ICD-10-CM | POA: Diagnosis not present

## 2019-05-04 DIAGNOSIS — E871 Hypo-osmolality and hyponatremia: Secondary | ICD-10-CM | POA: Diagnosis not present

## 2019-05-04 DIAGNOSIS — M5030 Other cervical disc degeneration, unspecified cervical region: Secondary | ICD-10-CM | POA: Diagnosis present

## 2019-05-04 DIAGNOSIS — M797 Fibromyalgia: Secondary | ICD-10-CM | POA: Diagnosis present

## 2019-05-04 DIAGNOSIS — Z90710 Acquired absence of both cervix and uterus: Secondary | ICD-10-CM

## 2019-05-04 DIAGNOSIS — Z20822 Contact with and (suspected) exposure to covid-19: Secondary | ICD-10-CM | POA: Diagnosis present

## 2019-05-04 DIAGNOSIS — K219 Gastro-esophageal reflux disease without esophagitis: Secondary | ICD-10-CM | POA: Diagnosis present

## 2019-05-04 DIAGNOSIS — N181 Chronic kidney disease, stage 1: Secondary | ICD-10-CM | POA: Diagnosis not present

## 2019-05-04 DIAGNOSIS — Z79890 Hormone replacement therapy (postmenopausal): Secondary | ICD-10-CM

## 2019-05-04 DIAGNOSIS — M62838 Other muscle spasm: Secondary | ICD-10-CM | POA: Diagnosis present

## 2019-05-04 DIAGNOSIS — Z79891 Long term (current) use of opiate analgesic: Secondary | ICD-10-CM

## 2019-05-04 DIAGNOSIS — F419 Anxiety disorder, unspecified: Secondary | ICD-10-CM | POA: Diagnosis present

## 2019-05-04 DIAGNOSIS — Z79899 Other long term (current) drug therapy: Secondary | ICD-10-CM

## 2019-05-04 DIAGNOSIS — M419 Scoliosis, unspecified: Secondary | ICD-10-CM | POA: Diagnosis present

## 2019-05-04 DIAGNOSIS — Z96643 Presence of artificial hip joint, bilateral: Secondary | ICD-10-CM | POA: Diagnosis present

## 2019-05-04 DIAGNOSIS — M4134 Thoracogenic scoliosis, thoracic region: Secondary | ICD-10-CM | POA: Diagnosis not present

## 2019-05-04 DIAGNOSIS — Z8041 Family history of malignant neoplasm of ovary: Secondary | ICD-10-CM

## 2019-05-04 DIAGNOSIS — Z885 Allergy status to narcotic agent status: Secondary | ICD-10-CM

## 2019-05-04 DIAGNOSIS — M545 Low back pain: Secondary | ICD-10-CM | POA: Diagnosis present

## 2019-05-04 DIAGNOSIS — E872 Acidosis: Secondary | ICD-10-CM | POA: Diagnosis present

## 2019-05-04 DIAGNOSIS — Z800 Family history of malignant neoplasm of digestive organs: Secondary | ICD-10-CM

## 2019-05-04 DIAGNOSIS — G894 Chronic pain syndrome: Secondary | ICD-10-CM | POA: Diagnosis present

## 2019-05-04 DIAGNOSIS — Z7951 Long term (current) use of inhaled steroids: Secondary | ICD-10-CM

## 2019-05-04 DIAGNOSIS — Z6824 Body mass index (BMI) 24.0-24.9, adult: Secondary | ICD-10-CM

## 2019-05-04 DIAGNOSIS — Z888 Allergy status to other drugs, medicaments and biological substances status: Secondary | ICD-10-CM

## 2019-05-04 DIAGNOSIS — K439 Ventral hernia without obstruction or gangrene: Secondary | ICD-10-CM | POA: Diagnosis present

## 2019-05-04 DIAGNOSIS — A0472 Enterocolitis due to Clostridium difficile, not specified as recurrent: Secondary | ICD-10-CM | POA: Diagnosis present

## 2019-05-04 DIAGNOSIS — Z825 Family history of asthma and other chronic lower respiratory diseases: Secondary | ICD-10-CM

## 2019-05-04 HISTORY — DX: Scoliosis, unspecified: M41.9

## 2019-05-04 LAB — BASIC METABOLIC PANEL
Anion gap: 13 (ref 5–15)
BUN: 80 mg/dL — ABNORMAL HIGH (ref 8–23)
CO2: 14 mmol/L — ABNORMAL LOW (ref 22–32)
Calcium: 8.7 mg/dL — ABNORMAL LOW (ref 8.9–10.3)
Chloride: 94 mmol/L — ABNORMAL LOW (ref 98–111)
Creatinine, Ser: 4.63 mg/dL — ABNORMAL HIGH (ref 0.44–1.00)
GFR calc Af Amer: 9 mL/min — ABNORMAL LOW (ref >60)
GFR calc non Af Amer: 8 mL/min — ABNORMAL LOW (ref >60)
Glucose, Bld: 135 mg/dL — ABNORMAL HIGH (ref 70–99)
Potassium: 3.7 mmol/L (ref 3.5–5.1)
Sodium: 121 mmol/L — ABNORMAL LOW (ref 135–145)

## 2019-05-04 LAB — HEPATIC FUNCTION PANEL
ALT: 22 U/L (ref 0–44)
AST: 28 U/L (ref 15–41)
Albumin: 3.4 g/dL — ABNORMAL LOW (ref 3.5–5.0)
Alkaline Phosphatase: 91 U/L (ref 38–126)
Bilirubin, Direct: <0.1 mg/dL (ref 0.0–0.2)
Total Bilirubin: 0.5 mg/dL (ref 0.3–1.2)
Total Protein: 7.1 g/dL (ref 6.5–8.1)

## 2019-05-04 LAB — CBC WITH DIFFERENTIAL/PLATELET
Abs Immature Granulocytes: 0.08 10*3/uL — ABNORMAL HIGH (ref 0.00–0.07)
Basophils Absolute: 0 10*3/uL (ref 0.0–0.1)
Basophils Relative: 0 %
Eosinophils Absolute: 0.2 10*3/uL (ref 0.0–0.5)
Eosinophils Relative: 2 %
HCT: 32.1 % — ABNORMAL LOW (ref 36.0–46.0)
Hemoglobin: 10.4 g/dL — ABNORMAL LOW (ref 12.0–15.0)
Immature Granulocytes: 1 %
Lymphocytes Relative: 11 %
Lymphs Abs: 1.3 10*3/uL (ref 0.7–4.0)
MCH: 27.8 pg (ref 26.0–34.0)
MCHC: 32.4 g/dL (ref 30.0–36.0)
MCV: 85.8 fL (ref 80.0–100.0)
Monocytes Absolute: 1.2 10*3/uL — ABNORMAL HIGH (ref 0.1–1.0)
Monocytes Relative: 10 %
Neutro Abs: 9.2 10*3/uL — ABNORMAL HIGH (ref 1.7–7.7)
Neutrophils Relative %: 76 %
Platelets: 294 10*3/uL (ref 150–400)
RBC: 3.74 MIL/uL — ABNORMAL LOW (ref 3.87–5.11)
RDW: 14.7 % (ref 11.5–15.5)
WBC: 12 10*3/uL — ABNORMAL HIGH (ref 4.0–10.5)
nRBC: 0 % (ref 0.0–0.2)

## 2019-05-04 LAB — LIPASE, BLOOD: Lipase: 72 U/L — ABNORMAL HIGH (ref 11–51)

## 2019-05-04 LAB — TROPONIN I (HIGH SENSITIVITY): Troponin I (High Sensitivity): 9 ng/L (ref <18)

## 2019-05-04 MED ORDER — FENTANYL CITRATE (PF) 100 MCG/2ML IJ SOLN
50.0000 ug | Freq: Once | INTRAMUSCULAR | Status: AC
Start: 2019-05-04 — End: 2019-05-04
  Administered 2019-05-04: 50 ug via INTRAVENOUS
  Filled 2019-05-04: qty 2

## 2019-05-04 MED ORDER — STERILE WATER FOR INJECTION IV SOLN
INTRAVENOUS | Status: AC
  Filled 2019-05-04 (×2): qty 850

## 2019-05-04 MED ORDER — SODIUM CHLORIDE 0.9 % IV BOLUS
500.0000 mL | Freq: Once | INTRAVENOUS | Status: AC
  Administered 2019-05-04: 500 mL via INTRAVENOUS

## 2019-05-04 MED ORDER — SODIUM BICARBONATE 8.4 % IV SOLN
INTRAVENOUS | Status: AC
  Filled 2019-05-04: qty 150

## 2019-05-04 NOTE — ED Provider Notes (Signed)
[facility] EMERGENCY DEPARTMENT Provider Note   CSN: [id number] Arrival date & time: 05/04/19  1622     History Chief Complaint  Patient presents with  . Back Pain  . Abdominal Pain    Penny Perez is a 86 y.o. female.  Patient with history of fibromyalgia, asthma who presents to the ED with upper back pain, lower abdominal pain.  Patient on chronic narcotics for chronic pain for her low back.  However upper back pain is new.  She denies any trauma.  Denies any urinary symptoms.  Has had some right lower abdominal pain also.  Possibly some constipation.  Has a chronic wound to her right buttock area as well.  Denies any specific chest pain but family concerned about possible heart issue.  The history is provided by the patient and a caregiver.  Abdominal Pain Pain location:  RLQ and suprapubic Pain quality: aching   Pain radiates to:  Does not radiate Pain severity:  Mild Onset quality:  Gradual Duration:  4 days Timing:  Intermittent Progression:  Waxing and waning Relieved by: Narcotics. Worsened by:  Movement Associated symptoms: no chest pain, no chills, no cough, no dysuria, no fever, no hematuria, no shortness of breath, no sore throat and no vomiting   Risk factors: being elderly        Past Medical History:  Diagnosis Date  . ALLERGIC RHINITIS   . Asthma   . Bronchitis   . DDD (degenerative disc disease), cervical   . Esophageal reflux   . Fibromyalgia   . Osteoarthritis   . Unspecified essential hypertension     Patient Active Problem List   Diagnosis Date Noted  . Acute renal failure superimposed on stage 3 chronic kidney disease ([place]) 05/04/2019  . Hormone replacement therapy (HRT) 03/22/2014  . HYPERTENSION 12/07/2009  . Acute bronchitis 12/07/2009  . ALLERGIC RHINITIS 12/07/2009  . OSTEOARTHRITIS, MULTIPLE JOINTS 12/07/2009  . CAROTID BRUIT, LEFT 12/07/2009    Past Surgical History:  Procedure Laterality Date  . APPENDECTOMY      . BACK SURGERY  2013   lumb fusion  . COLONOSCOPY    . ERCP    . ORIF TOE FRACTURE  02/27/2012   Procedure: OPEN REDUCTION INTERNAL FIXATION (ORIF) METATARSAL (TOE) FRACTURE;  Surgeon: Colin Rhein, MD;  Location: [facility];  Service: Orthopedics;  Laterality: Right;  RIGHT OPEN REDUCTION INTERNAL FIXATION NON UNION 1ST METATARSAL NECK,FLEXOR HALLUCIS LONGUS TO PROXIMAL PHALANAX TENDON TRANSFER,  GASTROC SLIDE,  LOCAL BONE GRAFT   . SHOULDER ARTHROSCOPY  2011   rt  . TENDON TRANSFER  02/27/2012   Procedure: TENDON TRANSFER;  Surgeon: Colin Rhein, MD;  Location: [facility];  Service: Orthopedics;  Laterality: Right;  . TOTAL ABDOMINAL HYSTERECTOMY    . TOTAL ABDOMINAL HYSTERECTOMY W/ BILATERAL SALPINGOOPHORECTOMY    . TOTAL HIP ARTHROPLASTY  03,09   rt and lt  . TOTAL HIP REVISION       OB History   No obstetric history on file.     Family History  Problem Relation Age of Onset  . Asthma Mother   . Ovarian cancer Mother   . Pancreatic cancer Father     Social History   Tobacco Use  . Smoking status: Never Smoker  . Smokeless tobacco: Never Used  Substance Use Topics  . Alcohol use: No  . Drug use: No    Home Medications Prior to Admission medications   Medication Sig Start Date  End Date Taking? Authorizing Provider  albuterol (PROAIR HFA) 108 (90 BASE) MCG/ACT inhaler 2 puffs every 4 hours if needed- rescue inhaler 11/01/11 05/05/15  Baird Lyons D, MD  aspirin 81 MG tablet Take 81 mg by mouth.    [provider]  azelastine (ASTELIN) 0.1 % nasal spray USE 1 SPRAY IN EACH NOSTRIL TWICE DAILY AS DIRECTED 06/01/13   [provider]  budesonide-formoterol (SYMBICORT) 80-4.5 MCG/ACT inhaler Inhale 2 puffs into the lungs 2 (two) times daily. Rinse mouth 11/01/11 03/22/14  Baird Lyons D, MD  Cholecalciferol (VITAMIN D3) 1000 UNITS CAPS Take 1 capsule by mouth daily.      [provider]  conjugated estrogens  (PREMARIN) vaginal cream USE AS DIRECTED 05/05/15   Dena Billet B, PA-C  cromolyn (NASALCROM) 5.2 MG/ACT nasal spray 1 spray by Nasal route 4 (four) times daily as needed.     [provider]  diazepam (VALIUM) 10 MG tablet Take 1 tablet by mouth as needed. 09/04/12   [provider]  estradiol (VIVELLE-DOT) 0.05 MG/24HR patch APPLY 1 PATCH EXTERNALLY TO THE SKIN EVERY 72 HOURS 04/10/16   Dixon, Stanton Kidney B, PA-C  fish oil-omega-3 fatty acids 1000 MG capsule Take 1 g by mouth daily.      [provider]  Flaxseed, Linseed, (FLAX SEED OIL) 1000 MG CAPS Take by mouth.      [provider]  fluticasone (FLONASE) 50 MCG/ACT nasal spray USE 1 SPRAY IN EACH NOSTRIL EVERY DAY AS DIRECTED 01/29/13   [provider]  furosemide (LASIX) 40 MG tablet Take 1 tablet by mouth Once daily as needed. 10/29/11   [provider]  gabapentin (NEURONTIN) 300 MG capsule Take 1 capsule by mouth Three times daily as needed.    [provider]  hyoscyamine (LEVSIN, ANASPAZ) 0.125 MG tablet TAKE 1 TABLET BY MOUTH EVERY 6 HOURS AS NEEDED FOR CRAMPING 08/12/17   [provider]  imipramine (TOFRANIL) 50 MG tablet Take 1 tablet by mouth daily. 10/29/11   [provider]  KLOR-CON M10 10 MEQ tablet TAKE 1 TABLET BY MOUTH EVERY DAY *PT PREFERS KLOR-CON* 09/17/17   [provider]  levothyroxine (SYNTHROID, LEVOTHROID) 100 MCG tablet Take 100 mcg by mouth every other day.    [provider]  levothyroxine (SYNTHROID, LEVOTHROID) 112 MCG tablet Take 112 mcg by mouth every other day.     [provider]  LINZESS 290 MCG CAPS capsule TAKE ONE CAPSULE (290 MCG TOTAL) BY MOUTH DAILY. 10/02/17   [provider]  loratadine (CLARITIN) 10 MG tablet Take 10 mg by mouth daily.      [provider]  methocarbamol (ROBAXIN) 500 MG tablet Take 500 mg by mouth. 11/25/12   [provider]  montelukast (SINGULAIR) 10 MG tablet Take  10 mg by mouth at bedtime.      [provider]  MOVANTIK 25 MG TABS tablet TAKE 1 TABLET BY MOUTH DAILY FOR 30 DAYS. 09/03/17   [provider]  nystatin (MYCOSTATIN) powder Apply topically 2 (two) times daily. To effected areas 12/01/14   Orlena Sheldon, PA-C  olmesartan-hydrochlorothiazide (BENICAR HCT) 20-12.5 MG per tablet 1 tablet. 1/2 tablet once daily    [provider]  omeprazole (PRILOSEC) 40 MG capsule TAKE 1 CAPSULE BY MOUTH TWICE DAILY 01/21/14   [provider]  Oxycodone HCl 10 MG TABS TAKE 1 TABLET (10 MG TOTAL) BY MOUTH EVERY SIX (6) HOURS AS NEEDED FOR PAIN. 09/23/17  [provider]  oxyCODONE-acetaminophen (PERCOCET) 10-325 MG tablet  10/25/17   [provider]  potassium chloride (KLOR-CON) 10 MEQ CR tablet Take 10 mEq by mouth daily.      [provider]  Spacer/Aero-Holding Chambers (AEROCHAMBER MV) inhaler by Other route. Use as instructed     [provider]    Allergies    Ampicillin, Cephalexin, Clarithromycin, Codeine, Doxycycline, Erythromycin, Gatifloxacin, Hydrocodone, and Penicillins  Review of Systems   Review of Systems  Constitutional: Negative for chills and fever.  HENT: Negative for ear pain and sore throat.   Eyes: Negative for pain and visual disturbance.  Respiratory: Negative for cough and shortness of breath.   Cardiovascular: Negative for chest pain and palpitations.  Gastrointestinal: Positive for abdominal pain. Negative for vomiting.  Genitourinary: Negative for dysuria and hematuria.  Musculoskeletal: Positive for back pain. Negative for arthralgias.  Skin: Positive for wound. Negative for color change and rash.  Neurological: Negative for dizziness, seizures, syncope and facial asymmetry.  All other systems reviewed and are negative.   Physical Exam Updated Vital Signs BP 126/62 (BP Location: Right Arm)   Pulse 84   Temp 98.4 F (36.9 C) (Oral)   Resp 13   Ht 4\' 11"   (1.499 m)   Wt 52.2 kg   SpO2 (!) 86%   BMI 23.23 kg/m   Physical Exam Vitals and nursing note reviewed.  Constitutional:      General: She is not in acute distress.    Appearance: She is well-developed.  HENT:     Head: Normocephalic and atraumatic.  Eyes:     Extraocular Movements: Extraocular movements intact.     Conjunctiva/sclera: Conjunctivae normal.     Pupils: Pupils are equal, round, and reactive to light.  Cardiovascular:     Rate and Rhythm: Normal rate and regular rhythm.     Heart sounds: Normal heart sounds. No murmur.  Pulmonary:     Effort: Pulmonary effort is normal. No respiratory distress.     Breath sounds: Normal breath sounds.  Abdominal:     General: Bowel sounds are normal.     Palpations: Abdomen is soft.     Tenderness: There is abdominal tenderness in the right lower quadrant and suprapubic area. There is no right CVA tenderness or guarding. Negative signs include Murphy's sign.     Hernia: A hernia is present. Hernia is present in the umbilical area (reducible ventral hernia).  Musculoskeletal:     Cervical back: Neck supple.     Comments: Tenderness to paraspinal muscles of the upper back, some tenderness over the bony portions of upper back  Skin:    General: Skin is warm and dry.     Comments: Wound to right buttocks with no purulent drainage, no erythema  Neurological:     General: No focal deficit present.     Mental Status: She is alert.     Cranial Nerves: No cranial nerve deficit.     Motor: No weakness.     ED Results / Procedures / Treatments   Labs (all labs ordered are listed, but only abnormal results are displayed) Labs Reviewed  CBC WITH DIFFERENTIAL/PLATELET - Abnormal; Notable for the following components:      Result Value   WBC 12.0 (*)    RBC 3.74 (*)    Hemoglobin 10.4 (*)    HCT 32.1 (*)    Neutro Abs 9.2 (*)    Monocytes Absolute 1.2 (*)    Abs Immature  Granulocytes 0.08 (*)    All other components within  normal limits  BASIC METABOLIC PANEL - Abnormal; Notable for the following components:   Sodium 121 (*)    Chloride 94 (*)    CO2 14 (*)    Glucose, Bld 135 (*)    BUN 80 (*)    Creatinine, Ser 4.63 (*)    Calcium 8.7 (*)    GFR calc non Af Amer 8 (*)    GFR calc Af Amer 9 (*)    All other components within normal limits  HEPATIC FUNCTION PANEL - Abnormal; Notable for the following components:   Albumin 3.4 (*)    All other components within normal limits  LIPASE, BLOOD - Abnormal; Notable for the following components:   Lipase 72 (*)    All other components within normal limits  SARS CORONAVIRUS 2 (TAT 6-24 HRS)  URINALYSIS, ROUTINE W REFLEX MICROSCOPIC  SODIUM, URINE, RANDOM  CREATININE, URINE, RANDOM  UREA NITROGEN, URINE  TROPONIN I (HIGH SENSITIVITY)    EKG EKG Interpretation  Date/Time:  Monday May 04 2019 16:59:17 EDT Ventricular Rate:  87 PR Interval:    QRS Duration: 97 QT Interval:  360 QTC Calculation: 433 R Axis:   -30 Text Interpretation: Sinus rhythm Atrial premature complex Left axis deviation Low voltage, precordial leads Confirmed by Lennice Sites [phone]) on 05/04/2019 5:03:04 PM   Radiology CT ABDOMEN PELVIS WO CONTRAST  Result Date: 05/04/2019 CLINICAL DATA:  86 year old female with abdominal distension. EXAM: CT ABDOMEN AND PELVIS WITHOUT CONTRAST TECHNIQUE: Multidetector CT imaging of the abdomen and pelvis was performed following the standard protocol without IV contrast. COMPARISON:  CT abdomen pelvis dated 03/20/2017. FINDINGS: Evaluation of this exam is limited in the absence of intravenous contrast. Evaluation is also limited due to respiratory motion artifact as well as streak artifact caused by patient's arms and metallic hip replacement. Lower chest: Bibasilar linear atelectasis/scarring. There is coronary vascular calcification. No intra-abdominal free air or free fluid. Hepatobiliary: The liver is grossly unremarkable. The gallbladder is poorly  visualized. A small calcific focus in the region of the porta pedis as seen previously and may represent a small focus of vascular calcification or a small gallstone. Ultrasound may provide better evaluation if there is clinical concern for acute cholecystitis. Pancreas: The pancreas is poorly visualized. No definite active inflammatory changes. Spleen: Normal in size without focal abnormality. Adrenals/Urinary Tract: The adrenal glands are suboptimally visualized. There is no hydronephrosis or nephrolithiasis on either side. There is mild parenchyma atrophy and cortical irregularity/scarring. The urinary bladder is grossly unremarkable. Stomach/Bowel: There is loose stool throughout the colon compatible with diarrheal state. Correlation with clinical exam and stool cultures recommended. Mild thickened appearance of the colon concerning for colitis. No bowel obstruction. The appendix is not visualized with certainty. No inflammatory changes identified in the right lower quadrant. Vascular/Lymphatic: Advanced aortoiliac atherosclerotic disease. The IVC is grossly unremarkable. No definite adenopathy. Reproductive: Hysterectomy. Other: Small fat containing umbilical hernia. Musculoskeletal: Osteopenia with degenerative changes of the spine, scoliosis, and lower lumbar posterior fusion hardware. Bilateral hip replacements. No acute osseous pathology. IMPRESSION: 1. Diarrheal state with findings of colitis. Correlation with clinical exam and stool cultures recommended. No bowel obstruction. 2. No hydronephrosis or nephrolithiasis. 3. Aortic Atherosclerosis (ICD10-I70.0). Electronically Signed   By: Anner Crete M.D.   On: 05/04/2019 18:03   DG Chest 2 View  Result Date: 05/04/2019 CLINICAL DATA:  86 year old female with fever and pain. EXAM: CHEST - 2 VIEW COMPARISON:  Chest radiograph dated 12/16/2013. FINDINGS: No focal consolidation, pleural effusion, or pneumothorax. The cardiac silhouette is within normal  limits. Atherosclerotic calcification of the aorta. The aorta is tortuous. There is osteopenia with degenerative changes of the spine and scoliosis. No definite acute osseous pathology. Partially visualized lumbar fusion hardware and right shoulder arthroplasty. IMPRESSION: No active cardiopulmonary disease. Electronically Signed   By: Anner Crete M.D.   On: 05/04/2019 18:33   DG Thoracic Spine 2 View  Result Date: 05/04/2019 CLINICAL DATA:  Pain EXAM: THORACIC SPINE 2 VIEWS COMPARISON:  None. FINDINGS: Severe thoracolumbar scoliosis. Advanced diffuse degenerative disc disease. No acute bony abnormality. No fracture. IMPRESSION: Severe scoliosis and advanced degenerative changes. No acute findings. Electronically Signed   By: Rolm Baptise M.D.   On: 05/04/2019 18:33    Procedures .Critical Care Performed by: Lennice Sites, DO Authorized by: Lennice Sites, DO   Critical care provider statement:    Critical care time (minutes):  45   Critical care was necessary to treat or prevent imminent or life-threatening deterioration of the following conditions:  Dehydration and renal failure   Critical care was time spent personally by me on the following activities:  Blood draw for specimens, development of treatment plan with patient or surrogate, discussions with primary provider, evaluation of patient's response to treatment, examination of patient, obtaining history from patient or surrogate, ordering and performing treatments and interventions, ordering and review of laboratory studies, pulse oximetry, re-evaluation of patient's condition, ordering and review of radiographic studies and review of old charts   I assumed direction of critical care for this patient from another provider in my specialty: no     (including critical care time)  Medications Ordered in ED Medications  sodium bicarbonate 150 mEq in sterile water 1,000 mL infusion (has no administration in time range)  fentaNYL  (SUBLIMAZE) injection 50 mcg (50 mcg Intravenous Given 05/04/19 1717)  sodium chloride 0.9 % bolus 500 mL (0 mLs Intravenous Stopped 05/04/19 1900)    ED Course  I have reviewed the triage vital signs and the nursing notes.  Pertinent labs & imaging results that were available during my care of the patient were reviewed by me and considered in my medical decision making (see chart for details).    MDM Rules/Calculators/A&P                      Penny Perez is an 86 year old female with history of arthritis, fibromyalgia who presents to the ED with abdominal pain, back pain, generalized weakness.  Patient with unremarkable vitals.  No fever.  Patient with upper back pain for the last several days on and off.  History of chronic low back pain.  Takes chronic narcotics.  Denies any fall.  Denies any chest pain.  However family concern for cardiac issue.  No history of heart disease.  We will get troponin EKG.  Also with lower abdominal pain.  Has a history of hernia but is easily reducible on exam.  Does admit to some constipation.  Has a chronic right buttock wound that overall looks well.  Does not appear infectious.  We will get a CT scan abdomen and pelvis as well as blood work to evaluate for intra-abdominal process such as bowel obstruction, constipation, urinary tract infection.  Has had her appendix removed in the past.  Will give IV fentanyl, IV fluids.  Will reevaluate.  Will get chest x-ray as well.  Patient with sodium of 121, creatinine of 4.63.  Otherwise no significant anemia.  Bicarb is 14.  However anion gap is normal.  BUN elevated to 80.  Likely these findings are in the setting of dehydration as patient has had poor p.o. intake.  CT scan of the abdomen pelvis is overall unremarkable.  Possible colitis however patient denies any diarrhea.  Otherwise believe patient has AKI and hyponatremia in the setting of dehydration.  Covid test is pending.  Chest x-ray back x-ray unremarkable.   Patient to be admitted to medicine for further care.  This chart was dictated using voice recognition software.  Despite best efforts to proofread,  errors can occur which can change the documentation meaning.   Final Clinical Impression(s) / ED Diagnoses Final diagnoses:  AKI (acute kidney injury) ([place])  Hyponatremia    Rx / DC Orders ED Discharge Orders    None       Lennice Sites, DO 05/04/19 1959

## 2019-05-04 NOTE — ED Notes (Signed)
Call daughter Adelene Amas for updates @ [phone]

## 2019-05-04 NOTE — ED Notes (Signed)
Per Rollen Sox @ [facility], able to mix sodium bicarb with 5% dextrose.   Sterile water not available.

## 2019-05-04 NOTE — ED Notes (Signed)
Pt unable to urinate, had watery BM on bedside toilet.

## 2019-05-04 NOTE — ED Notes (Signed)
ED Provider at bedside. 

## 2019-05-04 NOTE — ED Triage Notes (Signed)
Chronic pain due to arthritis. Abdominal pain, no appetite. Lethargy and confusion per daughter. Pt states she had a fever last week Tuesday and Wednesday. She has a wound on her right hip due to biopsy in August  that never healed.

## 2019-05-04 NOTE — ED Notes (Signed)
Patient transported to X-ray 

## 2019-05-05 ENCOUNTER — Encounter (HOSPITAL_BASED_OUTPATIENT_CLINIC_OR_DEPARTMENT_OTHER): Payer: Self-pay | Admitting: Family Medicine

## 2019-05-05 ENCOUNTER — Inpatient Hospital Stay (HOSPITAL_COMMUNITY): Payer: Medicare Other

## 2019-05-05 DIAGNOSIS — M419 Scoliosis, unspecified: Secondary | ICD-10-CM | POA: Diagnosis present

## 2019-05-05 DIAGNOSIS — M4134 Thoracogenic scoliosis, thoracic region: Secondary | ICD-10-CM

## 2019-05-05 DIAGNOSIS — M542 Cervicalgia: Secondary | ICD-10-CM

## 2019-05-05 DIAGNOSIS — I10 Essential (primary) hypertension: Secondary | ICD-10-CM | POA: Diagnosis present

## 2019-05-05 DIAGNOSIS — N1832 Chronic kidney disease, stage 3b: Secondary | ICD-10-CM

## 2019-05-05 DIAGNOSIS — A0472 Enterocolitis due to Clostridium difficile, not specified as recurrent: Secondary | ICD-10-CM | POA: Diagnosis present

## 2019-05-05 DIAGNOSIS — F419 Anxiety disorder, unspecified: Secondary | ICD-10-CM | POA: Diagnosis present

## 2019-05-05 DIAGNOSIS — M62838 Other muscle spasm: Secondary | ICD-10-CM

## 2019-05-05 DIAGNOSIS — N189 Chronic kidney disease, unspecified: Secondary | ICD-10-CM | POA: Diagnosis present

## 2019-05-05 DIAGNOSIS — N179 Acute kidney failure, unspecified: Secondary | ICD-10-CM | POA: Diagnosis present

## 2019-05-05 LAB — GASTROINTESTINAL PANEL BY PCR, STOOL (REPLACES STOOL CULTURE)

## 2019-05-05 LAB — BASIC METABOLIC PANEL
Anion gap: 13 (ref 5–15)
BUN: 72 mg/dL — ABNORMAL HIGH (ref 8–23)
CO2: 20 mmol/L — ABNORMAL LOW (ref 22–32)
Calcium: 8.3 mg/dL — ABNORMAL LOW (ref 8.9–10.3)
Chloride: 95 mmol/L — ABNORMAL LOW (ref 98–111)
Creatinine, Ser: 3.15 mg/dL — ABNORMAL HIGH (ref 0.44–1.00)
GFR calc Af Amer: 15 mL/min — ABNORMAL LOW (ref >60)
GFR calc non Af Amer: 13 mL/min — ABNORMAL LOW (ref >60)
Glucose, Bld: 113 mg/dL — ABNORMAL HIGH (ref 70–99)
Potassium: 3.4 mmol/L — ABNORMAL LOW (ref 3.5–5.1)
Sodium: 128 mmol/L — ABNORMAL LOW (ref 135–145)

## 2019-05-05 LAB — COMPREHENSIVE METABOLIC PANEL
ALT: 18 U/L (ref 0–44)
AST: 21 U/L (ref 15–41)
Albumin: 2.9 g/dL — ABNORMAL LOW (ref 3.5–5.0)
Alkaline Phosphatase: 89 U/L (ref 38–126)
Anion gap: 11 (ref 5–15)
BUN: 66 mg/dL — ABNORMAL HIGH (ref 8–23)
CO2: 18 mmol/L — ABNORMAL LOW (ref 22–32)
Calcium: 8.3 mg/dL — ABNORMAL LOW (ref 8.9–10.3)
Chloride: 99 mmol/L (ref 98–111)
Creatinine, Ser: 2.52 mg/dL — ABNORMAL HIGH (ref 0.44–1.00)
GFR calc Af Amer: 20 mL/min — ABNORMAL LOW (ref >60)
GFR calc non Af Amer: 17 mL/min — ABNORMAL LOW (ref >60)
Glucose, Bld: 103 mg/dL — ABNORMAL HIGH (ref 70–99)
Potassium: 3.3 mmol/L — ABNORMAL LOW (ref 3.5–5.1)
Sodium: 128 mmol/L — ABNORMAL LOW (ref 135–145)
Total Bilirubin: 0.6 mg/dL (ref 0.3–1.2)
Total Protein: 6.3 g/dL — ABNORMAL LOW (ref 6.5–8.1)

## 2019-05-05 LAB — SARS CORONAVIRUS 2 (TAT 6-24 HRS): SARS Coronavirus 2: NEGATIVE

## 2019-05-05 LAB — CBC WITH DIFFERENTIAL/PLATELET
Abs Immature Granulocytes: 0.18 10*3/uL — ABNORMAL HIGH (ref 0.00–0.07)
Basophils Absolute: 0.1 10*3/uL (ref 0.0–0.1)
Basophils Relative: 0 %
Eosinophils Absolute: 0.3 10*3/uL (ref 0.0–0.5)
Eosinophils Relative: 2 %
HCT: 33.4 % — ABNORMAL LOW (ref 36.0–46.0)
Hemoglobin: 10.9 g/dL — ABNORMAL LOW (ref 12.0–15.0)
Immature Granulocytes: 1 %
Lymphocytes Relative: 7 %
Lymphs Abs: 1.3 10*3/uL (ref 0.7–4.0)
MCH: 27.5 pg (ref 26.0–34.0)
MCHC: 32.6 g/dL (ref 30.0–36.0)
MCV: 84.3 fL (ref 80.0–100.0)
Monocytes Absolute: 1 10*3/uL (ref 0.1–1.0)
Monocytes Relative: 5 %
Neutro Abs: 15.9 10*3/uL — ABNORMAL HIGH (ref 1.7–7.7)
Neutrophils Relative %: 85 %
Platelets: 333 10*3/uL (ref 150–400)
RBC: 3.96 MIL/uL (ref 3.87–5.11)
RDW: 14.6 % (ref 11.5–15.5)
WBC: 18.7 10*3/uL — ABNORMAL HIGH (ref 4.0–10.5)
nRBC: 0 % (ref 0.0–0.2)

## 2019-05-05 LAB — C DIFFICILE QUICK SCREEN W PCR REFLEX
C Diff antigen: POSITIVE — AB
C Diff interpretation: DETECTED
C Diff toxin: POSITIVE — AB

## 2019-05-05 LAB — MAGNESIUM: Magnesium: 1.7 mg/dL (ref 1.7–2.4)

## 2019-05-05 LAB — PHOSPHORUS: Phosphorus: 3.3 mg/dL (ref 2.5–4.6)

## 2019-05-05 MED ORDER — ENSURE ENLIVE PO LIQD
237.0000 mL | Freq: Two times a day (BID) | ORAL | Status: DC
  Administered 2019-05-06 (×2): 237 mL via ORAL

## 2019-05-05 MED ORDER — MOMETASONE FURO-FORMOTEROL FUM 100-5 MCG/ACT IN AERO
2.0000 | INHALATION_SPRAY | Freq: Two times a day (BID) | RESPIRATORY_TRACT | Status: DC
  Administered 2019-05-05 – 2019-05-07 (×5): 2 via RESPIRATORY_TRACT
  Filled 2019-05-05: qty 8.8

## 2019-05-05 MED ORDER — SODIUM CHLORIDE 0.9 % IV SOLN: INTRAVENOUS | Status: DC

## 2019-05-05 MED ORDER — LEVOTHYROXINE SODIUM 100 MCG PO TABS
100.0000 ug | ORAL_TABLET | ORAL | Status: DC
  Administered 2019-05-05 – 2019-05-07 (×2): 100 ug via ORAL
  Filled 2019-05-05: qty 1

## 2019-05-05 MED ORDER — OXYCODONE-ACETAMINOPHEN 10-325 MG PO TABS: 1.0000 | ORAL_TABLET | Freq: Four times a day (QID) | ORAL | Status: DC | PRN

## 2019-05-05 MED ORDER — PNEUMOCOCCAL VAC POLYVALENT 25 MCG/0.5ML IJ INJ
0.5000 mL | INJECTION | INTRAMUSCULAR | Status: DC
  Filled 2019-05-05: qty 0.5

## 2019-05-05 MED ORDER — ALBUTEROL SULFATE HFA 108 (90 BASE) MCG/ACT IN AERS: 2.0000 | INHALATION_SPRAY | RESPIRATORY_TRACT | Status: DC | PRN

## 2019-05-05 MED ORDER — METHOCARBAMOL 500 MG PO TABS: 500.0000 mg | ORAL_TABLET | Freq: Three times a day (TID) | ORAL | Status: DC | PRN

## 2019-05-05 MED ORDER — DIAZEPAM 5 MG PO TABS
10.0000 mg | ORAL_TABLET | Freq: Two times a day (BID) | ORAL | Status: DC | PRN
  Administered 2019-05-08: 10 mg via ORAL
  Filled 2019-05-05: qty 2

## 2019-05-05 MED ORDER — ALBUTEROL SULFATE (2.5 MG/3ML) 0.083% IN NEBU: 2.5000 mg | INHALATION_SOLUTION | Freq: Four times a day (QID) | RESPIRATORY_TRACT | Status: DC | PRN

## 2019-05-05 MED ORDER — GABAPENTIN 300 MG PO CAPS
300.0000 mg | ORAL_CAPSULE | Freq: Three times a day (TID) | ORAL | Status: DC | PRN
  Administered 2019-05-05 – 2019-05-08 (×2): 300 mg via ORAL
  Filled 2019-05-05 (×2): qty 1

## 2019-05-05 MED ORDER — FENTANYL CITRATE (PF) 100 MCG/2ML IJ SOLN
50.0000 ug | Freq: Once | INTRAMUSCULAR | Status: AC
  Administered 2019-05-05: 50 ug via INTRAVENOUS
  Filled 2019-05-05: qty 2

## 2019-05-05 MED ORDER — ENOXAPARIN SODIUM 40 MG/0.4ML ~~LOC~~ SOLN: 30.0000 mg | SUBCUTANEOUS | Status: DC

## 2019-05-05 MED ORDER — ASPIRIN EC 81 MG PO TBEC
81.0000 mg | DELAYED_RELEASE_TABLET | Freq: Every day | ORAL | Status: DC
  Administered 2019-05-05 – 2019-05-08 (×4): 81 mg via ORAL
  Filled 2019-05-05 (×4): qty 1

## 2019-05-05 MED ORDER — OXYCODONE HCL 5 MG PO TABS
5.0000 mg | ORAL_TABLET | Freq: Four times a day (QID) | ORAL | Status: DC | PRN
  Administered 2019-05-07 – 2019-05-08 (×4): 5 mg via ORAL
  Filled 2019-05-05 (×5): qty 1

## 2019-05-05 MED ORDER — VANCOMYCIN 50 MG/ML ORAL SOLUTION
125.0000 mg | Freq: Four times a day (QID) | ORAL | Status: DC
  Administered 2019-05-05 – 2019-05-08 (×12): 125 mg via ORAL
  Filled 2019-05-05 (×16): qty 2.5

## 2019-05-05 MED ORDER — OXYCODONE-ACETAMINOPHEN 5-325 MG PO TABS
1.0000 | ORAL_TABLET | Freq: Four times a day (QID) | ORAL | Status: DC | PRN
  Administered 2019-05-05 – 2019-05-08 (×8): 1 via ORAL
  Filled 2019-05-05 (×9): qty 1

## 2019-05-05 MED ORDER — HEPARIN SODIUM (PORCINE) 5000 UNIT/ML IJ SOLN
5000.0000 [IU] | Freq: Three times a day (TID) | INTRAMUSCULAR | Status: DC
  Administered 2019-05-05 – 2019-05-08 (×8): 5000 [IU] via SUBCUTANEOUS
  Filled 2019-05-05 (×6): qty 1

## 2019-05-05 MED ORDER — SODIUM CHLORIDE 0.9 % IV SOLN: Freq: Once | INTRAVENOUS | Status: AC

## 2019-05-05 NOTE — ED Notes (Signed)
Pt in and out for 800 cc urine spec to lab dr aware

## 2019-05-05 NOTE — ED Provider Notes (Signed)
Brief update note  86 year old lady notable history fibromyalgia, asthma presenting to ER with abdominal pain, back pain.  Work-up concerning for new AKI, hyponatremia.  Suspected related to hyponatremia.  Patient admitted to medicine, Dr. Myna Hidalgo excepting.  Given 1 L D5 with 177meq sodium bicarb.  Overnight, waiting for bed in ER. No urine output.  Loose stool noted this am.   Plan:  Will send GI pathogen panel Repeat BMP, will start new MIVF pending results Bladder scan Hold home olmesartan/HCTZ for now, placed order for home levothyroxine, prn gabapentin and prn percocet for chronic pain/fibro   Penny Starch, MD 05/05/19 [phone]

## 2019-05-05 NOTE — Progress Notes (Signed)
RN contacted Admissions to notify of patient arrival to unit.

## 2019-05-05 NOTE — ED Notes (Signed)
Dr Roslynn Amble aware of Na of 128

## 2019-05-05 NOTE — Progress Notes (Signed)
[facility] to [facility] transfer:  70 yof with hx of HTN, hypothyroidism, asthma, CKD IIIb, and chronic pain presenting to [facility] with loss of appetite, increased pain, and general malaise. Daughter reports pt had fever last week and has been a little confused. She is found to have creatinine 4.6 (1.5 in November 2020) with BUN 80, bicarb 14, sodium 121, and normal potassium. She is having watery diarrhea at [facility] and CT abd/pelvis findings consistent with colitis but no hydronephrosis. Appears dry per Dr. Ronnald Nian.   She was given a 500 cc saline bolus, starting on isotonic bicarb infusion now, and covid PCR pending.   Labs are improving this AM with Na++ up to 128; creatinine down to 3.15.  Stool is positive for C. Diff.  She appears to have AKI on CKD associated with C diff colitis.  Will request C diff management per pharmacy and admit to [facility].   Carlyon Shadow, M.D.

## 2019-05-05 NOTE — H&P (Signed)
[facility] History and Physical  Penny Perez AST:[id number] DOB: August 19, 1932 DOA: 05/04/2019  Referring physician:  PCP: Chesley Noon, MD   Chief Complaint: Chronic pain  Penny Perez is a 86 y.o. WF PMHx Fibromyalgia, Chronic pain syndrome, asthma, DDD C-spine, essential HTN CKD stage IIIb (baseline Cr 1.42 on 02/25/2012).    Presents Daughter reports pt had fever last week and has been a little confused. She is found to have creatinine 4.6 (1.5 in November 2020) with BUN 80, bicarb 14, sodium 121, and normal potassium. She is having watery diarrhea at [facility] and CT abd/pelvis findings consistent with colitis but no hydronephrosis. Appears dry per Dr. Ronnald Nian.   She was given a 500 cc saline bolus, starting on isotonic bicarb infusion now, and covid PCR pending.   Labs are improving this AM with Na++ up to 128; creatinine down to 3.15.  Stool is positive for C. Diff.  She appears to have AKI on CKD associated with C diff colitis.  Will request C diff management per pharmacy and admit to [facility]  -Patient states C-spine pain rated 9/10, positive lower back pain.  States gets L-spine injections but not C-spine injections.   Review of Systems:  Constitutional:  No weight loss, night sweats, Fevers, chills, fatigue.  HEENT:  No headaches, Difficulty swallowing,Tooth/dental problems,Sore throat,  No sneezing, itching, ear ache, nasal congestion, post nasal drip,  Cardio-vascular:  No chest pain, Orthopnea, PND, swelling in lower extremities, anasarca, dizziness, palpitations  GI:  No heartburn, indigestion, positive abdominal pain, nausea, vomiting, positive diarrhea, positive change in bowel habits, loss of appetite  Resp:  No shortness of breath with exertion or at rest. No excess mucus, no productive cough, No non-productive cough, No coughing up of blood.No change in color of mucus.No wheezing.No chest wall deformity  Skin:  no rash or lesions.  GU:  no  dysuria, change in color of urine, no urgency or frequency. No flank pain.  Musculoskeletal:   positive joint pain or swelling. No decreased range of motion. positive back pain.  Psych:  No change in mood or affect. No depression or anxiety. No memory loss.   Past Medical History:  Diagnosis Date  . ALLERGIC RHINITIS   . Asthma   . Bronchitis   . DDD (degenerative disc disease), cervical   . Esophageal reflux   . Fibromyalgia   . Osteoarthritis   . Scoliosis   . Unspecified essential hypertension    Past Surgical History:  Procedure Laterality Date  . APPENDECTOMY    . BACK SURGERY  2013   lumb fusion  . COLONOSCOPY    . ERCP    . ORIF TOE FRACTURE  02/27/2012   Procedure: OPEN REDUCTION INTERNAL FIXATION (ORIF) METATARSAL (TOE) FRACTURE;  Surgeon: Colin Rhein, MD;  Location: [place];  Service: Orthopedics;  Laterality: Right;  RIGHT OPEN REDUCTION INTERNAL FIXATION NON UNION 1ST METATARSAL NECK,FLEXOR HALLUCIS LONGUS TO PROXIMAL PHALANAX TENDON TRANSFER,  GASTROC SLIDE,  LOCAL BONE GRAFT   . SHOULDER ARTHROSCOPY  2011   rt  . TENDON TRANSFER  02/27/2012   Procedure: TENDON TRANSFER;  Surgeon: Colin Rhein, MD;  Location: [place];  Service: Orthopedics;  Laterality: Right;  . TOTAL ABDOMINAL HYSTERECTOMY    . TOTAL ABDOMINAL HYSTERECTOMY W/ BILATERAL SALPINGOOPHORECTOMY    . TOTAL HIP ARTHROPLASTY  03,09   rt and lt  . TOTAL HIP REVISION     Social History:  reports that she has never smoked.  She has never used smokeless tobacco. She reports that she does not drink alcohol or use drugs.  Allergies  Allergen Reactions  . Ampicillin   . Cephalexin   . Clarithromycin   . Codeine   . Doxycycline   . Erythromycin   . Gatifloxacin   . Hydrocodone   . Penicillins     Family History  Problem Relation Age of Onset  . Asthma Mother   . Ovarian cancer Mother   . Pancreatic cancer Father      Prior to Admission medications     Medication Sig Start Date End Date Taking? Authorizing Provider  albuterol (PROAIR HFA) 108 (90 BASE) MCG/ACT inhaler 2 puffs every 4 hours if needed- rescue inhaler 11/01/11 05/05/15  Baird Lyons D, MD  aspirin 81 MG tablet Take 81 mg by mouth.    [provider]  azelastine (ASTELIN) 0.1 % nasal spray USE 1 SPRAY IN EACH NOSTRIL TWICE DAILY AS DIRECTED 06/01/13   [provider]  budesonide-formoterol (SYMBICORT) 80-4.5 MCG/ACT inhaler Inhale 2 puffs into the lungs 2 (two) times daily. Rinse mouth 11/01/11 03/22/14  Baird Lyons D, MD  Cholecalciferol (VITAMIN D3) 1000 UNITS CAPS Take 1 capsule by mouth daily.      [provider]  conjugated estrogens (PREMARIN) vaginal cream USE AS DIRECTED 05/05/15   Dena Billet B, PA-C  cromolyn (NASALCROM) 5.2 MG/ACT nasal spray 1 spray by Nasal route 4 (four) times daily as needed.     [provider]  dexamethasone 0.5 MG/5ML elixir Take 0.5 mg by mouth 3 (three) times daily. 03/20/19   [provider]  diazepam (VALIUM) 10 MG tablet Take 1 tablet by mouth as needed. 09/04/12   [provider]  estradiol (VIVELLE-DOT) 0.05 MG/24HR patch APPLY 1 PATCH EXTERNALLY TO THE SKIN EVERY 72 HOURS 04/10/16   Dixon, Stanton Kidney B, PA-C  fish oil-omega-3 fatty acids 1000 MG capsule Take 1 g by mouth daily.      [provider]  Flaxseed, Linseed, (FLAX SEED OIL) 1000 MG CAPS Take by mouth.      [provider]  fluocinonide cream (LIDEX) 0.05 % APPLY TOPICALLY TO THE AFFECTED AREA TWICE DAILY 04/23/19   [provider]  fluticasone (FLONASE) 50 MCG/ACT nasal spray USE 1 SPRAY IN EACH NOSTRIL EVERY DAY AS DIRECTED 01/29/13   [provider]  furosemide (LASIX) 40 MG tablet Take 1 tablet by mouth Once daily as needed. 10/29/11   [provider]  gabapentin (NEURONTIN) 300 MG capsule Take 1 capsule by mouth Three times daily as needed.    [provider]  gentamicin cream  (GARAMYCIN) 0.1 % APPLY TO THE WOUNDBED WITH EACH DRESSING CHANGE AS DIRECTED IN THE CLINIC 04/24/19   [provider]  hyoscyamine (LEVSIN, ANASPAZ) 0.125 MG tablet TAKE 1 TABLET BY MOUTH EVERY 6 HOURS AS NEEDED FOR CRAMPING 08/12/17   [provider]  imipramine (TOFRANIL) 50 MG tablet Take 1 tablet by mouth daily. 10/29/11   [provider]  KLOR-CON M10 10 MEQ tablet TAKE 1 TABLET BY MOUTH EVERY DAY *PT PREFERS KLOR-CON* 09/17/17   [provider]  levothyroxine (SYNTHROID, LEVOTHROID) 100 MCG tablet Take 100 mcg by mouth every other day.    [provider]  levothyroxine (SYNTHROID, LEVOTHROID) 112 MCG tablet Take 112 mcg by mouth every other day.     [provider]  lidocaine (XYLOCAINE) 2 % solution  03/20/19   [provider]  linezolid (ZYVOX) 600 MG  tablet Take 600 mg by mouth 2 (two) times daily. 04/10/19   [provider]  LINZESS 290 MCG CAPS capsule TAKE ONE CAPSULE (290 MCG TOTAL) BY MOUTH DAILY. 10/02/17   [provider]  loratadine (CLARITIN) 10 MG tablet Take 10 mg by mouth daily.      [provider]  methocarbamol (ROBAXIN) 500 MG tablet Take 500 mg by mouth. 11/25/12   [provider]  montelukast (SINGULAIR) 10 MG tablet Take 10 mg by mouth at bedtime.      [provider]  MOVANTIK 25 MG TABS tablet TAKE 1 TABLET BY MOUTH DAILY FOR 30 DAYS. 09/03/17   [provider]  nystatin (MYCOSTATIN) powder Apply topically 2 (two) times daily. To effected areas 12/01/14   Orlena Sheldon, PA-C  olmesartan-hydrochlorothiazide (BENICAR HCT) 20-12.5 MG per tablet 1 tablet. 1/2 tablet once daily    [provider]  olmesartan-hydrochlorothiazide (BENICAR HCT) 40-25 MG tablet Take 1 tablet by mouth daily. 01/29/19   [provider]  omeprazole (PRILOSEC) 40 MG capsule TAKE 1 CAPSULE BY MOUTH TWICE DAILY 01/21/14   [provider]  Oxycodone HCl 10 MG TABS TAKE 1  TABLET (10 MG TOTAL) BY MOUTH EVERY SIX (6) HOURS AS NEEDED FOR PAIN. 09/23/17   [provider]  oxyCODONE-acetaminophen (PERCOCET) 10-325 MG tablet  10/25/17   [provider]  potassium chloride (KLOR-CON) 10 MEQ CR tablet Take 10 mEq by mouth daily.      [provider]  Spacer/Aero-Holding Chambers (AEROCHAMBER MV) inhaler by Other route. Use as instructed     [provider]     Consultants:    Procedures/Significant Events:  4/5 CT abdomen pelvis Wo contrast;-diarrheal state with findings of colitis.-No bowel obstruction. -No hydronephrosis or nephrolithiasis 4/5 DG chest; negative for cardiopulmonary disease 4/5 DG T-spine;Severe scoliosis and advanced degenerative changes.  Negative acute findings    I have personally reviewed and interpreted all radiology studies and my findings are as above.   VENTILATOR SETTINGS:    Cultures 4/5 SARS coronavirus negative   Antimicrobials: Anti-infectives (From admission, onward)   Start     Dose/Rate Route Stop   05/05/19 1400  vancomycin (VANCOCIN) 50 mg/mL oral solution 125 mg     125 mg Oral 05/15/19 1359       Devices    LINES / TUBES:      Continuous Infusions: . sodium chloride 100 mL/hr at 05/05/19 1720    Physical Exam: Vitals:   05/05/19 1355 05/05/19 1500 05/05/19 1800 05/05/19 2145  BP: (!) 130/59 124/69  138/66  Pulse: 83 89  93  Resp: 13 15  17   Temp:  99.6 F (37.6 C)  97.6 F (36.4 C)  TempSrc:  Oral  Oral  SpO2: 96% 96%  93%  Weight:   54.5 kg   Height:   4\' 11"  (1.499 m)     Wt Readings from Last 3 Encounters:  05/05/19 54.5 kg  11/04/17 59.9 kg  10/27/17 59.9 kg    General: A/O x4, no acute respiratory distress, cachectic Eyes: negative scleral hemorrhage, negative anisocoria, negative icterus ENT: Negative Runny nose, negative gingival bleeding, Neck:  Negative scars, masses, torticollis, lymphadenopathy, JVD, positive severe C-spine pain with  difficulty rotation neck. Lungs: Clear to auscultation bilaterally without wheezes or crackles Cardiovascular: Regular rate and rhythm without murmur gallop or rub normal S1 and S2 Abdomen: Positive abdominal pain, nondistended, positive soft, bowel sounds, no rebound, no ascites, no appreciable mass Extremities:  Positive swan-neck deformity bilateral hands Skin: Negative rashes, lesions, ulcers Psychiatric:  Negative depression, negative anxiety, negative fatigue, negative mania  Central nervous system:  Cranial nerves II through XII intact, tongue/uvula midline, all extremities muscle strength 5/5, sensation intact throughout, negative dysarthria, negative expressive aphasia, negative receptive aphasia.        Labs on Admission:  Basic Metabolic Panel: Recent Labs  Lab 05/04/19 1706 05/05/19 0819 05/05/19 1635  NA 121* 128* 128*  K 3.7 3.4* 3.3*  CL 94* 95* 99  CO2 14* 20* 18*  GLUCOSE 135* 113* 103*  BUN 80* 72* 66*  CREATININE 4.63* 3.15* 2.52*  CALCIUM 8.7* 8.3* 8.3*  MG  --   --  1.7  PHOS  --   --  3.3   Liver Function Tests: Recent Labs  Lab 05/04/19 1706 05/05/19 1635  AST 28 21  ALT 22 18  ALKPHOS 91 89  BILITOT 0.5 0.6  PROT 7.1 6.3*  ALBUMIN 3.4* 2.9*   Recent Labs  Lab 05/04/19 1706  LIPASE 72*   No results for input(s): AMMONIA in the last 168 hours. CBC: Recent Labs  Lab 05/04/19 1706 05/05/19 1725  WBC 12.0* 18.7*  NEUTROABS 9.2* 15.9*  HGB 10.4* 10.9*  HCT 32.1* 33.4*  MCV 85.8 84.3  PLT 294 333   Cardiac Enzymes: No results for input(s): CKTOTAL, CKMB, CKMBINDEX, TROPONINI in the last 168 hours.  BNP (last 3 results) No results for input(s): BNP in the last 8760 hours.  ProBNP (last 3 results) No results for input(s): PROBNP in the last 8760 hours.  CBG: No results for input(s): GLUCAP in the last 168 hours.  Radiological Exams on Admission: CT ABDOMEN PELVIS WO CONTRAST  Result Date: 05/04/2019 CLINICAL DATA:  86 year old  female with abdominal distension. EXAM: CT ABDOMEN AND PELVIS WITHOUT CONTRAST TECHNIQUE: Multidetector CT imaging of the abdomen and pelvis was performed following the standard protocol without IV contrast. COMPARISON:  CT abdomen pelvis dated 03/20/2017. FINDINGS: Evaluation of this exam is limited in the absence of intravenous contrast. Evaluation is also limited due to respiratory motion artifact as well as streak artifact caused by patient's arms and metallic hip replacement. Lower chest: Bibasilar linear atelectasis/scarring. There is coronary vascular calcification. No intra-abdominal free air or free fluid. Hepatobiliary: The liver is grossly unremarkable. The gallbladder is poorly visualized. A small calcific focus in the region of the porta pedis as seen previously and may represent a small focus of vascular calcification or a small gallstone. Ultrasound may provide better evaluation if there is clinical concern for acute cholecystitis. Pancreas: The pancreas is poorly visualized. No definite active inflammatory changes. Spleen: Normal in size without focal abnormality. Adrenals/Urinary Tract: The adrenal glands are suboptimally visualized. There is no hydronephrosis or nephrolithiasis on either side. There is mild parenchyma atrophy and cortical irregularity/scarring. The urinary bladder is grossly unremarkable. Stomach/Bowel: There is loose stool throughout the colon compatible with diarrheal state. Correlation with clinical exam and stool cultures recommended. Mild thickened appearance of the colon concerning for colitis. No bowel obstruction. The appendix is not visualized with certainty. No inflammatory changes identified in the right lower quadrant. Vascular/Lymphatic: Advanced aortoiliac atherosclerotic disease. The IVC is grossly unremarkable. No definite adenopathy. Reproductive: Hysterectomy. Other: Small fat containing umbilical hernia. Musculoskeletal: Osteopenia with degenerative changes of  the spine, scoliosis, and lower lumbar posterior fusion hardware. Bilateral hip replacements. No acute osseous pathology. IMPRESSION: 1. Diarrheal state with findings of colitis. Correlation with clinical exam and stool cultures recommended. No bowel  obstruction. 2. No hydronephrosis or nephrolithiasis. 3. Aortic Atherosclerosis (ICD10-I70.0). Electronically Signed   By: Anner Crete M.D.   On: 05/04/2019 18:03   DG Chest 2 View  Result Date: 05/04/2019 CLINICAL DATA:  86 year old female with fever and pain. EXAM: CHEST - 2 VIEW COMPARISON:  Chest radiograph dated 12/16/2013. FINDINGS: No focal consolidation, pleural effusion, or pneumothorax. The cardiac silhouette is within normal limits. Atherosclerotic calcification of the aorta. The aorta is tortuous. There is osteopenia with degenerative changes of the spine and scoliosis. No definite acute osseous pathology. Partially visualized lumbar fusion hardware and right shoulder arthroplasty. IMPRESSION: No active cardiopulmonary disease. Electronically Signed   By: Anner Crete M.D.   On: 05/04/2019 18:33   DG Thoracic Spine 2 View  Result Date: 05/04/2019 CLINICAL DATA:  Pain EXAM: THORACIC SPINE 2 VIEWS COMPARISON:  None. FINDINGS: Severe thoracolumbar scoliosis. Advanced diffuse degenerative disc disease. No acute bony abnormality. No fracture. IMPRESSION: Severe scoliosis and advanced degenerative changes. No acute findings. Electronically Signed   By: Rolm Baptise M.D.   On: 05/04/2019 18:33    EKG: Independently reviewed.   Assessment/Plan Active Problems:   Acute renal failure superimposed on stage 3 chronic kidney disease (HCC)   Acute on chronic renal failure (HCC)   Essential hypertension   C. difficile colitis   Scoliosis of thoracic spine   Neck pain   Anxiety   Muscle spasms of neck  C. difficile colitis -Vancomycin p.o. end date 4/16  Essential HTN -Currently controlled without medication monitor closely  Acute on CKD  stage III (baseline Cr 1.42 on 02/25/2012). Recent Labs  Lab 05/04/19 1706 05/05/19 0819 05/05/19 1635  CREATININE 4.63* 3.15* 2.52*  -Continue normal saline 166ml/hr  Neck pain/Muscle spasm neck -C-spine x-ray pending -Gabapentin 300 mg PRN -Robaxin 500 mgPRN -Percocet PRN  Scoliosis of thoracic spine -See neck pain  Medication reconciliation -Appears to be several abnormalities in her medication list, consult pharmacy in a.m. to rereconcile medication specifically taking a look at her Synthroid dosing, pain medication, respiratory medication.   Code Status: Full (DVT Prophylaxis: Subcu heparin Family Communication:   Disposition Plan: TBD   Data Reviewed: Care during the described time interval was provided by me .  I have reviewed this patient's available data, including medical history, events of note, physical examination, and all test results as part of my evaluation.   The patient is critically ill with multiple organ systems failure and requires high complexity decision making for assessment and support, frequent evaluation and titration of therapies, application of advanced monitoring technologies and extensive interpretation of multiple databases. Critical Care Time devoted to patient care services described in this note  Time spent: 13 minutes   Maysin Carstens, [facility] Pager [phone]

## 2019-05-05 NOTE — ED Notes (Signed)
Pt pulled up in bed, given cheese grits coffee, lab drawn and stool sample sent

## 2019-05-06 ENCOUNTER — Encounter (HOSPITAL_BASED_OUTPATIENT_CLINIC_OR_DEPARTMENT_OTHER): Payer: Medicare Other | Admitting: Physician Assistant

## 2019-05-06 DIAGNOSIS — N183 Chronic kidney disease, stage 3 unspecified: Secondary | ICD-10-CM

## 2019-05-06 DIAGNOSIS — N181 Chronic kidney disease, stage 1: Secondary | ICD-10-CM

## 2019-05-06 DIAGNOSIS — N170 Acute kidney failure with tubular necrosis: Secondary | ICD-10-CM

## 2019-05-06 DIAGNOSIS — A0472 Enterocolitis due to Clostridium difficile, not specified as recurrent: Secondary | ICD-10-CM

## 2019-05-06 DIAGNOSIS — N179 Acute kidney failure, unspecified: Principal | ICD-10-CM

## 2019-05-06 DIAGNOSIS — I10 Essential (primary) hypertension: Secondary | ICD-10-CM

## 2019-05-06 DIAGNOSIS — F419 Anxiety disorder, unspecified: Secondary | ICD-10-CM

## 2019-05-06 LAB — HEMOGLOBIN A1C
Hgb A1c MFr Bld: 6 % — ABNORMAL HIGH (ref 4.8–5.6)
Mean Plasma Glucose: 126 mg/dL

## 2019-05-06 LAB — CBC WITH DIFFERENTIAL/PLATELET
Abs Immature Granulocytes: 0.21 10*3/uL — ABNORMAL HIGH (ref 0.00–0.07)
Basophils Absolute: 0.1 10*3/uL (ref 0.0–0.1)
Basophils Relative: 0 %
Eosinophils Absolute: 0.5 10*3/uL (ref 0.0–0.5)
Eosinophils Relative: 2 %
HCT: 32.2 % — ABNORMAL LOW (ref 36.0–46.0)
Hemoglobin: 10.3 g/dL — ABNORMAL LOW (ref 12.0–15.0)
Immature Granulocytes: 1 %
Lymphocytes Relative: 13 %
Lymphs Abs: 2.6 10*3/uL (ref 0.7–4.0)
MCH: 27.9 pg (ref 26.0–34.0)
MCHC: 32 g/dL (ref 30.0–36.0)
MCV: 87.3 fL (ref 80.0–100.0)
Monocytes Absolute: 1.3 10*3/uL — ABNORMAL HIGH (ref 0.1–1.0)
Monocytes Relative: 7 %
Neutro Abs: 15.3 10*3/uL — ABNORMAL HIGH (ref 1.7–7.7)
Neutrophils Relative %: 77 %
Platelets: 315 10*3/uL (ref 150–400)
RBC: 3.69 MIL/uL — ABNORMAL LOW (ref 3.87–5.11)
RDW: 14.8 % (ref 11.5–15.5)
WBC: 20 10*3/uL — ABNORMAL HIGH (ref 4.0–10.5)
nRBC: 0 % (ref 0.0–0.2)

## 2019-05-06 LAB — COMPREHENSIVE METABOLIC PANEL
ALT: 17 U/L (ref 0–44)
AST: 23 U/L (ref 15–41)
Albumin: 2.8 g/dL — ABNORMAL LOW (ref 3.5–5.0)
Alkaline Phosphatase: 90 U/L (ref 38–126)
Anion gap: 11 (ref 5–15)
BUN: 57 mg/dL — ABNORMAL HIGH (ref 8–23)
CO2: 19 mmol/L — ABNORMAL LOW (ref 22–32)
Calcium: 8.1 mg/dL — ABNORMAL LOW (ref 8.9–10.3)
Chloride: 100 mmol/L (ref 98–111)
Creatinine, Ser: 2.09 mg/dL — ABNORMAL HIGH (ref 0.44–1.00)
GFR calc Af Amer: 25 mL/min — ABNORMAL LOW (ref >60)
GFR calc non Af Amer: 21 mL/min — ABNORMAL LOW (ref >60)
Glucose, Bld: 101 mg/dL — ABNORMAL HIGH (ref 70–99)
Potassium: 3.2 mmol/L — ABNORMAL LOW (ref 3.5–5.1)
Sodium: 130 mmol/L — ABNORMAL LOW (ref 135–145)
Total Bilirubin: 0.6 mg/dL (ref 0.3–1.2)
Total Protein: 6.2 g/dL — ABNORMAL LOW (ref 6.5–8.1)

## 2019-05-06 LAB — SARS CORONAVIRUS 2 (TAT 6-24 HRS): SARS Coronavirus 2: NEGATIVE

## 2019-05-06 LAB — PHOSPHORUS: Phosphorus: 2.8 mg/dL (ref 2.5–4.6)

## 2019-05-06 LAB — MAGNESIUM: Magnesium: 1.6 mg/dL — ABNORMAL LOW (ref 1.7–2.4)

## 2019-05-06 MED ORDER — POTASSIUM CHLORIDE CRYS ER 20 MEQ PO TBCR
40.0000 meq | EXTENDED_RELEASE_TABLET | Freq: Once | ORAL | Status: AC
  Administered 2019-05-06: 40 meq via ORAL
  Filled 2019-05-06: qty 2

## 2019-05-06 NOTE — Progress Notes (Signed)
   05/06/19 1500  Clinical Encounter Type  Visited With Patient  Visit Type Initial;Spiritual support  Referral From Nurse  Consult/Referral To Chaplain  Spiritual Encounters  Spiritual Needs Other (Comment);Literature   I responded to consult for an AD. Pt was awake and resting in her bed. No family present. She said she does not remember asking about an AD. She seemed a little tired. She said she not interested in a visit right now because she said she is not feeling well and that it has been lasting over 5 days. I reminded her if she needed support that informing her Nurse to page Chaplain services, we will be available to serve.   Chaplain Resident Fidel Levy [phone]

## 2019-05-06 NOTE — Progress Notes (Signed)
[facility]                                                                              Patient Demographics  Penny Perez, is a 86 y.o. female, DOB - 06-Sep-1932, GQQ:[id number]  Admit date - 05/04/2019   Admitting Physician Vianne Bulls, MD  Outpatient Primary MD for the patient is Chesley Noon, MD  Outpatient specialists:   LOS - 2  days   Medical records reviewed and are as summarized below:    Chief Complaint  Patient presents with  . Back Pain  . Abdominal Pain       Brief summary   Penny Perez a 86 y.o.WF PMHx Fibromyalgia, Chronic pain syndrome, asthma, DDD C-spine, essential HTN CKD stage IIIb (baseline Cr 1.42 on 02/25/2012).   Patient's daughter reported that she had fevers last week and was little confused.  Patient was found to have creatinine 4.6 (1.5 in November 2020) with BUN 80, bicarb 14, sodium 121, and normal potassium.  Patient was having watery diarrhea at [facility] and CT abd/pelvis findings consistent with colitis but no hydronephrosis.  She was started on IV fluids with bicarb -Patient was admitted for further work-up for hyponatremia, acute kidney injury, stool positive for C. difficile.  Assessment & Plan   Acute C. difficile colitis -Placed on oral vancomycin for 10 days, stop date on 4/16   Acute kidney injury on CKD stage IIIb -Presented with creatinine function of 4.6, was 1.5 in November 2020 -Creatinine improving to 2.0 today -Continue IV fluid hydration, encourage oral p.o. intake -Hold Lasix, olmesartan, HCTZ   Hyponatremia with metabolic acidosis -Likely due to profound dehydration, due to GI losses -Presented with sodium of 121, bicarb 14 with creatinine of 4.63 -Improving, sodium 130, bicarb improving, creatinine improving, continue IV fluid hydration   Hypothyroidism -Continue Synthroid  Chronic pain syndrome -Currently stable   Asthma Currently stable, no wheezing  GERD Continue  PPI  Code Status full CODE STATUS DVT Prophylaxis: Heparin subcu Family Communication: Discussed all imaging results, lab results, explained to the patient   Disposition Plan: Patient from home, anticipate discharge home once creatinine function has improved, tolerating diet, diarrhea improving, anticipate 24 to 48 hours   Time Spent in minutes 35 minutes  Procedures:  CT abdomen  Consultants:   None  Antimicrobials:   Anti-infectives (From admission, onward)   Start     Dose/Rate Route Frequency Ordered Stop   05/05/19 1400  vancomycin (VANCOCIN) 50 mg/mL oral solution 125 mg     125 mg Oral 4 times daily 05/05/19 1237 05/15/19 1359          Medications  Scheduled Meds: . aspirin EC  81 mg Oral Daily  . feeding supplement (ENSURE ENLIVE)  237 mL Oral BID BM  . heparin  5,000 Units Subcutaneous Q8H  . levothyroxine  100 mcg Oral QODAY  . mometasone-formoterol  2 puff Inhalation BID  . pneumococcal 23 valent vaccine  0.5 mL Intramuscular Tomorrow-1000  . vancomycin  125 mg Oral QID   Continuous Infusions: . sodium chloride 100 mL/hr at 05/06/19 0457   PRN Meds:.albuterol, diazepam, gabapentin,  methocarbamol, oxyCODONE-acetaminophen **AND** oxyCODONE      Subjective:   Penny Perez was seen and examined today.  Ill-appearing, not eating enough.  States still feels very weak.  No fevers or chills, no worsening diarrhea. Patient denies dizziness, chest pain, shortness of breath. No acute events overnight.    Objective:   Vitals:   05/06/19 0200 05/06/19 0500 05/06/19 0756 05/06/19 1114  BP: 130/70 132/82  (!) 107/51  Pulse: 84 91 89 85  Resp: 17 18 20 18   Temp: 98 F (36.7 C) 98.3 F (36.8 C)  98.5 F (36.9 C)  TempSrc: Oral Oral  Oral  SpO2: 95% 92% 93% 97%  Weight:      Height:        Intake/Output Summary (Last 24 hours) at 05/06/2019 1409 Last data filed at 05/06/2019 1106 Gross per 24 hour  Intake 440 ml  Output 4 ml  Net 436 ml     Wt  Readings from Last 3 Encounters:  05/05/19 54.5 kg  11/04/17 59.9 kg  10/27/17 59.9 kg     Exam  General: Alert and oriented x 3, NAD  Cardiovascular: S1 S2 auscultated, no murmurs, RRR  Respiratory: Clear to auscultation bilaterally, no wheezing, rales or rhonchi  Gastrointestinal: Soft, mild diffuse tenderness, nondistended, + bowel sounds  Ext: no pedal edema bilaterally  Neuro: No new deficits  Musculoskeletal: No digital cyanosis, clubbing  Skin: No rashes  Psych: Normal affect and demeanor, alert and oriented x3    Data Reviewed:  I have personally reviewed following labs and imaging studies  Micro Results Recent Results (from the past 240 hour(s))  SARS CORONAVIRUS 2 (TAT 6-24 HRS) Nasopharyngeal Nasopharyngeal Swab     Status: None   Collection Time: 05/04/19  7:04 PM   Specimen: Nasopharyngeal Swab  Result Value Ref Range Status   SARS Coronavirus 2 NEGATIVE NEGATIVE Final    Comment: (NOTE) SARS-CoV-2 target nucleic acids are NOT DETECTED. The SARS-CoV-2 RNA is generally detectable in upper and lower respiratory specimens during the acute phase of infection. Negative results do not preclude SARS-CoV-2 infection, do not rule out co-infections with other pathogens, and should not be used as the sole basis for treatment or other patient management decisions. Negative results must be combined with clinical observations, patient history, and epidemiological information. The expected result is Negative. Fact Sheet for Patients: SugarRoll.be Fact Sheet for Healthcare Providers: https://www.woods-mathews.com/ This test is not yet approved or cleared by the [place] FDA and  has been authorized for detection and/or diagnosis of SARS-CoV-2 by FDA under an Emergency Use Authorization (EUA). This EUA will remain  in effect (meaning this test can be used) for the duration of the COVID-19 declaration under Section 56  4(b)(1) of the Act, 21 U.S.C. section 360bbb-3(b)(1), unless the authorization is terminated or revoked sooner. Performed at [facility], [place] [address], [place], [place] [address]   C Difficile Quick Screen w PCR reflex     Status: Abnormal   Collection Time: 05/05/19  7:44 AM   Specimen: STOOL  Result Value Ref Range Status   C Diff antigen POSITIVE (A) NEGATIVE Final   C Diff toxin POSITIVE (A) NEGATIVE Final   C Diff interpretation Toxin producing C. difficile detected.  Final    Comment: CRITICAL RESULT CALLED TO, READ BACK BY AND VERIFIED WITH: Georges Mouse RN 11:55 05/05/19 ([email or web address]) Performed at [facility], [place] [address] [place], [place] [address]   Gastrointestinal Panel by PCR ,  Stool     Status: None   Collection Time: 05/05/19  7:44 AM  Result Value Ref Range Status   Campylobacter species NOT DETECTED NOT DETECTED Final   Plesimonas shigelloides NOT DETECTED NOT DETECTED Final   Salmonella species NOT DETECTED NOT DETECTED Final   Yersinia enterocolitica NOT DETECTED NOT DETECTED Final   Vibrio species NOT DETECTED NOT DETECTED Final   Vibrio cholerae NOT DETECTED NOT DETECTED Final   Enteroaggregative E coli (EAEC) NOT DETECTED NOT DETECTED Final   Enteropathogenic E coli (EPEC) NOT DETECTED NOT DETECTED Final   Enterotoxigenic E coli (ETEC) NOT DETECTED NOT DETECTED Final   Shiga like toxin producing E coli (STEC) NOT DETECTED NOT DETECTED Final   Shigella/Enteroinvasive E coli (EIEC) NOT DETECTED NOT DETECTED Final   Cryptosporidium NOT DETECTED NOT DETECTED Final   Cyclospora cayetanensis NOT DETECTED NOT DETECTED Final   Entamoeba histolytica NOT DETECTED NOT DETECTED Final   Giardia lamblia NOT DETECTED NOT DETECTED Final   Adenovirus F40/41 NOT DETECTED NOT DETECTED Final   Astrovirus NOT DETECTED NOT DETECTED Final   Norovirus GI/GII NOT DETECTED NOT DETECTED Final   Rotavirus A NOT DETECTED NOT DETECTED Final   Sapovirus (I, II, IV, and V) NOT  DETECTED NOT DETECTED Final    Comment: Performed at [facility], [address] [place], [place] [address]  SARS CORONAVIRUS 2 (TAT 6-24 HRS) Nasopharyngeal Nasopharyngeal Swab     Status: None   Collection Time: 05/05/19  4:19 PM   Specimen: Nasopharyngeal Swab  Result Value Ref Range Status   SARS Coronavirus 2 NEGATIVE NEGATIVE Final    Comment: (NOTE) SARS-CoV-2 target nucleic acids are NOT DETECTED. The SARS-CoV-2 RNA is generally detectable in upper and lower respiratory specimens during the acute phase of infection. Negative results do not preclude SARS-CoV-2 infection, do not rule out co-infections with other pathogens, and should not be used as the sole basis for treatment or other patient management decisions. Negative results must be combined with clinical observations, patient history, and epidemiological information. The expected result is Negative. Fact Sheet for Patients: SugarRoll.be Fact Sheet for Healthcare Providers: https://www.woods-mathews.com/ This test is not yet approved or cleared by the [place] FDA and  has been authorized for detection and/or diagnosis of SARS-CoV-2 by FDA under an Emergency Use Authorization (EUA). This EUA will remain  in effect (meaning this test can be used) for the duration of the COVID-19 declaration under Section 56 4(b)(1) of the Act, 21 U.S.C. section 360bbb-3(b)(1), unless the authorization is terminated or revoked sooner. Performed at [facility], [place] [address], [place], [place] [address]     Radiology Reports CT ABDOMEN PELVIS WO CONTRAST  Result Date: 05/04/2019 CLINICAL DATA:  86 year old female with abdominal distension. EXAM: CT ABDOMEN AND PELVIS WITHOUT CONTRAST TECHNIQUE: Multidetector CT imaging of the abdomen and pelvis was performed following the standard protocol without IV contrast. COMPARISON:  CT abdomen pelvis dated 03/20/2017. FINDINGS:  Evaluation of this exam is limited in the absence of intravenous contrast. Evaluation is also limited due to respiratory motion artifact as well as streak artifact caused by patient's arms and metallic hip replacement. Lower chest: Bibasilar linear atelectasis/scarring. There is coronary vascular calcification. No intra-abdominal free air or free fluid. Hepatobiliary: The liver is grossly unremarkable. The gallbladder is poorly visualized. A small calcific focus in the region of the porta pedis as seen previously and may represent a small focus of vascular calcification or a small gallstone. Ultrasound may provide better  evaluation if there is clinical concern for acute cholecystitis. Pancreas: The pancreas is poorly visualized. No definite active inflammatory changes. Spleen: Normal in size without focal abnormality. Adrenals/Urinary Tract: The adrenal glands are suboptimally visualized. There is no hydronephrosis or nephrolithiasis on either side. There is mild parenchyma atrophy and cortical irregularity/scarring. The urinary bladder is grossly unremarkable. Stomach/Bowel: There is loose stool throughout the colon compatible with diarrheal state. Correlation with clinical exam and stool cultures recommended. Mild thickened appearance of the colon concerning for colitis. No bowel obstruction. The appendix is not visualized with certainty. No inflammatory changes identified in the right lower quadrant. Vascular/Lymphatic: Advanced aortoiliac atherosclerotic disease. The IVC is grossly unremarkable. No definite adenopathy. Reproductive: Hysterectomy. Other: Small fat containing umbilical hernia. Musculoskeletal: Osteopenia with degenerative changes of the spine, scoliosis, and lower lumbar posterior fusion hardware. Bilateral hip replacements. No acute osseous pathology. IMPRESSION: 1. Diarrheal state with findings of colitis. Correlation with clinical exam and stool cultures recommended. No bowel obstruction. 2. No  hydronephrosis or nephrolithiasis. 3. Aortic Atherosclerosis (ICD10-I70.0). Electronically Signed   By: Anner Crete M.D.   On: 05/04/2019 18:03   DG Chest 2 View  Result Date: 05/04/2019 CLINICAL DATA:  86 year old female with fever and pain. EXAM: CHEST - 2 VIEW COMPARISON:  Chest radiograph dated 12/16/2013. FINDINGS: No focal consolidation, pleural effusion, or pneumothorax. The cardiac silhouette is within normal limits. Atherosclerotic calcification of the aorta. The aorta is tortuous. There is osteopenia with degenerative changes of the spine and scoliosis. No definite acute osseous pathology. Partially visualized lumbar fusion hardware and right shoulder arthroplasty. IMPRESSION: No active cardiopulmonary disease. Electronically Signed   By: Anner Crete M.D.   On: 05/04/2019 18:33   DG Cervical Spine 2 or 3 views  Result Date: 05/05/2019 CLINICAL DATA:  Acute pain.  No injury. EXAM: CERVICAL SPINE - 2-3 VIEW COMPARISON:  None. FINDINGS: Severe advanced degenerative facet disease bilaterally. 4 mm of anterolisthesis of C4 on C5. Degenerative disc disease in the lower cervical spine. No fracture. Prevertebral soft tissues are normal. IMPRESSION: Advanced degenerative facet disease and mild degenerative disc disease. Grade 1 anterolisthesis at C4-5 likely related to facet disease. No fracture. Electronically Signed   By: Rolm Baptise M.D.   On: 05/05/2019 23:22   DG Thoracic Spine 2 View  Result Date: 05/04/2019 CLINICAL DATA:  Pain EXAM: THORACIC SPINE 2 VIEWS COMPARISON:  None. FINDINGS: Severe thoracolumbar scoliosis. Advanced diffuse degenerative disc disease. No acute bony abnormality. No fracture. IMPRESSION: Severe scoliosis and advanced degenerative changes. No acute findings. Electronically Signed   By: Rolm Baptise M.D.   On: 05/04/2019 18:33    Lab Data:  CBC: Recent Labs  Lab 05/04/19 1706 05/05/19 1725 05/06/19 0542  WBC 12.0* 18.7* 20.0*  NEUTROABS 9.2* 15.9* 15.3*    HGB 10.4* 10.9* 10.3*  HCT 32.1* 33.4* 32.2*  MCV 85.8 84.3 87.3  PLT 294 333 253   Basic Metabolic Panel: Recent Labs  Lab 05/04/19 1706 05/05/19 0819 05/05/19 1635 05/06/19 0542  NA 121* 128* 128* 130*  K 3.7 3.4* 3.3* 3.2*  CL 94* 95* 99 100  CO2 14* 20* 18* 19*  GLUCOSE 135* 113* 103* 101*  BUN 80* 72* 66* 57*  CREATININE 4.63* 3.15* 2.52* 2.09*  CALCIUM 8.7* 8.3* 8.3* 8.1*  MG  --   --  1.7 1.6*  PHOS  --   --  3.3 2.8   GFR: Estimated Creatinine Clearance: 15.1 mL/min (A) (by C-G formula based on SCr of 2.09 mg/dL (  H)). Liver Function Tests: Recent Labs  Lab 05/04/19 1706 05/05/19 1635 05/06/19 0542  AST 28 21 23   ALT 22 18 17   ALKPHOS 91 89 90  BILITOT 0.5 0.6 0.6  PROT 7.1 6.3* 6.2*  ALBUMIN 3.4* 2.9* 2.8*   Recent Labs  Lab 05/04/19 1706  LIPASE 72*   No results for input(s): AMMONIA in the last 168 hours. Coagulation Profile: No results for input(s): INR, PROTIME in the last 168 hours. Cardiac Enzymes: No results for input(s): CKTOTAL, CKMB, CKMBINDEX, TROPONINI in the last 168 hours. BNP (last 3 results) No results for input(s): PROBNP in the last 8760 hours. HbA1C: Recent Labs    05/05/19 1725  HGBA1C 6.0*   CBG: No results for input(s): GLUCAP in the last 168 hours. Lipid Profile: No results for input(s): CHOL, HDL, LDLCALC, TRIG, CHOLHDL, LDLDIRECT in the last 72 hours. Thyroid Function Tests: No results for input(s): TSH, T4TOTAL, FREET4, T3FREE, THYROIDAB in the last 72 hours. Anemia Panel: No results for input(s): VITAMINB12, FOLATE, FERRITIN, TIBC, IRON, RETICCTPCT in the last 72 hours. Urine analysis:    Component Value Date/Time   COLORURINE YELLOW 12/05/2009 1511   APPEARANCEUR CLEAR 12/05/2009 1511   LABSPEC 1.010 12/05/2009 1511   PHURINE 6.0 12/05/2009 1511   GLUCOSEU NEGATIVE 12/05/2009 1511   HGBUR NEGATIVE 12/05/2009 1511   BILIRUBINUR NEGATIVE 12/05/2009 1511   KETONESUR NEGATIVE 12/05/2009 1511   PROTEINUR  NEGATIVE 12/05/2009 1511   UROBILINOGEN 0.2 12/05/2009 1511   NITRITE NEGATIVE 12/05/2009 1511   LEUKOCYTESUR  12/05/2009 1511    NEGATIVE MICROSCOPIC NOT DONE ON URINES WITH NEGATIVE PROTEIN, BLOOD, LEUKOCYTES, NITRITE, OR GLUCOSE <1000 mg/dL.     Estill Cotta M.D. [facility] 05/06/2019, 2:09 PM   Call night coverage person covering after 7pm

## 2019-05-07 LAB — COMPREHENSIVE METABOLIC PANEL
ALT: 13 U/L (ref 0–44)
AST: 16 U/L (ref 15–41)
Albumin: 2.2 g/dL — ABNORMAL LOW (ref 3.5–5.0)
Alkaline Phosphatase: 67 U/L (ref 38–126)
Anion gap: 8 (ref 5–15)
BUN: 42 mg/dL — ABNORMAL HIGH (ref 8–23)
CO2: 18 mmol/L — ABNORMAL LOW (ref 22–32)
Calcium: 7.8 mg/dL — ABNORMAL LOW (ref 8.9–10.3)
Chloride: 106 mmol/L (ref 98–111)
Creatinine, Ser: 1.54 mg/dL — ABNORMAL HIGH (ref 0.44–1.00)
GFR calc Af Amer: 36 mL/min — ABNORMAL LOW (ref >60)
GFR calc non Af Amer: 31 mL/min — ABNORMAL LOW (ref >60)
Glucose, Bld: 95 mg/dL (ref 70–99)
Potassium: 3.6 mmol/L (ref 3.5–5.1)
Sodium: 132 mmol/L — ABNORMAL LOW (ref 135–145)
Total Bilirubin: 0.6 mg/dL (ref 0.3–1.2)
Total Protein: 4.8 g/dL — ABNORMAL LOW (ref 6.5–8.1)

## 2019-05-07 LAB — CBC WITH DIFFERENTIAL/PLATELET
Abs Immature Granulocytes: 0.2 10*3/uL — ABNORMAL HIGH (ref 0.00–0.07)
Basophils Absolute: 0 10*3/uL (ref 0.0–0.1)
Basophils Relative: 0 %
Eosinophils Absolute: 0.7 10*3/uL — ABNORMAL HIGH (ref 0.0–0.5)
Eosinophils Relative: 5 %
HCT: 26.7 % — ABNORMAL LOW (ref 36.0–46.0)
Hemoglobin: 8.8 g/dL — ABNORMAL LOW (ref 12.0–15.0)
Immature Granulocytes: 1 %
Lymphocytes Relative: 14 %
Lymphs Abs: 2 10*3/uL (ref 0.7–4.0)
MCH: 28.6 pg (ref 26.0–34.0)
MCHC: 33 g/dL (ref 30.0–36.0)
MCV: 86.7 fL (ref 80.0–100.0)
Monocytes Absolute: 0.8 10*3/uL (ref 0.1–1.0)
Monocytes Relative: 6 %
Neutro Abs: 10.9 10*3/uL — ABNORMAL HIGH (ref 1.7–7.7)
Neutrophils Relative %: 74 %
Platelets: 276 10*3/uL (ref 150–400)
RBC: 3.08 MIL/uL — ABNORMAL LOW (ref 3.87–5.11)
RDW: 15.1 % (ref 11.5–15.5)
WBC: 14.6 10*3/uL — ABNORMAL HIGH (ref 4.0–10.5)
nRBC: 0 % (ref 0.0–0.2)

## 2019-05-07 LAB — MAGNESIUM: Magnesium: 1.4 mg/dL — ABNORMAL LOW (ref 1.7–2.4)

## 2019-05-07 LAB — PHOSPHORUS: Phosphorus: 2.5 mg/dL (ref 2.5–4.6)

## 2019-05-07 MED ORDER — SODIUM BICARBONATE 650 MG PO TABS
325.0000 mg | ORAL_TABLET | Freq: Three times a day (TID) | ORAL | Status: DC
  Administered 2019-05-07 – 2019-05-08 (×4): 325 mg via ORAL
  Filled 2019-05-07 (×4): qty 1

## 2019-05-07 MED ORDER — MAGNESIUM OXIDE 400 (241.3 MG) MG PO TABS
400.0000 mg | ORAL_TABLET | Freq: Two times a day (BID) | ORAL | Status: DC
  Administered 2019-05-07 – 2019-05-08 (×3): 400 mg via ORAL
  Filled 2019-05-07 (×3): qty 1

## 2019-05-07 MED ORDER — BOOST / RESOURCE BREEZE PO LIQD CUSTOM
1.0000 | Freq: Three times a day (TID) | ORAL | Status: DC
  Administered 2019-05-07: 1 via ORAL

## 2019-05-07 MED ORDER — ADULT MULTIVITAMIN W/MINERALS CH
1.0000 | ORAL_TABLET | Freq: Every day | ORAL | Status: DC
  Administered 2019-05-07: 1 via ORAL
  Filled 2019-05-07 (×2): qty 1

## 2019-05-07 NOTE — Evaluation (Signed)
Physical Therapy Evaluation Patient Details Name: Penny Perez MRN: [id number] DOB: 1932/02/09 Today's Date: 05/07/2019   History of Present Illness  Patient is 86 y.o. female with PMH significant for Fibromyalgia, Chronic pain syndrome, asthma, DDD C-spine, essential HTN CKD stage IIIb. Patient's daughter reported that she had fevers last week and was little confused. Patient admitted for hyponatremia, AKI, and C. Difficile.    Clinical Impression  Penny Perez is 86 y.o. female admitted with above HPI and diagnosis. Patient is currently limited by functional impairments below (see PT problem list). Patient lives alone and is independent at baseline. Patient's daughter reports she is able to stay with her initially. Patient will benefit from continued skilled PT interventions to address impairments and progress independence with mobility, recommending HHPT with 24/7 assist from family inititially. Acute PT will follow and progress as able.     Follow Up Recommendations Home health PT;Supervision/Assistance - 24 hour;Supervision for mobility/OOB(24/7 initially)    Equipment Recommendations  None recommended by PT(pt has RW at home)    Recommendations for Other Services OT consult     Precautions / Restrictions Precautions Precautions: Fall Precaution Comments: enteric precautions Restrictions Weight Bearing Restrictions: No      Mobility  Bed Mobility Overal bed mobility: Needs Assistance Bed Mobility: Supine to Sit     Supine to sit: Min assist;HOB elevated     General bed mobility comments: light assist to scoot to EOB, pt initated LE mobility and raising trunk upright  Transfers Overall transfer level: Needs assistance Equipment used: None Transfers: Sit to/from [facility] Sit to Stand: Min assist Stand pivot transfers: Min assist       General transfer comment: pt using bil UE to initiate power up from EOB, pt with great difficulty rising  and extra effort and assist needed.  Ambulation/Gait Ambulation/Gait assistance: Min assist Gait Distance (Feet): 80 Feet Assistive device: None;1 person hand held assist Gait Pattern/deviations: Step-to pattern;Decreased step length - right;Decreased step length - left;Decreased stride length;Shuffle;Drifts right/left;Narrow base of support Gait velocity: slow   General Gait Details: pt unsteady with gait, min assist required intermittently to steady and prevent LOB. pt with 2 noted episodes of LOB when turning.   Stairs            Wheelchair Mobility    Modified Rankin (Stroke Patients Only)       Balance Overall balance assessment: Needs assistance Sitting-balance support: Feet supported Sitting balance-Leahy Scale: Good     Standing balance support: During functional activity;No upper extremity supported Standing balance-Leahy Scale: Poor          Pertinent Vitals/Pain Pain Assessment: 0-10 Pain Score: 7  Pain Location: general throughour body Pain Descriptors / Indicators: Discomfort Pain Intervention(s): Limited activity within patient's tolerance;Monitored during session;Repositioned;Heat applied(warm blankets, pt cold)    Home Living Family/patient expects to be discharged to:: Private residence Living Arrangements: Alone Available Help at Discharge: Family;Available 24 hours/day(pt's daughter can be with her 24/7 for about 1 week) Type of Home: House Home Access: Stairs to enter Entrance Stairs-Rails: Left Entrance Stairs-Number of Steps: 4 Home Layout: Multi-level;Laundry or work area in [place]: Environmental consultant - 2 wheels;Cane - single point;Cane - quad;Shower seat - built in;Grab bars - tub/shower Additional Comments: pt reports she has a 6050 sqft home. She lives mainly on teh first floor and has a walk in shower and whirlpool tub on the main floor. Pt reports she cannot get in that tub and does nto like teh walk in  shower; so she goes  upstairs to the second floor to use the tub/shower combo. Pt has a shower seat and grab bars in the walk in and only has a grab bar in the 2nd floor shower.     Prior Function Level of Independence: Independent;Needs assistance   Gait / Transfers Assistance Needed: pt has been mobilizing with no device and typicall drives to grocery store and does her own shopping  ADL's / Homemaking Assistance Needed: pt is independent with showering and dressing. her daughter has been helping more and more over teh last year with cooking/house cleaning like vacumming.         Hand Dominance   Dominant Hand: Right    Extremity/Trunk Assessment   Upper Extremity Assessment Upper Extremity Assessment: Generalized weakness    Lower Extremity Assessment Lower Extremity Assessment: Generalized weakness    Cervical / Trunk Assessment Cervical / Trunk Assessment: Kyphotic  Communication   Communication: HOH  Cognition Arousal/Alertness: Awake/alert Behavior During Therapy: WFL for tasks assessed/performed Overall Cognitive Status: Within Functional Limits for tasks assessed         General Comments      Exercises General Exercises - Lower Extremity Ankle Circles/Pumps: AROM;Both;15 reps;Seated(for edema)   Assessment/Plan    PT Assessment Patient needs continued PT services  PT Problem List Decreased strength;Decreased activity tolerance;Decreased balance;Decreased mobility;Decreased knowledge of use of DME;Decreased safety awareness       PT Treatment Interventions DME instruction;Gait training;Functional mobility training;Stair training;Therapeutic activities;Therapeutic exercise;Balance training;Patient/family education    PT Goals (Current goals can be found in the Care Plan section)  Acute Rehab PT Goals Patient Stated Goal: to get back to normal independence PT Goal Formulation: With patient Time For Goal Achievement: 05/21/19 Potential to Achieve Goals: Good    Frequency  Min 3X/week    AM-PAC PT "6 Clicks" Mobility  Outcome Measure Help needed turning from your back to your side while in a flat bed without using bedrails?: A Little Help needed moving from lying on your back to sitting on the side of a flat bed without using bedrails?: A Little Help needed moving to and from a bed to a chair (including a wheelchair)?: A Little Help needed standing up from a chair using your arms (e.g., wheelchair or bedside chair)?: A Little Help needed to walk in hospital room?: A Little Help needed climbing 3-5 steps with a railing? : A Lot 6 Click Score: 17    End of Session Equipment Utilized During Treatment: Gait belt Activity Tolerance: Patient tolerated treatment well Patient left: in chair;with call bell/phone within reach;with chair alarm set;with family/visitor present Nurse Communication: Mobility status PT Visit Diagnosis: Unsteadiness on feet (R26.81);Other abnormalities of gait and mobility (R26.89);Muscle weakness (generalized) (M62.81);Difficulty in walking, not elsewhere classified (R26.2)    Time: 4128-7867 PT Time Calculation (min) (ACUTE ONLY): 35 min   Charges:   PT Evaluation $PT Eval Moderate Complexity: 1 Mod PT Treatments $Gait Training: 8-22 mins        Verner Mould, DPT Physical Therapist with [facility] [phone]  05/07/2019 5:43 PM

## 2019-05-07 NOTE — Progress Notes (Signed)
[facility]                                                                              Patient Demographics  Penny Perez, is a 86 y.o. female, DOB - 05/03/1932, NWG:[id number]  Admit date - 05/04/2019   Admitting Physician Vianne Bulls, MD  Outpatient Primary MD for the patient is Chesley Noon, MD  Outpatient specialists:   LOS - 3  days   Medical records reviewed and are as summarized below:    Chief Complaint  Patient presents with  . Back Pain  . Abdominal Pain       Brief summary   Penny Perez a 86 y.o.WF PMHx Fibromyalgia, Chronic pain syndrome, asthma, DDD C-spine, essential HTN CKD stage IIIb (baseline Cr 1.42 on 02/25/2012).   Patient's daughter reported that she had fevers last week and was little confused.  Patient was found to have creatinine 4.6 (1.5 in November 2020) with BUN 80, bicarb 14, sodium 121, and normal potassium.  Patient was having watery diarrhea at [facility] and CT abd/pelvis findings consistent with colitis but no hydronephrosis.  She was started on IV fluids with bicarb -Patient was admitted for further work-up for hyponatremia, acute kidney injury, stool positive for C. difficile.  Assessment & Plan   Acute C. difficile colitis -Placed on oral vancomycin for 10 days, stop date on 4/16 -Continue oral vancomycin, diarrhea slowly improving.  Last BM earlier this morning still type VI -Leukocytosis improving   Acute kidney injury on CKD stage IIIb -Presented with creatinine function of 4.6, was 1.5 in November 2020 -Continue IV fluid hydration, encourage oral p.o. intake -Continue to hold Lasix, olmesartan, HCTZ -Creatinine improving 1.5, baseline 1.1-1.2  Hyponatremia with metabolic acidosis -Likely due to profound dehydration, due to GI losses -Presented with sodium of 121, bicarb 14 with creatinine of 4.63 -Slowly improving, sodium 132, bicarb trending down 18, placed on sodium bicarb 325 mg   TID  Hypothyroidism -Continue Synthroid  Chronic pain syndrome -Currently stable   Asthma Currently stable, no wheezing  GERD Continue PPI  Code Status full CODE STATUS DVT Prophylaxis: Heparin subcu Family Communication: Discussed all imaging results, lab results, explained to the patient and patient's daughter on the phone   Disposition Plan: Patient from home, lives alone, anticipate discharge home tomorrow once creatinine function has improved, obtain PT OT evaluation to assess functional status.  Time Spent in minutes 35 minutes  Procedures:  CT abdomen  Consultants:   None  Antimicrobials:   Anti-infectives (From admission, onward)   Start     Dose/Rate Route Frequency Ordered Stop   05/05/19 1400  vancomycin (VANCOCIN) 50 mg/mL oral solution 125 mg     125 mg Oral 4 times daily 05/05/19 1237 05/15/19 1359         Medications  Scheduled Meds: . aspirin EC  81 mg Oral Daily  . feeding supplement (ENSURE ENLIVE)  237 mL Oral BID BM  . heparin  5,000 Units Subcutaneous Q8H  . levothyroxine  100 mcg Oral QODAY  . magnesium oxide  400 mg Oral BID  . mometasone-formoterol  2 puff Inhalation BID  .  pneumococcal 23 valent vaccine  0.5 mL Intramuscular Tomorrow-1000  . sodium bicarbonate  325 mg Oral TID  . vancomycin  125 mg Oral QID   Continuous Infusions: . sodium chloride 100 mL/hr at 05/07/19 1015   PRN Meds:.albuterol, diazepam, gabapentin, methocarbamol, oxyCODONE-acetaminophen **AND** oxyCODONE      Subjective:   Penny Perez was seen and examined today.  Diarrhea improving, 1 BM at night and one this morning both type VI.  Still feels very weak.  No fevers or chills. Patient denies dizziness, chest pain, shortness of breath. No acute events overnight.    Objective:   Vitals:   05/06/19 2050 05/07/19 0500 05/07/19 0754 05/07/19 1319  BP: (!) 127/57 110/60  133/60  Pulse: 78 80  68  Resp: 17 20  14   Temp: 98.5 F (36.9 C) 98 F  (36.7 C)  98.6 F (37 C)  TempSrc: Oral Oral  Oral  SpO2: 96% 98% 97% 99%  Weight:      Height:        Intake/Output Summary (Last 24 hours) at 05/07/2019 1413 Last data filed at 05/07/2019 1300 Gross per 24 hour  Intake 3310 ml  Output --  Net 3310 ml     Wt Readings from Last 3 Encounters:  05/05/19 54.5 kg  11/04/17 59.9 kg  10/27/17 59.9 kg   Physical Exam  General: Alert and oriented x 3, NAD  Cardiovascular: S1 S2 clear, RRR. No pedal edema b/l  Respiratory: CTAB, no wheezing, rales or rhonchi  Gastrointestinal: Soft, nontender, nondistended, NBS  Ext: no pedal edema bilaterally  Neuro: no new deficits  Musculoskeletal: No cyanosis, clubbing  Skin: No rashes  Psych: Normal affect and demeanor, alert and oriented x3   Data Reviewed:  I have personally reviewed following labs and imaging studies  Micro Results Recent Results (from the past 240 hour(s))  SARS CORONAVIRUS 2 (TAT 6-24 HRS) Nasopharyngeal Nasopharyngeal Swab     Status: None   Collection Time: 05/04/19  7:04 PM   Specimen: Nasopharyngeal Swab  Result Value Ref Range Status   SARS Coronavirus 2 NEGATIVE NEGATIVE Final    Comment: (NOTE) SARS-CoV-2 target nucleic acids are NOT DETECTED. The SARS-CoV-2 RNA is generally detectable in upper and lower respiratory specimens during the acute phase of infection. Negative results do not preclude SARS-CoV-2 infection, do not rule out co-infections with other pathogens, and should not be used as the sole basis for treatment or other patient management decisions. Negative results must be combined with clinical observations, patient history, and epidemiological information. The expected result is Negative. Fact Sheet for Patients: SugarRoll.be Fact Sheet for Healthcare Providers: https://www.woods-mathews.com/ This test is not yet approved or cleared by the [place] FDA and  has been authorized for  detection and/or diagnosis of SARS-CoV-2 by FDA under an Emergency Use Authorization (EUA). This EUA will remain  in effect (meaning this test can be used) for the duration of the COVID-19 declaration under Section 56 4(b)(1) of the Act, 21 U.S.C. section 360bbb-3(b)(1), unless the authorization is terminated or revoked sooner. Performed at [facility], [place] [address], [place], [place] [address]   C Difficile Quick Screen w PCR reflex     Status: Abnormal   Collection Time: 05/05/19  7:44 AM   Specimen: STOOL  Result Value Ref Range Status   C Diff antigen POSITIVE (A) NEGATIVE Final   C Diff toxin POSITIVE (A) NEGATIVE Final   C Diff interpretation Toxin producing C. difficile detected.  Final  Comment: CRITICAL RESULT CALLED TO, READ BACK BY AND VERIFIED WITH: Georges Mouse RN 11:55 05/05/19 ([email or web address]) Performed at [facility], [place] [address], [place], [place] [address]   Gastrointestinal Panel by PCR , Stool     Status: None   Collection Time: 05/05/19  7:44 AM  Result Value Ref Range Status   Campylobacter species NOT DETECTED NOT DETECTED Final   Plesimonas shigelloides NOT DETECTED NOT DETECTED Final   Salmonella species NOT DETECTED NOT DETECTED Final   Yersinia enterocolitica NOT DETECTED NOT DETECTED Final   Vibrio species NOT DETECTED NOT DETECTED Final   Vibrio cholerae NOT DETECTED NOT DETECTED Final   Enteroaggregative E coli (EAEC) NOT DETECTED NOT DETECTED Final   Enteropathogenic E coli (EPEC) NOT DETECTED NOT DETECTED Final   Enterotoxigenic E coli (ETEC) NOT DETECTED NOT DETECTED Final   Shiga like toxin producing E coli (STEC) NOT DETECTED NOT DETECTED Final   Shigella/Enteroinvasive E coli (EIEC) NOT DETECTED NOT DETECTED Final   Cryptosporidium NOT DETECTED NOT DETECTED Final   Cyclospora cayetanensis NOT DETECTED NOT DETECTED Final   Entamoeba histolytica NOT DETECTED NOT DETECTED Final   Giardia lamblia NOT DETECTED NOT DETECTED Final   Adenovirus  F40/41 NOT DETECTED NOT DETECTED Final   Astrovirus NOT DETECTED NOT DETECTED Final   Norovirus GI/GII NOT DETECTED NOT DETECTED Final   Rotavirus A NOT DETECTED NOT DETECTED Final   Sapovirus (I, II, IV, and V) NOT DETECTED NOT DETECTED Final    Comment: Performed at [facility], [place] [place], [place] [address]  SARS CORONAVIRUS 2 (TAT 6-24 HRS) Nasopharyngeal Nasopharyngeal Swab     Status: None   Collection Time: 05/05/19  4:19 PM   Specimen: Nasopharyngeal Swab  Result Value Ref Range Status   SARS Coronavirus 2 NEGATIVE NEGATIVE Final    Comment: (NOTE) SARS-CoV-2 target nucleic acids are NOT DETECTED. The SARS-CoV-2 RNA is generally detectable in upper and lower respiratory specimens during the acute phase of infection. Negative results do not preclude SARS-CoV-2 infection, do not rule out co-infections with other pathogens, and should not be used as the sole basis for treatment or other patient management decisions. Negative results must be combined with clinical observations, patient history, and epidemiological information. The expected result is Negative. Fact Sheet for Patients: SugarRoll.be Fact Sheet for Healthcare Providers: https://www.woods-mathews.com/ This test is not yet approved or cleared by the [place] FDA and  has been authorized for detection and/or diagnosis of SARS-CoV-2 by FDA under an Emergency Use Authorization (EUA). This EUA will remain  in effect (meaning this test can be used) for the duration of the COVID-19 declaration under Section 56 4(b)(1) of the Act, 21 U.S.C. section 360bbb-3(b)(1), unless the authorization is terminated or revoked sooner. Performed at [facility], [place] [address] [place], [place] [address]     Radiology Reports CT ABDOMEN PELVIS WO CONTRAST  Result Date: 05/04/2019 CLINICAL DATA:  86 year old female with abdominal distension. EXAM: CT ABDOMEN AND  PELVIS WITHOUT CONTRAST TECHNIQUE: Multidetector CT imaging of the abdomen and pelvis was performed following the standard protocol without IV contrast. COMPARISON:  CT abdomen pelvis dated 03/20/2017. FINDINGS: Evaluation of this exam is limited in the absence of intravenous contrast. Evaluation is also limited due to respiratory motion artifact as well as streak artifact caused by patient's arms and metallic hip replacement. Lower chest: Bibasilar linear atelectasis/scarring. There is coronary vascular calcification. No intra-abdominal free air or free fluid. Hepatobiliary: The liver is grossly  unremarkable. The gallbladder is poorly visualized. A small calcific focus in the region of the porta pedis as seen previously and may represent a small focus of vascular calcification or a small gallstone. Ultrasound may provide better evaluation if there is clinical concern for acute cholecystitis. Pancreas: The pancreas is poorly visualized. No definite active inflammatory changes. Spleen: Normal in size without focal abnormality. Adrenals/Urinary Tract: The adrenal glands are suboptimally visualized. There is no hydronephrosis or nephrolithiasis on either side. There is mild parenchyma atrophy and cortical irregularity/scarring. The urinary bladder is grossly unremarkable. Stomach/Bowel: There is loose stool throughout the colon compatible with diarrheal state. Correlation with clinical exam and stool cultures recommended. Mild thickened appearance of the colon concerning for colitis. No bowel obstruction. The appendix is not visualized with certainty. No inflammatory changes identified in the right lower quadrant. Vascular/Lymphatic: Advanced aortoiliac atherosclerotic disease. The IVC is grossly unremarkable. No definite adenopathy. Reproductive: Hysterectomy. Other: Small fat containing umbilical hernia. Musculoskeletal: Osteopenia with degenerative changes of the spine, scoliosis, and lower lumbar posterior fusion  hardware. Bilateral hip replacements. No acute osseous pathology. IMPRESSION: 1. Diarrheal state with findings of colitis. Correlation with clinical exam and stool cultures recommended. No bowel obstruction. 2. No hydronephrosis or nephrolithiasis. 3. Aortic Atherosclerosis (ICD10-I70.0). Electronically Signed   By: Anner Crete M.D.   On: 05/04/2019 18:03   DG Chest 2 View  Result Date: 05/04/2019 CLINICAL DATA:  86 year old female with fever and pain. EXAM: CHEST - 2 VIEW COMPARISON:  Chest radiograph dated 12/16/2013. FINDINGS: No focal consolidation, pleural effusion, or pneumothorax. The cardiac silhouette is within normal limits. Atherosclerotic calcification of the aorta. The aorta is tortuous. There is osteopenia with degenerative changes of the spine and scoliosis. No definite acute osseous pathology. Partially visualized lumbar fusion hardware and right shoulder arthroplasty. IMPRESSION: No active cardiopulmonary disease. Electronically Signed   By: Anner Crete M.D.   On: 05/04/2019 18:33   DG Cervical Spine 2 or 3 views  Result Date: 05/05/2019 CLINICAL DATA:  Acute pain.  No injury. EXAM: CERVICAL SPINE - 2-3 VIEW COMPARISON:  None. FINDINGS: Severe advanced degenerative facet disease bilaterally. 4 mm of anterolisthesis of C4 on C5. Degenerative disc disease in the lower cervical spine. No fracture. Prevertebral soft tissues are normal. IMPRESSION: Advanced degenerative facet disease and mild degenerative disc disease. Grade 1 anterolisthesis at C4-5 likely related to facet disease. No fracture. Electronically Signed   By: Rolm Baptise M.D.   On: 05/05/2019 23:22   DG Thoracic Spine 2 View  Result Date: 05/04/2019 CLINICAL DATA:  Pain EXAM: THORACIC SPINE 2 VIEWS COMPARISON:  None. FINDINGS: Severe thoracolumbar scoliosis. Advanced diffuse degenerative disc disease. No acute bony abnormality. No fracture. IMPRESSION: Severe scoliosis and advanced degenerative changes. No acute  findings. Electronically Signed   By: Rolm Baptise M.D.   On: 05/04/2019 18:33    Lab Data:  CBC: Recent Labs  Lab 05/04/19 1706 05/05/19 1725 05/06/19 0542 05/07/19 0517  WBC 12.0* 18.7* 20.0* 14.6*  NEUTROABS 9.2* 15.9* 15.3* 10.9*  HGB 10.4* 10.9* 10.3* 8.8*  HCT 32.1* 33.4* 32.2* 26.7*  MCV 85.8 84.3 87.3 86.7  PLT 294 333 315 268   Basic Metabolic Panel: Recent Labs  Lab 05/04/19 1706 05/05/19 0819 05/05/19 1635 05/06/19 0542 05/07/19 0517  NA 121* 128* 128* 130* 132*  K 3.7 3.4* 3.3* 3.2* 3.6  CL 94* 95* 99 100 106  CO2 14* 20* 18* 19* 18*  GLUCOSE 135* 113* 103* 101* 95  BUN 80* 72* 66*  57* 42*  CREATININE 4.63* 3.15* 2.52* 2.09* 1.54*  CALCIUM 8.7* 8.3* 8.3* 8.1* 7.8*  MG  --   --  1.7 1.6* 1.4*  PHOS  --   --  3.3 2.8 2.5   GFR: Estimated Creatinine Clearance: 20.5 mL/min (A) (by C-G formula based on SCr of 1.54 mg/dL (H)). Liver Function Tests: Recent Labs  Lab 05/04/19 1706 05/05/19 1635 05/06/19 0542 05/07/19 0517  AST 28 21 23 16   ALT 22 18 17 13   ALKPHOS 91 89 90 67  BILITOT 0.5 0.6 0.6 0.6  PROT 7.1 6.3* 6.2* 4.8*  ALBUMIN 3.4* 2.9* 2.8* 2.2*   Recent Labs  Lab 05/04/19 1706  LIPASE 72*   No results for input(s): AMMONIA in the last 168 hours. Coagulation Profile: No results for input(s): INR, PROTIME in the last 168 hours. Cardiac Enzymes: No results for input(s): CKTOTAL, CKMB, CKMBINDEX, TROPONINI in the last 168 hours. BNP (last 3 results) No results for input(s): PROBNP in the last 8760 hours. HbA1C: Recent Labs    05/05/19 1725  HGBA1C 6.0*   CBG: No results for input(s): GLUCAP in the last 168 hours. Lipid Profile: No results for input(s): CHOL, HDL, LDLCALC, TRIG, CHOLHDL, LDLDIRECT in the last 72 hours. Thyroid Function Tests: No results for input(s): TSH, T4TOTAL, FREET4, T3FREE, THYROIDAB in the last 72 hours. Anemia Panel: No results for input(s): VITAMINB12, FOLATE, FERRITIN, TIBC, IRON, RETICCTPCT in the last  72 hours. Urine analysis:    Component Value Date/Time   COLORURINE YELLOW 12/05/2009 1511   APPEARANCEUR CLEAR 12/05/2009 1511   LABSPEC 1.010 12/05/2009 1511   PHURINE 6.0 12/05/2009 1511   GLUCOSEU NEGATIVE 12/05/2009 1511   HGBUR NEGATIVE 12/05/2009 1511   BILIRUBINUR NEGATIVE 12/05/2009 1511   KETONESUR NEGATIVE 12/05/2009 1511   PROTEINUR NEGATIVE 12/05/2009 1511   UROBILINOGEN 0.2 12/05/2009 1511   NITRITE NEGATIVE 12/05/2009 1511   LEUKOCYTESUR  12/05/2009 1511    NEGATIVE MICROSCOPIC NOT DONE ON URINES WITH NEGATIVE PROTEIN, BLOOD, LEUKOCYTES, NITRITE, OR GLUCOSE <1000 mg/dL.     Estill Cotta M.D. [facility] 05/07/2019, 2:13 PM   Call night coverage person covering after 7pm

## 2019-05-07 NOTE — Plan of Care (Signed)
  Problem: Education: Goal: Knowledge of General Education information will improve Description: Including pain rating scale, medication(s)/side effects and non-pharmacologic comfort measures Outcome: Progressing   Problem: Health Behavior/Discharge Planning: Goal: Ability to manage health-related needs will improve Outcome: Progressing   Problem: Clinical Measurements: Goal: Ability to maintain clinical measurements within normal limits will improve Outcome: Progressing Goal: Will remain free from infection Outcome: Progressing Goal: Respiratory complications will improve Outcome: Completed/Met Goal: Cardiovascular complication will be avoided Outcome: Progressing   Problem: Activity: Goal: Risk for activity intolerance will decrease Outcome: Progressing   Problem: Nutrition: Goal: Adequate nutrition will be maintained Outcome: Progressing   Problem: Coping: Goal: Level of anxiety will decrease Outcome: Progressing   Problem: Elimination: Goal: Will not experience complications related to bowel motility Outcome: Progressing Goal: Will not experience complications related to urinary retention Outcome: Completed/Met   Problem: Pain Managment: Goal: General experience of comfort will improve Outcome: Progressing   Problem: Safety: Goal: Ability to remain free from injury will improve Outcome: Progressing   Problem: Skin Integrity: Goal: Risk for impaired skin integrity will decrease Outcome: Progressing   Problem: Education: Goal: Knowledge of disease and its progression will improve Outcome: Progressing Goal: Individualized Educational Video(s) Outcome: Not Applicable   Problem: Fluid Volume: Goal: Compliance with measures to maintain balanced fluid volume will improve Outcome: Progressing   Problem: Health Behavior/Discharge Planning: Goal: Ability to manage health-related needs will improve Outcome: Progressing   Problem: Nutritional: Goal: Ability to  make healthy dietary choices will improve Outcome: Progressing   Problem: Clinical Measurements: Goal: Complications related to the disease process, condition or treatment will be avoided or minimized Outcome: Progressing

## 2019-05-07 NOTE — Progress Notes (Signed)
Initial Nutrition Assessment  DOCUMENTATION CODES:   Not applicable  INTERVENTION:  Discontinue Ensure d/t pt dislike  Provide Boost Breeze po TID, each supplement provides 250 kcal and 9 grams of protein  Regular diet order  MVI with minerals   NUTRITION DIAGNOSIS:   Inadequate oral intake related to acute illness, decreased appetite(acute colitis) as evidenced by meal completion < 50%(dislike of foods available on heart healthy diet order).   GOAL:   Patient will meet greater than or equal to 90% of their needs   MONITOR:   PO intake, Supplement acceptance, Labs, Weight trends, Diet advancement  REASON FOR ASSESSMENT:   Malnutrition Screening Tool    ASSESSMENT:   86 year old female with past medical history of fibromyalgia, chronic pain syndrome, asthma, severe scoliosis, DDD, essential HTN, CKD stage IIIb presented with 1 week history of fever, weakness, increased confusion, watery diarrhea and admitted on 4/5 with acute colitis.  Patient has been on a heart healthy diet since admission, per flowsheets pt ate 50% of lunch and 30% of dinner on 4/7. She is provided Ensure twice daily, noted refusal of supplement this morning per medication review. Patient sitting up in bed reports ongoing weakness this morning. She endorses poor intake during admission secondary to [facility] order, pt stated the food has absolutely no flavor, complained of cold eggs this morning and requested a regular diet. Patient reports that the Ensure supplement makes her feel bloated and she is unable to drink them, amenable to trying Boost Breeze supplement. Lunch tray delivered during visit, RD assisted pt with raising the head of the bed and tray set up.   Non-pitting LUE, mild pitting RUE, deep pitting BLE edema noted per RN assessment. Patient recalls UBW of 115 lbs and current wt is 120 lbs, suspect actual wt to be less given noted edema. Limited recent wt history for review. Per care everywhere,  on 04/21/19 pt weighed 115.06 lbs on 08/13/18 pt weighed 114.84 lbs.  Medications reviewed and include: Mag-ox, sodium bicarbonate tablet, vancomycin IVF: NaCl Labs: Na 132 (L) trending up, Mg 1.4 (L) trending down, WBC 14.6 (H) K/P - WNL  NUTRITION - FOCUSED PHYSICAL EXAM: Deferred  Diet Order:   Diet Order            Diet regular Room service appropriate? Yes; Fluid consistency: Thin  Diet effective now              EDUCATION NEEDS:   No education needs have been identified at this time  Skin:  Skin Assessment: Reviewed RN Assessment  Last BM:  4/7 type 6  Height:   Ht Readings from Last 1 Encounters:  05/05/19 4\' 11"  (1.499 m)    Weight:   Wt Readings from Last 1 Encounters:  05/05/19 54.5 kg    Ideal Body Weight:  44.3 kg  BMI:  Body mass index is 24.27 kg/m.  Estimated Nutritional Needs:   Kcal:  5364-6803  Protein:  70-85  Fluid:  >/= 1.4 L/day   Lajuan Lines, RD, LDN Clinical Nutrition After Hours/Weekend Pager # in [place]

## 2019-05-07 NOTE — Care Management Important Message (Signed)
Important Message  Patient Details IM Letter given to Dessa Phi RN Case Manager to present to the Patient Name: Penny Perez MRN: [id number] Date of Birth: 1932-03-21   Medicare Important Message Given:  Yes     Kerin Salen 05/07/2019, 11:29 AM

## 2019-05-08 LAB — CBC
HCT: 25.6 % — ABNORMAL LOW (ref 36.0–46.0)
Hemoglobin: 8.2 g/dL — ABNORMAL LOW (ref 12.0–15.0)
MCH: 27.8 pg (ref 26.0–34.0)
MCHC: 32 g/dL (ref 30.0–36.0)
MCV: 86.8 fL (ref 80.0–100.0)
Platelets: 268 10*3/uL (ref 150–400)
RBC: 2.95 MIL/uL — ABNORMAL LOW (ref 3.87–5.11)
RDW: 15.3 % (ref 11.5–15.5)
WBC: 8.6 10*3/uL (ref 4.0–10.5)
nRBC: 0 % (ref 0.0–0.2)

## 2019-05-08 LAB — MAGNESIUM: Magnesium: 1.4 mg/dL — ABNORMAL LOW (ref 1.7–2.4)

## 2019-05-08 LAB — BASIC METABOLIC PANEL
Anion gap: 9 (ref 5–15)
BUN: 29 mg/dL — ABNORMAL HIGH (ref 8–23)
CO2: 19 mmol/L — ABNORMAL LOW (ref 22–32)
Calcium: 8 mg/dL — ABNORMAL LOW (ref 8.9–10.3)
Chloride: 107 mmol/L (ref 98–111)
Creatinine, Ser: 1.4 mg/dL — ABNORMAL HIGH (ref 0.44–1.00)
GFR calc Af Amer: 40 mL/min — ABNORMAL LOW (ref >60)
GFR calc non Af Amer: 34 mL/min — ABNORMAL LOW (ref >60)
Glucose, Bld: 95 mg/dL (ref 70–99)
Potassium: 3.6 mmol/L (ref 3.5–5.1)
Sodium: 135 mmol/L (ref 135–145)

## 2019-05-08 MED ORDER — MOVANTIK 25 MG PO TABS: 25.0000 mg | ORAL_TABLET | Freq: Every day | ORAL | 0 refills | Status: AC | PRN

## 2019-05-08 MED ORDER — SODIUM BICARBONATE 325 MG PO TABS: 325.0000 mg | ORAL_TABLET | Freq: Three times a day (TID) | ORAL | 0 refills | Status: DC

## 2019-05-08 MED ORDER — VANCOMYCIN HCL 125 MG PO CAPS: 125.0000 mg | ORAL_CAPSULE | Freq: Four times a day (QID) | ORAL | 0 refills | Status: AC

## 2019-05-08 MED ORDER — MAGNESIUM SULFATE 50 % IJ SOLN
3.0000 g | Freq: Once | INTRAVENOUS | Status: AC
  Administered 2019-05-08: 3 g via INTRAVENOUS
  Filled 2019-05-08: qty 6

## 2019-05-08 MED ORDER — LINZESS 290 MCG PO CAPS: 290.0000 ug | ORAL_CAPSULE | Freq: Every day | ORAL | 3 refills | Status: DC | PRN

## 2019-05-08 MED ORDER — MAGNESIUM OXIDE 400 (241.3 MG) MG PO TABS: 400.0000 mg | ORAL_TABLET | Freq: Two times a day (BID) | ORAL | 0 refills | Status: AC

## 2019-05-08 MED ORDER — METHOCARBAMOL 500 MG PO TABS: 500.0000 mg | ORAL_TABLET | Freq: Three times a day (TID) | ORAL | Status: DC | PRN

## 2019-05-08 MED ORDER — SODIUM BICARBONATE 325 MG PO TABS: 325.0000 mg | ORAL_TABLET | Freq: Two times a day (BID) | ORAL | 0 refills | Status: AC

## 2019-05-08 NOTE — Progress Notes (Signed)
Pt discharged home today per Dr. Tana Coast. Pt's IV site D/C'd and WDL. Pt's VSS. Pt and patient's daughter provided with home medication list, discharge instructions and prescriptions. Verbalized understanding. Pt left floor via WC in stable condition accompanied by NT.

## 2019-05-08 NOTE — Discharge Summary (Signed)
Physician Discharge Summary   Patient ID: Penny Perez MRN: [id number] DOB/AGE: [birth year] 84 y.o.  Admit date: 05/04/2019 Discharge date: 05/08/2019  Primary Care Physician:  Chesley Noon, MD   Recommendations for Outpatient Follow-up:  1. Follow up with PCP in 1-2 weeks 2. Patient placed on oral vancomycin for total of 10 days for C. difficile colitis 3. Lasix, Benicar HCTZ currently on hold due to AKI, soft BP  Home Health: Home health PT, RN Equipment/Devices:   Discharge Condition: stable  CODE STATUS: FULL  Diet recommendation: Heart healthy diet   Discharge Diagnoses:    . Acute renal failure superimposed on stage 3 chronic kidney disease ([place]) . Hyponatremia with metabolic acidosis . Essential hypertension . Acute C. difficile colitis . Scoliosis of thoracic spine . GERD . Anxiety . Chronic pain syndrome   Consults: None    Allergies:   Allergies  Allergen Reactions  . Linezolid Nausea And Vomiting  . Ampicillin   . Cephalexin   . Clarithromycin   . Codeine   . Doxycycline   . Erythromycin   . Gatifloxacin   . Hydrocodone   . Penicillins      DISCHARGE MEDICATIONS: Allergies as of 05/08/2019      Reactions   Linezolid Nausea And Vomiting   Ampicillin    Cephalexin    Clarithromycin    Codeine    Doxycycline    Erythromycin    Gatifloxacin    Hydrocodone    Penicillins       Medication List    STOP taking these medications   aspirin 81 MG tablet   dexamethasone 0.5 MG/5ML elixir   furosemide 40 MG tablet Commonly known as: LASIX   Klor-Con M10 10 MEQ tablet Generic drug: potassium chloride   lidocaine 2 % solution Commonly known as: XYLOCAINE   linezolid 600 MG tablet Commonly known as: ZYVOX   olmesartan-hydrochlorothiazide 20-12.5 MG tablet Commonly known as: BENICAR HCT   olmesartan-hydrochlorothiazide 40-25 MG tablet Commonly known as: BENICAR HCT   potassium chloride 10 MEQ CR tablet Commonly known as:  KLOR-CON     TAKE these medications   AeroChamber MV inhaler by Other route. Use as instructed   albuterol 108 (90 Base) MCG/ACT inhaler Commonly known as: ProAir HFA 2 puffs every 4 hours if needed- rescue inhaler   azelastine 0.1 % nasal spray Commonly known as: ASTELIN USE 1 SPRAY IN EACH NOSTRIL TWICE DAILY AS DIRECTED   budesonide-formoterol 80-4.5 MCG/ACT inhaler Commonly known as: SYMBICORT Inhale 2 puffs into the lungs 2 (two) times daily. Rinse mouth   conjugated estrogens vaginal cream Commonly known as: Premarin USE AS DIRECTED   cromolyn 5.2 MG/ACT nasal spray Commonly known as: NASALCROM 1 spray by Nasal route 4 (four) times daily as needed.   diazepam 10 MG tablet Commonly known as: VALIUM Take 1 tablet by mouth as needed.   estradiol 0.05 MG/24HR patch Commonly known as: Vivelle-Dot APPLY 1 PATCH EXTERNALLY TO THE SKIN EVERY 72 HOURS What changed:   how much to take  how to take this  when to take this   fish oil-omega-3 fatty acids 1000 MG capsule Take 1 g by mouth daily.   Flax Seed Oil 1000 MG Caps Take by mouth.   fluocinonide cream 0.05 % Commonly known as: LIDEX Apply 1 application topically 2 (two) times daily.   fluticasone 50 MCG/ACT nasal spray Commonly known as: FLONASE USE 1 SPRAY IN EACH NOSTRIL EVERY DAY AS DIRECTED   gabapentin 300 MG  capsule Commonly known as: NEURONTIN Take 1 capsule by mouth Three times daily as needed.   gentamicin cream 0.1 % Commonly known as: GARAMYCIN Apply 1 application topically as directed.   hyoscyamine 0.125 MG tablet Commonly known as: LEVSIN TAKE 1 TABLET BY MOUTH EVERY 6 HOURS AS NEEDED FOR CRAMPING   imipramine 50 MG tablet Commonly known as: TOFRANIL Take 1 tablet by mouth daily.   levothyroxine 100 MCG tablet Commonly known as: SYNTHROID Take 100 mcg by mouth daily before breakfast. What changed: Another medication with the same name was removed. Continue taking this  medication, and follow the directions you see here.   Linzess 290 MCG Caps capsule Generic drug: linaclotide Take 1 capsule (290 mcg total) by mouth daily as needed (constipation). What changed:   when to take this  reasons to take this   loratadine 10 MG tablet Commonly known as: CLARITIN Take 10 mg by mouth daily.   magnesium oxide 400 (241.3 Mg) MG tablet Commonly known as: MAG-OX Take 1 tablet (400 mg total) by mouth 2 (two) times daily for 7 days.   methocarbamol 500 MG tablet Commonly known as: ROBAXIN Take 1 tablet (500 mg total) by mouth every 8 (eight) hours as needed for muscle spasms. What changed:   when to take this  reasons to take this   montelukast 10 MG tablet Commonly known as: SINGULAIR Take 10 mg by mouth at bedtime.   Movantik 25 MG Tabs tablet Generic drug: naloxegol oxalate Take 1 tablet (25 mg total) by mouth daily as needed (constipation). What changed: See the new instructions.   nystatin powder Commonly known as: MYCOSTATIN/NYSTOP Apply topically 2 (two) times daily. To effected areas   omeprazole 40 MG capsule Commonly known as: PRILOSEC Take 40 mg by mouth in the morning and at bedtime. TAKE 1 CAPSULE BY MOUTH TWICE DAILY   Oxycodone HCl 10 MG Tabs Take 10 mg by mouth every 6 (six) hours as needed (pain).   oxyCODONE-acetaminophen 10-325 MG tablet Commonly known as: PERCOCET Take 1 tablet by mouth every 6 (six) hours as needed for pain.   sodium bicarbonate 325 MG tablet Take 1 tablet (325 mg total) by mouth 2 (two) times daily for 3 days.   vancomycin 125 MG capsule Commonly known as: Vancocin HCl Take 1 capsule (125 mg total) by mouth 4 (four) times daily for 7 days.   Vitamin D3 25 MCG (1000 UT) Caps Take 1 capsule by mouth daily.        Brief H and P: For complete details please refer to admission H and P, but in brief *[name] a 86 y.o.WF PMHxFibromyalgia,Chronic pain syndrome,asthma, DDD C-spine,  essential HTN CKD stage IIIb(baseline Cr1.42on 02/25/2012).  Patient's daughter reported that she had fevers last week and was little confused.  Patient was found to have creatinine 4.6 (1.5 in November 2020) with BUN 80, bicarb 14, sodium 121, and normal potassium.  Patient was having watery diarrhea at [facility] and CT abd/pelvis findings consistent with colitis but no hydronephrosis.  She was started on IV fluids with bicarb -Patient was admitted for further work-up for hyponatremia, acute kidney injury, stool positive for C. difficile.   Hospital Course:  Acute C. difficile colitis -Continue oral vancomycin 125 mg 4 times daily for total 10 days, stop date on 4/16 - diarrhea is improving, leukocytosis resolved-Leukocytosis improving   Acute kidney injury on CKD stage IIIb -Presented with creatinine function of 4.6, was 1.5 in November 2020 -Patient was placed  on IV fluid hydration, encourage oral p.o. intake -Continue to hold Lasix, olmesartan, HCTZ -Creatinine improving, 1.4  Hyponatremia with metabolic acidosis -Likely due to profound dehydration, due to GI losses -Presented with sodium of 121, bicarb 14 with creatinine of 4.63 -Improving, sodium 135, bicarb improving 19, creatinine 1.4 at the time of discharge. - Discharging on sodium bicarb 325 mg twice daily for 3 days  Hypothyroidism -Continue Synthroid  Chronic pain syndrome -Currently stable   Asthma Currently stable, no wheezing  GERD Continue PPI  Day of Discharge S: Diarrhea improved, no fevers or chills, no acute issues overnight, hoping to go home today  BP 109/62 (BP Location: Right Arm)   Pulse 64   Temp 97.7 F (36.5 C) (Oral)   Resp 15   Ht 4\' 11"  (1.499 m)   Wt 54.5 kg   SpO2 100%   BMI 24.27 kg/m   Physical Exam: General: Alert and awake oriented x3 not in any acute distress. HEENT: anicteric sclera, pupils reactive to light and accommodation CVS: S1-S2 clear no murmur rubs or  gallops Chest: clear to auscultation bilaterally, no wheezing rales or rhonchi Abdomen: soft nontender, nondistended, normal bowel sounds Extremities: no cyanosis, clubbing or edema noted bilaterally Neuro: Cranial nerves II-XII intact, no focal neurological deficits    Get Medicines reviewed and adjusted: Please take all your medications with you for your next visit with your Primary MD  Please request your Primary MD to go over all hospital tests and procedure/radiological results at the follow up. Please ask your Primary MD to get all Hospital records sent to his/her office.  If you experience worsening of your admission symptoms, develop shortness of breath, life threatening emergency, suicidal or homicidal thoughts you must seek medical attention immediately by calling 911 or calling your MD immediately  if symptoms less severe.  You must read complete instructions/literature along with all the possible adverse reactions/side effects for all the Medicines you take and that have been prescribed to you. Take any new Medicines after you have completely understood and accept all the possible adverse reactions/side effects.   Do not drive when taking pain medications.   Do not take more than prescribed Pain, Sleep and Anxiety Medications  Special Instructions: If you have smoked or chewed Tobacco  in the last 2 yrs please stop smoking, stop any regular Alcohol  and or any Recreational drug use.  Wear Seat belts while driving.  Please note  You were cared for by a hospitalist during your hospital stay. Once you are discharged, your primary care physician will handle any further medical issues. Please note that NO REFILLS for any discharge medications will be authorized once you are discharged, as it is imperative that you return to your primary care physician (or establish a relationship with a primary care physician if you do not have one) for your aftercare needs so that they can reassess  your need for medications and monitor your lab values.   The results of significant diagnostics from this hospitalization (including imaging, microbiology, ancillary and laboratory) are listed below for reference.      Procedures/Studies:  CT ABDOMEN PELVIS WO CONTRAST  Result Date: 05/04/2019 CLINICAL DATA:  86 year old female with abdominal distension. EXAM: CT ABDOMEN AND PELVIS WITHOUT CONTRAST TECHNIQUE: Multidetector CT imaging of the abdomen and pelvis was performed following the standard protocol without IV contrast. COMPARISON:  CT abdomen pelvis dated 03/20/2017. FINDINGS: Evaluation of this exam is limited in the absence of intravenous contrast. Evaluation is also  limited due to respiratory motion artifact as well as streak artifact caused by patient's arms and metallic hip replacement. Lower chest: Bibasilar linear atelectasis/scarring. There is coronary vascular calcification. No intra-abdominal free air or free fluid. Hepatobiliary: The liver is grossly unremarkable. The gallbladder is poorly visualized. A small calcific focus in the region of the porta pedis as seen previously and may represent a small focus of vascular calcification or a small gallstone. Ultrasound may provide better evaluation if there is clinical concern for acute cholecystitis. Pancreas: The pancreas is poorly visualized. No definite active inflammatory changes. Spleen: Normal in size without focal abnormality. Adrenals/Urinary Tract: The adrenal glands are suboptimally visualized. There is no hydronephrosis or nephrolithiasis on either side. There is mild parenchyma atrophy and cortical irregularity/scarring. The urinary bladder is grossly unremarkable. Stomach/Bowel: There is loose stool throughout the colon compatible with diarrheal state. Correlation with clinical exam and stool cultures recommended. Mild thickened appearance of the colon concerning for colitis. No bowel obstruction. The appendix is not visualized  with certainty. No inflammatory changes identified in the right lower quadrant. Vascular/Lymphatic: Advanced aortoiliac atherosclerotic disease. The IVC is grossly unremarkable. No definite adenopathy. Reproductive: Hysterectomy. Other: Small fat containing umbilical hernia. Musculoskeletal: Osteopenia with degenerative changes of the spine, scoliosis, and lower lumbar posterior fusion hardware. Bilateral hip replacements. No acute osseous pathology. IMPRESSION: 1. Diarrheal state with findings of colitis. Correlation with clinical exam and stool cultures recommended. No bowel obstruction. 2. No hydronephrosis or nephrolithiasis. 3. Aortic Atherosclerosis (ICD10-I70.0). Electronically Signed   By: Anner Crete M.D.   On: 05/04/2019 18:03   DG Chest 2 View  Result Date: 05/04/2019 CLINICAL DATA:  86 year old female with fever and pain. EXAM: CHEST - 2 VIEW COMPARISON:  Chest radiograph dated 12/16/2013. FINDINGS: No focal consolidation, pleural effusion, or pneumothorax. The cardiac silhouette is within normal limits. Atherosclerotic calcification of the aorta. The aorta is tortuous. There is osteopenia with degenerative changes of the spine and scoliosis. No definite acute osseous pathology. Partially visualized lumbar fusion hardware and right shoulder arthroplasty. IMPRESSION: No active cardiopulmonary disease. Electronically Signed   By: Anner Crete M.D.   On: 05/04/2019 18:33   DG Cervical Spine 2 or 3 views  Result Date: 05/05/2019 CLINICAL DATA:  Acute pain.  No injury. EXAM: CERVICAL SPINE - 2-3 VIEW COMPARISON:  None. FINDINGS: Severe advanced degenerative facet disease bilaterally. 4 mm of anterolisthesis of C4 on C5. Degenerative disc disease in the lower cervical spine. No fracture. Prevertebral soft tissues are normal. IMPRESSION: Advanced degenerative facet disease and mild degenerative disc disease. Grade 1 anterolisthesis at C4-5 likely related to facet disease. No fracture.  Electronically Signed   By: Rolm Baptise M.D.   On: 05/05/2019 23:22   DG Thoracic Spine 2 View  Result Date: 05/04/2019 CLINICAL DATA:  Pain EXAM: THORACIC SPINE 2 VIEWS COMPARISON:  None. FINDINGS: Severe thoracolumbar scoliosis. Advanced diffuse degenerative disc disease. No acute bony abnormality. No fracture. IMPRESSION: Severe scoliosis and advanced degenerative changes. No acute findings. Electronically Signed   By: Rolm Baptise M.D.   On: 05/04/2019 18:33      LAB RESULTS: Basic Metabolic Panel: Recent Labs  Lab 05/07/19 0517 05/07/19 0517 05/08/19 0515  NA 132*  --  135  K 3.6  --  3.6  CL 106  --  107  CO2 18*  --  19*  GLUCOSE 95  --  95  BUN 42*  --  29*  CREATININE 1.54*  --  1.40*  CALCIUM 7.8*  --  8.0*  MG 1.4*   < > 1.4*  PHOS 2.5  --   --    < > = values in this interval not displayed.   Liver Function Tests: Recent Labs  Lab 05/06/19 0542 05/07/19 0517  AST 23 16  ALT 17 13  ALKPHOS 90 67  BILITOT 0.6 0.6  PROT 6.2* 4.8*  ALBUMIN 2.8* 2.2*   Recent Labs  Lab 05/04/19 1706  LIPASE 72*   No results for input(s): AMMONIA in the last 168 hours. CBC: Recent Labs  Lab 05/07/19 0517 05/07/19 0517 05/08/19 0515  WBC 14.6*  --  8.6  NEUTROABS 10.9*  --   --   HGB 8.8*  --  8.2*  HCT 26.7*  --  25.6*  MCV 86.7   < > 86.8  PLT 276  --  268   < > = values in this interval not displayed.   Cardiac Enzymes: No results for input(s): CKTOTAL, CKMB, CKMBINDEX, TROPONINI in the last 168 hours. BNP: Invalid input(s): POCBNP CBG: No results for input(s): GLUCAP in the last 168 hours.     Disposition and Follow-up: Discharge Instructions    Diet - low sodium heart healthy   Complete by: As directed    Discharge instructions   Complete by: As directed    Stay hydrated, please stop Lasix, Benicar HCTZ.   Increase activity slowly   Complete by: As directed        DISPOSITION: [place]    Chesley Noon, MD. Schedule an appointment as soon as possible for a visit in 2 week(s).   Specialty: Family Medicine Contact information: [place] [address] [phone]        [facility] Follow up.   Why: [place] nursing/physical therpay/occupational therapy Contact information: [address] [place] [address] [phone]            Time coordinating discharge:  35-minute  Signed:   Estill Cotta M.D. [facility] 05/08/2019, 11:51 AM

## 2019-05-08 NOTE — TOC Transition Note (Signed)
Transition of Care [facility]) - CM/SW Discharge Note   Patient Details  Name: Penny Perez MRN: [id number] Date of Birth: August 08, 1932  [facility] Care [facility] CM/SW Contact:  Dessa Phi, RN Phone Number: 05/08/2019, 10:36 AM   Clinical Narrative: Spoke to patient about d/c plans-home w/continued HHC-[facility] already active HHRNPT-added OT. Left vm w/dtr Karlene Einstein about d/c plans. No further CM needs.      Final next level of care: Home w Home Health Services Barriers to Discharge: No Barriers Identified   Patient Goals and CMS Choice Patient states their goals for this hospitalization and ongoing recovery are:: go home      Discharge Placement                       Discharge Plan and Services   Discharge Planning Services: CM Consult                      HH Arranged: RN, PT, OT [facility] Agency: [place] Date [place]: 05/07/19 Time [place]: [id number] Representative spoke with at [place]: [place] Determinants of Health ([place]) Interventions     Readmission Risk Interventions No flowsheet data found.

## 2019-05-08 NOTE — Evaluation (Signed)
Occupational Therapy Evaluation Patient Details Name: Penny Perez MRN: [id number] DOB: 10-Jun-1932 Today's Date: 05/08/2019    History of Present Illness Patient is 86 y.o. female with PMH significant for Fibromyalgia, Chronic pain syndrome, asthma, DDD C-spine, essential HTN CKD stage IIIb. Patient's daughter reported that she had fevers last week and was little confused. Patient admitted for hyponatremia, AKI, and C. Difficile.   Clinical Impression   PTA patient independent. Admitted for above and limited by problem list below, including impaired balance, decreased activity tolerance, decreased safety awareness and problem solving, and generalized weakness. Patient currently requires min guard assist for transfers and in room mobility using RW, supervision to mod assist for ADLs.  Requires min cueing for safety awareness and problem solving throughout session.  Daughter is present and supportive, plans to provide 24/7 support initially at dc. Discussed benefits of 3:1 commode for urgency and safety with toileting needs, but pt declines. Patient plans to dc home today, and recommend continued OT services after dc at [facility] level to maximize return to independence with ADLs, mobility.     Follow Up Recommendations  Home health OT;Supervision/Assistance - 24 hour    Equipment Recommendations  None recommended by OT    Recommendations for Other Services       Precautions / Restrictions Precautions Precautions: Fall Precaution Comments: enteric precautions Restrictions Weight Bearing Restrictions: No      Mobility Bed Mobility Overal bed mobility: Needs Assistance Bed Mobility: Supine to Sit     Supine to sit: Supervision;HOB elevated     General bed mobility comments: increased time and effort but no physical assist required   Transfers Overall transfer level: Needs assistance Equipment used: Rolling walker (2 wheeled) Transfers: Sit to/from Stand Sit to Stand: Min  guard         General transfer comment: cueing for hand placement and technique, min guard for safety and balance     Balance Overall balance assessment: Needs assistance Sitting-balance support: Feet supported Sitting balance-Leahy Scale: Good     Standing balance support: Bilateral upper extremity supported;During functional activity Standing balance-Leahy Scale: Poor Standing balance comment: reliant on BUE support                            ADL either performed or assessed with clinical judgement   ADL Overall ADL's : Needs assistance/impaired     Grooming: Set up;Sitting   Upper Body Bathing: Set up;Sitting   Lower Body Bathing: Moderate assistance;Sit to/from stand   Upper Body Dressing : Minimal assistance;Sitting   Lower Body Dressing: Moderate assistance;Sit to/from stand Lower Body Dressing Details (indicate cue type and reason): requires assist for socks/shoes, min guard sit to stand  Toilet Transfer: Min guard;Ambulation;RW Toilet Transfer Details (indicate cue type and reason): simulated to recliner          Functional mobility during ADLs: Min guard;Rolling walker;Cueing for safety General ADL Comments: pt limited by decreased activity tolerance, weakness, and impaired balance; some decreased safety awareness      Vision         Perception     Praxis      Pertinent Vitals/Pain Pain Assessment: Faces Faces Pain Scale: Hurts little more Pain Location: L shoulder  Pain Descriptors / Indicators: Discomfort Pain Intervention(s): Limited activity within patient's tolerance;Monitored during session;Repositioned     Hand Dominance Right   Extremity/Trunk Assessment Upper Extremity Assessment Upper Extremity Assessment: Generalized weakness   Lower Extremity Assessment Lower Extremity  Assessment: Defer to PT evaluation   Cervical / Trunk Assessment Cervical / Trunk Assessment: Kyphotic   Communication Communication Communication:  HOH   Cognition Arousal/Alertness: Awake/alert Behavior During Therapy: WFL for tasks assessed/performed Overall Cognitive Status: Impaired/Different from baseline Area of Impairment: Problem solving;Awareness;Safety/judgement                         Safety/Judgement: Decreased awareness of safety;Decreased awareness of deficits Awareness: Emergent Problem Solving: Slow processing;Requires verbal cues General Comments: some decreased safety awareness, verbal cueing for problem sovling during ADLs    General Comments  daughter present and supportive     Exercises     Shoulder Instructions      Home Living Family/patient expects to be discharged to:: Private residence Living Arrangements: Alone Available Help at Discharge: Family;Available 24 hours/day(daughter planning to stay with pt for 1 week 24/7) Type of Home: House Home Access: Stairs to enter [employer] of Steps: 4 Entrance Stairs-Rails: Left Home Layout: Multi-level;Laundry or work area in Building surveyor of Steps: 16(16 up to 2nd floor and 16 down to basement ) Alternate Level Stairs-Rails: Left Bathroom Shower/Tub: Walk-in shower;Tub/shower unit   Bathroom Toilet: Handicapped height Bathroom Accessibility: Yes   Home Equipment: Walker - 2 wheels;Cane - single point;Cane - quad;Shower seat - built in;Grab bars - tub/shower   Additional Comments: pt reports she has a 6050 sqft home. She lives mainly on teh first floor and has a walk in shower and whirlpool tub on the main floor. Pt reports she cannot get in that tub and does nto like teh walk in shower; so she goes upstairs to the second floor to use the tub/shower combo. Pt has a shower seat and grab bars in the walk in and only has a grab bar in the 2nd floor shower.       Prior Functioning/Environment Level of Independence: Independent;Needs assistance  Gait / Transfers Assistance Needed: pt has been mobilizing with no  device and typicall drives to grocery store and does her own shopping ADL's / Homemaking Assistance Needed: pt is independent with showering and dressing. her daughter has been helping more and more over teh last year with cooking/house cleaning like vacumming.             OT Problem List: Decreased strength;Decreased activity tolerance;Impaired balance (sitting and/or standing);Decreased cognition;Decreased safety awareness;Decreased knowledge of use of DME or AE;Decreased knowledge of precautions      OT Treatment/Interventions:      OT Goals(Current goals can be found in the care plan section) Acute Rehab OT Goals Patient Stated Goal: to get back to normal independence OT Goal Formulation: With patient Time For Goal Achievement: 05/22/19 Potential to Achieve Goals: Good  OT Frequency:     Barriers to D/C:            Co-evaluation              AM-PAC OT "6 Clicks" Daily Activity     Outcome Measure Help from another person eating meals?: None Help from another person taking care of personal grooming?: A Little Help from another person toileting, which includes using toliet, bedpan, or urinal?: A Little Help from another person bathing (including washing, rinsing, drying)?: A Lot Help from another person to put on and taking off regular upper body clothing?: A Little Help from another person to put on and taking off regular lower body clothing?: A Lot 6 Click Score: 17   End  of Session Equipment Utilized During Treatment: Surveyor, mining Communication: Mobility status;Other (comment)(pt ready for dc )  Activity Tolerance: Patient tolerated treatment well Patient left: in chair;with call bell/phone within reach;with nursing/sitter in room  OT Visit Diagnosis: Other abnormalities of gait and mobility (R26.89);Muscle weakness (generalized) (M62.81)                Time: 7972-8206 OT Time Calculation (min): 26 min Charges:  OT General Charges $OT Visit: 1  Visit OT Evaluation $OT Eval Moderate Complexity: 1 Mod OT Treatments $Self Care/Home Management : 8-22 mins  Jolaine Artist, OT Acute Rehabilitation Services Pager [phone] Office [phone]   Penny Perez 05/08/2019, 1:25 PM

## 2019-05-08 NOTE — Plan of Care (Signed)
  Problem: Education: Goal: Knowledge of General Education information will improve Description: Including pain rating scale, medication(s)/side effects and non-pharmacologic comfort measures Outcome: Completed/Met   Problem: Health Behavior/Discharge Planning: Goal: Ability to manage health-related needs will improve Outcome: Completed/Met   Problem: Clinical Measurements: Goal: Ability to maintain clinical measurements within normal limits will improve Outcome: Completed/Met Goal: Will remain free from infection Outcome: Completed/Met Goal: Diagnostic test results will improve Outcome: Completed/Met Goal: Cardiovascular complication will be avoided Outcome: Completed/Met   Problem: Nutrition: Goal: Adequate nutrition will be maintained Outcome: Completed/Met   Problem: Activity: Goal: Risk for activity intolerance will decrease Outcome: Completed/Met   Problem: Coping: Goal: Level of anxiety will decrease Outcome: Completed/Met   Problem: Elimination: Goal: Will not experience complications related to bowel motility Outcome: Completed/Met   Problem: Pain Managment: Goal: General experience of comfort will improve Outcome: Completed/Met   Problem: Safety: Goal: Ability to remain free from injury will improve Outcome: Completed/Met   Problem: Skin Integrity: Goal: Risk for impaired skin integrity will decrease Outcome: Completed/Met   Problem: Skin Integrity: Goal: Risk for impaired skin integrity will decrease Outcome: Completed/Met   Problem: Education: Goal: Knowledge of disease and its progression will improve Outcome: Completed/Met   Problem: Fluid Volume: Goal: Compliance with measures to maintain balanced fluid volume will improve Outcome: Completed/Met   Problem: Health Behavior/Discharge Planning: Goal: Ability to manage health-related needs will improve Outcome: Completed/Met   Problem: Nutritional: Goal: Ability to make healthy dietary  choices will improve Outcome: Completed/Met   Problem: Clinical Measurements: Goal: Complications related to the disease process, condition or treatment will be avoided or minimized Outcome: Completed/Met

## 2019-05-13 NOTE — Progress Notes (Signed)
Penny Perez ([id number]) Visit Report for 03/11/2019 Arrival Information Details Patient Name: Date of Service: Penny Perez, Penny Perez 03/11/2019 3:45 PM Medical Record [id number] Patient Account Number: [id number] Date of Birth/Sex: Treating RN: April 07, 1932 (86 y.o. [name] Primary Care Lorma Heater: [name] Other Clinician: Referring Mico Spark: Treating Tanaysha Alkins/Extender:Stone III, Lemar Livings, Esperanza Richters in Treatment: 66 Visit Information History Since Last Visit Added or deleted any medications: No Patient Arrived: Ambulatory Any new allergies or adverse reactions: No Arrival Time: 16:02 Had a fall or experienced change in No Accompanied By: self activities of daily living that may affect Transfer Assistance: None risk of falls: Patient Identification Verified: Yes Signs or symptoms of abuse/neglect since last No Secondary Verification Process Yes visito Completed: Hospitalized since last visit: No Patient Requires Transmission-Based No Implantable device outside of the clinic excluding No Precautions: cellular tissue based products placed in the center Patient Has Alerts: No since last visit: Has Dressing in Place as Prescribed: Yes Pain Present Now: Yes Electronic Signature(s) Signed: 05/13/2019 9:23:26 AM By: Sandre Kitty Entered By: Sandre Kitty on 03/11/2019 16:02:28 -------------------------------------------------------------------------------- Encounter Discharge Information Details Patient Name: Date of Service: Penny Perez 03/11/2019 3:45 PM Medical Record [id number] Patient Account Number: [id number] Date of Birth/Sex: Treating RN: [birth year] (86 y.o. [name] Primary Care Giada Schoppe: [name] Other Clinician: Referring Mikaella Escalona: Treating Cola Gane/Extender:Stone III, Lemar Livings, Esperanza Richters in Treatment: 14 Encounter Discharge Information Items Post Procedure Vitals Discharge Condition:  Stable Temperature (F): 98.5 Ambulatory Status: Ambulatory Pulse (bpm): 77 Discharge Destination: Home Respiratory Rate (breaths/min): 18 Transportation: Private Auto Blood Pressure (mmHg): 139/48 Accompanied By: self Schedule Follow-up Appointment: Yes Clinical Summary of Care: Patient Declined Electronic Signature(s) Signed: 03/11/2019 4:53:37 PM By: Carlene Coria RN Entered By: Carlene Coria on 03/11/2019 16:46:37 -------------------------------------------------------------------------------- [place] Details Patient Name: Date of Service: Penny Perez 03/11/2019 3:45 PM Medical Record [id number] Patient Account Number: [id number] Date of Birth/Sex: Treating RN: August 02, 1932 (86 y.o. [name] Primary Care Anniston Nellums: [name] Other Clinician: Referring Percilla Tweten: Treating Alanzo Lamb/Extender:Stone III, Lemar Livings, Esperanza Richters in Treatment: 14 Active Inactive Malignancy/Atypical Etiology Nursing Diagnoses: Knowledge deficit related to disease process and management of atypical ulcer etiology Goals: Patient/caregiver will verbalize understanding of disease process and disease management of atypical ulcer etiology Date Initiated: 12/03/2018 Target Resolution Date: 04/08/2019 Goal Status: Active Interventions: Assess patient and family medical history for signs and symptoms of malignancy/atypical etiology upon admission Provide education on atypical ulcer etiologies Notes: Wound/Skin Impairment Nursing Diagnoses: Impaired tissue integrity Knowledge deficit related to ulceration/compromised skin integrity Goals: Patient/caregiver will verbalize understanding of skin care regimen Date Initiated: 12/03/2018 Target Resolution Date: 04/08/2019 Goal Status: Active Ulcer/skin breakdown will have a volume reduction of 30% by week 4 Date Initiated: 12/03/2018 Date Inactivated: 12/31/2018 Target Resolution Date: 12/31/2018 Unmet Reason:  tunnel Goal Status: Unmet unroofed Ulcer/skin breakdown will have a volume reduction of 50% by week 8 Date Inactivated: 01/28/2019 Target Resolution Date Initiated: 12/31/2018 Date: 01/28/2019 Unmet Reason: multiple Goal Status: Unmet comorbidities Interventions: Assess patient/caregiver ability to obtain necessary supplies Assess patient/caregiver ability to perform ulcer/skin care regimen upon admission and as needed Assess ulceration(s) every visit Treatment Activities: Skin care regimen initiated : 12/03/2018 Topical wound management initiated : 12/03/2018 Notes: Electronic Signature(s) Signed: 03/11/2019 5:39:41 PM By: Baruch Gouty RN, BSN Entered By: Baruch Gouty on 03/11/2019 16:14:30 -------------------------------------------------------------------------------- Pain Assessment Details Patient Name: Date of Service: Penny Perez 03/11/2019 3:45 PM Medical Record [id number] Patient Account Number: [id number] Date of Birth/Sex: Treating RN: Aug 09, 1932 (86 y.o. F) Baruch Gouty  Primary Care Gavina Dildine: [name] Other Clinician: Referring Ivory Maduro: Treating Aleaya Latona/Extender:Stone III, Lemar Livings, Esperanza Richters in Treatment: 14 Active Problems Location of Pain Severity and Description of Pain Patient Has Paino Yes Site Locations Rate the pain. Current Pain Level: 6 Pain Management and Medication Current Pain Management: Electronic Signature(s) Signed: 03/11/2019 5:39:41 PM By: Baruch Gouty RN, BSN Signed: 05/13/2019 9:23:26 AM By: Sandre Kitty Entered By: Sandre Kitty on 03/11/2019 16:04:12 -------------------------------------------------------------------------------- Patient/Caregiver Education Details Patient Name: Date of Service: Penny Perez 2/10/2021andnbsp3:45 PM Medical Record Patient Account Number: [id number] Number: Treating RN: Baruch Gouty Date of Birth/Gender: 1932-02-03 (86 y.o. F) Other  Clinician: Primary Care Physician: Earmon Phoenix Referring Physician: Physician/Extender: Lorelle Gibbs in Treatment: 14 Education Assessment Education Provided To: Patient Education Topics Provided Wound/Skin Impairment: Methods: Explain/Verbal Responses: Reinforcements needed, State content correctly [employer]) Signed: 03/11/2019 5:39:41 PM By: Baruch Gouty RN, BSN Entered By: Baruch Gouty on 03/11/2019 16:15:11 -------------------------------------------------------------------------------- Wound Assessment Details Patient Name: Date of Service: BRISEIDA, GITTINGS 03/11/2019 3:45 PM Medical Record [id number] Patient Account Number: [id number] Date of Birth/Sex: Treating RN: 1932/03/29 (86 y.o. [name] Primary Care Isack Lavalley: [name] Other Clinician: Referring Anderia Lorenzo: Treating Karan Inclan/Extender:Stone III, Lemar Livings, Esperanza Richters in Treatment: 14 Wound Status Wound Number: 1 Primary Atypical Etiology: Wound Location: Right Gluteus Wound Open Wounding Event: Gradually Appeared Status: Date Acquired: 07/30/2018 Comorbid Cataracts, Asthma, Hypertension, Weeks Of Treatment: 14 Weeks Of Treatment: 14 History: Osteoarthritis Clustered Wound: No Photos Wound Measurements Length: (cm) 0.3 % Reduct Width: (cm) 0.4 % Reduct Depth: (cm) 1.2 Epitheli Area: (cm) 0.094 Tunneli Volume: (cm) 0.113 Undermi Wound Description Full Thickness Without Exposed Support Foul Odo Classification: Structures Slough/F Wound Flat and Intact Margin: Exudate Medium Amount: Exudate Purulent Type: Exudate yellow, brown, green Color: Wound Bed Granulation Amount: Large (67-100%) Granulation Quality: Pink Fascia E Necrotic Amount: Small (1-33%) Fat Laye Necrotic Quality: Adherent Slough Tendon E Muscle E Joint Ex Bone Exp r After Cleansing: No ibrino Yes Exposed Structure xposed: No r  (Subcutaneous Tissue) Exposed: Yes xposed: No xposed: No posed: No osed: No ion in Area: 91.2% ion in Volume: 86.8% alization: Small (1-33%) ng: No ning: No Electronic Signature(s) Signed: 03/12/2019 4:30:47 PM By: Mikeal Hawthorne EMT/HBOT Signed: 03/13/2019 5:30:09 PM By: Baruch Gouty RN, BSN Previous Signature: 03/11/2019 5:39:41 PM Version By: Baruch Gouty RN, BSN Entered By: Mikeal Hawthorne on 03/12/2019 14:30:13 -------------------------------------------------------------------------------- Vitals Details Patient Name: Date of Service: FUMIE, FIALLO 03/11/2019 3:45 PM Medical Record [id number] Patient Account Number: [id number] Date of Birth/Sex: Treating RN: 12-26-1932 (86 y.o. [name] Primary Care Rakim Moone: [name] Other Clinician: Referring Brittanie Dosanjh: Treating Kaiden Dardis/Extender:Stone III, Lemar Livings, Esperanza Richters in Treatment: 14 Vital Signs Time Taken: 14:03 Temperature (F): 98.5 Height (in): 60 Pulse (bpm): 77 Weight (lbs): 115 Respiratory Rate (breaths/min): 18 Body Mass Index (BMI): 22.5 Blood Pressure (mmHg): 139/48 Reference Range: 80 - 120 mg / dl Electronic Signature(s) Signed: 05/13/2019 9:23:26 AM By: Sandre Kitty Entered By: Sandre Kitty on 03/11/2019 16:04:08

## 2019-06-03 ENCOUNTER — Other Ambulatory Visit: Payer: Self-pay

## 2019-06-03 ENCOUNTER — Encounter (HOSPITAL_BASED_OUTPATIENT_CLINIC_OR_DEPARTMENT_OTHER): Payer: Medicare Other | Attending: Physician Assistant | Admitting: Physician Assistant

## 2019-06-03 DIAGNOSIS — M1990 Unspecified osteoarthritis, unspecified site: Secondary | ICD-10-CM | POA: Insufficient documentation

## 2019-06-03 DIAGNOSIS — L97822 Non-pressure chronic ulcer of other part of left lower leg with fat layer exposed: Secondary | ICD-10-CM | POA: Insufficient documentation

## 2019-06-03 DIAGNOSIS — I872 Venous insufficiency (chronic) (peripheral): Secondary | ICD-10-CM | POA: Diagnosis not present

## 2019-06-03 DIAGNOSIS — I10 Essential (primary) hypertension: Secondary | ICD-10-CM | POA: Insufficient documentation

## 2019-06-03 DIAGNOSIS — Z96643 Presence of artificial hip joint, bilateral: Secondary | ICD-10-CM | POA: Insufficient documentation

## 2019-06-03 DIAGNOSIS — M797 Fibromyalgia: Secondary | ICD-10-CM | POA: Diagnosis not present

## 2019-06-03 DIAGNOSIS — S31819A Unspecified open wound of right buttock, initial encounter: Secondary | ICD-10-CM | POA: Insufficient documentation

## 2019-06-03 DIAGNOSIS — J45909 Unspecified asthma, uncomplicated: Secondary | ICD-10-CM | POA: Insufficient documentation

## 2019-06-04 NOTE — Progress Notes (Signed)
[name], [name] ([id number]) Visit Report for 06/03/2019 Chief Complaint Document Details Patient Name: Date of Service: Penny Perez, Penny Perez [facility] 06/03/2019 12:30 PM Medical Record Number: [id number] Patient Account Number: [id number] Date of Birth/Sex: Treating RN: [birth year] (86 y.o. [name] Primary Care Provider: [name] Other Clinician: Referring Provider: Treating Provider/Extender: [name] in Treatment: 26 Information Obtained from: Patient Chief Complaint Right buttock ulcer and left leg ulcer Electronic Signature(s) Signed: 06/03/2019 2:07:43 PM By: Worthy Keeler PA-C Previous Signature: 06/03/2019 1:27:07 PM Version By: Worthy Keeler PA-C Entered By: Worthy Keeler on 06/03/2019 14:07:42 -------------------------------------------------------------------------------- HPI Details Patient Name: Date of Service: Penny Perez, Penny Perez 06/03/2019 12:30 PM Medical Record Number: [id number] Patient Account Number: [id number] Date of Birth/Sex: Treating RN: 1932/07/04 (86 y.o. [name] Primary Care Provider: [name] Other Clinician: Referring Provider: Treating Provider/Extender: [name] in Treatment: 26 History of Present Illness HPI Description: 12/03/2018 on evaluation today patient appears for initial inspection here in our clinic concerning an issue that she has been having a light having in the right gluteal region with an ulcer which initially she describes as having been a knot which when she went to see the dermatologist was biopsied and this was on 08/26/2018. Subsequently that revealed what appeared to be an inflamed ulcer with granulation tissue. Fortunately there was no signs of active infection based on what they saw on pathology. The patient states that since the biopsy she has been having a lot of pain. Fortunately there is no signs of active infection at this time no fever chills  noted. She does have a history of hypertension but really no major medical problems significant otherwise although she does have a lot of allergies including allergies to [facility] as well as antibiotics. In fact she has some ulcers in her mouth secondary to having been prescribed an antibiotic for the current ulcer. She states she has been having trouble sleeping due to the pain in the ulcer area. 12/10/2018 on evaluation today patient's wound actually showed signs of improvement as far as the loosening up of the necrotic tissue is concerned at this point. I do believe that that has done excellent at this point I think that we need to try and clear away some of the necrotic tissue in the base of the wound I discussed that with the patient as well. Fortunately she is not having any significant pain and states the Santyl did not burn although in the past she notes that it did cause her some trouble with the ulcer on her leg causing some burning. No fevers, chills, nausea, vomiting, or diarrhea. 12/17/2018 on evaluation today patient appears to be doing well with regard to her wound. This does not appear to be any more deep than it was after the last evaluation and debridement last week. With that being said there are some necrotic tissue on the surface of the wound which is can require some sharp debridement at this point. Fortunately there is no evidence of active infection at this time which is good news. No fevers, chills, nausea, vomiting, or diarrhea. 12/31/2018 on evaluation today patient appears to be doing a little better in regard to the overall appearance of her wound currently. She is tolerating the dressing changes without complication. Fortunately there is no signs of active infection at this time. With that being said she still has some necrotic tissue in the base of the wound that still needs to be removed  this is very tough however and I was not able to debrided away last week. She really  does not want me to perform any debridement today either due to how bad it hurt after I did this last time. With that being said I think we may be able to switch the dressing to something else to try to help with clearing this out a little better. 01/14/2019 upon evaluation today patient actually appears to be doing better with regard to her wound. She is showing signs of new granulation tissue and I do feel like that this is starting to improve. That is good news. Fortunately there is no evidence of active infection at this point. No fevers, chills, nausea, vomiting, or diarrhea. 12/30-Patient returns at 2 weeks, she does complain of some discomfort at night in the right gluteal area, she is obviously active and stays on her feet and not in a chair during the day. Denies any fevers chills shakes. Complains that the area on the ulcer feels wet fairly often 02/11/2019 on evaluation today patient appears to be doing okay with regard to her wound. She has been tolerating the dressing changes without complication. Fortunately there is no signs of active infection at this time. No fevers, chills, nausea, vomiting, or diarrhea. She does note however that she is having issues financially with having to come as frequently even as every other week. She states that she is not sure she is going to be able to continue to do so. Fortunately there is no signs of anything doing poorly I think we may be able to spread this out for her in order to help alleviate some of this especially in light of the fact that she does have home health coming out. 03/11/2019 upon evaluation today patient appears to be doing really roughly the same with regard to her wound. This is measuring close to where it was previous although may be slightly smaller. Fortunately there is no evidence of active infection at this time obviously. With that being said this is not healing quite as quickly as I would like to see therefore I may actually  perform a repeat culture today to see if there is anything that shows up at this point. Fortunately there is no sign of active infection systemically for certain. The patient is somewhat frustrated with how long this is taking I completely understand your frustration. Nonetheless I explained that wounds like this often do take a very long time to heal. 04/08/2019 upon evaluation today patient appears to be doing well with regard to her wound. This is not completely healed but does seem to be somewhat better. They have been using silver alginate at this point which is great news and overall the patient does seem to be making some progress. With that being said she did culture positive for MRSA unfortunately she is allergic to pretty much everything. I did want to discuss with her and she did mention today that she be willing to try the linezolid she is never taken this before. Hopefully it will not cause any problems for her. 06/03/2019 upon evaluation today patient appears to be doing well with regard to her wound. She does have a new wound on her left lower extremity that occurred as a result of swelling of her legs when she was in the hospital he took off of her fluid pills and this made some problem here. With that being said fortunately this seems to be getting better and the patient states  she is really not worried about this she has been putting antibiotic ointment on this at home and covering this with a drain seen which seems to be doing fairly well now that she is not draining as much. At one point her daughter was having to change the dressings much more frequently. Fortunately that has improved. Her swelling is better which is also good news. The patient was in the hospital from 05/04/2019 through 05/08/2019 where she had IV antibiotic therapy with vancomycin due to a C. difficile infection. She is better in regard to the infection but still having a lot of weakness and tiredness in general. She did  get the gentamicin and they have been using this at home as far as dressing changes are concerned. She tells me the hospital did not look at her wound at all during the time that she was there. Electronic Signature(s) Signed: 06/03/2019 2:08:18 PM By: Worthy Keeler PA-C Entered By: Worthy Keeler on 06/03/2019 14:08:17 -------------------------------------------------------------------------------- Physical Exam Details Patient Name: Date of Service: Penny Perez, Penny Perez [facility] 06/03/2019 12:30 PM Medical Record Number: [id number] Patient Account Number: [id number] Date of Birth/Sex: Treating RN: Aug 19, 1932 (86 y.o. [name] Primary Care Provider: [name] Other Clinician: Referring Provider: Treating Provider/Extender: [name] in Treatment: 48 Constitutional Well-nourished and well-hydrated in no acute distress. Respiratory normal breathing without difficulty. Psychiatric this patient is able to make decisions and demonstrates good insight into disease process. Alert and Oriented x 3. pleasant and cooperative. Notes Upon inspection patient's wound bed actually showed signs of good granulation at this time. Fortunately there is no signs of active infection which is great news. This is measuring a lot smaller as compared to last visit. Electronic Signature(s) Signed: 06/03/2019 2:08:47 PM By: Worthy Keeler PA-C Entered By: Worthy Keeler on 06/03/2019 14:08:46 -------------------------------------------------------------------------------- Physician Orders Details Patient Name: Date of Service: VALINA, MAES [facility] 06/03/2019 12:30 PM Medical Record Number: [id number] Patient Account Number: [id number] Date of Birth/Sex: Treating RN: May 15, 1932 (86 y.o. 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 Dressing Change Frequency Wound #1 Right Gluteus Change dressing three times week. Wound Cleansing Wound #1 Right Gluteus Clean wound with Normal Saline. Wound #2 Left,Lateral Lower Leg May shower and wash wound with soap and water. Primary Wound Dressing Wound #1 Right Gluteus Calcium A lginate with Silver Other: - gentamicin ointment to base of wound then pack lightly with silver alginate Wound #2 Left,Lateral Lower Leg Other: - triple antibiotic ointment Secondary Dressing Wound #1 Right Gluteus Foam Border Wound #2 Left,Lateral Lower Leg Foam Border - or large bandaid Edema Control Elevate legs to the level of the heart or above for 30 minutes daily and/or when sitting, a frequency of: - throughout the day Exercise regularly [place] [facility] - [facility] -discontinue wound care, pt's daughter to do dressing changes Electronic Signature(s) Signed: 06/03/2019 5:03:53 PM By: Baruch Gouty RN, BSN Signed: 06/03/2019 6:28:26 PM By: Worthy Keeler PA-C Entered By: Baruch Gouty on 06/03/2019 14:07:16 -------------------------------------------------------------------------------- Problem List Details Patient Name: Date of Service: KEILA, TURAN [facility] 06/03/2019 12:30 PM Medical Record Number: [id number] Patient Account Number: [id number] Date of  Birth/Sex: Treating RN: 07-11-1932 (86 y.o. 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 12/03/2018 No Yes I10 Essential (primary) hypertension 12/03/2018 No Yes I87.2 Venous insufficiency (chronic) (peripheral) 06/03/2019 No Yes L97.822 Non-pressure chronic ulcer of other part of left lower leg with fat layer exposed5/05/2019 No Yes Inactive Problems Resolved Problems Electronic Signature(s) Signed: 06/03/2019 2:07:27 PM By: Worthy Keeler PA-C Previous Signature: 06/03/2019 1:27:02 PM Version By: Worthy Keeler PA-C Entered By: Worthy Keeler on 06/03/2019 14:07:27 -------------------------------------------------------------------------------- Progress Note Details Patient Name: Date of Service: Penny Perez, Penny Perez 06/03/2019 12:30 PM Medical Record Number: [id number] Patient Account Number: [id number] Date of Birth/Sex: Treating RN: [birth year] (86 y.o. [name] Primary Care Provider: [name] Other Clinician: Referring Provider: Treating Provider/Extender: [name] in Treatment: 26 Subjective Chief Complaint Information obtained from Patient Right buttock ulcer and left leg ulcer History of Present Illness (HPI) 12/03/2018 on evaluation today patient appears for initial inspection here in our clinic concerning an issue that she has been having a light having in the right gluteal region with an ulcer which initially she describes as having been a knot which when she went to see the dermatologist was biopsied and this was on 08/26/2018. Subsequently that revealed what appeared to be an inflamed ulcer with granulation tissue. Fortunately there was no signs of active infection based on what they saw on pathology. The patient states that since the biopsy she has been having a lot of pain. Fortunately there is no signs of active infection at this time no fever chills noted. She does have a history of  hypertension but really no major medical problems significant otherwise although she does have a lot of allergies including allergies to [facility] as well as antibiotics. In fact she has some ulcers in her mouth secondary to having been prescribed an antibiotic for the current ulcer. She states she has been having trouble sleeping due to the pain in the ulcer area. 12/10/2018 on evaluation today patient's wound actually showed signs of improvement as far as the loosening up of the necrotic tissue is concerned at this point. I do believe that that has done excellent at this point I think that we need to try and clear away some of the necrotic tissue in the base of the wound I discussed that with the patient as well. Fortunately she is not having any significant pain and states the Santyl did not burn although in the past she notes that it did cause her some trouble with the ulcer on her leg causing some burning. No fevers, chills, nausea, vomiting, or diarrhea. 12/17/2018 on evaluation today patient appears to be doing well with regard to her wound. This does not appear to be any more deep than it was after the last evaluation and debridement last week. With that being said there are some necrotic tissue on the surface of the wound which is can require some sharp debridement at this point. Fortunately there is no evidence of active infection at this time which is good news. No fevers, chills, nausea, vomiting, or diarrhea. 12/31/2018 on evaluation today patient appears to be doing a little better in regard to the overall appearance of her wound currently.  She is tolerating the dressing changes without complication. Fortunately there is no signs of active infection at this time. With that being said she still has some necrotic tissue in the base of the wound that still needs to be removed this is very tough however and I was not able to debrided away last week. She really does not want me to perform any  debridement today either due to how bad it hurt after I did this last time. With that being said I think we may be able to switch the dressing to something else to try to help with clearing this out a little better. 01/14/2019 upon evaluation today patient actually appears to be doing better with regard to her wound. She is showing signs of new granulation tissue and I do feel like that this is starting to improve. That is good news. Fortunately there is no evidence of active infection at this point. No fevers, chills, nausea, vomiting, or diarrhea. 12/30-Patient returns at 2 weeks, she does complain of some discomfort at night in the right gluteal area, she is obviously active and stays on her feet and not in a chair during the day. Denies any fevers chills shakes. Complains that the area on the ulcer feels wet fairly often 02/11/2019 on evaluation today patient appears to be doing okay with regard to her wound. She has been tolerating the dressing changes without complication. Fortunately there is no signs of active infection at this time. No fevers, chills, nausea, vomiting, or diarrhea. She does note however that she is having issues financially with having to come as frequently even as every other week. She states that she is not sure she is going to be able to continue to do so. Fortunately there is no signs of anything doing poorly I think we may be able to spread this out for her in order to help alleviate some of this especially in light of the fact that she does have home health coming out. 03/11/2019 upon evaluation today patient appears to be doing really roughly the same with regard to her wound. This is measuring close to where it was previous although may be slightly smaller. Fortunately there is no evidence of active infection at this time obviously. With that being said this is not healing quite as quickly as I would like to see therefore I may actually perform a repeat culture today to  see if there is anything that shows up at this point. Fortunately there is no sign of active infection systemically for certain. The patient is somewhat frustrated with how long this is taking I completely understand your frustration. Nonetheless I explained that wounds like this often do take a very long time to heal. 04/08/2019 upon evaluation today patient appears to be doing well with regard to her wound. This is not completely healed but does seem to be somewhat better. They have been using silver alginate at this point which is great news and overall the patient does seem to be making some progress. With that being said she did culture positive for MRSA unfortunately she is allergic to pretty much everything. I did want to discuss with her and she did mention today that she be willing to try the linezolid she is never taken this before. Hopefully it will not cause any problems for her. 06/03/2019 upon evaluation today patient appears to be doing well with regard to her wound. She does have a new wound on her left lower extremity that occurred as  a result of swelling of her legs when she was in the hospital he took off of her fluid pills and this made some problem here. With that being said fortunately this seems to be getting better and the patient states she is really not worried about this she has been putting antibiotic ointment on this at home and covering this with a drain seen which seems to be doing fairly well now that she is not draining as much. At one point her daughter was having to change the dressings much more frequently. Fortunately that has improved. Her swelling is better which is also good news. The patient was in the hospital from 05/04/2019 through 05/08/2019 where she had IV antibiotic therapy with vancomycin due to a C. difficile infection. She is better in regard to the infection but still having a lot of weakness and tiredness in general. She did get the gentamicin and they have  been using this at home as far as dressing changes are concerned. She tells me the hospital did not look at her wound at all during the time that she was there. Patient History Information obtained from Patient. Family History Cancer - Father,Mother, Lung Disease - Mother, No family history of Diabetes, Heart Disease, Hereditary Spherocytosis, Hypertension, Kidney Disease, Seizures, Stroke, Thyroid Problems, Tuberculosis. Social History Never smoker, Marital Status - Divorced, Alcohol Use - Never, Drug Use - No History, Caffeine Use - Never. Medical History Eyes Patient has history of Cataracts Respiratory Patient has history of Asthma Cardiovascular Patient has history of Hypertension Endocrine Denies history of Type I Diabetes, Type II Diabetes Integumentary (Skin) Denies history of History of Burn Musculoskeletal Patient has history of Osteoarthritis Hospitalization/Surgery History - [place] - C-diff. Medical A Surgical History Notes nd Respiratory Allergic Rhinitis Cardiovascular Carotid Bruit Gastrointestinal GERD Endocrine Hypothyroidism Musculoskeletal Fibromyalgia, Degenerative Disc Disease, bilateral hip replacements Objective Constitutional Well-nourished and well-hydrated in no acute distress. Vitals Time Taken: 1:13 PM, Height: 60 in, Weight: 115 lbs, BMI: 22.5, Temperature: 98.4 F, Pulse: 67 bpm, Respiratory Rate: 18 breaths/min, Blood Pressure: 153/63 mmHg. Respiratory normal breathing without difficulty. Psychiatric this patient is able to make decisions and demonstrates good insight into disease process. Alert and Oriented x 3. pleasant and cooperative. General Notes: Upon inspection patient's wound bed actually showed signs of good granulation at this time. Fortunately there is no signs of active infection which is great news. This is measuring a lot smaller as compared to last visit. Integumentary (Hair, Skin) Wound #1 status is Open. Original  cause of wound was Gradually Appeared. The wound is located on the Right Gluteus. The wound measures 0.3cm length x 0.5cm width x 1.5cm depth; 0.118cm^2 area and 0.177cm^3 volume. There is Fat Layer (Subcutaneous Tissue) Exposed exposed. There is no tunneling noted, however, there is undermining starting at 9:00 and ending at 1:00 with a maximum distance of 1.3cm. There is a medium amount of serosanguineous drainage noted. The wound margin is flat and intact. There is large (67-100%) pink granulation within the wound bed. There is no necrotic tissue within the wound bed. Wound #2 status is Open. Original cause of wound was Trauma. The wound is located on the Left,Lateral Lower Leg. The wound measures 3.5cm length x 1.7cm width x 0.1cm depth; 4.673cm^2 area and 0.467cm^3 volume. There is Fat Layer (Subcutaneous Tissue) Exposed exposed. There is no tunneling or undermining noted. There is a medium amount of serosanguineous drainage noted. The wound margin is flat and intact. There is small (1-33%) pink granulation  within the wound bed. There is a large (67-100%) amount of necrotic tissue within the wound bed including Adherent Slough. 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 Dressing Change Frequency: Wound #1 Right Gluteus: Change dressing three times week. Wound Cleansing: Wound #1 Right Gluteus: Clean wound with Normal Saline. Wound #2 Left,Lateral Lower Leg: May shower and wash wound with soap and water. Primary Wound Dressing: Wound #1 Right Gluteus: Calcium Alginate with Silver Other: - gentamicin ointment to base of wound then pack lightly with  silver alginate Wound #2 Left,Lateral Lower Leg: Other: - triple antibiotic ointment Secondary Dressing: Wound #1 Right Gluteus: Foam Border Wound #2 Left,Lateral Lower Leg: Foam Border - or large bandaid Edema Control: Elevate legs to the level of the heart or above for 30 minutes daily and/or when sitting, a frequency of: - throughout the day Exercise regularly Home Health: [place] -discontinue wound care, pt's daughter to do dressing changes 1. I would recommend currently that we go ahead and initiate treatment with continuation of gentamicin cream followed by silver alginate packing into her wound in the right gluteal region. 2. With regard to the left lower extremity she will continue use the triple antibiotic ointment at this time and then subsequently cover this with a border foam dressing or large Band-Aid. 3. I do recommend she elevate her legs much as possible. 4. She also needs to continue to take her fluid pill she is now back on those as normal. We will see patient back for reevaluation in 1 week here in the clinic. If anything worsens or changes patient will contact our office for additional recommendations. Electronic Signature(s) Signed: 06/03/2019 2:09:28 PM By: Worthy Keeler PA-C Entered By: Worthy Keeler on 06/03/2019 14:09:27 -------------------------------------------------------------------------------- HxROS Details Patient Name: Date of Service: Penny Perez, Penny Perez 06/03/2019 12:30 PM Medical Record Number: [id number] Patient Account Number: [id number] Date of Birth/Sex: Treating RN: 1932/11/07 (86 y.o. [name] Primary Care Provider: [name] Other Clinician: Referring Provider: Treating Provider/Extender: [name] in Treatment: 18 Information Obtained From Patient Eyes Medical History: Positive for: Cataracts Respiratory Medical History: Positive for: Asthma Past Medical  History Notes: Allergic Rhinitis Cardiovascular Medical History: Positive for: Hypertension Past Medical History Notes: Carotid Bruit Gastrointestinal Medical History: Past Medical History Notes: GERD Endocrine Medical History: Negative for: Type I Diabetes; Type II Diabetes Past Medical History Notes: Hypothyroidism Integumentary (Skin) Medical History: Negative for: History of Burn Musculoskeletal Medical History: Positive for: Osteoarthritis Past Medical History Notes: Fibromyalgia, Degenerative Disc Disease, bilateral hip replacements HBO Extended History Items Eyes: Cataracts Immunizations Pneumococcal Vaccine: Received Pneumococcal Vaccination: Yes Implantable Devices None Hospitalization / Surgery History Type of Hospitalization/Surgery [place] - C-diff Family and Social History Cancer: Yes - Father,Mother; Diabetes: No; Heart Disease: No; Hereditary Spherocytosis: No; Hypertension: No; Kidney Disease: No; Lung Disease: Yes - Mother; Seizures: No; Stroke: No; Thyroid Problems: No; Tuberculosis: No; Never smoker; Marital Status - Divorced; Alcohol Use: Never; Drug Use: No History; Caffeine Use: Never; Financial Concerns: No; Food, Clothing or Shelter Needs: No; Support System Lacking: No; Transportation Concerns: No Electronic Signature(s) Signed: 06/03/2019 6:28:26 PM By: Worthy Keeler PA-C Signed: 06/04/2019 5:16:23 PM By: Levan Hurst RN, BSN Entered By: Levan Hurst  on 06/03/2019 13:13:29 -------------------------------------------------------------------------------- SuperBill Details Patient Name: Date of Service: LEONILDA, COZBY [facility] 06/03/2019 Medical Record Number: [id number] Patient Account Number: [id number] Date of Birth/Sex: Treating RN: 05-16-1932 (86 y.o. 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

## 2019-06-04 NOTE — Progress Notes (Signed)
BRITTINIE, WHERLEY ([id number]) Visit Report for 06/03/2019 Fall Risk Assessment Details Patient Name: Date of Service: Penny Perez, Penny Perez [facility] 06/03/2019 12:30 PM Medical Record Number: [id number] Patient Account Number: [id number] Date of Birth/Sex: Treating RN: 10/25/1932 (86 y.o. [name] Primary Care Saydi Kobel: [name] Other Clinician: Referring Tanish Sinkler: Treating Libi Corso/Extender: [name] in Treatment: 26 Fall Risk Assessment Items Have you had 2 or more falls in the last 12 monthso 0 Yes Have you had any fall that resulted in injury in the last 12 monthso 0 No FALLS RISK SCREEN History of falling - immediate or within 3 months 25 Yes Secondary diagnosis (Do you have 2 or more medical diagnoseso) 15 Yes Ambulatory aid None/bed rest/wheelchair/nurse 0 No Crutches/cane/walker 0 No Furniture 0 No Intravenous therapy Access/Saline/Heparin Lock 0 No Gait/Transferring Normal/ bed rest/ wheelchair 0 No Weak (short steps with or without shuffle, stooped but able to lift head while walking, may seek 10 Yes support from furniture) Impaired (short steps with shuffle, may have difficulty arising from chair, head down, impaired 0 No balance) Mental Status Oriented to own ability 0 Yes Electronic Signature(s) Signed: 06/04/2019 5:16:23 PM By: Levan Hurst RN, BSN Entered By: Levan Hurst on 06/03/2019 13:13:46

## 2019-06-04 NOTE — Progress Notes (Signed)
Penny Perez, Penny Perez ([id number]) Visit Report for 06/03/2019 Arrival Information Details Patient Name: Date of Service: Penny Perez, Penny Perez [facility] 06/03/2019 12:30 PM Medical Record Number: [id number] Patient Account Number: [id number] Date of Birth/Sex: Treating RN: 1932-03-22 (86 y.o. [name] Primary Care Jaque Dacy: [name] Other Clinician: Referring Edynn Gillock: Treating Aviance [place]/Extender: [name] in Treatment: 57 Visit Information History Since Last Visit Added or deleted any medications: No Patient Arrived: Ambulatory Any new allergies or adverse reactions: No Arrival Time: 13:12 Had a fall or experienced change in Yes Accompanied By: daughter activities of daily living that may affect Transfer Assistance: None risk of falls: Patient Identification Verified: Yes Signs or symptoms of abuse/neglect since last visito No Secondary Verification Process Completed: Yes Hospitalized since last visit: Yes Patient Requires Transmission-Based Precautions: No Implantable device outside of the clinic excluding No Patient Has Alerts: No cellular tissue based products placed in the center since last visit: Has Dressing in Place as Prescribed: Yes Pain Present Now: No Electronic Signature(s) Signed: 06/04/2019 5:16:23 PM By: Levan Hurst RN, BSN Entered By: Levan Hurst on 06/03/2019 13:13:10 -------------------------------------------------------------------------------- Clinic Level of Care Assessment Details Patient Name: Date of Service: Penny Perez, Penny Perez [facility] 06/03/2019 12:30 PM Medical Record Number: [id number] Patient Account Number: [id number] Date of Birth/Sex: Treating RN: October 07, 1932 (86 y.o. [name] Primary Care Glynna Failla: [name] Other Clinician: Referring Declan Mier: Treating Aum Caggiano/Extender: [name] in Treatment: 26 Clinic Level of Care Assessment Items TOOL 4 Quantity Score []  - 0 Use  when only an EandM is performed on FOLLOW-UP visit ASSESSMENTS - Nursing Assessment / Reassessment X- 1 10 Reassessment of Co-morbidities (includes updates in patient status) X- 1 5 Reassessment of Adherence to Treatment Plan ASSESSMENTS - Wound and Skin A ssessment / Reassessment []  - 0 Simple Wound Assessment / Reassessment - one wound X- 2 5 Complex Wound Assessment / Reassessment - multiple wounds []  - 0 Dermatologic / Skin Assessment (not related to wound area) ASSESSMENTS - Focused Assessment X- 1 5 Circumferential Edema Measurements - multi extremities []  - 0 Nutritional Assessment / Counseling / Intervention X- 1 5 Lower Extremity Assessment (monofilament, tuning fork, pulses) []  - 0 Peripheral Arterial Disease Assessment (using hand held doppler) ASSESSMENTS - Ostomy and/or Continence Assessment and Care []  - 0 Incontinence Assessment and Management []  - 0 Ostomy Care Assessment and Management (repouching, etc.) PROCESS - Coordination of Care X - Simple Patient / Family Education for ongoing care 1 15 []  - 0 Complex (extensive) Patient / Family Education for ongoing care X- 1 10 Staff obtains Programmer, systems, Records, T Results / Process Orders est X- 1 10 Staff telephones HHA, Nursing Homes / Clarify orders / etc []  - 0 Routine Transfer to another Facility (non-emergent condition) []  - 0 Routine Hospital Admission (non-emergent condition) []  - 0 New Admissions / Biomedical engineer / Ordering NPWT Apligraf, etc. , []  - 0 Emergency Hospital Admission (emergent condition) X- 1 10 Simple Discharge Coordination []  - 0 Complex (extensive) Discharge Coordination PROCESS - Special Needs []  - 0 Pediatric / Minor Patient Management []  - 0 Isolation Patient Management []  - 0 Hearing / Language / Visual special needs []  - 0 Assessment of Community assistance (transportation, D/C planning, etc.) []  - 0 Additional assistance / Altered mentation []  - 0 Support  Surface(s) Assessment (bed, cushion, seat, etc.) INTERVENTIONS - Wound Cleansing / Measurement []  - 0 Simple Wound Cleansing - one wound X- 2 5 Complex Wound Cleansing - multiple wounds X- 1 5  Wound Imaging (photographs - any number of wounds) []  - 0 Wound Tracing (instead of photographs) []  - 0 Simple Wound Measurement - one wound X- 2 5 Complex Wound Measurement - multiple wounds INTERVENTIONS - Wound Dressings X - Small Wound Dressing one or multiple wounds 2 10 []  - 0 Medium Wound Dressing one or multiple wounds []  - 0 Large Wound Dressing one or multiple wounds X- 1 5 Application of Medications - topical []  - 0 Application of Medications - injection INTERVENTIONS - Miscellaneous []  - 0 External ear exam []  - 0 Specimen Collection (cultures, biopsies, blood, body fluids, etc.) []  - 0 Specimen(s) / Culture(s) sent or taken to Lab for analysis []  - 0 Patient Transfer (multiple staff / Civil Service fast streamer / Similar devices) []  - 0 Simple Staple / Suture removal (25 or less) []  - 0 Complex Staple / Suture removal (26 or more) []  - 0 Hypo / Hyperglycemic Management (close monitor of Blood Glucose) []  - 0 Ankle / Brachial Index (ABI) - do not check if billed separately X- 1 5 Vital Signs Has the patient been seen at the hospital within the last three years: Yes Total Score: 135 Level Of Care: New/Established - Level 4 Electronic Signature(s) Signed: 06/03/2019 5:03:53 PM By: Baruch Gouty RN, BSN Entered By: Baruch Gouty on 06/03/2019 13:58:48 -------------------------------------------------------------------------------- Encounter Discharge Information Details Patient Name: Date of Service: Penny Perez, Penny Perez 06/03/2019 12:30 PM Medical Record Number: [id number] Patient Account Number: [id number] Date of Birth/Sex: Treating RN: 1932/05/26 (86 y.o. [name] Primary Care Curt Oatis: [name] Other Clinician: Referring Atley Scarboro: Treating Rhaelyn Giron/Extender:  [name] in Treatment: 26 Encounter Discharge Information Items Discharge Condition: Stable Ambulatory Status: Ambulatory Discharge Destination: Home Transportation: Private Auto Accompanied By: self Schedule Follow-up Appointment: Yes Clinical Summary of Care: Patient Declined Electronic Signature(s) Signed: 06/03/2019 4:37:47 PM By: Carlene Coria RN Entered By: Carlene Coria on 06/03/2019 14:19:01 -------------------------------------------------------------------------------- Lower Extremity Assessment Details Patient Name: Date of Service: Penny Perez, Penny Perez [facility] 06/03/2019 12:30 PM Medical Record Number: [id number] Patient Account Number: [id number] Date of Birth/Sex: Treating RN: 10-Mar-1932 (86 y.o. [name] Primary Care Tanga Gloor: [name] Other Clinician: Referring Dwana Garin: Treating Marnesha Gagen/Extender: [name] in Treatment: 26 Edema Assessment Assessed: [name]: No] [name]: No] Edema: [Left: Ye] [Right: s] Calf Left: Right: Point of Measurement: cm From Medial Instep 34 cm cm Ankle Left: Right: Point of Measurement: cm From Medial Instep 23.4 cm cm Vascular Assessment Pulses: Dorsalis Pedis Palpable: [Left:Yes] Electronic Signature(s) Signed: 06/04/2019 5:16:23 PM By: Levan Hurst RN, BSN Entered By: Levan Hurst on 06/03/2019 13:24:00 -------------------------------------------------------------------------------- Multi-Disciplinary Care Plan Details Patient Name: Date of Service: Penny Perez, Penny Perez 06/03/2019 12:30 PM Medical Record Number: [id number] Patient Account Number: [id number] Date of Birth/Sex: Treating RN: [birth year] (86 y.o. [name] Primary Care Talaysia Pinheiro: [name] Other Clinician: Referring Syler Norcia: Treating Keyvon Herter/Extender: [name] in Treatment: 26 Active Inactive Abuse / Safety / Falls / Self Care Management Nursing  Diagnoses: History of Falls Goals: Patient/caregiver will verbalize/demonstrate measures taken to prevent injury and/or falls Date Initiated: 06/03/2019 Target Resolution Date: 07/01/2019 Goal Status: Active Interventions: Assess fall risk on admission and as needed Assess impairment of mobility on admission and as needed per policy Notes: Wound/Skin Impairment Nursing Diagnoses: Impaired tissue integrity Knowledge deficit related to ulceration/compromised skin integrity Goals: Patient/caregiver will verbalize understanding of skin care regimen Date Initiated: 12/03/2018 Target Resolution Date: 07/01/2019 Goal Status: Active Ulcer/skin breakdown will have a volume  reduction of 30% by week 4 Date Initiated: 12/03/2018 Date Inactivated: 12/31/2018 Target Resolution Date: 12/31/2018 Goal Status: Unmet Unmet Reason: tunnel unroofed Ulcer/skin breakdown will have a volume reduction of 50% by week 8 Date Initiated: 12/31/2018 Date Inactivated: 01/28/2019 Target Resolution Date: 01/28/2019 Goal Status: Unmet Unmet Reason: multiple comorbidities Interventions: Assess patient/caregiver ability to obtain necessary supplies Assess patient/caregiver ability to perform ulcer/skin care regimen upon admission and as needed Assess ulceration(s) every visit Treatment Activities: Skin care regimen initiated : 12/03/2018 Topical wound management initiated : 12/03/2018 Notes: Electronic Signature(s) Signed: 06/03/2019 5:03:53 PM By: Baruch Gouty RN, BSN Entered By: Baruch Gouty on 06/03/2019 13:49:47 -------------------------------------------------------------------------------- Pain Assessment Details Patient Name: Date of Service: Penny Perez, Penny Perez 06/03/2019 12:30 PM Medical Record Number: [id number] Patient Account Number: [id number] Date of Birth/Sex: Treating RN: [birth year] (86 y.o. [name] Primary Care Ell Tiso: [name] Other Clinician: Referring Natalio Salois: Treating  Demario Faniel/Extender: [name] in Treatment: 26 Active Problems Location of Pain Severity and Description of Pain Patient Has Paino No Site Locations Pain Management and Medication Current Pain Management: Electronic Signature(s) Signed: 06/04/2019 5:16:23 PM By: Levan Hurst RN, BSN Entered By: Levan Hurst on 06/03/2019 13:14:15 -------------------------------------------------------------------------------- Patient/Caregiver Education Details Patient Name: Date of Service: Penny Perez, Penny Perez 5/5/2021andnbsp12:30 PM Medical Record Number: [id number] Patient Account Number: [id number] Date of Birth/Gender: Treating RN: [birth year] (86 y.o. [name] Primary Care Physician: [name] Other Clinician: Referring Physician: Treating Physician/Extender: [name] in Treatment: 26 Education Assessment Education Provided To: Patient Education Topics Provided Wound/Skin Impairment: Methods: Explain/Verbal Responses: Reinforcements needed, State content correctly [employer]) Signed: 06/03/2019 5:03:53 PM By: Baruch Gouty RN, BSN Entered By: Baruch Gouty on 06/03/2019 13:42:03 -------------------------------------------------------------------------------- Wound Assessment Details Patient Name: Date of Service: Penny Perez, Penny Perez [facility] 06/03/2019 12:30 PM Medical Record Number: [id number] Patient Account Number: [id number] Date of Birth/Sex: Treating RN: 10/20/1932 (86 y.o. [name] Primary Care Bernice Mullin: [name] Other Clinician: Referring Thessaly Mccullers: Treating Marielle Mantione/Extender: [name] in Treatment: 26 Wound Status Wound Number: 1 Primary Etiology: Atypical Wound Location: Right Gluteus Wound Status: Open Wounding Event: Gradually Appeared Comorbid History: Cataracts, Asthma, Hypertension, Osteoarthritis Date Acquired: 07/30/2018 Weeks Of  Treatment: 26 Clustered Wound: No Photos Photo Uploaded By: Mikeal Hawthorne on 06/04/2019 12:02:49 Wound Measurements Length: (cm) 0.3 Width: (cm) 0.5 Depth: (cm) 1.5 Area: (cm) 0.118 Volume: (cm) 0.177 % Reduction in Area: 89% % Reduction in Volume: 79.3% Epithelialization: Small (1-33%) Tunneling: No Undermining: Yes Starting Position (o'clock): 9 Ending Position (o'clock): 1 Maximum Distance: (cm) 1.3 Wound Description Classification: Full Thickness Without Exposed Support Structures Wound Margin: Flat and Intact Exudate Amount: Medium Exudate Type: Serosanguineous Exudate Color: red, brown Foul Odor After Cleansing: No Slough/Fibrino Yes Wound Bed Granulation Amount: Large (67-100%) Exposed Structure Granulation Quality: Pink Fascia Exposed: No Necrotic Amount: None Present (0%) Fat Layer (Subcutaneous Tissue) Exposed: Yes Tendon Exposed: No Muscle Exposed: No Joint Exposed: No Bone Exposed: No Treatment Notes Wound #1 (Right Gluteus) 1. Cleanse With Wound Cleanser 3. Primary Dressing Applied Calcium Alginate Ag 4. Secondary Dressing Foam Border Dressing Electronic Signature(s) Signed: 06/04/2019 5:16:23 PM By: Levan Hurst RN, BSN Entered By: Levan Hurst on 06/03/2019 13:23:31 -------------------------------------------------------------------------------- Wound Assessment Details Patient Name: Date of Service: Penny Perez, Penny Perez [facility] 06/03/2019 12:30 PM Medical Record Number: [id number] Patient Account Number: [id number] Date of Birth/Sex: Treating RN: 1932-07-08 (86 y.o. [name] Primary Care Gerardo Caiazzo: [name] Other Clinician: Referring Emyah Roznowski: Treating Oluwatimileyin Vivier/Extender: Erling Conte,  Esperanza Richters in Treatment: 26 Wound Status Wound Number: 2 Primary Etiology: Venous Leg Ulcer Wound Location: Left, Lateral Lower Leg Wound Status: Open Wounding Event: Trauma Comorbid History: Cataracts, Asthma, Hypertension,  Osteoarthritis Date Acquired: 05/19/2019 Weeks Of Treatment: 0 Clustered Wound: No Photos Photo Uploaded By: Mikeal Hawthorne on 06/04/2019 12:02:50 Wound Measurements Length: (cm) 3.5 Width: (cm) 1.7 Depth: (cm) 0.1 Area: (cm) 4.673 Volume: (cm) 0.467 % Reduction in Area: % Reduction in Volume: Epithelialization: None Tunneling: No Undermining: No Wound Description Classification: Full Thickness Without Exposed Support Structures Wound Margin: Flat and Intact Exudate Amount: Medium Exudate Type: Serosanguineous Exudate Color: red, brown Wound Bed Granulation Amount: Small (1-33%) Granulation Quality: Pink Necrotic Amount: Large (67-100%) Necrotic Quality: Adherent Slough Foul Odor After Cleansing: No Slough/Fibrino Yes Exposed Structure Fascia Exposed: No Fat Layer (Subcutaneous Tissue) Exposed: Yes Tendon Exposed: No Muscle Exposed: No Joint Exposed: No Bone Exposed: No Treatment Notes Wound #2 (Left, Lateral Lower Leg) 1. Cleanse With Wound Cleanser 3. Primary Dressing Applied Calcium Alginate Ag 4. Secondary Dressing Foam Border Dressing Electronic Signature(s) Signed: 06/04/2019 5:16:23 PM By: Levan Hurst RN, BSN Entered By: Levan Hurst on 06/03/2019 13:22:54 -------------------------------------------------------------------------------- Vitals Details Patient Name: Date of Service: Penny Perez, Penny Perez 06/03/2019 12:30 PM Medical Record Number: [id number] Patient Account Number: [id number] Date of Birth/Sex: Treating RN: [birth year] (86 y.o. [name] Primary Care Colie Fugitt: [name] Other Clinician: Referring Liona Wengert: Treating Afsa Meany/Extender: [name] in Treatment: 26 Vital Signs Time Taken: 13:13 Temperature (F): 98.4 Height (in): 60 Pulse (bpm): 67 Weight (lbs): 115 Respiratory Rate (breaths/min): 18 Body Mass Index (BMI): 22.5 Blood Pressure (mmHg): 153/63 Reference Range: 80 - 120 mg /  dl Electronic Signature(s) Signed: 06/04/2019 5:16:23 PM By: Levan Hurst RN, BSN Entered By: Levan Hurst on 06/03/2019 13:14:10

## 2019-07-01 ENCOUNTER — Encounter (HOSPITAL_BASED_OUTPATIENT_CLINIC_OR_DEPARTMENT_OTHER): Payer: Medicare Other | Admitting: Physician Assistant

## 2019-07-08 ENCOUNTER — Encounter (HOSPITAL_BASED_OUTPATIENT_CLINIC_OR_DEPARTMENT_OTHER): Payer: Medicare Other | Attending: Physician Assistant | Admitting: Physician Assistant

## 2019-07-08 DIAGNOSIS — S31819A Unspecified open wound of right buttock, initial encounter: Secondary | ICD-10-CM | POA: Diagnosis not present

## 2019-07-08 DIAGNOSIS — L97822 Non-pressure chronic ulcer of other part of left lower leg with fat layer exposed: Secondary | ICD-10-CM | POA: Diagnosis not present

## 2019-07-08 DIAGNOSIS — J45909 Unspecified asthma, uncomplicated: Secondary | ICD-10-CM | POA: Diagnosis not present

## 2019-07-08 DIAGNOSIS — I872 Venous insufficiency (chronic) (peripheral): Secondary | ICD-10-CM | POA: Insufficient documentation

## 2019-07-08 DIAGNOSIS — M1990 Unspecified osteoarthritis, unspecified site: Secondary | ICD-10-CM | POA: Diagnosis not present

## 2019-07-08 DIAGNOSIS — X58XXXA Exposure to other specified factors, initial encounter: Secondary | ICD-10-CM | POA: Diagnosis not present

## 2019-07-08 DIAGNOSIS — I10 Essential (primary) hypertension: Secondary | ICD-10-CM | POA: Insufficient documentation

## 2019-07-08 NOTE — Progress Notes (Addendum)
Penny Perez, Penny Perez ([id number]) Visit Report for 07/08/2019 Chief Complaint Document Details Patient Name: Date of Service: Penny Perez, Penny Perez [facility] 07/08/2019 3:45 PM Medical Record Number: [id number] Patient Account Number: [id number] Date of Birth/Sex: Treating RN: 15-Nov-1932 (86 y.o. [name] Primary Care Provider: [name] Other Clinician: Referring Provider: Treating Provider/Extender: [name] in Treatment: 31 Information Obtained from: Patient Chief Complaint Right buttock ulcer and left leg ulcer Electronic Signature(s) Signed: 07/08/2019 3:54:16 PM By: Worthy Keeler PA-C Entered By: Worthy Keeler on 07/08/2019 15:54:16 -------------------------------------------------------------------------------- HPI Details Patient Name: Date of Service: Penny Perez 07/08/2019 3:45 PM Medical Record Number: [id number] Patient Account Number: [id number] Date of Birth/Sex: Treating RN: [birth year] (86 y.o. [name] Primary Care Provider: [name] Other Clinician: Referring Provider: Treating Provider/Extender: [name] in Treatment: 31 History of Present Illness HPI Description: 12/03/2018 on evaluation today patient appears for initial inspection here in our clinic concerning an issue that she has been having a light having in the right gluteal region with an ulcer which initially she describes as having been a knot which when she went to see the dermatologist was biopsied and this was on 08/26/2018. Subsequently that revealed what appeared to be an inflamed ulcer with granulation tissue. Fortunately there was no signs of active infection based on what they saw on pathology. The patient states that since the biopsy she has been having a lot of pain. Fortunately there is no signs of active infection at this time no fever chills noted. She does have a history of hypertension but really no major medical  problems significant otherwise although she does have a lot of allergies including allergies to [facility] as well as antibiotics. In fact she has some ulcers in her mouth secondary to having been prescribed an antibiotic for the current ulcer. She states she has been having trouble sleeping due to the pain in the ulcer area. 12/10/2018 on evaluation today patient's wound actually showed signs of improvement as far as the loosening up of the necrotic tissue is concerned at this point. I do believe that that has done excellent at this point I think that we need to try and clear away some of the necrotic tissue in the base of the wound I discussed that with the patient as well. Fortunately she is not having any significant pain and states the Santyl did not burn although in the past she notes that it did cause her some trouble with the ulcer on her leg causing some burning. No fevers, chills, nausea, vomiting, or diarrhea. 12/17/2018 on evaluation today patient appears to be doing well with regard to her wound. This does not appear to be any more deep than it was after the last evaluation and debridement last week. With that being said there are some necrotic tissue on the surface of the wound which is can require some sharp debridement at this point. Fortunately there is no evidence of active infection at this time which is good news. No fevers, chills, nausea, vomiting, or diarrhea. 12/31/2018 on evaluation today patient appears to be doing a little better in regard to the overall appearance of her wound currently. She is tolerating the dressing changes without complication. Fortunately there is no signs of active infection at this time. With that being said she still has some necrotic tissue in the base of the wound that still needs to be removed this is very tough however and I was not able to  debrided away last week. She really does not want me to perform any debridement today either due to how bad it  hurt after I did this last time. With that being said I think we may be able to switch the dressing to something else to try to help with clearing this out a little better. 01/14/2019 upon evaluation today patient actually appears to be doing better with regard to her wound. She is showing signs of new granulation tissue and I do feel like that this is starting to improve. That is good news. Fortunately there is no evidence of active infection at this point. No fevers, chills, nausea, vomiting, or diarrhea. 12/30-Patient returns at 2 weeks, she does complain of some discomfort at night in the right gluteal area, she is obviously active and stays on her feet and not in a chair during the day. Denies any fevers chills shakes. Complains that the area on the ulcer feels wet fairly often 02/11/2019 on evaluation today patient appears to be doing okay with regard to her wound. She has been tolerating the dressing changes without complication. Fortunately there is no signs of active infection at this time. No fevers, chills, nausea, vomiting, or diarrhea. She does note however that she is having issues financially with having to come as frequently even as every other week. She states that she is not sure she is going to be able to continue to do so. Fortunately there is no signs of anything doing poorly I think we may be able to spread this out for her in order to help alleviate some of this especially in light of the fact that she does have home health coming out. 03/11/2019 upon evaluation today patient appears to be doing really roughly the same with regard to her wound. This is measuring close to where it was previous although may be slightly smaller. Fortunately there is no evidence of active infection at this time obviously. With that being said this is not healing quite as quickly as I would like to see therefore I may actually perform a repeat culture today to see if there is anything that shows up at  this point. Fortunately there is no sign of active infection systemically for certain. The patient is somewhat frustrated with how long this is taking I completely understand your frustration. Nonetheless I explained that wounds like this often do take a very long time to heal. 04/08/2019 upon evaluation today patient appears to be doing well with regard to her wound. This is not completely healed but does seem to be somewhat better. They have been using silver alginate at this point which is great news and overall the patient does seem to be making some progress. With that being said she did culture positive for MRSA unfortunately she is allergic to pretty much everything. I did want to discuss with her and she did mention today that she be willing to try the linezolid she is never taken this before. Hopefully it will not cause any problems for her. 06/03/2019 upon evaluation today patient appears to be doing well with regard to her wound. She does have a new wound on her left lower extremity that occurred as a result of swelling of her legs when she was in the hospital he took off of her fluid pills and this made some problem here. With that being said fortunately this seems to be getting better and the patient states she is really not worried about this she has been putting  antibiotic ointment on this at home and covering this with a drain seen which seems to be doing fairly well now that she is not draining as much. At one point her daughter was having to change the dressings much more frequently. Fortunately that has improved. Her swelling is better which is also good news. The patient was in the hospital from 05/04/2019 through 05/08/2019 where she had IV antibiotic therapy with vancomycin due to a C. difficile infection. She is better in regard to the infection but still having a lot of weakness and tiredness in general. She did get the gentamicin and they have been using this at home as far as dressing  changes are concerned. She tells me the hospital did not look at her wound at all during the time that she was there. 07/08/2019 upon evaluation today patient appears to be doing better in regard to her leg which is healed and that is excellent news. With regard to her wound in the right gluteal region this is still open and though it does not appear to be showing any signs of infection there is some dried gentamicin cream in the base of the wound. I think we probably do not need the gentamicin any longer based on what I am seeing she has a lot of new skin and epithelization on the sidewalls of the wound and I think this is good to heal with somewhat of a tunnel which is okay as long as it heals. Again this is something we discussed in the past. Electronic Signature(s) Signed: 07/08/2019 6:27:12 PM By: Worthy Keeler PA-C Entered By: Worthy Keeler on 07/08/2019 18:27:12 -------------------------------------------------------------------------------- Physical Exam Details Patient Name: Date of Service: Penny Perez, Penny Perez 07/08/2019 3:45 PM Medical Record Number: [id number] Patient Account Number: [id number] Date of Birth/Sex: Treating RN: Apr 12, 1932 (86 y.o. [name] Primary Care Provider: [name] Other Clinician: Referring Provider: Treating Provider/Extender: [name] in Treatment: 66 Constitutional Well-nourished and well-hydrated in no acute distress. Respiratory normal breathing without difficulty. Psychiatric this patient is able to make decisions and demonstrates good insight into disease process. Alert and Oriented x 3. pleasant and cooperative. Notes Upon inspection patient's wound bed actually showed signs of good granulation at this time and there does not appear to be any evidence of active infection which is great news. Overall I am extremely pleased with the progress that is been made up to this point. Electronic  Signature(s) Signed: 07/08/2019 6:27:32 PM By: Worthy Keeler PA-C Entered By: Worthy Keeler on 07/08/2019 18:27:32 -------------------------------------------------------------------------------- Physician Orders Details Patient Name: Date of Service: Penny Perez, Penny Perez 07/08/2019 3:45 PM Medical Record Number: [id number] Patient Account Number: [id number] Date of Birth/Sex: Treating RN: 1932/09/28 (86 y.o. 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 Dressing Change Frequency Wound #1 Right Gluteus Change dressing three times week. Wound Cleansing Wound #1 Right Gluteus Clean wound with Normal Saline. - may wipe out base with q-tip Primary Wound Dressing Wound #1 Right Gluteus Calcium Alginate with Silver Secondary Dressing Wound #1 Right  Gluteus Foam Border Edema Control Elevate legs to the level of the heart or above for 30 minutes daily and/or when sitting, a frequency of: - throughout the day Exercise regularly Electronic Signature(s) Signed: 07/08/2019 6:08:32 PM By: Baruch Gouty RN, BSN Signed: 07/08/2019 6:29:31 PM By: Worthy Keeler PA-C Entered By: Baruch Gouty on 07/08/2019 17:13:08 -------------------------------------------------------------------------------- Problem List Details Patient Name: Date of Service: Penny Perez, Penny Perez [facility] 07/08/2019 3:45 PM Medical Record Number:  [id number] Patient Account Number: [id number] Date of Birth/Sex: Treating RN: Nov 28, 1932 (86 y.o. 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 12/03/2018 No Yes I10 Essential (primary) hypertension 12/03/2018 No Yes I87.2 Venous insufficiency (chronic) (peripheral) 06/03/2019 No Yes L97.822 Non-pressure chronic ulcer of other part of left lower leg with fat layer exposed5/05/2019 No Yes Inactive Problems Resolved Problems Electronic Signature(s) Signed: 07/08/2019 3:54:11 PM By: Worthy Keeler PA-C Entered By: Worthy Keeler on 07/08/2019 15:54:11 -------------------------------------------------------------------------------- Progress Note Details Patient Name: Date of Service: Penny Perez, Penny Perez [facility] 07/08/2019 3:45 PM Medical Record Number: [id number] Patient Account Number: [id number] Date of Birth/Sex: Treating RN: 1932/06/08 (86 y.o. [name] Primary Care Provider: [name] Other Clinician: Referring Provider: Treating Provider/Extender: [name] in Treatment: 4 Subjective Chief Complaint Information obtained from Patient Right buttock ulcer and left leg ulcer History of Present Illness (HPI) 12/03/2018 on evaluation today patient appears for initial inspection here in our clinic concerning an issue that she has been having a light having in the right gluteal region with an ulcer which initially she describes as having been a knot which when she went to see the dermatologist was biopsied and this was on 08/26/2018. Subsequently that revealed what appeared to be an inflamed  ulcer with granulation tissue. Fortunately there was no signs of active infection based on what they saw on pathology. The patient states that since the biopsy she has been having a lot of pain. Fortunately there is no signs of active infection at this time no fever chills noted. She does have a history of hypertension but really no major medical problems significant otherwise although she does have a lot of allergies including allergies to [facility] as well as antibiotics. In fact she has some ulcers in her mouth secondary to having been prescribed an antibiotic for the current ulcer. She states she has been having trouble sleeping due to the pain in the ulcer area. 12/10/2018 on evaluation today patient's wound actually showed signs of improvement as far as the loosening up of the necrotic tissue is concerned at this point. I do believe that that has done excellent at this point I think that we need to try and clear away some of the necrotic tissue in the base of the wound I discussed that with the patient as well. Fortunately she is not having any significant pain and states the Santyl did not burn although in the past she notes that it did cause her some trouble with the ulcer on her leg causing some burning. No fevers, chills, nausea, vomiting, or diarrhea. 12/17/2018 on evaluation today patient appears to be doing well with regard to her wound. This does not appear to be any more deep than it was after the last evaluation and  debridement last week. With that being said there are some necrotic tissue on the surface of the wound which is can require some sharp debridement at this point. Fortunately there is no evidence of active infection at this time which is good news. No fevers, chills, nausea, vomiting, or diarrhea. 12/31/2018 on evaluation today patient appears to be doing a little better in regard to the overall appearance of her wound currently. She is tolerating the dressing changes without  complication. Fortunately there is no signs of active infection at this time. With that being said she still has some necrotic tissue in the base of the wound that still needs to be removed this is very tough however and I was not able to debrided away last week. She really does not want me to perform any debridement today either due to how bad it hurt after I did this last time. With that being said I think we may be able to switch the dressing to something else to try to help with clearing this out a little better. 01/14/2019 upon evaluation today patient actually appears to be doing better with regard to her wound. She is showing signs of new granulation tissue and I do feel like that this is starting to improve. That is good news. Fortunately there is no evidence of active infection at this point. No fevers, chills, nausea, vomiting, or diarrhea. 12/30-Patient returns at 2 weeks, she does complain of some discomfort at night in the right gluteal area, she is obviously active and stays on her feet and not in a chair during the day. Denies any fevers chills shakes. Complains that the area on the ulcer feels wet fairly often 02/11/2019 on evaluation today patient appears to be doing okay with regard to her wound. She has been tolerating the dressing changes without complication. Fortunately there is no signs of active infection at this time. No fevers, chills, nausea, vomiting, or diarrhea. She does note however that she is having issues financially with having to come as frequently even as every other week. She states that she is not sure she is going to be able to continue to do so. Fortunately there is no signs of anything doing poorly I think we may be able to spread this out for her in order to help alleviate some of this especially in light of the fact that she does have home health coming out. 03/11/2019 upon evaluation today patient appears to be doing really roughly the same with regard to her  wound. This is measuring close to where it was previous although may be slightly smaller. Fortunately there is no evidence of active infection at this time obviously. With that being said this is not healing quite as quickly as I would like to see therefore I may actually perform a repeat culture today to see if there is anything that shows up at this point. Fortunately there is no sign of active infection systemically for certain. The patient is somewhat frustrated with how long this is taking I completely understand your frustration. Nonetheless I explained that wounds like this often do take a very long time to heal. 04/08/2019 upon evaluation today patient appears to be doing well with regard to her wound. This is not completely healed but does seem to be somewhat better. They have been using silver alginate at this point which is great news and overall the patient does seem to be making some progress. With that being said she did culture positive for MRSA  unfortunately she is allergic to pretty much everything. I did want to discuss with her and she did mention today that she be willing to try the linezolid she is never taken this before. Hopefully it will not cause any problems for her. 06/03/2019 upon evaluation today patient appears to be doing well with regard to her wound. She does have a new wound on her left lower extremity that occurred as a result of swelling of her legs when she was in the hospital he took off of her fluid pills and this made some problem here. With that being said fortunately this seems to be getting better and the patient states she is really not worried about this she has been putting antibiotic ointment on this at home and covering this with a drain seen which seems to be doing fairly well now that she is not draining as much. At one point her daughter was having to change the dressings much more frequently. Fortunately that has improved. Her swelling is better which is  also good news. The patient was in the hospital from 05/04/2019 through 05/08/2019 where she had IV antibiotic therapy with vancomycin due to a C. difficile infection. She is better in regard to the infection but still having a lot of weakness and tiredness in general. She did get the gentamicin and they have been using this at home as far as dressing changes are concerned. She tells me the hospital did not look at her wound at all during the time that she was there. 07/08/2019 upon evaluation today patient appears to be doing better in regard to her leg which is healed and that is excellent news. With regard to her wound in the right gluteal region this is still open and though it does not appear to be showing any signs of infection there is some dried gentamicin cream in the base of the wound. I think we probably do not need the gentamicin any longer based on what I am seeing she has a lot of new skin and epithelization on the sidewalls of the wound and I think this is good to heal with somewhat of a tunnel which is okay as long as it heals. Again this is something we discussed in the past. Objective Constitutional Well-nourished and well-hydrated in no acute distress. Vitals Time Taken: 4:23 PM, Height: 60 in, Weight: 115 lbs, BMI: 22.5, Temperature: 98.5 F, Pulse: 84 bpm, Respiratory Rate: 18 breaths/min, Blood Pressure: 156/78 mmHg. Respiratory normal breathing without difficulty. Psychiatric this patient is able to make decisions and demonstrates good insight into disease process. Alert and Oriented x 3. pleasant and cooperative. General Notes: Upon inspection patient's wound bed actually showed signs of good granulation at this time and there does not appear to be any evidence of active infection which is great news. Overall I am extremely pleased with the progress that is been made up to this point. Integumentary (Hair, Skin) Wound #1 status is Open. Original cause of wound was Gradually  Appeared. The wound is located on the Right Gluteus. The wound measures 0.4cm length x 0.4cm width x 1.3cm depth; 0.126cm^2 area and 0.163cm^3 volume. There is Fat Layer (Subcutaneous Tissue) Exposed exposed. There is undermining starting at 9:00 and ending at 1:00 with a maximum distance of 1.6cm. There is a medium amount of serosanguineous drainage noted. The wound margin is flat and intact. There is large (67-100%) pink granulation within the wound bed. There is no necrotic tissue within the wound bed. Wound #2  status is Healed - Epithelialized. Original cause of wound was Trauma. The wound is located on the Left,Lateral Lower Leg. The wound measures 0cm length x 0cm width x 0cm depth; 0cm^2 area and 0cm^3 volume. 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 Dressing Change Frequency: Wound #1 Right Gluteus: Change dressing three times week. Wound Cleansing: Wound #1 Right Gluteus: Clean wound with Normal Saline. - may wipe out base with q-tip Primary Wound Dressing: Wound #1 Right Gluteus: Calcium Alginate with Silver Secondary Dressing: Wound #1 Right Gluteus: Foam Border Edema Control: Elevate legs to the level of the heart or above for 30 minutes daily and/or when sitting, a frequency of: - throughout the day Exercise regularly 1. The patient's daughter is doing a great job of packing this wound and I would recommend that we continue as such. She is in agreement with that plan. We will continue to use a silver alginate dressing. 2. I am also going to suggest that we continue with the appropriate offloading she does a great job of this and  fortunately she is not having a lot of pain. We are using a border foam dressing that some cushioning. We will see patient back for reevaluation in 1 Month here in the clinic. If anything worsens or changes patient will contact our office for additional recommendations. Electronic Signature(s) Signed: 07/08/2019 6:28:23 PM By: Worthy Keeler PA-C Entered By: Worthy Keeler on 07/08/2019 26:94:85 -------------------------------------------------------------------------------- SuperBill Details Patient Name: Date of Service: Penny Perez, Penny Perez 07/08/2019 Medical Record Number: [id number] Patient Account Number: [id number] Date of Birth/Sex: Treating RN: [birth year] (86 y.o. 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

## 2019-07-08 NOTE — Progress Notes (Signed)
Penny, Perez ([id number]) Visit Report for 07/08/2019 Arrival Information Details Patient Name: Date of Service: Penny Perez, Penny Perez [facility] 07/08/2019 3:45 PM Medical Record Number: [id number] Patient Account Number: [id number] Date of Birth/Sex: Treating RN: 03/16/1932 (86 y.o. Penny Perez, [name] Kaelob Persky: [name] Other Clinician: Referring [name]: Treating [name]/Extender: [name] in Treatment: 77 Visit Information History Since Last Visit Added or deleted any medications: No Patient Arrived: Ambulatory Any new allergies or adverse reactions: No Arrival Time: 16:23 Had a fall or experienced change in No Accompanied By: family member activities of daily living that may affect Transfer Assistance: None risk of falls: Patient Identification Verified: Yes Signs or symptoms of abuse/neglect since last visito No Secondary Verification Process Completed: Yes Hospitalized since last visit: No Patient Requires Transmission-Based Precautions: No Implantable device outside of the clinic excluding No Patient Has Alerts: No cellular tissue based products placed in the center since last visit: Has Dressing in Place as Prescribed: Yes Pain Present Now: No Electronic Signature(s) Signed: 07/08/2019 5:57:14 PM By: Deon Pilling Entered By: Deon Pilling on 07/08/2019 16:23:20 -------------------------------------------------------------------------------- Clinic Level of Care Assessment Details Patient Name: Date of Service: Penny, Perez 07/08/2019 3:45 PM Medical Record Number: [id number] Patient Account Number: [id number] Date of Birth/Sex: Treating RN: 1932-11-15 (86 y.o. Penny Perez Primary Care Analilia Geddis: [name] Other Clinician: Referring [name]: Treating [name]/Extender: [name] in Treatment: 31 Clinic Level of Care Assessment Items TOOL 4 Quantity Score []  - 0 Use when only an  EandM is performed on FOLLOW-UP visit ASSESSMENTS - Nursing Assessment / Reassessment X- 1 10 Reassessment of Co-morbidities (includes updates in patient status) X- 1 5 Reassessment of Adherence to Treatment Plan ASSESSMENTS - Wound and Skin A ssessment / Reassessment X - Simple Wound Assessment / Reassessment - one wound 1 5 []  - 0 Complex Wound Assessment / Reassessment - multiple wounds []  - 0 Dermatologic / Skin Assessment (not related to wound area) ASSESSMENTS - Focused Assessment []  - 0 Circumferential Edema Measurements - multi extremities []  - 0 Nutritional Assessment / Counseling / Intervention []  - 0 Lower Extremity Assessment (monofilament, tuning fork, pulses) []  - 0 Peripheral Arterial Disease Assessment (using hand held doppler) ASSESSMENTS - Ostomy and/or Continence Assessment and Care []  - 0 Incontinence Assessment and Management []  - 0 Ostomy Care Assessment and Management (repouching, etc.) PROCESS - Coordination of Care X - Simple Patient / Family Education for ongoing care 1 15 []  - 0 Complex (extensive) Patient / Family Education for ongoing care X- 1 10 Staff obtains Programmer, systems, Records, T Results / Process Orders est []  - 0 Staff telephones HHA, Nursing Homes / Clarify orders / etc []  - 0 Routine Transfer to another Facility (non-emergent condition) []  - 0 Routine Hospital Admission (non-emergent condition) []  - 0 New Admissions / Biomedical engineer / Ordering NPWT Apligraf, etc. , []  - 0 Emergency Hospital Admission (emergent condition) X- 1 10 Simple Discharge Coordination []  - 0 Complex (extensive) Discharge Coordination PROCESS - Special Needs []  - 0 Pediatric / Minor Patient Management []  - 0 Isolation Patient Management []  - 0 Hearing / Language / Visual special needs []  - 0 Assessment of Community assistance (transportation, D/C planning, etc.) []  - 0 Additional assistance / Altered mentation []  - 0 Support Surface(s)  Assessment (bed, cushion, seat, etc.) INTERVENTIONS - Wound Cleansing / Measurement X - Simple Wound Cleansing - one wound 1 5 []  - 0 Complex Wound Cleansing - multiple wounds X- 1  5 Wound Imaging (photographs - any number of wounds) []  - 0 Wound Tracing (instead of photographs) X- 1 5 Simple Wound Measurement - one wound []  - 0 Complex Wound Measurement - multiple wounds INTERVENTIONS - Wound Dressings X - Small Wound Dressing one or multiple wounds 1 10 []  - 0 Medium Wound Dressing one or multiple wounds []  - 0 Large Wound Dressing one or multiple wounds X- 1 5 Application of Medications - topical []  - 0 Application of Medications - injection INTERVENTIONS - Miscellaneous []  - 0 External ear exam []  - 0 Specimen Collection (cultures, biopsies, blood, body fluids, etc.) []  - 0 Specimen(s) / Culture(s) sent or taken to Lab for analysis []  - 0 Patient Transfer (multiple staff / Civil Service fast streamer / Similar devices) []  - 0 Simple Staple / Suture removal (25 or less) []  - 0 Complex Staple / Suture removal (26 or more) []  - 0 Hypo / Hyperglycemic Management (close monitor of Blood Glucose) []  - 0 Ankle / Brachial Index (ABI) - do not check if billed separately X- 1 5 Vital Signs Has the patient been seen at the hospital within the last three years: Yes Total Score: 90 Level Of Care: New/Established - Level 3 Electronic Signature(s) Signed: 07/08/2019 6:08:32 PM By: Baruch Gouty RN, BSN Entered By: Baruch Gouty on 07/08/2019 17:14:11 -------------------------------------------------------------------------------- Lower Extremity Assessment Details Patient Name: Date of Service: SOLACE, MANWARREN [facility] 07/08/2019 3:45 PM Medical Record Number: [id number] Patient Account Number: [id number] Date of Birth/Sex: Treating RN: 1932/10/05 (86 y.o. Debby Bud Primary Care Penny Perez: [name] Other Clinician: Referring [name]: Treating Penny Perez/Extender: [name] in Treatment: 31 Edema Assessment Assessed: Penny Perez: Yes] Penny Perez: No] Edema: [Left: N] [Right: o] Calf Left: Right: Point of Measurement: cm From Medial Instep 32 cm cm Ankle Left: Right: Point of Measurement: cm From Medial Instep 20 cm cm Vascular Assessment Pulses: Dorsalis Pedis Palpable: [Left:Yes] Electronic Signature(s) Signed: 07/08/2019 5:57:14 PM By: Deon Pilling Entered By: Deon Pilling on 07/08/2019 16:24:42 -------------------------------------------------------------------------------- [place] Details Patient Name: Date of Service: YEILY, LINK [facility] 07/08/2019 3:45 PM Medical Record Number: [id number] Patient Account Number: [id number] Date of Birth/Sex: Treating RN: 04/15/1932 (86 y.o. Penny Perez Primary Care Netta Fodge: [name] Other Clinician: Referring Nymir Ringler: Treating Princes Finger/Extender: [name] in Treatment: 44 Active Inactive Abuse / Safety / Falls / Self Care Management Nursing Diagnoses: History of Falls Goals: Patient/caregiver will verbalize/demonstrate measures taken to prevent injury and/or falls Date Initiated: 06/03/2019 Target Resolution Date: 07/29/2019 Goal Status: Active Interventions: Assess fall risk on admission and as needed Assess impairment of mobility on admission and as needed per policy Notes: Wound/Skin Impairment Nursing Diagnoses: Impaired tissue integrity Knowledge deficit related to ulceration/compromised skin integrity Goals: Patient/caregiver will verbalize understanding of skin care regimen Date Initiated: 12/03/2018 Target Resolution Date: 07/29/2019 Goal Status: Active Ulcer/skin breakdown will have a volume reduction of 30% by week 4 Date Initiated: 12/03/2018 Date Inactivated: 12/31/2018 Target Resolution Date: 12/31/2018 Goal Status: Unmet Unmet Reason: tunnel unroofed Ulcer/skin breakdown will have a volume reduction of  50% by week 8 Date Initiated: 12/31/2018 Date Inactivated: 01/28/2019 Target Resolution Date: 01/28/2019 Goal Status: Unmet Unmet Reason: multiple comorbidities Interventions: Assess patient/caregiver ability to obtain necessary supplies Assess patient/caregiver ability to perform ulcer/skin care regimen upon admission and as needed Assess ulceration(s) every visit Treatment Activities: Skin care regimen initiated : 12/03/2018 Topical wound management initiated : 12/03/2018 Notes: Electronic Signature(s) Signed: 07/08/2019 6:08:32 PM By: Baruch Gouty RN,  BSN Entered By: Baruch Gouty on 07/08/2019 17:10:45 -------------------------------------------------------------------------------- Pain Assessment Details Patient Name: Date of Service: GITA, DILGER [facility] 07/08/2019 3:45 PM Medical Record Number: [id number] Patient Account Number: [id number] Date of Birth/Sex: Treating RN: 25-Apr-1932 (86 y.o. Debby Bud Primary Care Inman Fettig: [name] Other Clinician: Referring Tiago Humphrey: Treating Ariany Kesselman/Extender: [name] in Treatment: 31 Active Problems Location of Pain Severity and Description of Pain Patient Has Paino No Site Locations Rate the pain. Rate the pain. Current Pain Level: 0 Pain Management and Medication Current Pain Management: Medication: No Cold Application: No Rest: No Massage: No Activity: No T.E.N.S.: No Heat Application: No Leg drop or elevation: No Is the Current Pain Management Adequate: Adequate How does your wound impact your activities of daily livingo Sleep: No Bathing: No Appetite: No Relationship With Others: No Bladder Continence: No Emotions: No Bowel Continence: No Work: No Toileting: No Drive: No Dressing: No Hobbies: No Electronic Signature(s) Signed: 07/08/2019 5:57:14 PM By: Deon Pilling Entered By: Deon Pilling on 07/08/2019  16:23:48 -------------------------------------------------------------------------------- Patient/Caregiver Education Details Patient Name: Date of Service: NOBIE, ALLEYNE BETH 6/9/2021andnbsp3:45 PM Medical Record Number: [id number] Patient Account Number: [id number] Date of Birth/Gender: Treating RN: July 16, 1932 (86 y.o. Penny Perez Primary Care Physician: [name] Other Clinician: Referring Physician: Treating Physician/Extender: [name] in Treatment: 31 Education Assessment Education Provided To: Patient Education Topics Provided Wound/Skin Impairment: Methods: Explain/Verbal Responses: Reinforcements needed, State content correctly [employer]) Signed: 07/08/2019 6:08:32 PM By: Baruch Gouty RN, BSN Entered By: Baruch Gouty on 07/08/2019 17:11:48 -------------------------------------------------------------------------------- Wound Assessment Details Patient Name: Date of Service: MEIRA, WAHBA 07/08/2019 3:45 PM Medical Record Number: [id number] Patient Account Number: [id number] Date of Birth/Sex: Treating RN: 04/28/1932 (86 y.o. Penny Perez, [name] Quintell Bonnin: [name] Other Clinician: Referring Jonet Mathies: Treating Johnn Krasowski/Extender: [name] in Treatment: 31 Wound Status Wound Number: 1 Primary Etiology: Atypical Wound Location: Right Gluteus Wound Status: Open Wounding Event: Gradually Appeared Comorbid History: Cataracts, Asthma, Hypertension, Osteoarthritis Date Acquired: 07/30/2018 Weeks Of Treatment: 31 Clustered Wound: No Wound Measurements Length: (cm) 0.4 Width: (cm) 0.4 Depth: (cm) 1.3 Area: (cm) 0.126 Volume: (cm) 0.163 % Reduction in Area: 88.2% % Reduction in Volume: 80.9% Epithelialization: Small (1-33%) Undermining: Yes Starting Position (o'clock): 9 Ending Position (o'clock): 1 Maximum Distance: (cm) 1.6 Wound  Description Classification: Full Thickness Without Exposed Support Structures Wound Margin: Flat and Intact Exudate Amount: Medium Exudate Type: Serosanguineous Exudate Color: red, brown Foul Odor After Cleansing: No Slough/Fibrino Yes Wound Bed Granulation Amount: Large (67-100%) Exposed Structure Granulation Quality: Pink Fascia Exposed: No Necrotic Amount: None Present (0%) Fat Layer (Subcutaneous Tissue) Exposed: Yes Tendon Exposed: No Muscle Exposed: No Joint Exposed: No Bone Exposed: No Electronic Signature(s) Signed: 07/08/2019 5:57:14 PM By: Deon Pilling Entered By: Deon Pilling on 07/08/2019 16:28:43 -------------------------------------------------------------------------------- Wound Assessment Details Patient Name: Date of Service: LILYANN, GRAVELLE 07/08/2019 3:45 PM Medical Record Number: [id number] Patient Account Number: [id number] Date of Birth/Sex: Treating RN: 07/29/1932 (86 y.o. Penny Perez, [name] Chantee Cerino: [name] Other Clinician: Referring Jozy Mcphearson: Treating Ansley Stanwood/Extender: [name] in Treatment: 31 Wound Status Wound Number: 2 Primary Etiology: Venous Leg Ulcer Wound Location: Left, Lateral Lower Leg Wound Status: Healed - Epithelialized Wounding Event: Trauma Date Acquired: 05/19/2019 Weeks Of Treatment: 5 Clustered Wound: No Wound Measurements Length: (cm) Width: (cm) Depth: (cm) Area: (cm) Volume: (cm) 0 % Reduction in Area: 100% 0 % Reduction in Volume: 100% 0 0  0 Wound Description Classification: Full Thickness Without Exposed Support Structur es Electronic Signature(s) Signed: 07/08/2019 5:57:14 PM By: Deon Pilling Entered By: Deon Pilling on 07/08/2019 16:26:28 -------------------------------------------------------------------------------- Vitals Details Patient Name: Date of Service: NATASIA, SANKO 07/08/2019 3:45 PM Medical Record Number: [id number] Patient Account  Number: [id number] Date of Birth/Sex: Treating RN: 03/17/1932 (86 y.o. Penny Perez, [name] Neima Lacross: [name] Other Clinician: Referring Terese Heier: Treating Katlynne Mckercher/Extender: [name] in Treatment: 31 Vital Signs Time Taken: 16:23 Temperature (F): 98.5 Height (in): 60 Pulse (bpm): 84 Weight (lbs): 115 Respiratory Rate (breaths/min): 18 Body Mass Index (BMI): 22.5 Blood Pressure (mmHg): 156/78 Reference Range: 80 - 120 mg / dl Electronic Signature(s) Signed: 07/08/2019 5:57:14 PM By: Deon Pilling Entered By: Deon Pilling on 07/08/2019 16:23:39

## 2019-08-05 ENCOUNTER — Encounter (HOSPITAL_BASED_OUTPATIENT_CLINIC_OR_DEPARTMENT_OTHER): Payer: Medicare Other | Admitting: Physician Assistant

## 2019-08-11 ENCOUNTER — Encounter (HOSPITAL_BASED_OUTPATIENT_CLINIC_OR_DEPARTMENT_OTHER): Payer: Medicare Other | Attending: Internal Medicine | Admitting: Internal Medicine

## 2019-08-11 DIAGNOSIS — L97822 Non-pressure chronic ulcer of other part of left lower leg with fat layer exposed: Secondary | ICD-10-CM | POA: Diagnosis present

## 2019-08-11 DIAGNOSIS — I872 Venous insufficiency (chronic) (peripheral): Secondary | ICD-10-CM | POA: Insufficient documentation

## 2019-08-11 DIAGNOSIS — L98418 Non-pressure chronic ulcer of buttock with other specified severity: Secondary | ICD-10-CM | POA: Diagnosis not present

## 2019-08-11 DIAGNOSIS — I10 Essential (primary) hypertension: Secondary | ICD-10-CM | POA: Insufficient documentation

## 2019-08-11 NOTE — Progress Notes (Addendum)
Penny Perez, Penny Perez ([id number]) Visit Report for 08/11/2019 HPI Details Patient Name: Date of Service: Penny Perez, Penny Perez [facility] 08/11/2019 3:00 PM Medical Record Number: [id number] Patient Account Number: [id number] Date of Birth/Sex: Treating RN: Apr 05, 1932 (86 y.o. [name] Primary Care Provider: [name] Other Clinician: Referring Provider: Treating Provider/Extender: Wende Mott in Treatment: 50 History of Present Illness HPI Description: 12/03/2018 on evaluation today patient appears for initial inspection here in our clinic concerning an issue that she has been having a light having in the right gluteal region with an ulcer which initially she describes as having been a knot which when she went to see the dermatologist was biopsied and this was on 08/26/2018. Subsequently that revealed what appeared to be an inflamed ulcer with granulation tissue. Fortunately there was no signs of active infection based on what they saw on pathology. The patient states that since the biopsy she has been having a lot of pain. Fortunately there is no signs of active infection at this time no fever chills noted. She does have a history of hypertension but really no major medical problems significant otherwise although she does have a lot of allergies including allergies to [facility] as well as antibiotics. In fact she has some ulcers in her mouth secondary to having been prescribed an antibiotic for the current ulcer. She states she has been having trouble sleeping due to the pain in the ulcer area. 12/10/2018 on evaluation today patient's wound actually showed signs of improvement as far as the loosening up of the necrotic tissue is concerned at this point. I do believe that that has done excellent at this point I think that we need to try and clear away some of the necrotic tissue in the base of the wound I discussed that with the patient as well. Fortunately she is not  having any significant pain and states the Santyl did not burn although in the past she notes that it did cause her some trouble with the ulcer on her leg causing some burning. No fevers, chills, nausea, vomiting, or diarrhea. 12/17/2018 on evaluation today patient appears to be doing well with regard to her wound. This does not appear to be any more deep than it was after the last evaluation and debridement last week. With that being said there are some necrotic tissue on the surface of the wound which is can require some sharp debridement at this point. Fortunately there is no evidence of active infection at this time which is good news. No fevers, chills, nausea, vomiting, or diarrhea. 12/31/2018 on evaluation today patient appears to be doing a little better in regard to the overall appearance of her wound currently. She is tolerating the dressing changes without complication. Fortunately there is no signs of active infection at this time. With that being said she still has some necrotic tissue in the base of the wound that still needs to be removed this is very tough however and I was not able to debrided away last week. She really does not want me to perform any debridement today either due to how bad it hurt after I did this last time. With that being said I think we may be able to switch the dressing to something else to try to help with clearing this out a little better. 01/14/2019 upon evaluation today patient actually appears to be doing better with regard to her wound. She is showing signs of new granulation tissue and I do feel like that this is  starting to improve. That is good news. Fortunately there is no evidence of active infection at this point. No fevers, chills, nausea, vomiting, or diarrhea. 12/30-Patient returns at 2 weeks, she does complain of some discomfort at night in the right gluteal area, she is obviously active and stays on her feet and not in a chair during the day.  Denies any fevers chills shakes. Complains that the area on the ulcer feels wet fairly often 02/11/2019 on evaluation today patient appears to be doing okay with regard to her wound. She has been tolerating the dressing changes without complication. Fortunately there is no signs of active infection at this time. No fevers, chills, nausea, vomiting, or diarrhea. She does note however that she is having issues financially with having to come as frequently even as every other week. She states that she is not sure she is going to be able to continue to do so. Fortunately there is no signs of anything doing poorly I think we may be able to spread this out for her in order to help alleviate some of this especially in light of the fact that she does have home health coming out. 03/11/2019 upon evaluation today patient appears to be doing really roughly the same with regard to her wound. This is measuring close to where it was previous although may be slightly smaller. Fortunately there is no evidence of active infection at this time obviously. With that being said this is not healing quite as quickly as I would like to see therefore I may actually perform a repeat culture today to see if there is anything that shows up at this point. Fortunately there is no sign of active infection systemically for certain. The patient is somewhat frustrated with how long this is taking I completely understand your frustration. Nonetheless I explained that wounds like this often do take a very long time to heal. 04/08/2019 upon evaluation today patient appears to be doing well with regard to her wound. This is not completely healed but does seem to be somewhat better. They have been using silver alginate at this point which is great news and overall the patient does seem to be making some progress. With that being said she did culture positive for MRSA unfortunately she is allergic to pretty much everything. I did want to discuss  with her and she did mention today that she be willing to try the linezolid she is never taken this before. Hopefully it will not cause any problems for her. 06/03/2019 upon evaluation today patient appears to be doing well with regard to her wound. She does have a new wound on her left lower extremity that occurred as a result of swelling of her legs when she was in the hospital he took off of her fluid pills and this made some problem here. With that being said fortunately this seems to be getting better and the patient states she is really not worried about this she has been putting antibiotic ointment on this at home and covering this with a drain seen which seems to be doing fairly well now that she is not draining as much. At one point her daughter was having to change the dressings much more frequently. Fortunately that has improved. Her swelling is better which is also good news. The patient was in the hospital from 05/04/2019 through 05/08/2019 where she had IV antibiotic therapy with vancomycin due to a C. difficile infection. She is better in regard to the infection but  still having a lot of weakness and tiredness in general. She did get the gentamicin and they have been using this at home as far as dressing changes are concerned. She tells me the hospital did not look at her wound at all during the time that she was there. 07/08/2019 upon evaluation today patient appears to be doing better in regard to her leg which is healed and that is excellent news. With regard to her wound in the right gluteal region this is still open and though it does not appear to be showing any signs of infection there is some dried gentamicin cream in the base of the wound. I think we probably do not need the gentamicin any longer based on what I am seeing she has a lot of new skin and epithelization on the sidewalls of the wound and I think this is good to heal with somewhat of a tunnel which is okay as long as it heals.  Again this is something we discussed in the past. 7/13; monthly follow-up. The area that we are looking at is in the upper right buttock. The patient and her daughter told me that this was originally an abscess that was IandD or biopsied by dermatology. The wound circumference is inclement quite dramatically since last time in fact this would be much too small to put silver alginate in. Electronic Signature(s) Signed: 08/11/2019 5:16:39 PM By: Linton Ham MD Entered By: Linton Ham on 08/11/2019 17:07:45 -------------------------------------------------------------------------------- Chemical Cauterization Details Patient Name: Date of Service: Penny Perez, Penny Perez 08/11/2019 3:00 PM Medical Record Number: [id number] Patient Account Number: [id number] Date of Birth/Sex: Treating RN: 1932-05-27 (86 y.o. [name] Primary Care Provider: [name] Other Clinician: Referring Provider: Treating Provider/Extender: Wende Mott in Treatment: 44 Procedure Performed for: Wound #1 Right Gluteus Performed By: Physician Ricard Dillon., MD Post Procedure Diagnosis Same as Pre-procedure Electronic Signature(s) Signed: 08/11/2019 5:16:39 PM By: Linton Ham MD Entered By: Linton Ham on 08/11/2019 17:06:48 -------------------------------------------------------------------------------- Physical Exam Details Patient Name: Date of Service: Penny Perez, Penny Perez 08/11/2019 3:00 PM Medical Record Number: [id number] Patient Account Number: [id number] Date of Birth/Sex: Treating RN: 1932/02/04 (86 y.o. [name] Primary Care Provider: [name] Other Clinician: Referring Provider: Treating Provider/Extender: Wende Mott in Treatment: 35 Constitutional Sitting or standing Blood Pressure is within target range for patient.. Pulse regular and within target range for patient.Marland Kitchen Respirations regular, non-labored  and within target range.. Temperature is normal and within the target range for the patient.Marland Kitchen Appears in no distress. Notes Wound examination; small open area. Barely big enough to put a skinny to pin. Measures about 1.3 cm of direct depth there is no drainage no surrounding erythema. Electronic Signature(s) Signed: 08/11/2019 5:16:39 PM By: Linton Ham MD Entered By: Linton Ham on 08/11/2019 17:08:36 -------------------------------------------------------------------------------- Physician Orders Details Patient Name: Date of Service: Penny Perez, Penny Perez 08/11/2019 3:00 PM Medical Record Number: [id number] Patient Account Number: [id number] Date of Birth/Sex: Treating RN: [birth year] (86 y.o. 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 Dressing Change Frequency  Wound #1 Right Gluteus Change dressing three times week. Wound Cleansing Wound #1 Right Gluteus Clean wound with Normal Saline. - may wipe out base with q-tip Primary Wound Dressing Wound #1 Right Gluteus Iodoflex Secondary Dressing Wound #1 Right Gluteus Foam Border Edema Control Elevate legs to the level of the heart or above for 30 minutes daily and/or when sitting, a frequency of: - throughout the day Exercise regularly Electronic Signature(s) Signed: 08/11/2019 5:16:39 PM By: Linton Ham MD Signed: 08/11/2019 5:57:23 PM By: Carlene Coria RN Entered By: Carlene Coria on 08/11/2019 16:47:33 -------------------------------------------------------------------------------- Problem List Details Patient Name: Date of Service: Penny, FEIN Perez 08/11/2019 3:00 PM Medical Record Number: [id number] Patient Account Number: [id number] Date of Birth/Sex: Treating RN: Nov 09, 1932 (86 y.o. 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 12/03/2018 No Yes I10 Essential (primary) hypertension 12/03/2018 No Yes I87.2 Venous insufficiency (chronic) (peripheral) 06/03/2019 No Yes L97.822 Non-pressure chronic ulcer of other part of left lower leg with fat layer exposed5/05/2019 No Yes Inactive Problems Resolved Problems Electronic Signature(s) Signed: 08/11/2019 5:16:39 PM By: Linton Ham MD Entered By: Linton Ham on 08/11/2019 17:06:14 -------------------------------------------------------------------------------- Progress Note Details Patient Name: Date of Service: Penny Perez 08/11/2019 3:00 PM Medical Record Number: [id number] Patient Account Number: [id number] Date of Birth/Sex: Treating RN: March 12, 1932 (86 y.o. [name] Primary Care Provider: [name] Other Clinician: Referring Provider: Treating Provider/Extender: Wende Mott in Treatment: 35 Subjective History of Present Illness (HPI) 12/03/2018 on evaluation today patient appears for initial inspection here in our clinic concerning an issue that she has been having a light having in the right gluteal region with an ulcer which initially she describes as  having been a knot which when she went to see the dermatologist was biopsied and this was on 08/26/2018. Subsequently that revealed what appeared to be an inflamed ulcer with granulation tissue. Fortunately there was no signs of active infection based on what they saw on pathology. The patient states that since the biopsy she has been having a lot of pain. Fortunately there is no signs of active infection at this time no fever chills noted. She does have a history of hypertension but really no major medical problems significant otherwise although she does have a lot of allergies including allergies to [facility] as well as antibiotics. In fact she has some ulcers in her mouth secondary to having been prescribed an antibiotic for the current ulcer. She states she has been having trouble sleeping due to the pain in the ulcer area. 12/10/2018 on evaluation today patient's wound actually showed signs of improvement as far as the loosening up of the necrotic tissue is concerned at this point. I do believe that that has done excellent at this point I think that we need to try and clear away some of the necrotic tissue in the base of the wound I discussed that with the patient as well. Fortunately she is not having any significant pain and states the Santyl did not burn although in the past she notes that it did cause her some trouble with the ulcer on her leg causing some burning. No fevers, chills, nausea, vomiting, or diarrhea. 12/17/2018 on evaluation today patient appears to be doing well with  regard to her wound. This does not appear to be any more deep than it was after the last evaluation and debridement last week. With that being said there are some necrotic tissue on the surface of the wound which is can require some sharp debridement at this point. Fortunately there is no evidence of active infection at this time which is good news. No fevers, chills, nausea, vomiting, or diarrhea. 12/31/2018 on  evaluation today patient appears to be doing a little better in regard to the overall appearance of her wound currently. She is tolerating the dressing changes without complication. Fortunately there is no signs of active infection at this time. With that being said she still has some necrotic tissue in the base of the wound that still needs to be removed this is very tough however and I was not able to debrided away last week. She really does not want me to perform any debridement today either due to how bad it hurt after I did this last time. With that being said I think we may be able to switch the dressing to something else to try to help with clearing this out a little better. 01/14/2019 upon evaluation today patient actually appears to be doing better with regard to her wound. She is showing signs of new granulation tissue and I do feel like that this is starting to improve. That is good news. Fortunately there is no evidence of active infection at this point. No fevers, chills, nausea, vomiting, or diarrhea. 12/30-Patient returns at 2 weeks, she does complain of some discomfort at night in the right gluteal area, she is obviously active and stays on her feet and not in a chair during the day. Denies any fevers chills shakes. Complains that the area on the ulcer feels wet fairly often 02/11/2019 on evaluation today patient appears to be doing okay with regard to her wound. She has been tolerating the dressing changes without complication. Fortunately there is no signs of active infection at this time. No fevers, chills, nausea, vomiting, or diarrhea. She does note however that she is having issues financially with having to come as frequently even as every other week. She states that she is not sure she is going to be able to continue to do so. Fortunately there is no signs of anything doing poorly I think we may be able to spread this out for her in order to help alleviate some of this especially  in light of the fact that she does have home health coming out. 03/11/2019 upon evaluation today patient appears to be doing really roughly the same with regard to her wound. This is measuring close to where it was previous although may be slightly smaller. Fortunately there is no evidence of active infection at this time obviously. With that being said this is not healing quite as quickly as I would like to see therefore I may actually perform a repeat culture today to see if there is anything that shows up at this point. Fortunately there is no sign of active infection systemically for certain. The patient is somewhat frustrated with how long this is taking I completely understand your frustration. Nonetheless I explained that wounds like this often do take a very long time to heal. 04/08/2019 upon evaluation today patient appears to be doing well with regard to her wound. This is not completely healed but does seem to be somewhat better. They have been using silver alginate at this point which is great news  and overall the patient does seem to be making some progress. With that being said she did culture positive for MRSA unfortunately she is allergic to pretty much everything. I did want to discuss with her and she did mention today that she be willing to try the linezolid she is never taken this before. Hopefully it will not cause any problems for her. 06/03/2019 upon evaluation today patient appears to be doing well with regard to her wound. She does have a new wound on her left lower extremity that occurred as a result of swelling of her legs when she was in the hospital he took off of her fluid pills and this made some problem here. With that being said fortunately this seems to be getting better and the patient states she is really not worried about this she has been putting antibiotic ointment on this at home and covering this with a drain seen which seems to be doing fairly well now that she is  not draining as much. At one point her daughter was having to change the dressings much more frequently. Fortunately that has improved. Her swelling is better which is also good news. The patient was in the hospital from 05/04/2019 through 05/08/2019 where she had IV antibiotic therapy with vancomycin due to a C. difficile infection. She is better in regard to the infection but still having a lot of weakness and tiredness in general. She did get the gentamicin and they have been using this at home as far as dressing changes are concerned. She tells me the hospital did not look at her wound at all during the time that she was there. 07/08/2019 upon evaluation today patient appears to be doing better in regard to her leg which is healed and that is excellent news. With regard to her wound in the right gluteal region this is still open and though it does not appear to be showing any signs of infection there is some dried gentamicin cream in the base of the wound. I think we probably do not need the gentamicin any longer based on what I am seeing she has a lot of new skin and epithelization on the sidewalls of the wound and I think this is good to heal with somewhat of a tunnel which is okay as long as it heals. Again this is something we discussed in the past. 7/13; monthly follow-up. The area that we are looking at is in the upper right buttock. The patient and her daughter told me that this was originally an abscess that was IandD or biopsied by dermatology. The wound circumference is inclement quite dramatically since last time in fact this would be much too small to put silver alginate in. Objective Constitutional Sitting or standing Blood Pressure is within target range for patient.. Pulse regular and within target range for patient.Marland Kitchen Respirations regular, non-labored and within target range.. Temperature is normal and within the target range for the patient.Marland Kitchen Appears in no distress. Vitals Time Taken:  4:14 PM, Height: 60 in, Weight: 115 lbs, BMI: 22.5, Temperature: 98.3 F, Pulse: 73 bpm, Respiratory Rate: 18 breaths/min, Blood Pressure: 131/73 mmHg. General Notes: Wound examination; small open area. Barely big enough to put a skinny to pin. Measures about 1.3 cm of direct depth there is no drainage no surrounding erythema. Integumentary (Hair, Skin) Wound #1 status is Open. Original cause of wound was Gradually Appeared. The wound is located on the Right Gluteus. The wound measures 0.2cm length x 0.3cm width x  1.5cm depth; 0.047cm^2 area and 0.071cm^3 volume. There is Fat Layer (Subcutaneous Tissue) Exposed exposed. There is no tunneling or undermining noted. There is a small amount of serosanguineous drainage noted. The wound margin is distinct with the outline attached to the wound base. There is small (1-33%) red granulation within the wound bed. There is no necrotic tissue within the wound bed. 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 An Chemical Cauterization procedure was performed by Ricard Dillon., MD. Post procedure Diagnosis Wound #1: Same as Pre-Procedure Plan Follow-up Appointments: Return appointment in 1 month. Dressing Change Frequency: Wound #1 Right Gluteus: Change dressing three times week. Wound Cleansing: Wound #1 Right Gluteus: Clean wound with Normal Saline. - may wipe out base with q-tip Primary Wound Dressing: Wound #1 Right Gluteus: Iodoflex Secondary Dressing: Wound #1 Right Gluteus: Foam Border Edema Control: Elevate  legs to the level of the heart or above for 30 minutes daily and/or when sitting, a frequency of: - throughout the day Exercise regularly #1 wound is now much too small in terms of the orifice to consider silver alginate. We are going to try Iodosorb ointment/Iodoflex to see if we can pull this together 2. I used a single silver nitrate also to see if we could cauterize some of the simple things together. 3. As mentioned the depth was about 1.3 cm today Electronic Signature(s) Signed: 08/11/2019 5:16:39 PM By: Linton Ham MD Entered By: Linton Ham on 08/11/2019 17:09:37 -------------------------------------------------------------------------------- SuperBill Details Patient Name: Date of Service: Penny Perez, Penny Perez [facility] 08/11/2019 Medical Record Number: [id number] Patient Account Number: [id number] Date of Birth/Sex: Treating RN: 04-Apr-1932 (86 y.o. 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 I10 Essential (primary) hypertension I87.2 Venous insufficiency (chronic) (peripheral) L97.822 Non-pressure chronic ulcer of other part of left lower leg with fat layer exposed Facility Procedures CPT4 Code: [id number] Description: 75170 - CHEM CAUT GRANULATION TISS ICD-10 Diagnosis Description 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

## 2019-08-12 ENCOUNTER — Encounter (HOSPITAL_BASED_OUTPATIENT_CLINIC_OR_DEPARTMENT_OTHER): Payer: Medicare Other | Admitting: Physician Assistant

## 2019-08-13 NOTE — Progress Notes (Signed)
Penny Perez, Penny Perez ([id number]) Visit Report for 08/11/2019 Arrival Information Details Patient Name: Date of Service: Penny Perez, Penny Perez Penny Perez 08/11/2019 3:00 PM Medical Record Number: [id number] Patient Account Number: [id number] Date of Birth/Sex: Treating RN: [birth year] (86 y.o. [name] Primary Care Caytlin Better: [name] Other Clinician: Referring Dorothey Oetken: Treating Delonta Yohannes/Extender: Wende Mott in Treatment: 45 Visit Information History Since Last Visit Added or deleted any medications: No Patient Arrived: Ambulatory Any new allergies or adverse reactions: No Arrival Time: 16:13 Had a fall or experienced change in No Accompanied By: daughter activities of daily living that may affect Transfer Assistance: None risk of falls: Patient Identification Verified: Yes Signs or symptoms of abuse/neglect since last visito No Secondary Verification Process Completed: Yes Hospitalized since last visit: No Patient Requires Transmission-Based Precautions: No Implantable device outside of the clinic excluding No Patient Has Alerts: No cellular tissue based products placed in the Perez since last visit: Has Dressing in Place as Prescribed: Yes Pain Present Now: Yes Electronic Signature(s) Signed: 08/13/2019 10:46:54 AM By: Sandre Kitty Entered By: Sandre Kitty on 08/11/2019 16:14:50 -------------------------------------------------------------------------------- Encounter Discharge Information Details Patient Name: Date of Service: [place] 08/11/2019 3:00 PM Medical Record Number: [id number] Patient Account Number: [id number] Date of Birth/Sex: Treating RN: Oct 19, 1932 (86 y.o. Penny Perez Primary Care Amiyrah Lamere: [name] Other Clinician: Referring Jacy Brocker: Treating Lexani Corona/Extender: Wende Mott in Treatment: 74 Encounter Discharge Information Items Discharge Condition: Stable Ambulatory  Status: Ambulatory Discharge Destination: Home Transportation: Private Auto Accompanied By: family member Schedule Follow-up Appointment: Yes Clinical Summary of Care: Patient Declined Electronic Signature(s) Signed: 08/11/2019 5:18:51 PM By: Kela Millin Entered By: Kela Millin on 08/11/2019 17:08:01 -------------------------------------------------------------------------------- Multi Wound Chart Details Patient Name: Date of Service: Penny Perez, Penny Perez 08/11/2019 3:00 PM Medical Record Number: [id number] Patient Account Number: [id number] Date of Birth/Sex: Treating RN: 1933-01-14 (86 y.o. [name] Primary Care Jalacia Mattila: [name] Other Clinician: Referring Seylah Wernert: Treating Ricki Clack/Extender: Wende Mott in Treatment: 35 Vital Signs Height(in): 60 Pulse(bpm): 47 Weight(lbs): 115 Blood Pressure(mmHg): 131/73 Body Mass Index(BMI): 22 Temperature(F): 98.3 Respiratory Rate(breaths/min): 18 Photos: [1:No Photos Right Gluteus] [N/A:N/A N/A] Wound Location: [1:Gradually Appeared] [N/A:N/A] Wounding Event: [1:Atypical] [N/A:N/A] Primary Etiology: [1:Cataracts, Asthma, Hypertension,] [N/A:N/A] Comorbid History: [1:Osteoarthritis 07/30/2018] [N/A:N/A] Date Acquired: [1:35] [N/A:N/A] Weeks of Treatment: [1:Open] [N/A:N/A] Wound Status: [1:0.2x0.3x1.5] [N/A:N/A] Measurements L x W x D (cm) [1:0.047] [N/A:N/A] A (cm) : rea [1:0.071] [N/A:N/A] Volume (cm) : [1:95.60%] [N/A:N/A] % Reduction in Area: [1:91.70%] [N/A:N/A] % Reduction in Volume: [1:Full Thickness Without Exposed] [N/A:N/A] Classification: [1:Support Structures Small] [N/A:N/A] Exudate Amount: [1:Serosanguineous] [N/A:N/A] Exudate Type: [1:red, brown] [N/A:N/A] Exudate Color: [1:Distinct, outline attached] [N/A:N/A] Wound Margin: [1:Small (1-33%)] [N/A:N/A] Granulation Amount: [1:Red] [N/A:N/A] Granulation Quality: [1:None Present (0%)] [N/A:N/A] Necrotic  Amount: [1:Fat Layer (Subcutaneous Tissue)] [N/A:N/A] Exposed Structures: [1:Exposed: Yes Fascia: No Tendon: No Muscle: No Joint: No Bone: No Small (1-33%)] [N/A:N/A] Epithelialization: [1:unable to visualize base of wound] [N/A:N/A] Assessment Notes: [1:Chemical Cauterization] [N/A:N/A] Treatment Notes Electronic Signature(s) Signed: 08/11/2019 5:16:39 PM By: Linton Ham MD Signed: 08/11/2019 5:57:23 PM By: Carlene Coria RN Entered By: Linton Ham on 08/11/2019 17:06:39 -------------------------------------------------------------------------------- Multi-Disciplinary Care Plan Details Patient Name: Date of Service: Penny Perez, Penny Perez 08/11/2019 3:00 PM Medical Record Number: [id number] Patient Account Number: [id number] Date of Birth/Sex: Treating RN: 1933/01/12 (86 y.o. [name] Primary Care Daemyn Gariepy: Other Clinician: [name] Referring Korver Graybeal: Treating Harjit Leider/Extender: Wende Mott in Treatment: 35 Active Inactive Wound/Skin Impairment Nursing Diagnoses: Impaired tissue integrity Knowledge  deficit related to ulceration/compromised skin integrity Goals: Patient/caregiver will verbalize understanding of skin care regimen Date Initiated: 12/03/2018 Target Resolution Date: 08/28/2019 Goal Status: Active Ulcer/skin breakdown will have a volume reduction of 30% by week 4 Date Initiated: 12/03/2018 Date Inactivated: 12/31/2018 Target Resolution Date: 12/31/2018 Goal Status: Unmet Unmet Reason: tunnel unroofed Ulcer/skin breakdown will have a volume reduction of 50% by week 8 Date Initiated: 12/31/2018 Date Inactivated: 01/28/2019 Target Resolution Date: 01/28/2019 Goal Status: Unmet Unmet Reason: multiple comorbidities Interventions: Assess patient/caregiver ability to obtain necessary supplies Assess patient/caregiver ability to perform ulcer/skin care regimen upon admission and as needed Assess ulceration(s) every  visit Treatment Activities: Skin care regimen initiated : 12/03/2018 Topical wound management initiated : 12/03/2018 Notes: Electronic Signature(s) Signed: 08/11/2019 5:57:23 PM By: Carlene Coria RN Entered By: Carlene Coria on 08/11/2019 16:06:24 -------------------------------------------------------------------------------- Pain Assessment Details Patient Name: Date of Service: Penny Perez, Penny Perez [facility] 08/11/2019 3:00 PM Medical Record Number: [id number] Patient Account Number: [id number] Date of Birth/Sex: Treating RN: December 04, 1932 (86 y.o. [name] Primary Care Carold Eisner: [name] Other Clinician: Referring Ikeem Cleckler: Treating Aunna Snooks/Extender: Wende Mott in Treatment: 35 Active Problems Location of Pain Severity and Description of Pain Patient Has Paino Yes Site Locations Rate the pain. Rate the pain. Current Pain Level: 6 Pain Management and Medication Current Pain Management: Electronic Signature(s) Signed: 08/11/2019 5:57:23 PM By: Carlene Coria RN Signed: 08/13/2019 10:46:54 AM By: Sandre Kitty Entered By: Sandre Kitty on 08/11/2019 16:15:15 -------------------------------------------------------------------------------- Patient/Caregiver Education Details Patient Name: Date of Service: Penny Perez, Penny Perez 7/13/2021andnbsp3:00 PM Medical Record Number: [id number] Patient Account Number: [id number] Date of Birth/Gender: Treating RN: [birth year] (86 y.o. [name] Primary Care Physician: [name] Other Clinician: Referring Physician: Treating Physician/Extender: Wende Mott in Treatment: 35 Education Assessment Education Provided To: Patient Education Topics Provided Wound/Skin Impairment: Methods: Explain/Verbal Responses: State content correctly Electronic Signature(s) Signed: 08/11/2019 5:57:23 PM By: Carlene Coria RN Entered By: Carlene Coria on 08/11/2019  16:06:38 -------------------------------------------------------------------------------- Wound Assessment Details Patient Name: Date of Service: Penny Perez, Penny Perez [facility] 08/11/2019 3:00 PM Medical Record Number: [id number] Patient Account Number: [id number] Date of Birth/Sex: Treating RN: [birth year] (86 y.o. [name] Primary Care Lajean Boese: [name] Other Clinician: Referring Shaunna Rosetti: Treating Valinda Fedie/Extender: Wende Mott in Treatment: 35 Wound Status Wound Number: 1 Primary Etiology: Atypical Wound Location: Right Gluteus Wound Status: Open Wounding Event: Gradually Appeared Comorbid History: Cataracts, Asthma, Hypertension, Osteoarthritis Date Acquired: 07/30/2018 Weeks Of Treatment: 35 Clustered Wound: No Wound Measurements Length: (cm) 0.2 Width: (cm) 0.3 Depth: (cm) 1.5 Area: (cm) 0.047 Volume: (cm) 0.071 % Reduction in Area: 95.6% % Reduction in Volume: 91.7% Epithelialization: Small (1-33%) Tunneling: No Undermining: No Wound Description Classification: Full Thickness Without Exposed Support Structures Wound Margin: Distinct, outline attached Exudate Amount: Small Exudate Type: Serosanguineous Exudate Color: red, brown Foul Odor After Cleansing: No Slough/Fibrino Yes Wound Bed Granulation Amount: Small (1-33%) Exposed Structure Granulation Quality: Red Fascia Exposed: No Necrotic Amount: None Present (0%) Fat Layer (Subcutaneous Tissue) Exposed: Yes Tendon Exposed: No Muscle Exposed: No Joint Exposed: No Bone Exposed: No Assessment Notes unable to visualize base of wound Treatment Notes Wound #1 (Right Gluteus) 1. Cleanse With Wound Cleanser 2. Periwound Care Skin Prep 3. Primary Dressing Applied Iodoflex 4. Secondary Dressing Foam Border Dressing Electronic Signature(s) Signed: 08/11/2019 5:34:53 PM By: Baruch Gouty RN, BSN Signed: 08/11/2019 5:57:23 PM By: Carlene Coria RN Entered By: Baruch Gouty on  08/11/2019 16:37:07 -------------------------------------------------------------------------------- Brethren Details Patient Name: Date of Service: Penny Perez, Penny Pal  Perez 08/11/2019 3:00 PM Medical Record Number: [id number] Patient Account Number: [id number] Date of Birth/Sex: Treating RN: March 05, 1932 (86 y.o. [name] Primary Care Nickholas Goldston: [name] Other Clinician: Referring Isebella Upshur: Treating Raina Sole/Extender: Wende Mott in Treatment: 35 Vital Signs Time Taken: 16:14 Temperature (F): 98.3 Height (in): 60 Pulse (bpm): 73 Weight (lbs): 115 Respiratory Rate (breaths/min): 18 Body Mass Index (BMI): 22.5 Blood Pressure (mmHg): 131/73 Reference Range: 80 - 120 mg / dl Electronic Signature(s) Signed: 08/13/2019 10:46:54 AM By: Sandre Kitty Entered By: Sandre Kitty on 08/11/2019 16:15:06

## 2019-08-19 ENCOUNTER — Emergency Department (HOSPITAL_COMMUNITY): Payer: Medicare Other

## 2019-08-19 ENCOUNTER — Inpatient Hospital Stay (HOSPITAL_COMMUNITY)
Admission: EM | Admit: 2019-08-19 | Discharge: 2019-08-28 | DRG: 683 | Disposition: A | Discharge status: Skilled Nursing Facility | Payer: Medicare Other | Attending: Internal Medicine | Admitting: Internal Medicine

## 2019-08-19 ENCOUNTER — Other Ambulatory Visit: Payer: Self-pay

## 2019-08-19 DIAGNOSIS — J45909 Unspecified asthma, uncomplicated: Secondary | ICD-10-CM | POA: Diagnosis present

## 2019-08-19 DIAGNOSIS — Z91040 Latex allergy status: Secondary | ICD-10-CM

## 2019-08-19 DIAGNOSIS — R432 Parageusia: Secondary | ICD-10-CM | POA: Diagnosis present

## 2019-08-19 DIAGNOSIS — N19 Unspecified kidney failure: Secondary | ICD-10-CM

## 2019-08-19 DIAGNOSIS — E861 Hypovolemia: Secondary | ICD-10-CM | POA: Diagnosis present

## 2019-08-19 DIAGNOSIS — I959 Hypotension, unspecified: Secondary | ICD-10-CM | POA: Diagnosis present

## 2019-08-19 DIAGNOSIS — Z79899 Other long term (current) drug therapy: Secondary | ICD-10-CM

## 2019-08-19 DIAGNOSIS — Z79890 Hormone replacement therapy (postmenopausal): Secondary | ICD-10-CM

## 2019-08-19 DIAGNOSIS — Z882 Allergy status to sulfonamides status: Secondary | ICD-10-CM

## 2019-08-19 DIAGNOSIS — Z7951 Long term (current) use of inhaled steroids: Secondary | ICD-10-CM

## 2019-08-19 DIAGNOSIS — S2231XA Fracture of one rib, right side, initial encounter for closed fracture: Secondary | ICD-10-CM

## 2019-08-19 DIAGNOSIS — Z881 Allergy status to other antibiotic agents status: Secondary | ICD-10-CM

## 2019-08-19 DIAGNOSIS — M797 Fibromyalgia: Secondary | ICD-10-CM | POA: Diagnosis present

## 2019-08-19 DIAGNOSIS — N179 Acute kidney failure, unspecified: Principal | ICD-10-CM

## 2019-08-19 DIAGNOSIS — W19XXXA Unspecified fall, initial encounter: Secondary | ICD-10-CM | POA: Diagnosis present

## 2019-08-19 DIAGNOSIS — N189 Chronic kidney disease, unspecified: Secondary | ICD-10-CM | POA: Diagnosis present

## 2019-08-19 DIAGNOSIS — I129 Hypertensive chronic kidney disease with stage 1 through stage 4 chronic kidney disease, or unspecified chronic kidney disease: Secondary | ICD-10-CM | POA: Diagnosis present

## 2019-08-19 DIAGNOSIS — E872 Acidosis, unspecified: Secondary | ICD-10-CM | POA: Diagnosis present

## 2019-08-19 DIAGNOSIS — Y92019 Unspecified place in single-family (private) house as the place of occurrence of the external cause: Secondary | ICD-10-CM | POA: Diagnosis not present

## 2019-08-19 DIAGNOSIS — L299 Pruritus, unspecified: Secondary | ICD-10-CM | POA: Diagnosis present

## 2019-08-19 DIAGNOSIS — Z800 Family history of malignant neoplasm of digestive organs: Secondary | ICD-10-CM | POA: Diagnosis not present

## 2019-08-19 DIAGNOSIS — Z90710 Acquired absence of both cervix and uterus: Secondary | ICD-10-CM

## 2019-08-19 DIAGNOSIS — E039 Hypothyroidism, unspecified: Secondary | ICD-10-CM | POA: Diagnosis present

## 2019-08-19 DIAGNOSIS — Z880 Allergy status to penicillin: Secondary | ICD-10-CM

## 2019-08-19 DIAGNOSIS — J9811 Atelectasis: Secondary | ICD-10-CM | POA: Diagnosis not present

## 2019-08-19 DIAGNOSIS — T502X5A Adverse effect of carbonic-anhydrase inhibitors, benzothiadiazides and other diuretics, initial encounter: Secondary | ICD-10-CM | POA: Diagnosis present

## 2019-08-19 DIAGNOSIS — E860 Dehydration: Secondary | ICD-10-CM | POA: Diagnosis present

## 2019-08-19 DIAGNOSIS — Z8041 Family history of malignant neoplasm of ovary: Secondary | ICD-10-CM

## 2019-08-19 DIAGNOSIS — R296 Repeated falls: Secondary | ICD-10-CM | POA: Diagnosis present

## 2019-08-19 DIAGNOSIS — N184 Chronic kidney disease, stage 4 (severe): Secondary | ICD-10-CM | POA: Diagnosis present

## 2019-08-19 DIAGNOSIS — K219 Gastro-esophageal reflux disease without esophagitis: Secondary | ICD-10-CM | POA: Diagnosis present

## 2019-08-19 DIAGNOSIS — Z9181 History of falling: Secondary | ICD-10-CM

## 2019-08-19 DIAGNOSIS — D509 Iron deficiency anemia, unspecified: Secondary | ICD-10-CM | POA: Diagnosis present

## 2019-08-19 DIAGNOSIS — M1990 Unspecified osteoarthritis, unspecified site: Secondary | ICD-10-CM | POA: Diagnosis present

## 2019-08-19 DIAGNOSIS — I10 Essential (primary) hypertension: Secondary | ICD-10-CM | POA: Diagnosis present

## 2019-08-19 DIAGNOSIS — F419 Anxiety disorder, unspecified: Secondary | ICD-10-CM | POA: Diagnosis present

## 2019-08-19 DIAGNOSIS — E871 Hypo-osmolality and hyponatremia: Secondary | ICD-10-CM | POA: Diagnosis present

## 2019-08-19 DIAGNOSIS — Z825 Family history of asthma and other chronic lower respiratory diseases: Secondary | ICD-10-CM

## 2019-08-19 DIAGNOSIS — Z20822 Contact with and (suspected) exposure to covid-19: Secondary | ICD-10-CM | POA: Diagnosis present

## 2019-08-19 DIAGNOSIS — R0989 Other specified symptoms and signs involving the circulatory and respiratory systems: Secondary | ICD-10-CM

## 2019-08-19 DIAGNOSIS — R52 Pain, unspecified: Secondary | ICD-10-CM

## 2019-08-19 DIAGNOSIS — Z885 Allergy status to narcotic agent status: Secondary | ICD-10-CM

## 2019-08-19 LAB — BASIC METABOLIC PANEL
Anion gap: 14 (ref 5–15)
Anion gap: 13 (ref 5–15)
BUN: 144 mg/dL — ABNORMAL HIGH (ref 8–23)
BUN: 136 mg/dL — ABNORMAL HIGH (ref 8–23)
CO2: 11 mmol/L — ABNORMAL LOW (ref 22–32)
CO2: 10 mmol/L — ABNORMAL LOW (ref 22–32)
Calcium: 9 mg/dL (ref 8.9–10.3)
Calcium: 8.8 mg/dL — ABNORMAL LOW (ref 8.9–10.3)
Chloride: 99 mmol/L (ref 98–111)
Chloride: 104 mmol/L (ref 98–111)
Creatinine, Ser: 5.85 mg/dL — ABNORMAL HIGH (ref 0.44–1.00)
Creatinine, Ser: 5.12 mg/dL — ABNORMAL HIGH (ref 0.44–1.00)
GFR calc Af Amer: 7 mL/min — ABNORMAL LOW (ref >60)
GFR calc Af Amer: 8 mL/min — ABNORMAL LOW (ref >60)
GFR calc non Af Amer: 6 mL/min — ABNORMAL LOW (ref >60)
GFR calc non Af Amer: 7 mL/min — ABNORMAL LOW (ref >60)
Glucose, Bld: 102 mg/dL — ABNORMAL HIGH (ref 70–99)
Glucose, Bld: 95 mg/dL (ref 70–99)
Potassium: 4.6 mmol/L (ref 3.5–5.1)
Potassium: 4.4 mmol/L (ref 3.5–5.1)
Sodium: 124 mmol/L — ABNORMAL LOW (ref 135–145)
Sodium: 127 mmol/L — ABNORMAL LOW (ref 135–145)

## 2019-08-19 LAB — HEPATIC FUNCTION PANEL
ALT: 19 U/L (ref 0–44)
AST: 31 U/L (ref 15–41)
Albumin: 3.5 g/dL (ref 3.5–5.0)
Alkaline Phosphatase: 75 U/L (ref 38–126)
Bilirubin, Direct: 0.1 mg/dL (ref 0.0–0.2)
Indirect Bilirubin: 0.9 mg/dL (ref 0.3–0.9)
Total Bilirubin: 1 mg/dL (ref 0.3–1.2)
Total Protein: 7.3 g/dL (ref 6.5–8.1)

## 2019-08-19 LAB — CBC
HCT: 30 % — ABNORMAL LOW (ref 36.0–46.0)
Hemoglobin: 9.8 g/dL — ABNORMAL LOW (ref 12.0–15.0)
MCH: 28.1 pg (ref 26.0–34.0)
MCHC: 32.7 g/dL (ref 30.0–36.0)
MCV: 86 fL (ref 80.0–100.0)
Platelets: 308 10*3/uL (ref 150–400)
RBC: 3.49 MIL/uL — ABNORMAL LOW (ref 3.87–5.11)
RDW: 15.2 % (ref 11.5–15.5)
WBC: 6.2 10*3/uL (ref 4.0–10.5)
nRBC: 0 % (ref 0.0–0.2)

## 2019-08-19 LAB — URINALYSIS, ROUTINE W REFLEX MICROSCOPIC
Bilirubin Urine: NEGATIVE
Glucose, UA: NEGATIVE mg/dL
Ketones, ur: NEGATIVE mg/dL
Leukocytes,Ua: NEGATIVE
Nitrite: NEGATIVE
Protein, ur: NEGATIVE mg/dL
Specific Gravity, Urine: 1.009 (ref 1.005–1.030)
pH: 5 (ref 5.0–8.0)

## 2019-08-19 LAB — CORTISOL: Cortisol, Plasma: 19.6 ug/dL

## 2019-08-19 LAB — OSMOLALITY: Osmolality: 312 mOsm/kg — ABNORMAL HIGH (ref 275–295)

## 2019-08-19 LAB — SARS CORONAVIRUS 2 BY RT PCR (HOSPITAL ORDER, PERFORMED IN ~~LOC~~ HOSPITAL LAB): SARS Coronavirus 2: NEGATIVE

## 2019-08-19 LAB — CREATININE, URINE, RANDOM: Creatinine, Urine: 78.48 mg/dL

## 2019-08-19 LAB — MAGNESIUM: Magnesium: 1.6 mg/dL — ABNORMAL LOW (ref 1.7–2.4)

## 2019-08-19 LAB — LACTIC ACID, PLASMA: Lactic Acid, Venous: 0.6 mmol/L (ref 0.5–1.9)

## 2019-08-19 LAB — TSH: TSH: 0.42 u[IU]/mL (ref 0.350–4.500)

## 2019-08-19 LAB — LIPASE, BLOOD: Lipase: 84 U/L — ABNORMAL HIGH (ref 11–51)

## 2019-08-19 LAB — OSMOLALITY, URINE: Osmolality, Ur: 266 mOsm/kg — ABNORMAL LOW (ref 300–900)

## 2019-08-19 LAB — SODIUM, URINE, RANDOM: Sodium, Ur: <10 mmol/L

## 2019-08-19 MED ORDER — OXYCODONE HCL 5 MG PO TABS
2.5000 mg | ORAL_TABLET | Freq: Three times a day (TID) | ORAL | Status: DC | PRN
  Administered 2019-08-20 – 2019-08-22 (×5): 2.5 mg via ORAL
  Filled 2019-08-19 (×6): qty 1

## 2019-08-19 MED ORDER — LACTATED RINGERS IV SOLN: INTRAVENOUS | Status: DC

## 2019-08-19 MED ORDER — LACTATED RINGERS IV BOLUS
1000.0000 mL | Freq: Once | INTRAVENOUS | Status: AC
  Administered 2019-08-19: 1000 mL via INTRAVENOUS

## 2019-08-19 MED ORDER — LEVOTHYROXINE SODIUM 100 MCG PO TABS
100.0000 ug | ORAL_TABLET | ORAL | Status: DC
  Administered 2019-08-19 – 2019-08-25 (×4): 100 ug via ORAL
  Filled 2019-08-19 (×6): qty 1

## 2019-08-19 MED ORDER — LORAZEPAM 2 MG/ML IJ SOLN: 0.5000 mg | Freq: Two times a day (BID) | INTRAMUSCULAR | Status: DC | PRN

## 2019-08-19 MED ORDER — HEPARIN SODIUM (PORCINE) 5000 UNIT/ML IJ SOLN
5000.0000 [IU] | Freq: Three times a day (TID) | INTRAMUSCULAR | Status: DC
  Administered 2019-08-19 – 2019-08-28 (×25): 5000 [IU] via SUBCUTANEOUS
  Filled 2019-08-19 (×25): qty 1

## 2019-08-19 MED ORDER — LORAZEPAM 1 MG PO TABS: 0.5000 mg | ORAL_TABLET | Freq: Two times a day (BID) | ORAL | Status: DC | PRN

## 2019-08-19 MED ORDER — FLUTICASONE FUROATE-VILANTEROL 100-25 MCG/INH IN AEPB
1.0000 | INHALATION_SPRAY | Freq: Every day | RESPIRATORY_TRACT | Status: DC
  Administered 2019-08-21: 1 via RESPIRATORY_TRACT
  Filled 2019-08-19 (×2): qty 28

## 2019-08-19 MED ORDER — SODIUM CHLORIDE 0.9 % IV BOLUS
500.0000 mL | Freq: Once | INTRAVENOUS | Status: AC
  Administered 2019-08-19: 500 mL via INTRAVENOUS

## 2019-08-19 MED ORDER — LORAZEPAM 0.5 MG PO TABS
0.5000 mg | ORAL_TABLET | Freq: Two times a day (BID) | ORAL | Status: DC | PRN
  Administered 2019-08-22 – 2019-08-27 (×7): 0.5 mg via ORAL
  Filled 2019-08-19 (×7): qty 1

## 2019-08-19 MED ORDER — ACETAMINOPHEN 325 MG PO TABS
325.0000 mg | ORAL_TABLET | Freq: Four times a day (QID) | ORAL | Status: DC | PRN
  Administered 2019-08-20 – 2019-08-21 (×3): 325 mg via ORAL
  Filled 2019-08-19 (×4): qty 1

## 2019-08-19 MED ORDER — POLYETHYLENE GLYCOL 3350 17 G PO PACK
17.0000 g | PACK | Freq: Every day | ORAL | Status: DC | PRN
  Administered 2019-08-24: 17 g via ORAL
  Filled 2019-08-19: qty 1

## 2019-08-19 MED ORDER — ALBUTEROL SULFATE (2.5 MG/3ML) 0.083% IN NEBU
2.5000 mg | INHALATION_SOLUTION | RESPIRATORY_TRACT | Status: DC | PRN
  Filled 2019-08-19: qty 3

## 2019-08-19 NOTE — ED Notes (Signed)
Pt asymptomatic hypotension at rest, pt blood pressure increased with arousal. Pt manual pulse steady and strong, pt easily aroused.

## 2019-08-19 NOTE — ED Provider Notes (Signed)
[facility] EMERGENCY DEPARTMENT Provider Note   CSN: [id number] Arrival date & time: 08/19/19  9629     History Chief Complaint  Patient presents with  . Fatigue    Penny Perez is a 86 y.o. female.  HPI  86 year old female presents with fatigue for several months, abdominal pain, nausea and vomiting over the last 48 hours.  Sent here by her PCPs office for critical lab values of creatinine of 5.57 and a BUN of 118 along with a sodium of 124.  History provided by the patient and daughter at bedside.  Patient reports that she overall just feels bad "and I cannot even put into words how bad I feel".  Daughter reports that the patient had a C. difficile diagnosis and was hospitalized back in April.  She states that since then, her health has not returned back to baseline.  She reports to not significant falls after her hospitalization, an patient complaining of back pain for several months.  [facility], but they declined an x-ray and stated that she had a musculoskeletal strain.  Over the last several months, the patient has reported not feeling well, weak, not as ambulatory as usual.  Over the last few days, she has lost appetite, and has had several episodes of nonbloody, nonbilious vomiting.  She reports decreased urination.  Was seen by her PCP who ordered labs, and critical creatinine, BUN and sodium were noted.  She denies any fevers or chills, but does endorse upper abdominal pain.  Denies any hematochezia, dysuria or hematuria.  No acute confusion, the patient's daughter states that sometimes she will forget what she is doing.  No chest pain or shortness of breath.  Patient states that she is normally on Lasix for leg swelling.  She was taken off of it during her hospital stay, but this was resumed after discharge.    Past Medical History:  Diagnosis Date  . ALLERGIC RHINITIS   . Asthma   . Bronchitis   . DDD (degenerative disc disease),  cervical   . Esophageal reflux   . Fibromyalgia   . Osteoarthritis   . Scoliosis   . Unspecified essential hypertension     Patient Active Problem List   Diagnosis Date Noted  . Acute on chronic renal failure ([place]) 05/05/2019  . Essential hypertension 05/05/2019  . C. difficile colitis 05/05/2019  . Scoliosis of thoracic spine 05/05/2019  . Neck pain 05/05/2019  . Anxiety 05/05/2019  . Muscle spasms of neck 05/05/2019  . Acute renal failure superimposed on stage 3 chronic kidney disease ([place]) 05/04/2019  . Hormone replacement therapy (HRT) 03/22/2014  . HYPERTENSION 12/07/2009  . Acute bronchitis 12/07/2009  . ALLERGIC RHINITIS 12/07/2009  . OSTEOARTHRITIS, MULTIPLE JOINTS 12/07/2009  . CAROTID BRUIT, LEFT 12/07/2009    Past Surgical History:  Procedure Laterality Date  . APPENDECTOMY    . BACK SURGERY  2013   lumb fusion  . COLONOSCOPY    . ERCP    . ORIF TOE FRACTURE  02/27/2012   Procedure: OPEN REDUCTION INTERNAL FIXATION (ORIF) METATARSAL (TOE) FRACTURE;  Surgeon: Colin Rhein, MD;  Location: [place];  Service: Orthopedics;  Laterality: Right;  RIGHT OPEN REDUCTION INTERNAL FIXATION NON UNION 1ST METATARSAL NECK,FLEXOR HALLUCIS LONGUS TO PROXIMAL PHALANAX TENDON TRANSFER,  GASTROC SLIDE,  LOCAL BONE GRAFT   . SHOULDER ARTHROSCOPY  2011   rt  . TENDON TRANSFER  02/27/2012   Procedure: TENDON TRANSFER;  Surgeon: Colin Rhein, MD;  Location: [place];  Service: Orthopedics;  Laterality: Right;  . TOTAL ABDOMINAL HYSTERECTOMY    . TOTAL ABDOMINAL HYSTERECTOMY W/ BILATERAL SALPINGOOPHORECTOMY    . TOTAL HIP ARTHROPLASTY  03,09   rt and lt  . TOTAL HIP REVISION       OB History   No obstetric history on file.     Family History  Problem Relation Age of Onset  . Asthma Mother   . Ovarian cancer Mother   . Pancreatic cancer Father     Social History   Tobacco Use  . Smoking status: Never Smoker  . Smokeless tobacco: Never  Used  Substance Use Topics  . Alcohol use: No  . Drug use: No    Home Medications Prior to Admission medications   Medication Sig Start Date End Date Taking? Authorizing Provider  albuterol (PROAIR HFA) 108 (90 BASE) MCG/ACT inhaler 2 puffs every 4 hours if needed- rescue inhaler Patient taking differently: Inhale 2 puffs into the lungs every 4 (four) hours as needed for wheezing or shortness of breath.  11/01/11 08/19/19 Yes Young, Tarri Fuller D, MD  budesonide-formoterol (SYMBICORT) 80-4.5 MCG/ACT inhaler Inhale 2 puffs into the lungs 2 (two) times daily. Rinse mouth 11/01/11 08/19/19 Yes Young, Tarri Fuller D, MD  Cholecalciferol (VITAMIN D3) 1000 UNITS CAPS Take 1 capsule by mouth daily.     Yes [provider]  conjugated estrogens (PREMARIN) vaginal cream USE AS DIRECTED Patient taking differently: Place 1 Applicatorful vaginally See admin instructions. Use as directed 05/05/15  Yes Dena Billet B, PA-C  cromolyn (NASALCROM) 5.2 MG/ACT nasal spray Place 1 spray into the nose 4 (four) times daily as needed for allergies or rhinitis.    Yes [provider]  diazepam (VALIUM) 10 MG tablet Take 1 tablet by mouth as needed for anxiety or sleep.  09/04/12  Yes [provider]  estradiol (VIVELLE-DOT) 0.05 MG/24HR patch APPLY 1 PATCH EXTERNALLY TO THE SKIN EVERY 72 HOURS Patient taking differently: Place 1 patch onto the skin every 3 (three) days.  04/10/16  Yes Dena Billet B, PA-C  fish oil-omega-3 fatty acids 1000 MG capsule Take 1 g by mouth daily.     Yes [provider]  Flaxseed, Linseed, (FLAX SEED OIL) 1000 MG CAPS Take by mouth.     Yes [provider]  fluocinonide cream (LIDEX) 7.61 % Apply 1 application topically 2 (two) times daily.  04/23/19  Yes [provider]  fluticasone (FLONASE) 50 MCG/ACT nasal spray USE 1 SPRAY IN EACH NOSTRIL EVERY DAY AS DIRECTED 01/29/13  Yes [provider]  furosemide (LASIX) 40 MG tablet Take 20 mg by mouth  daily as needed.   Yes [provider]  hyoscyamine (LEVSIN, ANASPAZ) 0.125 MG tablet Take 0.125 mg by mouth every 6 (six) hours as needed for bladder spasms or cramping.  08/12/17  Yes [provider]  levothyroxine (SYNTHROID, LEVOTHROID) 100 MCG tablet Take 100 mcg by mouth every other day.    Yes [provider]  LINZESS 290 MCG CAPS capsule Take 1 capsule (290 mcg total) by mouth daily as needed (constipation). 05/08/19  Yes Rai, Ripudeep K, MD  loratadine (CLARITIN) 10 MG tablet Take 10 mg by mouth daily as needed for allergies.    Yes [provider]  MOVANTIK 25 MG TABS tablet Take 1 tablet (25 mg total) by mouth daily as needed (constipation). 05/08/19  Yes Rai, Ripudeep K, MD  nystatin (MYCOSTATIN) powder  Apply topically 2 (two) times daily. To effected areas Patient taking differently: Apply topically 2 (two) times daily as needed (skin irritation). To effected areas 12/01/14  Yes Dena Billet B, PA-C  omeprazole (PRILOSEC) 40 MG capsule Take 40 mg by mouth daily.  01/21/14  Yes [provider]  oxyCODONE-acetaminophen (PERCOCET) 10-325 MG tablet Take 0.5 tablets by mouth 3 (three) times daily as needed for pain.  10/25/17  Yes [provider]  SYNTHROID 112 MCG tablet Take 112 mcg by mouth every other day. 07/17/19  Yes [provider]  methocarbamol (ROBAXIN) 500 MG tablet Take 1 tablet (500 mg total) by mouth every 8 (eight) hours as needed for muscle spasms. Patient not taking: Reported on 08/19/2019 05/08/19   Rai, Vernelle Emerald, MD  olmesartan-hydrochlorothiazide (BENICAR HCT) 20-12.5 MG tablet Take 1 tablet by mouth daily. 07/29/19   [provider]  potassium chloride (KLOR-CON) 10 MEQ tablet Take 10 mEq by mouth daily. 07/02/19   [provider]  Spacer/Aero-Holding Chambers (AEROCHAMBER MV) inhaler by Other route. Use as instructed     [provider]    Allergies    Linezolid, Sulfa antibiotics,  Ampicillin, Cephalexin, Clarithromycin, Codeine, Doxycycline, Erythromycin, Gatifloxacin, Hydrocodone, Penicillins, Latex, and Tape  Review of Systems   Review of Systems  Constitutional: Positive for activity change, appetite change and fatigue. Negative for fever.  Respiratory: Negative for cough.   Cardiovascular: Negative for chest pain.  Gastrointestinal: Positive for abdominal pain, nausea and vomiting. Negative for constipation and diarrhea.  Genitourinary: Negative for dysuria, hematuria and pelvic pain.  Musculoskeletal: Positive for back pain and myalgias. Negative for joint swelling.  Neurological: Positive for weakness.  All other systems reviewed and are negative.   Physical Exam Updated Vital Signs BP (!) 112/44   Pulse 70   Temp 97.9 F (36.6 C) (Oral)   Resp 13   SpO2 97%   Physical Exam Vitals and nursing note reviewed.  Constitutional:      General: She is not in acute distress.    Appearance: Normal appearance. She is well-developed. She is not ill-appearing, toxic-appearing or diaphoretic.  HENT:     Head: Normocephalic and atraumatic.     Mouth/Throat:     Mouth: Mucous membranes are moist.     Pharynx: Oropharynx is clear.  Eyes:     Conjunctiva/sclera: Conjunctivae normal.  Cardiovascular:     Rate and Rhythm: Normal rate and regular rhythm.     Pulses: Normal pulses.     Heart sounds: Murmur heard.   Pulmonary:     Effort: Pulmonary effort is normal. No respiratory distress.     Breath sounds: Normal breath sounds.  Abdominal:     General: Abdomen is flat. There is distension.     Palpations: Abdomen is soft.     Tenderness: There is no abdominal tenderness. There is no right CVA tenderness or left CVA tenderness.     Comments: Right upper and left upper abdominal tenderness  Musculoskeletal:        General: Swelling present. No tenderness. Normal range of motion.     Cervical back: Normal range of motion and neck supple.     Right lower leg:  Edema present.     Left lower leg: Edema present.     Comments: Moderate nonpitting edema to her lower extremities bilaterally.  No calf swelling or redness.  Gross sensations intact.  5/5 strength in upper and lower extremities.  No midline C, T, L-spine tenderness  Skin:  General: Skin is warm and dry.     Findings: No bruising, erythema or rash.  Neurological:     General: No focal deficit present.     Mental Status: She is alert and oriented to person, place, and time.     Sensory: No sensory deficit.     Motor: No weakness.     ED Results / Procedures / Treatments   Labs (all labs ordered are listed, but only abnormal results are displayed) Labs Reviewed  CBC - Abnormal; Notable for the following components:      Result Value   RBC 3.49 (*)    Hemoglobin 9.8 (*)    HCT 30.0 (*)    All other components within normal limits  BASIC METABOLIC PANEL - Abnormal; Notable for the following components:   Sodium 124 (*)    CO2 11 (*)    Glucose, Bld 102 (*)    BUN 144 (*)    Creatinine, Ser 5.85 (*)    GFR calc non Af Amer 6 (*)    GFR calc Af Amer 7 (*)    All other components within normal limits  URINALYSIS, ROUTINE W REFLEX MICROSCOPIC - Abnormal; Notable for the following components:   APPearance HAZY (*)    Hgb urine dipstick SMALL (*)    Bacteria, UA RARE (*)    All other components within normal limits  LIPASE, BLOOD - Abnormal; Notable for the following components:   Lipase 84 (*)    All other components within normal limits  LACTIC ACID, PLASMA  LACTIC ACID, PLASMA    EKG None  Radiology CT ABDOMEN PELVIS WO CONTRAST  Result Date: 08/19/2019 CLINICAL DATA:  Right lower quadrant abdominal pain. EXAM: CT ABDOMEN AND PELVIS WITHOUT CONTRAST TECHNIQUE: Multidetector CT imaging of the abdomen and pelvis was performed following the standard protocol without IV contrast. COMPARISON:  CT of the abdomen pelvis 05/04/2019 FINDINGS: Lower chest: Linear atelectasis and  scarring is again noted at both lung bases. No nodule or mass lesion is present. No significant consolidation is present. Atherosclerotic changes are noted at the aortic valve. Heart size is normal. No significant pleural or pericardial effusion is present. Hepatobiliary: Liver is within normal limits. Calcifications in the porta hepatis are stable. Gallbladder is unremarkable. Pancreas: Atrophic no focal lesions. Spleen: Within normal limits. Adrenals/Urinary Tract: The adrenal glands are normal bilaterally. Kidneys and ureters are normal. The urinary bladder is within normal limits. Stomach/Bowel: The stomach and duodenum are within normal limits. Small bowel is unremarkable. Terminal ileum is within normal limits. Calcification in the small bowel mesentery of the left upper quadrant is stable. The appendix is not discretely visualized and may be surgically absent. The ascending transverse colon are within normal limits. Descending and sigmoid colon are normal. Vascular/Lymphatic: Atherosclerotic calcifications are present in the aorta and branch vessels without aneurysm. No significant retroperitoneal or pelvic adenopathy is present. No significant para-aortic nodes are present. Reproductive: Uterus and bilateral adnexa are unremarkable. Other: Paraumbilical hernia contains fat without bowel. No other significant ventral hernias are present. No significant free fluid or free air is present. Musculoskeletal: Levoconvex scoliosis is again noted. Posterior lumbar fusion is noted at L4-5 vertebral body heights are normal. Grade 1 anterolisthesis present at L3-4, L4-5, and L5-S1. Bilateral total hip arthroplasties are present. IMPRESSION: 1. No acute or focal lesion to explain the patient's symptoms. 2. Paraumbilical hernia contains fat without bowel. 3. Aortic Atherosclerosis (ICD10-I70.0). Electronically Signed   By: Wynetta Fines.D.  On: 08/19/2019 13:14   DG Ribs Unilateral Right  Result Date:  08/19/2019 CLINICAL DATA:  Fall in April. Questionable healing rib fractures on portable chest x-ray. Kyphosis. EXAM: RIGHT RIBS - 2 VIEW COMPARISON:  08/19/2019 FINDINGS: Remote RIGHT shoulder arthroplasty. There is callus surrounding subacute fractures of the RIGHT LATERAL ribs 7-9. Fracture of the RIGHT 6th rib is acute or subacute. No pneumothorax. Thoracolumbar scoliosis. IMPRESSION: 1. Subacute fractures of RIGHT ribs 7-9. 2. Acute or subacute fracture of the RIGHT 6th rib. Electronically Signed   By: Nolon Nations M.D.   On: 08/19/2019 12:29   DG Chest Portable 1 View  Result Date: 08/19/2019 CLINICAL DATA:  Weakness and fatigue for several months EXAM: PORTABLE CHEST 1 VIEW COMPARISON:  Portable exam 1019 hours compared to 05/04/2019 FINDINGS: Normal heart size, mediastinal contours, and pulmonary vascularity. Atherosclerotic calcification aorta. Bronchitic changes without pulmonary infiltrate, pleural effusion or pneumothorax. Osseous demineralization with advanced thoracolumbar scoliosis. Nondisplaced fracture of the lateral RIGHT sixth rib. Nodular density identified in the mid to lower RIGHT lung, potentially representing callus at a healing fracture of the posterior RIGHT seventh rib though pulmonary nodule not entirely excluded; recommend dedicated rib radiographs to evaluate. RIGHT shoulder prosthesis. IMPRESSION: Bronchitic changes without infiltrate. Nondisplaced fracture of lateral RIGHT sixth rib with question healing fracture of the posterior RIGHT seventh rib accounting for a nodular density in the LEFT lung; recommend dedicated RIGHT rib radiographs to confirm callus at a healing seventh rib fracture and exclude pulmonary nodule. Electronically Signed   By: Lavonia Dana M.D.   On: 08/19/2019 11:05    Procedures Procedures (including critical care time)  Medications Ordered in ED Medications  sodium chloride 0.9 % bolus 500 mL (0 mLs Intravenous Stopped 08/19/19 1250)  sodium  chloride 0.9 % bolus 500 mL (500 mLs Intravenous New Bag/Given 08/19/19 1436)    ED Course  I have reviewed the triage vital signs and the nursing notes.  Pertinent labs & imaging results that were available during my care of the patient were reviewed by me and considered in my medical decision making (see chart for details).    MDM Rules/Calculators/A&P                         86 year old female with weakness over the last 2 months, worsening abdominal pain, nausea, vomiting over the last few days On presentation, the patient is alert, oriented x3, nontoxic-appearing, no acute distress, speaking full sentences without increased work of breathing, nondiaphoretic.  Her vitals on presentation showed soft blood pressures with an initial blood pressure of 105/50, but other vitals reassuring.  Her blood pressures have fluctuated throughout the ED course, is bonding well to the 500 cc bolus of fluids, however then dropping.  Patient states that she had not taken her blood pressure medications today.  Her lactate is negative, and she does not meet sepsis criteria.  I do not think that this is contributing to her hypotension.  She received another 500 cc bolus of fluids which improved her vitals.  Physical exam with upper abdominal tenderness in the right and left quadrants.  She does have a murmur, though the patient states that this is at baseline.  I ordered and personally interpreted her lab work, sodium of 124, normal potassium, however most notably she had a creatinine of 5.5 and a BUN of 144.  In comparison, 3 months ago her creatinine was 1.4 with a BUN of 144.  GFR of 6.  Negative lactic, lipase slightly elevated at 84, however does not meet pancreatitis criteria.  UA without evidence of UTI or hematuria.  Negative Covid.  CT of the abdomen without any acute intra-abdominal abnormality.  Her chest x-ray noted a patient nondisplaced fracture of the lateral right sixth rib with questionable healing  fracture of the posterior right has evidence of AKI on blood work. They recommended dedicated right rib radiographs, which I ordered.  Plain films of the right rib noted a acute or subacute sixth right rib fracture.  Given the patient has not had any recent falls, I believe that this is be subacute.  She will require incentive spirometry and close monitoring. Consulted nephrology and made them aware of the patient's increase in creatinine.  They asked for me to order ultrasound of her kidneys.  Consulted hospitalist for admission for management of her AKI.  Other than her fluctuating blood pressures, the patient has remained hemodynamically stable otherwise.  Patient was seen and evaluated by Dr. Vallery Ridge who is agreeable to the above plan and disposition.  Final Clinical Impression(s) / ED Diagnoses Final diagnoses:  Pain    Rx / DC Orders ED Discharge Orders    None       Lyndel Safe 08/20/19 1252    Charlesetta Shanks, MD 08/26/19 [phone]

## 2019-08-19 NOTE — ED Notes (Signed)
Pt transported to [facility]

## 2019-08-19 NOTE — ED Notes (Signed)
Pure wick has been placed. Suction set to 45mmHg.  

## 2019-08-19 NOTE — H&P (Signed)
Date: 08/19/2019               Patient Name:  Penny Perez MRN: [id number]  DOB: 21-Oct-1932 Age / Sex: 86 y.o., female   PCP: Chesley Noon, MD         Medical Service: Internal Medicine Teaching Service         Attending Physician: Dr. Sid Falcon, MD    First Contact: Dr. Alfonse Spruce Pager: [phone]  Second Contact: Dr. Eileen Stanford Pager: [phone]       After Hours (After 5p/  First Contact Pager: [phone]  weekends / holidays): Second Contact Pager: [phone]   Chief Complaint: Fatigue  History of Present Illness: Penny Perez is an 86 year old woman with PMHx of HTN, hypothyroidism, fibromyalgia, OA, GERD, DDD who presented to the ED with fatigue. Patient states that she has been feeling weak in the past few months after her hospitalization in April. She was admitted in April for C. Dif colitis. Patient also complains of abdominal pain associated with nausea and vomiting. Her appetite is poor due to altered taste and her fluid  intake is also down.  She has not had good urine either. Patient states that she has been taking her Olmesartan-HCTZ for HTN but was stopped by the PCP recently. She is also taking Lasix intermittently for LE edema. She denies chest pain, SOB, diarrhea, fever.  Patient states that she had several falls after her hospitalization in April, which hurt her right flank. Patient went to urgent but CXR was not performed at that time.   ED course: Sodium 124, serum os 312, Bun 144, Creatine 5.8 (baseline 1.4), lactic acid 0.6, Lipase 84. Blood pressure was 105/50 on admission so 1L bolus Lactated Ringer and 1L of NS was given. Cortisol 19.6. Her AKI on CKD is likely pre-renal, which is caused by dehydration 2/2 poor PO intake on top of HCTZ and Lasix used. Nephrology is onboard with the treatment team.  Meds:  Current Meds  Medication Sig  . albuterol (PROAIR HFA) 108 (90 BASE) MCG/ACT inhaler 2 puffs every 4 hours if needed- rescue inhaler (Patient taking  differently: Inhale 2 puffs into the lungs every 4 (four) hours as needed for wheezing or shortness of breath. )  . budesonide-formoterol (SYMBICORT) 80-4.5 MCG/ACT inhaler Inhale 2 puffs into the lungs 2 (two) times daily. Rinse mouth  . Cholecalciferol (VITAMIN D3) 1000 UNITS CAPS Take 1 capsule by mouth daily.    Marland Kitchen conjugated estrogens (PREMARIN) vaginal cream USE AS DIRECTED (Patient taking differently: Place 1 Applicatorful vaginally See admin instructions. Use as directed)  . cromolyn (NASALCROM) 5.2 MG/ACT nasal spray Place 1 spray into the nose 4 (four) times daily as needed for allergies or rhinitis.   Marland Kitchen diazepam (VALIUM) 10 MG tablet Take 1 tablet by mouth as needed for anxiety or sleep.   Marland Kitchen estradiol (VIVELLE-DOT) 0.05 MG/24HR patch APPLY 1 PATCH EXTERNALLY TO THE SKIN EVERY 72 HOURS (Patient taking differently: Place 1 patch onto the skin every 3 (three) days. )  . fish oil-omega-3 fatty acids 1000 MG capsule Take 1 g by mouth daily.    . Flaxseed, Linseed, (FLAX SEED OIL) 1000 MG CAPS Take by mouth.    . fluocinonide cream (LIDEX) 1.94 % Apply 1 application topically 2 (two) times daily.   . fluticasone (FLONASE) 50 MCG/ACT nasal spray USE 1 SPRAY IN EACH NOSTRIL EVERY DAY AS DIRECTED  . furosemide (LASIX) 40 MG tablet Take 20 mg by mouth  daily as needed for fluid or edema.   . hyoscyamine (LEVSIN, ANASPAZ) 0.125 MG tablet Take 0.125 mg by mouth every 6 (six) hours as needed for bladder spasms or cramping.   Marland Kitchen levothyroxine (SYNTHROID, LEVOTHROID) 100 MCG tablet Take 100 mcg by mouth every other day.   Marland Kitchen LINZESS 290 MCG CAPS capsule Take 1 capsule (290 mcg total) by mouth daily as needed (constipation).  Marland Kitchen loratadine (CLARITIN) 10 MG tablet Take 10 mg by mouth daily as needed for allergies.   Marland Kitchen MOVANTIK 25 MG TABS tablet Take 1 tablet (25 mg total) by mouth daily as needed (constipation).  . nystatin (MYCOSTATIN) powder Apply topically 2 (two) times daily. To effected areas (Patient  taking differently: Apply topically 2 (two) times daily as needed (skin irritation). To effected areas)  . omeprazole (PRILOSEC) 40 MG capsule Take 40 mg by mouth daily.   Marland Kitchen oxyCODONE-acetaminophen (PERCOCET) 10-325 MG tablet Take 0.5 tablets by mouth 3 (three) times daily as needed for pain.   Marland Kitchen SYNTHROID 112 MCG tablet Take 112 mcg by mouth every other day.     Allergies: Allergies as of 08/19/2019 - Review Complete 08/19/2019  Allergen Reaction Noted  . Linezolid Nausea And Vomiting 04/21/2019  . Sulfa antibiotics  09/16/2014  . Ampicillin Other (See Comments) 12/07/2009  . Cephalexin Other (See Comments)   . Clarithromycin Other (See Comments)   . Codeine Other (See Comments)   . Doxycycline Other (See Comments)   . Erythromycin Other (See Comments) 03/22/2014  . Gatifloxacin Other (See Comments) 03/22/2014  . Hydrocodone Other (See Comments) 03/22/2014  . Penicillins Other (See Comments)   . Latex Rash 08/19/2019  . Tape Rash 08/19/2019   Past Medical History:  Diagnosis Date  . ALLERGIC RHINITIS   . Asthma   . Bronchitis   . DDD (degenerative disc disease), cervical   . Esophageal reflux   . Fibromyalgia   . Osteoarthritis   . Scoliosis   . Unspecified essential hypertension     Family History:  Dad: cancer Mom: cancer Daughter: CHF  Social History:  Smoking: no Alcohol: occasional  Review of Systems: A complete ROS was negative except as per HPI.   Physical Exam: Blood pressure (!) 82/51, pulse 73, temperature 97.9 F (36.6 C), temperature source Oral, resp. rate 12, SpO2 (!) 79 %.  Physical Exam Constitutional:      General: She is not in acute distress.    Comments: Hard to hear  HENT:     Head: Normocephalic.  Eyes:     General: No scleral icterus.    Conjunctiva/sclera: Conjunctivae normal.     Pupils: Pupils are equal, round, and reactive to light.  Cardiovascular:     Rate and Rhythm: Normal rate and regular rhythm.     Heart sounds:  Murmur (3/6 systolic murmur at right upper sternal border) heard.   Pulmonary:     Effort: Pulmonary effort is normal. No respiratory distress.  Abdominal:     General: Bowel sounds are normal. There is no distension.     Palpations: Abdomen is soft.     Tenderness: There is no abdominal tenderness. There is no guarding.  Musculoskeletal:     Right lower leg: Edema (trace edema mid tibia) present.     Left lower leg: Edema (trace edema mid tibia) present.  Skin:    Coloration: Skin is not jaundiced.     Findings: Bruising (bilateral LE) present.  Neurological:     Mental Status: She is alert.  Motor: Weakness present.     Comments: Strength UE 3/5 bilaterally.   Psychiatric:        Mood and Affect: Mood normal.        Thought Content: Thought content normal.    Lab CBC Latest Ref Rng & Units 08/19/2019 05/08/2019 05/07/2019  WBC 4.0 - 10.5 K/uL 6.2 8.6 14.6(H)  Hemoglobin 12.0 - 15.0 g/dL 9.8(L) 8.2(L) 8.8(L)  Hematocrit 36 - 46 % 30.0(L) 25.6(L) 26.7(L)  Platelets 150 - 400 K/uL 308 268 276   CMP Latest Ref Rng & Units 08/19/2019 05/08/2019 05/07/2019  Glucose 70 - 99 mg/dL 102(H) 95 95  BUN 8 - 23 mg/dL 144(H) 29(H) 42(H)  Creatinine 0.44 - 1.00 mg/dL 5.85(H) 1.40(H) 1.54(H)  Sodium 135 - 145 mmol/L 124(L) 135 132(L)  Potassium 3.5 - 5.1 mmol/L 4.6 3.6 3.6  Chloride 98 - 111 mmol/L 99 107 106  CO2 22 - 32 mmol/L 11(L) 19(L) 18(L)  Calcium 8.9 - 10.3 mg/dL 9.0 8.0(L) 7.8(L)  Total Protein 6.5 - 8.1 g/dL 7.3 - 4.8(L)  Total Bilirubin 0.3 - 1.2 mg/dL 1.0 - 0.6  Alkaline Phos 38 - 126 U/L 75 - 67  AST 15 - 41 U/L 31 - 16  ALT 0 - 44 U/L 19 - 13     CXR: Bronchitic changes without infiltrate. Nondisplaced fracture of lateral RIGHT sixth rib with question healing fracture of the posterior RIGHT seventh rib accounting for a nodular density in the LEFT lung; recommend dedicated RIGHT rib radiographs to confirm callus at a healing seventh rib fracture and exclude pulmonary  nodule.  CT abd/pelvis: No acute or focal lesion to explain the patient's symptoms. Paraumbilical hernia contains fat without bowel. Aortic Atherosclerosis    Renal US: Diminutive, likely atrophic kidneys bilaterally More pronounced cortical thinning and increased renal cortical echogenicity of the right kidney compatible with medical renal Disease. Otherwise unremarkable renal ultrasound.  Assessment & Plan by Problem: Penny Perez is an 86 year old woman with PMHx of HTN, hypothyroidism, fibromyalgia, OA, GERD, DDD who presented to the ED with fatigue, found to have AKI on CKD and hyponatremia.    Principal Problem:   Acute on chronic renal failure w/ Uremia (HCC) Active Problems:   Essential hypertension   Anxiety   Hyperosmolar Hyponatremia   Normal anion gap metabolic acidosis   Right rib fracture   Acute kidney injury on CKD Patient has history of CKD stage 4 and baseline creatine is 1.4. Creatine on admission is 5.8. Renal US is negative for obstruction or stone. This is likely Uremia secondary to dehydration 2/2 poor PO intake on top of HCTZ and Lasix used. FeNa calculated 0.5 % which is consistent with pre-renal. Patient received 1L of LR and 1L of NS. Nephrology is onboard with the treatment plan. Will continue to hydrate patient and monitor BMP Q4h. If her renal function does not improve tomorrow, we will consider dialysis per nephrology.  - BMP q4h  - Recheck sodium.  - Strict I/O - Daily weight - Continue LR 100 cc/h  - Hold her Lasix and HCTZ - Catheter in place - NPO after midnight   Hypertonic Hyponatremia Sodium on admission 124. This is likely the results of HCTZ and Lasix used. Serum osmolality 312, which is due to her being dehydrated. Patient received 1L NS bolus.  - BMP q4h - Slowly correcting sodium. Goal sodium 128-130 by tomorrow - Continue fluid hydration    Hypotension Patient has no sign or symptoms of infection or  sepsis. Lactic acid 0.6. This  likely secondary to dehydration because her BP did improve with fluid. Cortisol level 19.6 - Pending TSH - Hold all her BP meds - Continue fluid resuscitation    Normal anion gap metabolic acidosis Bicarb 11. Anion gap 14. This is likely secondary to renal loss of bicarb.  - Continue IV fluid - BMP Q4H   Right rib fracture - Likely secondary to falls a few months ago. Conservative treatment with pain control at the moment.  - Tylenol 325 mg Q6h PRN - Oxycodone 2.5 mg TID PRN   Hypothyroidism - Pending TSH - Continue levothyroxine 100 mcg every other day    Asthma - Breo ellipta daily - Albuterol PRN    DVT: Heparin SQ 5000 units Q8h   Dispo: Admit patient to Inpatient with expected length of stay greater than 2 midnights.  Signed: Gaylan Gerold, DO 08/19/2019, 6:05 PM  Pager: [phone] After 5pm on weekdays and 1pm on weekends: On Call pager: [phone]

## 2019-08-19 NOTE — Consult Note (Signed)
Edwena Mayorga Admit Date: 08/19/2019 08/19/2019 Gean Quint Requesting Physician:  Gilles [name]  Reason for Consult:  Myra Rude HPI:  86 year old female with past medical history of CKD, hypertension, hypothyroidism, severe OA, recent C. difficile colitis back in April (with AKI at that time) who presents with several months of abdominal pain nausea/vomiting.  She was found to have significant rise in her creatinine and hyponatremia when checked by her primary care physician and therefore was sent to the ER.  She is also been losing a lot of weight and not eating.  She has not had an appetite and has been reporting a very "weird" taste in her mouth (metallic like).  She does report right-sided abdominal pain which is new.  Also reports not urinating as much as before.  Otherwise denies any fevers, chest pain, shortness of breath dysuria, hematuria. Of note, does not take any over-the-counter medications (was specifically told to stop NSAIDs years ago).  She was open to see Dr. Moshe Cipro with [facility] soon for further evaluation.  PMH Incudes: Past Medical History:  Diagnosis Date  . ALLERGIC RHINITIS   . Asthma   . Bronchitis   . DDD (degenerative disc disease), cervical   . Esophageal reflux   . Fibromyalgia   . Osteoarthritis   . Scoliosis   . Unspecified essential hypertension        Creatinine, Ser (mg/dL)  Date Value  08/19/2019 5.12 (H)  08/19/2019 5.85 (H)  05/08/2019 1.40 (H)  05/07/2019 1.54 (H)  05/06/2019 2.09 (H)  05/05/2019 2.52 (H)  05/05/2019 3.15 (H)  05/04/2019 4.63 (H)  02/25/2012 1.42 (H)  12/05/2009 1.27 (H)  ] I/Os:  ROS Balance of 12 systems is negative w/ exceptions as above  PMH  Past Medical History:  Diagnosis Date  . ALLERGIC RHINITIS   . Asthma   . Bronchitis   . DDD (degenerative disc disease), cervical   . Esophageal reflux   . Fibromyalgia   . Osteoarthritis   . Scoliosis   . Unspecified essential hypertension     PSH  Past Surgical History:  Procedure Laterality Date  . APPENDECTOMY    . BACK SURGERY  2013   lumb fusion  . COLONOSCOPY    . ERCP    . ORIF TOE FRACTURE  02/27/2012   Procedure: OPEN REDUCTION INTERNAL FIXATION (ORIF) METATARSAL (TOE) FRACTURE;  Surgeon: Colin Rhein, MD;  Location: [facility];  Service: Orthopedics;  Laterality: Right;  RIGHT OPEN REDUCTION INTERNAL FIXATION NON UNION 1ST METATARSAL NECK,FLEXOR HALLUCIS LONGUS TO PROXIMAL PHALANAX TENDON TRANSFER,  GASTROC SLIDE,  LOCAL BONE GRAFT   . SHOULDER ARTHROSCOPY  2011   rt  . TENDON TRANSFER  02/27/2012   Procedure: TENDON TRANSFER;  Surgeon: Colin Rhein, MD;  Location: [facility];  Service: Orthopedics;  Laterality: Right;  . TOTAL ABDOMINAL HYSTERECTOMY    . TOTAL ABDOMINAL HYSTERECTOMY W/ BILATERAL SALPINGOOPHORECTOMY    . TOTAL HIP ARTHROPLASTY  03,09   rt and lt  . TOTAL HIP REVISION     FH  Family History  Problem Relation Age of Onset  . Asthma Mother   . Ovarian cancer Mother   . Pancreatic cancer Father    SH  reports that she has never smoked. She has never used smokeless tobacco. She reports that she does not drink alcohol and does not use drugs. Allergies  Allergies  Allergen Reactions  . Linezolid Nausea And Vomiting  . Sulfa Antibiotics  Other reaction(s): Other Abdominal pain and bloating Abdominal pain and bloating  . Ampicillin Other (See Comments)    Ulcers in mouth,stomach upset  . Cephalexin Other (See Comments)    Mouth ulcer  . Clarithromycin Other (See Comments)    Mouth ulcer  . Codeine Other (See Comments)    unknown  . Doxycycline Other (See Comments)    Mouth ulcer  . Erythromycin Other (See Comments)    Mouth ulcer  . Gatifloxacin Other (See Comments)    Mouth ulcer  . Hydrocodone Other (See Comments)    unknown  . Penicillins Other (See Comments)    Mouth ulcer  . Latex Rash  . Tape Rash   Home medications Prior to Admission  medications   Medication Sig Start Date End Date Taking? Authorizing Provider  albuterol (PROAIR HFA) 108 (90 BASE) MCG/ACT inhaler 2 puffs every 4 hours if needed- rescue inhaler Patient taking differently: Inhale 2 puffs into the lungs every 4 (four) hours as needed for wheezing or shortness of breath.  11/01/11 08/19/19 Yes Young, Tarri Fuller D, MD  budesonide-formoterol (SYMBICORT) 80-4.5 MCG/ACT inhaler Inhale 2 puffs into the lungs 2 (two) times daily. Rinse mouth 11/01/11 08/19/19 Yes Young, Tarri Fuller D, MD  Cholecalciferol (VITAMIN D3) 1000 UNITS CAPS Take 1 capsule by mouth daily.     Yes [provider]  conjugated estrogens (PREMARIN) vaginal cream USE AS DIRECTED Patient taking differently: Place 1 Applicatorful vaginally See admin instructions. Use as directed 05/05/15  Yes Dena Billet B, PA-C  cromolyn (NASALCROM) 5.2 MG/ACT nasal spray Place 1 spray into the nose 4 (four) times daily as needed for allergies or rhinitis.    Yes [provider]  diazepam (VALIUM) 10 MG tablet Take 1 tablet by mouth as needed for anxiety or sleep.  09/04/12  Yes [provider]  estradiol (VIVELLE-DOT) 0.05 MG/24HR patch APPLY 1 PATCH EXTERNALLY TO THE SKIN EVERY 72 HOURS Patient taking differently: Place 1 patch onto the skin every 3 (three) days.  04/10/16  Yes Dena Billet B, PA-C  fish oil-omega-3 fatty acids 1000 MG capsule Take 1 g by mouth daily.     Yes [provider]  Flaxseed, Linseed, (FLAX SEED OIL) 1000 MG CAPS Take by mouth.     Yes [provider]  fluocinonide cream (LIDEX) 1.61 % Apply 1 application topically 2 (two) times daily.  04/23/19  Yes [provider]  fluticasone (FLONASE) 50 MCG/ACT nasal spray USE 1 SPRAY IN EACH NOSTRIL EVERY DAY AS DIRECTED 01/29/13  Yes [provider]  furosemide (LASIX) 40 MG tablet Take 20 mg by mouth daily as needed for fluid or edema.    Yes [provider]  hyoscyamine (LEVSIN, ANASPAZ) 0.125 MG  tablet Take 0.125 mg by mouth every 6 (six) hours as needed for bladder spasms or cramping.  08/12/17  Yes [provider]  levothyroxine (SYNTHROID, LEVOTHROID) 100 MCG tablet Take 100 mcg by mouth every other day.    Yes [provider]  LINZESS 290 MCG CAPS capsule Take 1 capsule (290 mcg total) by mouth daily as needed (constipation). 05/08/19  Yes Rai, Ripudeep K, MD  loratadine (CLARITIN) 10 MG tablet Take 10 mg by mouth daily as needed for allergies.    Yes [provider]  MOVANTIK 25 MG TABS tablet Take 1 tablet (25 mg total) by mouth daily as needed (constipation). 05/08/19  Yes Rai, Ripudeep K, MD  nystatin (MYCOSTATIN) powder Apply topically 2 (two) times daily. To effected  areas Patient taking differently: Apply topically 2 (two) times daily as needed (skin irritation). To effected areas 12/01/14  Yes Dena Billet B, PA-C  omeprazole (PRILOSEC) 40 MG capsule Take 40 mg by mouth daily.  01/21/14  Yes [provider]  oxyCODONE-acetaminophen (PERCOCET) 10-325 MG tablet Take 0.5 tablets by mouth 3 (three) times daily as needed for pain.  10/25/17  Yes [provider]  SYNTHROID 112 MCG tablet Take 112 mcg by mouth every other day. 07/17/19  Yes [provider]  methocarbamol (ROBAXIN) 500 MG tablet Take 1 tablet (500 mg total) by mouth every 8 (eight) hours as needed for muscle spasms. Patient not taking: Reported on 08/19/2019 05/08/19   Rai, Vernelle Emerald, MD  olmesartan-hydrochlorothiazide (BENICAR HCT) 20-12.5 MG tablet Take 1 tablet by mouth daily. 07/29/19   [provider]  potassium chloride (KLOR-CON) 10 MEQ tablet Take 10 mEq by mouth daily. 07/02/19   [provider]  Spacer/Aero-Holding Chambers (AEROCHAMBER MV) inhaler by Other route. Use as instructed     [provider]    Current Medications Scheduled Meds: . fluticasone furoate-vilanterol  1 puff Inhalation Daily  . heparin  5,000 Units Subcutaneous Q8H  .  levothyroxine  100 mcg Oral QODAY   Continuous Infusions: . lactated ringers     PRN Meds:.acetaminophen, albuterol, LORazepam **OR** LORazepam, oxyCODONE, polyethylene glycol  CBC Recent Labs  Lab 08/19/19 0734  WBC 6.2  HGB 9.8*  HCT 30.0*  MCV 86.0  PLT 950   Basic Metabolic Panel Recent Labs  Lab 08/19/19 0734 08/19/19 1720  NA 124* 127*  K 4.6 4.4  CL 99 104  CO2 11* 10*  GLUCOSE 102* 95  BUN 144* 136*  CREATININE 5.85* 5.12*  CALCIUM 9.0 8.8*    Physical Exam  Blood pressure (!) 82/51, pulse 73, temperature 97.9 F (36.6 C), temperature source Oral, resp. rate 12, SpO2 (!) 79 %. GEN: laying flat in bed, nad ENT: no nasal discharge, mmm, no oral ulcers, no nasal crusting superficially (utilized an otoscope) EYES: no scleral icterus, eomi CV: S1-S2, RRR PULM: no iwob, bilateral chest rise, clear to auscultation bilaterally with no overt wheezes/rhonchi/rales/crackles ABD: Soft, nontender/nondistended SKIN: Poor skin turgor, erythema/rashes on bilateral lower extremities as well as palmar aspect of hand EXT: no edema, OA deformities especially on bilateral hands Neuro: Hard of hearing, speech clear and coherent, minimal asterixis   Assessment 1. AKI on CKD4 with significant azotemia and signs of uremic encephalopathy.  Seems like her baseline creatinine has been typically around 1.4-1.6.  Likely etiology of her AKI is likely secondary to prerenal insults.  She does have atrophic kidneys bilaterally on ultrasound today with cortical thinning and cortical echogenicity. 2.  Hyponatremia, hypovolemic.  Likely related to thiazide use as well as low solute intake (urine sodium less than 10).  Sodium has already improved after the administration of 1.4 L of isotonic fluids which is reassuring. 3.  Nongap metabolic acidosis likely related to #1 4.  Hypotension (history of hypertension), improved with fluids 5.  Hypothyroidism 6.  Normocytic anemia  Plan 1. Would hold  off on initiation of renal replacement therapy as of right now and will give her a chance with IV hydration.  If her symptoms of nausea/vomiting, dysgeusia, asterixis, diffuse pruritus does not improve and her BUN persistently remains elevated thing can trial hemodialysis for her which the daughter and the patient herself agree with. 2. Difficult to ascertain what her AKI initially but suspecting a prerenal cause (especially  as she had been on lasix prn).  She has a relatively bland urine sediment on urinalysis today. 3. Continue to monitor serial sodium levels.  Check thyroid panel 4. Continue with isotonic fluids.  Fluid of choice I recommend would be lactated Ringer's. 5. Watch bicarb closely if lower then would advise checking a blood gas to ensure that she has a stable pH (and to make sure she will not require bicarbonate infusion). 6. Avoid nephrotoxins including NSAIDs. Maintain strict Input and Output monitoring including daily standing weights if feasible. Will continue to monitor clinically with labs and daily exams and intervene as indicated.  Plans communicated to the primary team   Gean Quint, MD [facility] 08/19/2019, 6:29 PM

## 2019-08-19 NOTE — ED Triage Notes (Addendum)
Pt presents to ED POV. Pt c/o fatigue for several months. Pt states, "Ijust feel awful." pt told to come here by PCP, critical results for Na and renal function. NAD

## 2019-08-19 NOTE — ED Notes (Signed)
Pt placed in trendelenburg position at this time, pt hypotensive at rest, pt remains asymptomatic, pulse strong equal bilaterally, respirations even and unlabored.

## 2019-08-19 NOTE — ED Notes (Signed)
Bladder scan 0 mls  

## 2019-08-19 NOTE — ED Notes (Signed)
Pt assisted x2 assist to bedside commode. Pt made aware of use of call bell once finished, pt verbalized understanding. Bedside commode placed in lowest position.

## 2019-08-19 NOTE — ED Notes (Signed)
Pt assisted back to bed by this RN, pt repositioned in bed in most comfortable position, call bell in reach. Pt denies needs or wants at this time.

## 2019-08-19 NOTE — ED Provider Notes (Signed)
Medical screening examination/treatment/procedure(s) were conducted as a shared visit with non-physician practitioner(s) and myself.  I personally evaluated the patient during the encounter.      She was referred to the emergency department by her PCP office.  She had critical elevations in her BUN and creatinine and hyponatremia.  She has had several months of fatigue.  She has developed nausea and vomiting over the past 48 hours.  Patient has generalized weakness and malaise.  Patient had C. difficile in April.  She has had difficulty returning to baseline health since that time.  Patient is alert with clear mental status.  Nontoxic.  No respiratory distress.  Heart regular.  Abdomen soft with mild distention.  Positive bilateral peripheral edema.  Patient presents with acute renal failure.  Fluid resuscitation initiated.  Patient's blood pressures are soft.  Patient does not have lactic acidosis, no positive factors for sepsis at this time.  Plan for admission for ongoing treatment and additional diagnostic evaluation for acute renal failure with likely dehydration as etiology in part.    Charlesetta Shanks, MD 08/26/19 [phone]

## 2019-08-20 ENCOUNTER — Inpatient Hospital Stay (HOSPITAL_COMMUNITY): Payer: Medicare Other

## 2019-08-20 DIAGNOSIS — N19 Unspecified kidney failure: Secondary | ICD-10-CM

## 2019-08-20 HISTORY — PX: IR FLUORO GUIDE CV LINE RIGHT: IMG2283

## 2019-08-20 HISTORY — PX: IR US GUIDE VASC ACCESS RIGHT: IMG2390

## 2019-08-20 LAB — BASIC METABOLIC PANEL
Anion gap: 13 (ref 5–15)
Anion gap: 14 (ref 5–15)
Anion gap: 16 — ABNORMAL HIGH (ref 5–15)
Anion gap: 13 (ref 5–15)
Anion gap: 13 (ref 5–15)
BUN: 130 mg/dL — ABNORMAL HIGH (ref 8–23)
BUN: 124 mg/dL — ABNORMAL HIGH (ref 8–23)
BUN: 129 mg/dL — ABNORMAL HIGH (ref 8–23)
BUN: 121 mg/dL — ABNORMAL HIGH (ref 8–23)
BUN: 118 mg/dL — ABNORMAL HIGH (ref 8–23)
BUN: 56 mg/dL — ABNORMAL HIGH (ref 8–23)
CO2: 9 mmol/L — ABNORMAL LOW (ref 22–32)
CO2: 8 mmol/L — ABNORMAL LOW (ref 22–32)
CO2: <7 mmol/L — ABNORMAL LOW (ref 22–32)
CO2: 11 mmol/L — ABNORMAL LOW (ref 22–32)
CO2: 15 mmol/L — ABNORMAL LOW (ref 22–32)
CO2: 21 mmol/L — ABNORMAL LOW (ref 22–32)
Calcium: 8.7 mg/dL — ABNORMAL LOW (ref 8.9–10.3)
Calcium: 8.6 mg/dL — ABNORMAL LOW (ref 8.9–10.3)
Calcium: 8.7 mg/dL — ABNORMAL LOW (ref 8.9–10.3)
Calcium: 8.8 mg/dL — ABNORMAL LOW (ref 8.9–10.3)
Calcium: 8.9 mg/dL (ref 8.9–10.3)
Calcium: 8.6 mg/dL — ABNORMAL LOW (ref 8.9–10.3)
Chloride: 109 mmol/L (ref 98–111)
Chloride: 108 mmol/L (ref 98–111)
Chloride: 108 mmol/L (ref 98–111)
Chloride: 106 mmol/L (ref 98–111)
Chloride: 104 mmol/L (ref 98–111)
Chloride: 100 mmol/L (ref 98–111)
Creatinine, Ser: 4.36 mg/dL — ABNORMAL HIGH (ref 0.44–1.00)
Creatinine, Ser: 4.15 mg/dL — ABNORMAL HIGH (ref 0.44–1.00)
Creatinine, Ser: 4.14 mg/dL — ABNORMAL HIGH (ref 0.44–1.00)
Creatinine, Ser: 3.86 mg/dL — ABNORMAL HIGH (ref 0.44–1.00)
Creatinine, Ser: 3.67 mg/dL — ABNORMAL HIGH (ref 0.44–1.00)
Creatinine, Ser: 1.97 mg/dL — ABNORMAL HIGH (ref 0.44–1.00)
GFR calc Af Amer: 10 mL/min — ABNORMAL LOW (ref >60)
GFR calc Af Amer: 11 mL/min — ABNORMAL LOW (ref >60)
GFR calc Af Amer: 11 mL/min — ABNORMAL LOW (ref >60)
GFR calc Af Amer: 12 mL/min — ABNORMAL LOW (ref >60)
GFR calc Af Amer: 12 mL/min — ABNORMAL LOW (ref >60)
GFR calc Af Amer: 26 mL/min — ABNORMAL LOW (ref >60)
GFR calc non Af Amer: 9 mL/min — ABNORMAL LOW (ref >60)
GFR calc non Af Amer: 9 mL/min — ABNORMAL LOW (ref >60)
GFR calc non Af Amer: 9 mL/min — ABNORMAL LOW (ref >60)
GFR calc non Af Amer: 10 mL/min — ABNORMAL LOW (ref >60)
GFR calc non Af Amer: 11 mL/min — ABNORMAL LOW (ref >60)
GFR calc non Af Amer: 23 mL/min — ABNORMAL LOW (ref >60)
Glucose, Bld: 79 mg/dL (ref 70–99)
Glucose, Bld: 72 mg/dL (ref 70–99)
Glucose, Bld: 58 mg/dL — ABNORMAL LOW (ref 70–99)
Glucose, Bld: 118 mg/dL — ABNORMAL HIGH (ref 70–99)
Glucose, Bld: 165 mg/dL — ABNORMAL HIGH (ref 70–99)
Glucose, Bld: 104 mg/dL — ABNORMAL HIGH (ref 70–99)
Potassium: 4.5 mmol/L (ref 3.5–5.1)
Potassium: 4.2 mmol/L (ref 3.5–5.1)
Potassium: 4.7 mmol/L (ref 3.5–5.1)
Potassium: 4.2 mmol/L (ref 3.5–5.1)
Potassium: 4.2 mmol/L (ref 3.5–5.1)
Potassium: 3.3 mmol/L — ABNORMAL LOW (ref 3.5–5.1)
Sodium: 131 mmol/L — ABNORMAL LOW (ref 135–145)
Sodium: 130 mmol/L — ABNORMAL LOW (ref 135–145)
Sodium: 132 mmol/L — ABNORMAL LOW (ref 135–145)
Sodium: 133 mmol/L — ABNORMAL LOW (ref 135–145)
Sodium: 132 mmol/L — ABNORMAL LOW (ref 135–145)
Sodium: 134 mmol/L — ABNORMAL LOW (ref 135–145)

## 2019-08-20 LAB — CBG MONITORING, ED
Glucose-Capillary: 60 mg/dL — ABNORMAL LOW (ref 70–99)
Glucose-Capillary: 122 mg/dL — ABNORMAL HIGH (ref 70–99)

## 2019-08-20 LAB — RENAL FUNCTION PANEL
Albumin: 3 g/dL — ABNORMAL LOW (ref 3.5–5.0)
Anion gap: 12 (ref 5–15)
BUN: 111 mg/dL — ABNORMAL HIGH (ref 8–23)
CO2: 15 mmol/L — ABNORMAL LOW (ref 22–32)
Calcium: 8.5 mg/dL — ABNORMAL LOW (ref 8.9–10.3)
Chloride: 100 mmol/L (ref 98–111)
Creatinine, Ser: 3.34 mg/dL — ABNORMAL HIGH (ref 0.44–1.00)
GFR calc Af Amer: 14 mL/min — ABNORMAL LOW (ref >60)
GFR calc non Af Amer: 12 mL/min — ABNORMAL LOW (ref >60)
Glucose, Bld: 181 mg/dL — ABNORMAL HIGH (ref 70–99)
Phosphorus: 4.9 mg/dL — ABNORMAL HIGH (ref 2.5–4.6)
Potassium: 3.7 mmol/L (ref 3.5–5.1)
Sodium: 127 mmol/L — ABNORMAL LOW (ref 135–145)

## 2019-08-20 LAB — I-STAT VENOUS BLOOD GAS, ED
Acid-base deficit: 16 mmol/L — ABNORMAL HIGH (ref 0.0–2.0)
Bicarbonate: 10.4 mmol/L — ABNORMAL LOW (ref 20.0–28.0)
Calcium, Ion: 1.18 mmol/L (ref 1.15–1.40)
HCT: 29 % — ABNORMAL LOW (ref 36.0–46.0)
Hemoglobin: 9.9 g/dL — ABNORMAL LOW (ref 12.0–15.0)
O2 Saturation: 99 %
Potassium: 4.4 mmol/L (ref 3.5–5.1)
Sodium: 133 mmol/L — ABNORMAL LOW (ref 135–145)
TCO2: 11 mmol/L — ABNORMAL LOW (ref 22–32)
pCO2, Ven: 26.3 mmHg — ABNORMAL LOW (ref 44.0–60.0)
pH, Ven: 7.205 — ABNORMAL LOW (ref 7.250–7.430)
pO2, Ven: 160 mmHg — ABNORMAL HIGH (ref 32.0–45.0)

## 2019-08-20 LAB — PROTEIN / CREATININE RATIO, URINE
Creatinine, Urine: 78.16 mg/dL
Protein Creatinine Ratio: 0.41 mg/mg{Cre} — ABNORMAL HIGH (ref 0.00–0.15)
Total Protein, Urine: 32 mg/dL

## 2019-08-20 LAB — GLUCOSE, CAPILLARY: Glucose-Capillary: 116 mg/dL — ABNORMAL HIGH (ref 70–99)

## 2019-08-20 LAB — UREA NITROGEN, URINE: Urea Nitrogen, Ur: 433 mg/dL

## 2019-08-20 MED ORDER — SODIUM CHLORIDE 0.9 % IV SOLN: 100.0000 mL | INTRAVENOUS | Status: DC | PRN

## 2019-08-20 MED ORDER — SODIUM BICARBONATE 8.4 % IV SOLN
50.0000 meq | Freq: Once | INTRAVENOUS | Status: AC
  Administered 2019-08-20: 50 meq via INTRAVENOUS
  Filled 2019-08-20: qty 50

## 2019-08-20 MED ORDER — HEPARIN SODIUM (PORCINE) 1000 UNIT/ML DIALYSIS: 1000.0000 [IU] | INTRAMUSCULAR | Status: DC | PRN

## 2019-08-20 MED ORDER — PENTAFLUOROPROP-TETRAFLUOROETH EX AERO: 1.0000 "application " | INHALATION_SPRAY | CUTANEOUS | Status: DC | PRN

## 2019-08-20 MED ORDER — HEPARIN SODIUM (PORCINE) 1000 UNIT/ML IJ SOLN
INTRAMUSCULAR | Status: AC
  Administered 2019-08-20: 1000 [IU]
  Filled 2019-08-20: qty 4

## 2019-08-20 MED ORDER — ALTEPLASE 2 MG IJ SOLR: 2.0000 mg | Freq: Once | INTRAMUSCULAR | Status: DC | PRN

## 2019-08-20 MED ORDER — SODIUM BICARBONATE 650 MG PO TABS
1300.0000 mg | ORAL_TABLET | Freq: Three times a day (TID) | ORAL | Status: DC
  Administered 2019-08-20 – 2019-08-24 (×13): 1300 mg via ORAL
  Filled 2019-08-20 (×16): qty 2

## 2019-08-20 MED ORDER — DEXTROSE 5 % IV SOLN: INTRAVENOUS | Status: DC

## 2019-08-20 MED ORDER — LIDOCAINE HCL 1 % IJ SOLN
INTRAMUSCULAR | Status: AC | PRN
  Administered 2019-08-20: 10 mL

## 2019-08-20 MED ORDER — MAGNESIUM SULFATE 2 GM/50ML IV SOLN
2.0000 g | Freq: Once | INTRAVENOUS | Status: AC
  Administered 2019-08-20: 2 g via INTRAVENOUS
  Filled 2019-08-20: qty 50

## 2019-08-20 MED ORDER — DEXTROSE 5 % IV BOLUS
250.0000 mL | Freq: Once | INTRAVENOUS | Status: AC
  Administered 2019-08-20: 250 mL via INTRAVENOUS

## 2019-08-20 MED ORDER — LIDOCAINE HCL 1 % IJ SOLN
INTRAMUSCULAR | Status: AC
  Filled 2019-08-20: qty 20

## 2019-08-20 MED ORDER — HEPARIN SODIUM (PORCINE) 1000 UNIT/ML IJ SOLN
INTRAMUSCULAR | Status: AC
  Filled 2019-08-20: qty 1

## 2019-08-20 MED ORDER — LIDOCAINE-PRILOCAINE 2.5-2.5 % EX CREA: 1.0000 "application " | TOPICAL_CREAM | CUTANEOUS | Status: DC | PRN

## 2019-08-20 MED ORDER — DEXTROSE-NACL 5-0.45 % IV SOLN: INTRAVENOUS | Status: DC

## 2019-08-20 MED ORDER — LIDOCAINE HCL (PF) 1 % IJ SOLN: 5.0000 mL | INTRAMUSCULAR | Status: DC | PRN

## 2019-08-20 MED ORDER — CHLORHEXIDINE GLUCONATE CLOTH 2 % EX PADS
6.0000 | MEDICATED_PAD | Freq: Every day | CUTANEOUS | Status: DC
  Administered 2019-08-21 – 2019-08-24 (×4): 6 via TOPICAL

## 2019-08-20 NOTE — Progress Notes (Signed)
Subjective:   Hospital day: 1  Overnight event: No  Patient is seen at bedside. She is somnolent but arouse with verbal stimulation. She does not appear in acute distress. Patient states that she is not feeling well but could not explain in detail. She complains of generalized fatigue which has not improved. She also complain of generalized pruritus which has improved somewhat. She also endorse lightheadedness. Her mentation is normal .    Objective:  Vital signs in last 24 hours: Vitals:   08/20/19 0930 08/20/19 1040 08/20/19 1110 08/20/19 1300  BP: (!) 93/37 (!) 98/62 (!) 123/55 (!) 121/57  Pulse: 63 60 79 76  Resp: 10 (!) 10 18 19   Temp:   98.3 F (36.8 C)   TempSrc:   Oral   SpO2: 98% 98% 98% 96%    Physical Exam  Physical Exam Constitutional:      General: She is not in acute distress.    Comments: Hard to hear  HENT:     Head: Normocephalic.  Eyes:     General: No scleral icterus.    Conjunctiva/sclera: Conjunctivae normal.  Cardiovascular:     Rate and Rhythm: Normal rate and regular rhythm.     Heart sounds: Murmur (3/6 systolic murmur heard best right upper sternal border) heard.   Pulmonary:     Effort: Pulmonary effort is normal. No respiratory distress.  Abdominal:     General: Bowel sounds are normal.     Tenderness: There is no abdominal tenderness.  Musculoskeletal:        General: Tenderness present.     Right lower leg: No edema.     Left lower leg: No edema.     Comments: Very tender to touch  Skin:    General: Skin is warm.     Coloration: Skin is not jaundiced.  Psychiatric:        Mood and Affect: Mood normal.        Behavior: Behavior normal.    CBC Latest Ref Rng & Units 08/20/2019 08/19/2019 05/08/2019  WBC 4.0 - 10.5 K/uL - 6.2 8.6  Hemoglobin 12.0 - 15.0 g/dL 9.9(L) 9.8(L) 8.2(L)  Hematocrit 36 - 46 % 29.0(L) 30.0(L) 25.6(L)  Platelets 150 - 400 K/uL - 308 268   BMP Latest Ref Rng & Units 08/20/2019 08/20/2019 08/20/2019  Glucose 70  - 99 mg/dL 118(H) 58(L) -  BUN 8 - 23 mg/dL 121(H) 129(H) -  Creatinine 0.44 - 1.00 mg/dL 3.86(H) 4.14(H) -  Sodium 135 - 145 mmol/L 133(L) 132(L) 133(L)  Potassium 3.5 - 5.1 mmol/L 4.2 4.7 4.4  Chloride 98 - 111 mmol/L 106 108 -  CO2 22 - 32 mmol/L 11(L) <7(L) -  Calcium 8.9 - 10.3 mg/dL 8.8(L) 8.7(L) -    Assessment/Plan: [name] is an 86 year old woman with PMHx of HTN, hypothyroidism, fibromyalgia, OA, GERD, DDD who presented to the ED with fatigue, found to have AKI on CKD and hyperosmolar hyponatremia.   Principal Problem:   Acute on chronic renal failure w/ Uremia (HCC) Active Problems:   Essential hypertension   Anxiety   Hyperosmolar Hyponatremia   Normal anion gap metabolic acidosis   Right rib fracture  Acute kidney injury on CKD Patient has history of CKD stage 4 and baseline creatine is 1.4. Creatine on admission is 5.8. Renal US is negative for obstruction or stone. This is likely Uremia secondary to dehydration 2/2 poor PO intake on top of HCTZ and Lasix used. FeNa calculated 0.5 %  which is consistent with pre-renal.  Nephrology is onboard with the treatment plan. Creatine and Bun slowly improving, however her clinical picture is not getting better. Bicarb is also low with non anion gap metabolic acidosis. Patient will have a temporary cath placed and a short session of HD today. Discussed with patient about long term goal whether to pursue long term dialysis vs symptom management. Will re-discuss with patient and her daughter tomorrow. - Hold her Lasix and HCTZ - Dialysis today - BMP q4h  - Strict I/O - Daily weight   Hypertonic Hyponatremia Sodium on admission 124. This is likely the results of HCTZ and Lasix used. Serum osmolality 312, which is due to her being dehydrated.  Currently on D5W to prevent overcorrection of sodium.  - Current sodium 133.  - BMP q4h - Continue fluid hydration with D5W 150 cc/h - Patient will have a short session of HD today. -  Avoid overcorrection of sodium.    Normal anion gap metabolic acidosis VBG shows pH of 7.2 with bicarb of 10 and pCO2 of 26. This is normal anion gap primary metabolic acidosis with inadequate respiratory compensation. Likely secondary to renal loss of bicarb. Sodium bicarb given PO with 1 amp of bicarb IV.  - Continue IV fluid - BMP Q4H - Dialysis today    Hypotension Patient has no sign or symptoms of infection or sepsis. Lactic acid 0.6. This likely secondary to dehydration because her BP did improve with fluid. Cortisol level 19.6. TSH 0.4. Current BP 128/54. - Hold all her BP meds - Continue fluid resuscitation    Right rib fracture - Likely secondary to falls a few months ago. Conservative treatment with pain control at the moment.  - Tylenol 325 mg Q6h PRN - Oxycodone 2.5 mg TID PRN   Hypothyroidism - TSH 0.42 - Continue levothyroxine 100 mcg every other day    Asthma - Breo ellipta daily - Albuterol PRN    Diet: NPO IVF: D5W 150 cc/h VTE: Heparin SQ CODE: Full  Prior to Admission Living Arrangement: home Anticipated Discharge Location: home vs SNF Barriers to Discharge: Uremia Dispo: Anticipated discharge in approximately 2-3 day(s).   Gaylan Gerold, DO 08/20/2019, 1:38 PM Pager: [phone] After 5pm on weekdays and 1pm on weekends: On Call pager [phone]

## 2019-08-20 NOTE — Plan of Care (Signed)
  Problem: Education: Goal: Knowledge of disease and its progression will improve Outcome: Progressing   Problem: Health Behavior/Discharge Planning: Goal: Ability to manage health-related needs will improve Outcome: Progressing   Problem: Clinical Measurements: Goal: Complications related to the disease process or treatment will be avoided or minimized Outcome: Progressing   Problem: Clinical Measurements: Goal: Dialysis access will remain free of complications Outcome: Progressing   Problem: Nutritional: Goal: Ability to make appropriate dietary choices will improve Outcome: Progressing

## 2019-08-20 NOTE — Progress Notes (Signed)
[facility] Progress Note    Assessment/ Plan:   1. Acute kidney Injury on ckd4 with significant azotemia and uremic symptoms -Although her creatinine has slightly improved, her symptoms are worsening.  I distichiasis with patient and with daughter at the bedside about the initiation of temporary dialysis to help with her symptoms. -IR consult placed for temporary catheter placement.  We will initiate hemodialysis (slow start protocol to prevent dialysis disequilibrium syndrome) once her access has been placed -Continue fluids -Avoid nephrotoxic medications including NSAIDs and iodinated intravenous contrast exposure unless the latter is absolutely indicated.  Preferred narcotic agents for pain control are hydromorphone, fentanyl, and methadone. Morphine should not be used. Avoid Baclofen and avoid oral sodium phosphate and magnesium citrate based laxatives / bowel preps. Continue strict Input and Output monitoring. Will monitor the patient closely with you and intervene or adjust therapy as indicated by changes in clinical status/labs  2.  Metabolic acidosis/acidemia likely secondary to -Agree with bicarb infusion 3.  Hyponatremia, improved.  Secondary to thiazide diuretics and low solute intake (urine sodium less than 10 on admit) 4.  Hypertension, improving with fluids 5.  Normocytic anemia, likely related to chronic kidney disease -Check iron panel  Subjective:   Does not feel any better.  Still having dysgeusia, nausea/vomiting, no appetite.  Denies any shortness of breath.  Requesting inhalers for her asthma.   Objective:   BP (!) 128/54 (BP Location: Left Arm)   Pulse 83   Temp 98.6 F (37 C) (Oral)   Resp 16   SpO2 98%   Intake/Output Summary (Last 24 hours) at 08/20/2019 1441 Last data filed at 08/20/2019 1344 Gross per 24 hour  Intake 1050 ml  Output 950 ml  Net 100 ml   Weight change:   Physical Exam: Gen: Uncomfortable appearing CVS: Regular rate,  S1-S2 Resp: Clear auscultation bilaterally Abd: Soft, nontender nondistended Ext: Trace edema bilateral lower extremities Neuro: Hard of hearing, speech clear and coherent, minimal asterixis  Imaging: CT ABDOMEN PELVIS WO CONTRAST  Result Date: 08/19/2019 CLINICAL DATA:  Right lower quadrant abdominal pain. EXAM: CT ABDOMEN AND PELVIS WITHOUT CONTRAST TECHNIQUE: Multidetector CT imaging of the abdomen and pelvis was performed following the standard protocol without IV contrast. COMPARISON:  CT of the abdomen pelvis 05/04/2019 FINDINGS: Lower chest: Linear atelectasis and scarring is again noted at both lung bases. No nodule or mass lesion is present. No significant consolidation is present. Atherosclerotic changes are noted at the aortic valve. Heart size is normal. No significant pleural or pericardial effusion is present. Hepatobiliary: Liver is within normal limits. Calcifications in the porta hepatis are stable. Gallbladder is unremarkable. Pancreas: Atrophic no focal lesions. Spleen: Within normal limits. Adrenals/Urinary Tract: The adrenal glands are normal bilaterally. Kidneys and ureters are normal. The urinary bladder is within normal limits. Stomach/Bowel: The stomach and duodenum are within normal limits. Small bowel is unremarkable. Terminal ileum is within normal limits. Calcification in the small bowel mesentery of the left upper quadrant is stable. The appendix is not discretely visualized and may be surgically absent. The ascending transverse colon are within normal limits. Descending and sigmoid colon are normal. Vascular/Lymphatic: Atherosclerotic calcifications are present in the aorta and branch vessels without aneurysm. No significant retroperitoneal or pelvic adenopathy is present. No significant para-aortic nodes are present. Reproductive: Uterus and bilateral adnexa are unremarkable. Other: Paraumbilical hernia contains fat without bowel. No other significant ventral hernias are  present. No significant free fluid or free air is present. Musculoskeletal: Levoconvex  scoliosis is again noted. Posterior lumbar fusion is noted at L4-5 vertebral body heights are normal. Grade 1 anterolisthesis present at L3-4, L4-5, and L5-S1. Bilateral total hip arthroplasties are present. IMPRESSION: 1. No acute or focal lesion to explain the patient's symptoms. 2. Paraumbilical hernia contains fat without bowel. 3. Aortic Atherosclerosis (ICD10-I70.0). Electronically Signed   By: San Morelle M.D.   On: 08/19/2019 13:14   DG Ribs Unilateral Right  Result Date: 08/19/2019 CLINICAL DATA:  Fall in April. Questionable healing rib fractures on portable chest x-ray. Kyphosis. EXAM: RIGHT RIBS - 2 VIEW COMPARISON:  08/19/2019 FINDINGS: Remote RIGHT shoulder arthroplasty. There is callus surrounding subacute fractures of the RIGHT LATERAL ribs 7-9. Fracture of the RIGHT 6th rib is acute or subacute. No pneumothorax. Thoracolumbar scoliosis. IMPRESSION: 1. Subacute fractures of RIGHT ribs 7-9. 2. Acute or subacute fracture of the RIGHT 6th rib. Electronically Signed   By: Nolon Nations M.D.   On: 08/19/2019 12:29   US Renal  Result Date: 08/19/2019 CLINICAL DATA:  Acute kidney injury EXAM: RENAL / URINARY TRACT ULTRASOUND COMPLETE COMPARISON:  CT abdomen pelvis 08/19/2019 FINDINGS: Right Kidney: Renal measurements: 8.9 x 3 x 4 cm = volume: 50.9 mL. Cortical thinning and increased renal cortical echogenicity. No concerning mass, shadowing calculus or hydronephrosis. Left Kidney: Renal measurements: 8.5 x 3.2 x 3.4 cm = volume: 47.9 mL. More normal cortical echogenicity which is within normal limits. No mass, shadowing calculus or hydronephrosis. Bladder: Appears normal for degree of bladder distention. Other: None. IMPRESSION: Diminutive, likely atrophic kidneys bilaterally More pronounced cortical thinning and increased renal cortical echogenicity of the right kidney compatible with medical renal  disease. Otherwise unremarkable renal ultrasound. Electronically Signed   By: Lovena Le M.D.   On: 08/19/2019 17:47   DG Chest Portable 1 View  Result Date: 08/19/2019 CLINICAL DATA:  Weakness and fatigue for several months EXAM: PORTABLE CHEST 1 VIEW COMPARISON:  Portable exam 1019 hours compared to 05/04/2019 FINDINGS: Normal heart size, mediastinal contours, and pulmonary vascularity. Atherosclerotic calcification aorta. Bronchitic changes without pulmonary infiltrate, pleural effusion or pneumothorax. Osseous demineralization with advanced thoracolumbar scoliosis. Nondisplaced fracture of the lateral RIGHT sixth rib. Nodular density identified in the mid to lower RIGHT lung, potentially representing callus at a healing fracture of the posterior RIGHT seventh rib though pulmonary nodule not entirely excluded; recommend dedicated rib radiographs to evaluate. RIGHT shoulder prosthesis. IMPRESSION: Bronchitic changes without infiltrate. Nondisplaced fracture of lateral RIGHT sixth rib with question healing fracture of the posterior RIGHT seventh rib accounting for a nodular density in the LEFT lung; recommend dedicated RIGHT rib radiographs to confirm callus at a healing seventh rib fracture and exclude pulmonary nodule. Electronically Signed   By: Lavonia Dana M.D.   On: 08/19/2019 11:05    Labs: BMET Recent Labs  Lab 08/19/19 0734 08/19/19 1720 08/20/19 0344 08/20/19 0505 08/20/19 0821 08/20/19 0947 08/20/19 1238  NA 124* 127* 131* 130* 133* 132* 133*  K 4.6 4.4 4.5 4.2 4.4 4.7 4.2  CL 99 104 109 108  --  108 106  CO2 11* 10* 9* 8*  --  <7* 11*  GLUCOSE 102* 95 79 72  --  58* 118*  BUN 144* 136* 130* 124*  --  129* 121*  CREATININE 5.85* 5.12* 4.36* 4.15*  --  4.14* 3.86*  CALCIUM 9.0 8.8* 8.7* 8.6*  --  8.7* 8.8*   CBC Recent Labs  Lab 08/19/19 0734 08/20/19 0821  WBC 6.2  --   HGB 9.8*  9.9*  HCT 30.0* 29.0*  MCV 86.0  --   PLT 308  --     Medications:    . [START ON  08/21/2019] Chlorhexidine Gluconate Cloth  6 each Topical Q0600  . fluticasone furoate-vilanterol  1 puff Inhalation Daily  . heparin  5,000 Units Subcutaneous Q8H  . levothyroxine  100 mcg Oral QODAY  . sodium bicarbonate  1,300 mg Oral TID      Gean Quint, MD [facility] 08/20/2019, 2:41 PM

## 2019-08-20 NOTE — ED Notes (Signed)
Pt appears to be resting comfortably with eyes closed, pt hypotension but asymptomatic. Respirations even and unlabored, pulse steady and strong. Pt denies any wants, needs or concerns at this time.

## 2019-08-20 NOTE — ED Notes (Signed)
Pt requesting PO fluids, pt provided ice chips, pt made aware of NPO status with verbalized understanding.

## 2019-08-20 NOTE — ED Notes (Signed)
Called report, nurse unavailable at this time.

## 2019-08-20 NOTE — ED Notes (Signed)
Pt provided ice chips upon request, pt repositioned in bed, call bell in reach. Pt denies any needs, wants or concerns at this time.

## 2019-08-20 NOTE — Procedures (Signed)
Interventional Radiology Procedure Note  Procedure: Placement of a right IJ approach non-tunneled HD catheter.  Tip is positioned at the superior cavoatrial junction and catheter is ready for immediate use.  Complications: None Recommendations:  - Ok to use - can convert if needed - Do not submerge - Routine line care   Signed,  Dulcy Fanny. Earleen Newport, DO

## 2019-08-20 NOTE — ED Notes (Signed)
Report given to 50M

## 2019-08-20 NOTE — ED Notes (Signed)
Pt alert hypotensive at this time, pt remains asymptomatic, pt placed in Trendelenburg position, call bell in reach. Pt denies any needs, wants or concerns.

## 2019-08-20 NOTE — ED Notes (Signed)
Gwynn Burly MD regarding pt hypotensive state, provider given pt update.

## 2019-08-20 NOTE — Progress Notes (Signed)
°  Date: 08/20/2019  Patient name: Penny Perez  Medical record number: [id number]  Date of birth: March 29, 1932   I have seen and evaluated Penny Perez and discussed their care with the Residency Team. Briefly Penny Perez is an 86 year old woman with PMH of HTN, Hypothyroidism, FM, OA, GERD< who presented with fatigue.  She was found ot have a low Na, acute on chronic kidney failure, low blood pressure.  Nephrology was consulted.    Vitals:   08/20/19 1350 08/20/19 1419  BP:  (!) 128/54  Pulse:  83  Resp:  16  Temp: 97.7 F (36.5 C) 98.6 F (37 C)  SpO2:  98%   General: Thin, elderly woman, NAD Eyes: Anicteric sclerae CV: RR, NR, 3/6 systolic murmur heard at RUSB, trace pedal edema Pulm: CTAB, no wheezing noted anteriorly Abd: Distended, soft, NT, +BS MSK: Thin extremities, decreased muscle bulk, no contractures Skin: Darkened skin on the legs, bruising on the legs, varicosities on the legs, large bruising on the upper arms.  Neuro: + for Muscle weakness, alert and oriented to person, place, time.   Assessment and Plan: I have seen and evaluated the patient as outlined above. I agree with the formulated Assessment and Plan as detailed in the residents' note, with the following changes:   1. AKI on CKD stage 3, hyponatremia - BMP q 4 ongoing.  She had a correction of 6 in 24 hours and then a further correction of 3-4 which has stabilized - IVF - Follow nephrology recommendations - Na Bicarb - Hold lasix and HCTZ - Urinary catheter in place - remains NPO - TSH normal - Cortisol normal - She has an Anion gap metabolic acidosis likely related to her kidney failure and other metabolic derangements.   Other issues per Dr. Lamont Snowball note.    Penny Falcon, MD 7/22/20212:30 PM

## 2019-08-20 NOTE — ED Notes (Signed)
Pt x2 assist to bedside commode, pt given call bell instructed on use of call bell, pt verbalized understanding.

## 2019-08-20 NOTE — ED Notes (Signed)
Pt assisted x2 back to bed, pt repositioned in most comfortable position, pt denies, needs, wants or concerns at this time.

## 2019-08-21 LAB — CBC
HCT: 28.4 % — ABNORMAL LOW (ref 36.0–46.0)
HCT: 28.4 % — ABNORMAL LOW (ref 36.0–46.0)
Hemoglobin: 9.6 g/dL — ABNORMAL LOW (ref 12.0–15.0)
Hemoglobin: 9.7 g/dL — ABNORMAL LOW (ref 12.0–15.0)
MCH: 27.5 pg (ref 26.0–34.0)
MCH: 28.2 pg (ref 26.0–34.0)
MCHC: 33.8 g/dL (ref 30.0–36.0)
MCHC: 34.2 g/dL (ref 30.0–36.0)
MCV: 81.4 fL (ref 80.0–100.0)
MCV: 82.6 fL (ref 80.0–100.0)
Platelets: 205 10*3/uL (ref 150–400)
Platelets: 217 10*3/uL (ref 150–400)
RBC: 3.49 MIL/uL — ABNORMAL LOW (ref 3.87–5.11)
RBC: 3.44 MIL/uL — ABNORMAL LOW (ref 3.87–5.11)
RDW: 14.7 % (ref 11.5–15.5)
RDW: 15.3 % (ref 11.5–15.5)
WBC: 5.5 10*3/uL (ref 4.0–10.5)
WBC: 5.6 10*3/uL (ref 4.0–10.5)
nRBC: 0 % (ref 0.0–0.2)
nRBC: 0 % (ref 0.0–0.2)

## 2019-08-21 LAB — RENAL FUNCTION PANEL
Albumin: 2.9 g/dL — ABNORMAL LOW (ref 3.5–5.0)
Anion gap: 11 (ref 5–15)
BUN: 46 mg/dL — ABNORMAL HIGH (ref 8–23)
CO2: 23 mmol/L (ref 22–32)
Calcium: 8.5 mg/dL — ABNORMAL LOW (ref 8.9–10.3)
Chloride: 97 mmol/L — ABNORMAL LOW (ref 98–111)
Creatinine, Ser: 1.8 mg/dL — ABNORMAL HIGH (ref 0.44–1.00)
GFR calc Af Amer: 29 mL/min — ABNORMAL LOW (ref >60)
GFR calc non Af Amer: 25 mL/min — ABNORMAL LOW (ref >60)
Glucose, Bld: 192 mg/dL — ABNORMAL HIGH (ref 70–99)
Phosphorus: 2.3 mg/dL — ABNORMAL LOW (ref 2.5–4.6)
Potassium: 3.7 mmol/L (ref 3.5–5.1)
Sodium: 131 mmol/L — ABNORMAL LOW (ref 135–145)

## 2019-08-21 LAB — HEPATITIS B CORE ANTIBODY, TOTAL: Hep B Core Total Ab: NONREACTIVE

## 2019-08-21 LAB — BASIC METABOLIC PANEL
Anion gap: 9 (ref 5–15)
BUN: 52 mg/dL — ABNORMAL HIGH (ref 8–23)
CO2: 22 mmol/L (ref 22–32)
Calcium: 8.5 mg/dL — ABNORMAL LOW (ref 8.9–10.3)
Chloride: 102 mmol/L (ref 98–111)
Creatinine, Ser: 1.96 mg/dL — ABNORMAL HIGH (ref 0.44–1.00)
GFR calc Af Amer: 27 mL/min — ABNORMAL LOW (ref >60)
GFR calc non Af Amer: 23 mL/min — ABNORMAL LOW (ref >60)
Glucose, Bld: 139 mg/dL — ABNORMAL HIGH (ref 70–99)
Potassium: 3.1 mmol/L — ABNORMAL LOW (ref 3.5–5.1)
Sodium: 133 mmol/L — ABNORMAL LOW (ref 135–145)

## 2019-08-21 LAB — MRSA PCR SCREENING: MRSA by PCR: NEGATIVE

## 2019-08-21 LAB — HEPATITIS B SURFACE ANTIBODY,QUALITATIVE: Hep B S Ab: NONREACTIVE

## 2019-08-21 LAB — CORTISOL-AM, BLOOD: Cortisol - AM: 24.9 ug/dL — ABNORMAL HIGH (ref 6.7–22.6)

## 2019-08-21 MED ORDER — ONDANSETRON HCL 4 MG PO TABS
4.0000 mg | ORAL_TABLET | Freq: Three times a day (TID) | ORAL | Status: DC | PRN
  Filled 2019-08-21: qty 1

## 2019-08-21 MED ORDER — SODIUM CHLORIDE 0.9 % IV SOLN: 100.0000 mL | INTRAVENOUS | Status: DC | PRN

## 2019-08-21 MED ORDER — HEPARIN SODIUM (PORCINE) 1000 UNIT/ML DIALYSIS: 1000.0000 [IU] | INTRAMUSCULAR | Status: DC | PRN

## 2019-08-21 MED ORDER — DEXTROSE 5 % IV SOLN: INTRAVENOUS | Status: DC

## 2019-08-21 MED ORDER — POTASSIUM CHLORIDE CRYS ER 20 MEQ PO TBCR
20.0000 meq | EXTENDED_RELEASE_TABLET | Freq: Two times a day (BID) | ORAL | Status: DC
  Administered 2019-08-21: 20 meq via ORAL
  Filled 2019-08-21: qty 1

## 2019-08-21 MED ORDER — POTASSIUM CHLORIDE CRYS ER 20 MEQ PO TBCR
20.0000 meq | EXTENDED_RELEASE_TABLET | Freq: Once | ORAL | Status: AC
  Administered 2019-08-21: 20 meq via ORAL
  Filled 2019-08-21: qty 1

## 2019-08-21 MED ORDER — ALTEPLASE 2 MG IJ SOLR: 2.0000 mg | Freq: Once | INTRAMUSCULAR | Status: DC | PRN

## 2019-08-21 MED ORDER — SODIUM CHLORIDE 0.9% FLUSH: 10.0000 mL | INTRAVENOUS | Status: DC | PRN

## 2019-08-21 MED ORDER — PENTAFLUOROPROP-TETRAFLUOROETH EX AERO: 1.0000 "application " | INHALATION_SPRAY | CUTANEOUS | Status: DC | PRN

## 2019-08-21 MED ORDER — LIDOCAINE HCL (PF) 1 % IJ SOLN: 5.0000 mL | INTRAMUSCULAR | Status: DC | PRN

## 2019-08-21 MED ORDER — SODIUM CHLORIDE 0.9% FLUSH: 10.0000 mL | Freq: Two times a day (BID) | INTRAVENOUS | Status: DC

## 2019-08-21 MED ORDER — LIDOCAINE-PRILOCAINE 2.5-2.5 % EX CREA: 1.0000 "application " | TOPICAL_CREAM | CUTANEOUS | Status: DC | PRN

## 2019-08-21 NOTE — Progress Notes (Addendum)
Subjective:   Hospital day: 2  Overnight event: Patient had HD yesterday. Na came back 132 at 4 PM so D5W was increased to 200cc/h. Na at 7PM 127 so switched to D5 - 1/2 NS. Na at 10 PM 134 so switched back to D5W at 100 cc/h  Patient is sitting on reclined chair during examination. She appears clinically improved. Her daughter said that she is more alert and more responsive. She states that she feels better today after her bath. She states that she has more energy after the dialysis yesterday. She also says she's been able to eat better today. She has no other complaints.   Objective:  Vital signs in last 24 hours: Vitals:   08/20/19 2130 08/20/19 2219 08/21/19 0220 08/21/19 0530  BP: (!) 118/62 (!) 123/61 (!) 120/59 127/66  Pulse: 93 92 90 76  Resp: 14 17 16 17   Temp: 98.6 F (37 C) 97.8 F (36.6 C) 98.5 F (36.9 C) 98.4 F (36.9 C)  TempSrc: Oral Oral Oral Oral  SpO2: 98% 100% 100% 96%    Physical Exam  Physical Exam Constitutional:      General: She is not in acute distress.    Appearance: She is not toxic-appearing.     Comments: She appears more alert than yesterday.   HENT:     Head: Normocephalic.  Eyes:     General: No scleral icterus.    Conjunctiva/sclera: Conjunctivae normal.  Cardiovascular:     Rate and Rhythm: Normal rate and regular rhythm.     Heart sounds: Murmur (3/6 murmur heard best upper sternal border) heard.   Pulmonary:     Effort: Pulmonary effort is normal. No respiratory distress.  Abdominal:     General: Bowel sounds are normal.  Musculoskeletal:        General: Normal range of motion.     Cervical back: Normal range of motion.     Right lower leg: No edema.     Left lower leg: No edema.  Skin:    General: Skin is warm.     Coloration: Skin is not jaundiced.  Neurological:     Mental Status: She is alert.  Psychiatric:        Mood and Affect: Mood normal.        Thought Content: Thought content normal.      Assessment/Plan: Penny Perez is an 86 year old woman with PMHx of HTN, hypothyroidism, fibromyalgia, OA, GERD, DDD who presented to the ED with fatigue, found to have AKI on CKD and hyperosmolar hyponatremia.Patient received a short session of dialysis yesterday and appears clinically better.  Principal Problem:   Acute on chronic renal failure w/ Uremia (HCC) Active Problems:   Essential hypertension   Anxiety   Hyperosmolar Hyponatremia   Normal anion gap metabolic acidosis   Right rib fracture   Uremia   Acute kidney injury on CKD Patient has history of CKD stage 4 and baseline creatine is 1.4. Creatine on admission is 5.8. Renal US is negative for obstruction or stone. This is likely Uremia secondary to dehydration 2/2 poor PO intake on top of HCTZ and Lasix used. FeNa calculated 0.5 % which is consistent with pre-renal. Nephrology is onboard with the treatment plan. Patient received a short session of dialysis yesterday and appears clinical improved today. Creatine, BUN also improved significantly. Per nephrology, patient will not require dialysis today. Will reevaluate tomorrow if she will need dialysis. At this time, we are unsure if patient will  be dialysis dependent in the future. This seems to be an AKI than a progression of her CKD.  - Hold her Lasix and HCTZ - No Dialysis today. Possible dialysis tomorrow - BMP BID - Strict I/O - Daily weight - PT/OT eval   Hypertonic Hyponatremia Sodium on admission 124. This is likely the results of HCTZ and Lasix used. Serum osmolality 312, which is due to her being dehydrated. Currently on D5W to prevent overcorrection of sodium.  - Current sodium 133.  - Continue fluid hydration with D5W 150 cc/h - Continue D5W 100 cc/h   Normal anion gap metabolic acidosis VBG shows pH of 7.2 with bicarb of 10 and pCO2 of 26. This is normal anion gap primary metabolic acidosis with inadequate respiratory compensation. Likely secondary to  renal loss of bicarb. Bicarb improved today after HD to 22 - Continue IV fluid - BMP BID   Hypotension Patient has no sign or symptoms of infection or sepsis. Lactic acid 0.6. This likely secondary to dehydration because her BP did improve with fluid. Cortisol level 19.6. TSH 0.4. Current BP 127/66. - Hold all her BP meds - Continue fluid resuscitation   Right rib fracture - Likely secondary to falls a few months ago. Conservative treatment with pain control at the moment.  - Tylenol 325 mg Q6h PRN - Oxycodone 2.5 mg TID PRN   Hypothyroidism - TSH 0.42 - Continue levothyroxine 100 mcg every other day   Asthma - Breo ellipta daily - Albuterol PRN    Diet: NPO IVF: D5W 100 cc/h VTE: Heparin SQ CODE: Full  Prior to Admission Living Arrangement: home Anticipated Discharge Location: home vs SNF Barriers to Discharge: Uremia Dispo: Anticipated discharge in approximately 2-3 day(s).   Penny Gerold, DO 08/21/2019, 6:01 AM Pager: [phone] After 5pm on weekdays and 1pm on weekends: On Call pager [phone]   Attending Physician Attestation Date: 08/21/2019  Patient name: Penny Perez  Medical record number: [id number]  Date of birth: 1932-10-03        I have seen and evaluated this patient and I have discussed the plan of care with the house staff. Please see Dr. Lamont Perez note for complete details. I concur with his findings and plan.    Penny Falcon, MD 08/21/2019, 3:11 PM

## 2019-08-21 NOTE — Progress Notes (Signed)
[facility] Progress Note    Assessment/ Plan:   1. Acute kidney Injury on ckd4 with significant azotemia and uremic symptoms -s/p temp hd cath 7/22 -s/p HD#1 yesterday 7/22 (utilized BFR of 150 with 1.5hr to prevent dialysis dysequilibrium syndrome). Patient reports that she had a rough time with IHD -given drastic drop in BUN will hold on HD today (especially as her symptoms have improved), will reassess tomorrow, maintain catheter -not sure if renal replacement therapy will be more permanent, if it is expected to be long term then would recommend addressing goals of care with family and patient as she stated that this is something that she may not want long term  -Avoid nephrotoxic medications including NSAIDs and iodinated intravenous contrast exposure unless the latter is absolutely indicated.  Preferred narcotic agents for pain control are hydromorphone, fentanyl, and methadone. Morphine should not be used. Avoid Baclofen and avoid oral sodium phosphate and magnesium citrate based laxatives / bowel preps. Continue strict Input and Output monitoring. Will monitor the patient closely with you and intervene or adjust therapy as indicated by changes in clinical status/labs  2.  Metabolic acidosis/acidemia likely secondary to -Agree with bicarb infusion 3.  Hyponatremia, improved.  Secondary to thiazide diuretics and low solute intake (urine sodium less than 10 on admit) 4.  Hypertension, improving with fluids 5.  Normocytic anemia, likely related to chronic kidney disease -Check iron panel  Subjective:   Dysgeusia, appetite improved as per patient. Feels more awake. Did not like dialysis tomorrow and felt like she could not tolerate it. Denies fevers, chest pain, SOB, changes in vision, dizziness, paraesthesias.   Objective:   BP (!) 131/67 (BP Location: Left Arm)    Pulse 69    Temp 97.8 F (36.6 C)    Resp 18    SpO2 96%   Intake/Output Summary (Last 24 hours) at  08/21/2019 1422 Last data filed at 08/21/2019 1200 Gross per 24 hour  Intake 665 ml  Output 773 ml  Net -108 ml   Weight change:   Physical Exam: Gen: nad, sitting up in bed CVS: Regular rate, S1-S2 Resp: Clear auscultation bilaterally Abd: Soft, nontender nondistended Ext: Trace edema bilateral lower extremities Neuro: Hard of hearing, speech clear and coherent, no asterixis  Imaging: US Renal  Result Date: 08/19/2019 CLINICAL DATA:  Acute kidney injury EXAM: RENAL / URINARY TRACT ULTRASOUND COMPLETE COMPARISON:  CT abdomen pelvis 08/19/2019 FINDINGS: Right Kidney: Renal measurements: 8.9 x 3 x 4 cm = volume: 50.9 mL. Cortical thinning and increased renal cortical echogenicity. No concerning mass, shadowing calculus or hydronephrosis. Left Kidney: Renal measurements: 8.5 x 3.2 x 3.4 cm = volume: 47.9 mL. More normal cortical echogenicity which is within normal limits. No mass, shadowing calculus or hydronephrosis. Bladder: Appears normal for degree of bladder distention. Other: None. IMPRESSION: Diminutive, likely atrophic kidneys bilaterally More pronounced cortical thinning and increased renal cortical echogenicity of the right kidney compatible with medical renal disease. Otherwise unremarkable renal ultrasound. Electronically Signed   By: Lovena Le M.D.   On: 08/19/2019 17:47   IR Fluoro Guide CV Line Right  Result Date: 08/20/2019 INDICATION: 86 year old female referred for temporary hemodialysis catheter EXAM: ULTRASOUND-GUIDED ACCESS RIGHT INTERNAL JUGULAR VEIN IMAGE GUIDED PLACEMENT OF TEMPORARY HEMODIALYSIS CATHETER MEDICATIONS: None ANESTHESIA/SEDATION: None. FLUOROSCOPY TIME:  Fluoroscopy Time: 0 minutes 24 seconds (4 9 mGy). COMPLICATIONS: None PROCEDURE: Informed written consent was obtained from the patient and the patient's family after a thorough discussion of the procedural risks, benefits  and alternatives. All questions were addressed. A timeout was performed prior to the  initiation of the procedure. The right neck and chest was prepped with chlorhexidine, and draped in the usual sterile fashion using maximum barrier technique (cap and mask, sterile gown, sterile gloves, large sterile sheet, hand hygiene and cutaneous antiseptic). Local anesthesia was attained by infiltration with 1% lidocaine without epinephrine. Ultrasound demonstrated patency of the right internal jugular vein, and this was documented with an image. Under real-time ultrasound guidance, this vein was accessed with a 21 gauge micropuncture needle and image documentation was performed. A small dermatotomy was made at the access site with an 11 scalpel. A 0.018" wire was advanced into the SVC and the access needle exchanged for a 106F micropuncture vascular sheath. The 0.018" wire was then removed and a 0.035" wire advanced into the IVC. Upon withdrawal of the 018 wire, the wire was marked for appropriate length of the internal portion of the catheter. A 20 cm catheter was selected. Skin and subcutaneous tissues were serially dilated. Catheter was placed on the wire. The catheter tip is positioned in the upper right atrium. This was documented with a spot image. Both ports of the hemodialysis catheter were then tested for excellent function. The ports were then locked with heparinized lock. Patient tolerated the procedure well and remained hemodynamically stable throughout. No complications were encountered and no significant blood loss was encountered. IMPRESSION: Status post right IJ temporary hemodialysis catheter. Catheter may be converted if needed. Signed, Dulcy Fanny. Dellia Nims, [facility] . Electronically Signed   By: Corrie Mckusick D.O.   On: 08/20/2019 17:04   IR US Guide Vasc Access Right  Result Date: 08/20/2019 INDICATION: 86 year old female referred for temporary hemodialysis catheter EXAM: ULTRASOUND-GUIDED ACCESS RIGHT INTERNAL JUGULAR VEIN  IMAGE GUIDED PLACEMENT OF TEMPORARY HEMODIALYSIS CATHETER MEDICATIONS: None ANESTHESIA/SEDATION: None. FLUOROSCOPY TIME:  Fluoroscopy Time: 0 minutes 24 seconds (4 9 mGy). COMPLICATIONS: None PROCEDURE: Informed written consent was obtained from the patient and the patient's family after a thorough discussion of the procedural risks, benefits and alternatives. All questions were addressed. A timeout was performed prior to the initiation of the procedure. The right neck and chest was prepped with chlorhexidine, and draped in the usual sterile fashion using maximum barrier technique (cap and mask, sterile gown, sterile gloves, large sterile sheet, hand hygiene and cutaneous antiseptic). Local anesthesia was attained by infiltration with 1% lidocaine without epinephrine. Ultrasound demonstrated patency of the right internal jugular vein, and this was documented with an image. Under real-time ultrasound guidance, this vein was accessed with a 21 gauge micropuncture needle and image documentation was performed. A small dermatotomy was made at the access site with an 11 scalpel. A 0.018" wire was advanced into the SVC and the access needle exchanged for a 106F micropuncture vascular sheath. The 0.018" wire was then removed and a 0.035" wire advanced into the IVC. Upon withdrawal of the 018 wire, the wire was marked for appropriate length of the internal portion of the catheter. A 20 cm catheter was selected. Skin and subcutaneous tissues were serially dilated. Catheter was placed on the wire. The catheter tip is positioned in the upper right atrium. This was documented with a spot image. Both ports of the hemodialysis catheter were then tested for excellent function. The ports were then locked with heparinized lock. Patient tolerated the procedure well and remained hemodynamically stable throughout. No complications were encountered and no significant blood loss was encountered.  IMPRESSION: Status post right IJ temporary  hemodialysis catheter. Catheter may be converted if needed. Signed, Dulcy Fanny. Dellia Nims, [facility] . Electronically Signed   By: Corrie Mckusick D.O.   On: 08/20/2019 17:04    Labs: BMET Recent Labs  Lab 08/20/19 0505 08/20/19 0505 08/20/19 0821 08/20/19 0947 08/20/19 1238 08/20/19 1626 08/20/19 1939 08/20/19 2219 08/21/19 0825  NA 130*   < > 133* 132* 133* 132* 127* 134* 133*  K 4.2   < > 4.4 4.7 4.2 4.2 3.7 3.3* 3.1*  CL 108  --   --  108 106 104 100 100 102  CO2 8*  --   --  <7* 11* 15* 15* 21* 22  GLUCOSE 72  --   --  58* 118* 165* 181* 104* 139*  BUN 124*  --   --  129* 121* 118* 111* 56* 52*  CREATININE 4.15*  --   --  4.14* 3.86* 3.67* 3.34* 1.97* 1.96*  CALCIUM 8.6*  --   --  8.7* 8.8* 8.9 8.5* 8.6* 8.5*  PHOS  --   --   --   --   --   --  4.9*  --   --    < > = values in this interval not displayed.   CBC Recent Labs  Lab 08/19/19 0734 08/20/19 0821 08/21/19 0825  WBC 6.2  --  5.5  HGB 9.8* 9.9* 9.6*  HCT 30.0* 29.0* 28.4*  MCV 86.0  --  81.4  PLT 308  --  205    Medications:     Chlorhexidine Gluconate Cloth  6 each Topical Q0600   fluticasone furoate-vilanterol  1 puff Inhalation Daily   heparin  5,000 Units Subcutaneous Q8H   levothyroxine  100 mcg Oral QODAY   sodium bicarbonate  1,300 mg Oral TID   sodium chloride flush  10-40 mL Intracatheter Q12H      Gean Quint, MD [facility] 08/21/2019, 2:22 PM

## 2019-08-21 NOTE — Hospital Course (Addendum)
AKI on CKD Ms. Penny Perez is an 86 year old woman with PMHx of HTN, hypothyroidism, fibromyalgia, OA, GERD, DDD who presented to the ED with worsening fatigue and poor PO intake since her last admission in April 2021. Patient has history of CKD stage 4 and baseline creatine is 1.4. Creatine on admission is 5.8. Renal US is negative for obstruction or stone. This is likely Uremia secondary to dehydration 2/2 poor PO intake on top of HCTZ and Lasix used. FeNa calculated 0.5 % which is consistent with pre-renal. Patient received 1L of LR and 1L of NS. Given her clinical picture and worsening metabolic acidosis, patient received 1 short session of HD on 7/22. She improved significantly after the HD and continued to do well. Her creatine was back to baseline on 7/25 and was stable. Patient was no longer require HD and her dialysis cath was removed.  Patient was discharged to [facility] for rehab.  Hypertonic Hyponatremia  Sodium on admission 124. This is likely the results of HCTZ and Lasix used. Serum osmolality 312, which is due to her being dehydrated. Sodium was corrected at goal of not increase more than 4-6 mmol/L in 24h. Her mental status was stable throughout this admission.  Her sodium was stable.  Normal anion gap metabolic acidosis VBG shows pH of 7.2 with bicarb of 10 and pCO2 of 26. This is normal anion gap primary metabolic acidosis with inadequate respiratory compensation. Likely secondary to renal loss of bicarb. Sodium bicarb given PO with 1 amp of bicarb IV. Bicarb improved after dialysis and was stable.  Right rib fracture Likely secondary to falls a few months ago. Patient had conservative treatment with pain control with Tylenol and Oxycodone.

## 2019-08-21 NOTE — Evaluation (Signed)
Physical Therapy Evaluation Patient Details Name: Penny Perez MRN: [id number] DOB: 1932-08-24 Today's Date: 08/21/2019   History of Present Illness  Pt is an 86 year old woman presenting to the hospital with CRF. Pt has a PMHx of Essential HTN, hypothyroidism, fibromyalgia, OA, GERD, DDD who presented to the ED with fatigue, found to have AKI on CKD and hyperosmolar hyponatremia. Recent hx of R Rib fx 6-9. Pt is s/p R IJ HD catheter placement and dialysis initiated during admission  Clinical Impression  Pt was evaluated today for the above listed diagnosis and the impairments listed below. Pt required min assist with sit to stand transfer with the RW. Pt also required min assist with ambulation with RW. Pt lives alone in a three story home and feel she will have difficulty with stair navigation. Recommending SNF level therapy given deficits listed below. She would continue to benefit from acute physical therapy services in order to achieve more independence in functional tasks. Will continue to follow for pt care.     Follow Up Recommendations SNF    Equipment Recommendations  None recommended by PT    Recommendations for Other Services       Precautions / Restrictions Precautions Precautions: Fall Precaution Comments: pt has had 2 recent falls within the home, unsure of what caused the fall Restrictions Weight Bearing Restrictions: No      Mobility  Bed Mobility               General bed mobility comments: pt in reclining chair when entered the room  Transfers Overall transfer level: Needs assistance Equipment used: Rolling walker (2 wheeled) Transfers: Sit to/from Stand Sit to Stand: Min assist         General transfer comment: pt required min assist with sit to stand with PT standing in front for safety, pt required use of UE power up for transfer. After standing, pt given RW for increased stability. Pt required RW and min assist with stand to sit transfer.    Ambulation/Gait Ambulation/Gait assistance: Min assist Gait Distance (Feet): 25 Feet Assistive device: Rolling walker (2 wheeled) Gait Pattern/deviations: Step-through pattern;Decreased step length - right;Decreased step length - left;Decreased stride length;Decreased dorsiflexion - right;Decreased dorsiflexion - left Gait velocity: decreased   General Gait Details: pt was very slow and guarded with ambulation and required min assist with the RW. Pt had noted forward trunk lean and was cued to stand tall   Stairs            Wheelchair Mobility    Modified Rankin (Stroke Patients Only)       Balance Overall balance assessment: Needs assistance Sitting-balance support: No upper extremity supported;Feet unsupported Sitting balance-Leahy Scale: Fair Sitting balance - Comments: pt able to come to edge of chair and not need UE/LE support to maintain balance.    Standing balance support: Bilateral upper extremity supported;During functional activity Standing balance-Leahy Scale: Poor Standing balance comment: pt required BUE support with RW or HHA with min assist                             Pertinent Vitals/Pain Pain Assessment: Faces Faces Pain Scale: Hurts little more Pain Location: "everywhere" Pain Descriptors / Indicators: Grimacing;Guarding Pain Intervention(s): Limited activity within patient's tolerance;Monitored during session    Home Living Family/patient expects to be discharged to:: Private residence Living Arrangements: Alone Available Help at Discharge: Family Type of Home: House Home Access: Stairs to enter  Entrance Stairs-Rails: Left Entrance Stairs-Number of Steps: 5 Home Layout: Multi-level Home Equipment: Walker - 2 wheels Additional Comments: pt says she has a 6,000 sqft home that is 3 floors. She mainly lives on the first floor but needs to get to other levels. Pt has master bedroom and bath on first floor with walk-in shower but  prefers to use the shower/tub on the second floor. Has a shower seat in the walk in shower but no railings     Prior Function Level of Independence: Independent               Hand Dominance        Extremity/Trunk Assessment   Upper Extremity Assessment Upper Extremity Assessment: Defer to OT evaluation    Lower Extremity Assessment Lower Extremity Assessment: Generalized weakness    Cervical / Trunk Assessment Cervical / Trunk Assessment: Kyphotic  Communication   Communication: HOH  Cognition Arousal/Alertness: Awake/alert Behavior During Therapy: WFL for tasks assessed/performed Overall Cognitive Status: Within Functional Limits for tasks assessed                                        General Comments      Exercises     Assessment/Plan    PT Assessment Patient needs continued PT services  PT Problem List Decreased strength;Decreased range of motion;Decreased activity tolerance;Decreased balance;Decreased mobility;Decreased coordination;Decreased safety awareness       PT Treatment Interventions DME instruction;Stair training;Gait training;Functional mobility training;Therapeutic activities;Therapeutic exercise;Balance training;Neuromuscular re-education;Patient/family education    PT Goals (Current goals can be found in the Care Plan section)  Acute Rehab PT Goals Patient Stated Goal: return home PT Goal Formulation: With patient Time For Goal Achievement: 09/04/19 Potential to Achieve Goals: Fair    Frequency Min 2X/week   Barriers to discharge Decreased caregiver support;Inaccessible home environment pt lives alone in three story home    Co-evaluation               AM-PAC PT "6 Clicks" Mobility  Outcome Measure Help needed turning from your back to your side while in a flat bed without using bedrails?: A Little Help needed moving from lying on your back to sitting on the side of a flat bed without using bedrails?: A  Little Help needed moving to and from a bed to a chair (including a wheelchair)?: A Little Help needed standing up from a chair using your arms (e.g., wheelchair or bedside chair)?: A Little Help needed to walk in hospital room?: A Little Help needed climbing 3-5 steps with a railing? : A Lot 6 Click Score: 17    End of Session Equipment Utilized During Treatment: Gait belt Activity Tolerance: Patient limited by fatigue Patient left: in chair;with call Floride Hutmacher/phone within reach Nurse Communication: Mobility status PT Visit Diagnosis: Unsteadiness on feet (R26.81);Other abnormalities of gait and mobility (R26.89);Repeated falls (R29.6);Muscle weakness (generalized) (M62.81);Difficulty in walking, not elsewhere classified (R26.2)    Time: 4259-5638 PT Time Calculation (min) (ACUTE ONLY): 32 min   Charges:   PT Evaluation $PT Eval Moderate Complexity: 1 Mod PT Treatments $Gait Training: 8-22 mins       Gloriann Loan, SPT  Acute Rehabilitation Services  Office: [phone]  08/21/2019, 6:19 PM

## 2019-08-22 LAB — CBC
HCT: 28.4 % — ABNORMAL LOW (ref 36.0–46.0)
Hemoglobin: 9.6 g/dL — ABNORMAL LOW (ref 12.0–15.0)
MCH: 27.9 pg (ref 26.0–34.0)
MCHC: 33.8 g/dL (ref 30.0–36.0)
MCV: 82.6 fL (ref 80.0–100.0)
Platelets: 204 10*3/uL (ref 150–400)
RBC: 3.44 MIL/uL — ABNORMAL LOW (ref 3.87–5.11)
RDW: 15.3 % (ref 11.5–15.5)
WBC: 5.5 10*3/uL (ref 4.0–10.5)
nRBC: 0 % (ref 0.0–0.2)

## 2019-08-22 LAB — BASIC METABOLIC PANEL
Anion gap: 9 (ref 5–15)
BUN: 38 mg/dL — ABNORMAL HIGH (ref 8–23)
CO2: 24 mmol/L (ref 22–32)
Calcium: 8.4 mg/dL — ABNORMAL LOW (ref 8.9–10.3)
Chloride: 97 mmol/L — ABNORMAL LOW (ref 98–111)
Creatinine, Ser: 1.54 mg/dL — ABNORMAL HIGH (ref 0.44–1.00)
GFR calc Af Amer: 36 mL/min — ABNORMAL LOW (ref >60)
GFR calc non Af Amer: 31 mL/min — ABNORMAL LOW (ref >60)
Glucose, Bld: 118 mg/dL — ABNORMAL HIGH (ref 70–99)
Potassium: 3.4 mmol/L — ABNORMAL LOW (ref 3.5–5.1)
Sodium: 130 mmol/L — ABNORMAL LOW (ref 135–145)

## 2019-08-22 LAB — GLUCOSE, CAPILLARY
Glucose-Capillary: 111 mg/dL — ABNORMAL HIGH (ref 70–99)
Glucose-Capillary: 126 mg/dL — ABNORMAL HIGH (ref 70–99)
Glucose-Capillary: 107 mg/dL — ABNORMAL HIGH (ref 70–99)
Glucose-Capillary: 91 mg/dL (ref 70–99)

## 2019-08-22 LAB — HEMOGLOBIN A1C
Hgb A1c MFr Bld: 6.3 % — ABNORMAL HIGH (ref 4.8–5.6)
Mean Plasma Glucose: 134.11 mg/dL

## 2019-08-22 MED ORDER — INSULIN ASPART 100 UNIT/ML ~~LOC~~ SOLN
0.0000 [IU] | Freq: Three times a day (TID) | SUBCUTANEOUS | Status: DC
  Administered 2019-08-22: 1 [IU] via SUBCUTANEOUS

## 2019-08-22 MED ORDER — POTASSIUM CHLORIDE CRYS ER 20 MEQ PO TBCR
20.0000 meq | EXTENDED_RELEASE_TABLET | Freq: Once | ORAL | Status: AC
  Administered 2019-08-22: 20 meq via ORAL
  Filled 2019-08-22: qty 1

## 2019-08-22 MED ORDER — ACETAMINOPHEN 325 MG PO TABS
650.0000 mg | ORAL_TABLET | Freq: Four times a day (QID) | ORAL | Status: DC | PRN
  Administered 2019-08-22 – 2019-08-25 (×4): 650 mg via ORAL
  Filled 2019-08-22 (×4): qty 2

## 2019-08-22 MED ORDER — LIDOCAINE VISCOUS HCL 2 % MT SOLN
15.0000 mL | Freq: Four times a day (QID) | OROMUCOSAL | Status: DC | PRN
  Administered 2019-08-25: 15 mL via OROMUCOSAL
  Filled 2019-08-22: qty 15

## 2019-08-22 MED ORDER — BOOST / RESOURCE BREEZE PO LIQD CUSTOM
1.0000 | Freq: Three times a day (TID) | ORAL | Status: DC
  Administered 2019-08-27: 1 via ORAL

## 2019-08-22 MED ORDER — OXYCODONE HCL 5 MG PO TABS
2.5000 mg | ORAL_TABLET | Freq: Once | ORAL | Status: AC
  Administered 2019-08-22: 2.5 mg via ORAL

## 2019-08-22 NOTE — Progress Notes (Signed)
[facility] Progress Note    Assessment/ Plan:   1. Acute kidney Injury on ckd4 with significant azotemia and uremic symptoms -s/p temp hd cath 7/22 -s/p HD#1 7/22 (utilized BFR of 150 with 1.5hr to prevent dialysis dysequilibrium syndrome). Patient reports that she had a rough time with IHD initially. Her symptoms better.  Has more of an appetite.  Still has some dysgeusia.  Urinating more.  Not as nauseated with no episodes of vomiting anymore. -given drastic drop in BUN will continue to hold on HD (especially as her symptoms have improved), maintain catheter for now -Would recommend readdressing goals of care if further dialysis is required -Avoid nephrotoxic medications including NSAIDs and iodinated intravenous contrast exposure unless the latter is absolutely indicated.  Preferred narcotic agents for pain control are hydromorphone, fentanyl, and methadone. Morphine should not be used. Avoid Baclofen and avoid oral sodium phosphate and magnesium citrate based laxatives / bowel preps. Continue strict Input and Output monitoring. Will monitor the patient closely with you and intervene or adjust therapy as indicated by changes in clinical status/labs  2.  Metabolic acidosis/acidemia likely secondary to AKI, improved 3.  Hyponatremia, stable.  Secondary to thiazide diuretics and low solute intake (urine sodium less than 10 on admit) -Stopped hypotonic fluids 4.  Hypotension, improved 5.  Normocytic anemia, likely related to chronic kidney disease -Check iron panel  Subjective:   Appetite significantly better, no longer having vomiting.  Still has some nausea and dysgeusia.  Denies any shortness of breath.   Objective:   BP (!) 130/73 (BP Location: Right Arm)   Pulse 78   Temp 97.8 F (36.6 C) (Oral)   Resp 16   SpO2 97%   Intake/Output Summary (Last 24 hours) at 08/22/2019 1352 Last data filed at 08/22/2019 1300 Gross per 24 hour  Intake 3055.26 ml  Output 450 ml   Net 2605.26 ml   Weight change:   Physical Exam: Gen: nad, sitting up in chair CVS: Regular rate, S1-S2 Resp: Clear auscultation bilaterally, normal work of breathing  Abd: Soft, nontender nondistended Ext: Trace edema bilateral lower extremities Neuro: Hard of hearing, speech clear and coherent, no asterixis  Imaging: IR Fluoro Guide CV Line Right  Result Date: 08/20/2019 INDICATION: 86 year old female referred for temporary hemodialysis catheter EXAM: ULTRASOUND-GUIDED ACCESS RIGHT INTERNAL JUGULAR VEIN IMAGE GUIDED PLACEMENT OF TEMPORARY HEMODIALYSIS CATHETER MEDICATIONS: None ANESTHESIA/SEDATION: None. FLUOROSCOPY TIME:  Fluoroscopy Time: 0 minutes 24 seconds (4 9 mGy). COMPLICATIONS: None PROCEDURE: Informed written consent was obtained from the patient and the patient's family after a thorough discussion of the procedural risks, benefits and alternatives. All questions were addressed. A timeout was performed prior to the initiation of the procedure. The right neck and chest was prepped with chlorhexidine, and draped in the usual sterile fashion using maximum barrier technique (cap and mask, sterile gown, sterile gloves, large sterile sheet, hand hygiene and cutaneous antiseptic). Local anesthesia was attained by infiltration with 1% lidocaine without epinephrine. Ultrasound demonstrated patency of the right internal jugular vein, and this was documented with an image. Under real-time ultrasound guidance, this vein was accessed with a 21 gauge micropuncture needle and image documentation was performed. A small dermatotomy was made at the access site with an 11 scalpel. A 0.018" wire was advanced into the SVC and the access needle exchanged for a 60F micropuncture vascular sheath. The 0.018" wire was then removed and a 0.035" wire advanced into the IVC. Upon withdrawal of the 018 wire, the wire was marked  for appropriate length of the internal portion of the catheter. A 20 cm catheter was  selected. Skin and subcutaneous tissues were serially dilated. Catheter was placed on the wire. The catheter tip is positioned in the upper right atrium. This was documented with a spot image. Both ports of the hemodialysis catheter were then tested for excellent function. The ports were then locked with heparinized lock. Patient tolerated the procedure well and remained hemodynamically stable throughout. No complications were encountered and no significant blood loss was encountered. IMPRESSION: Status post right IJ temporary hemodialysis catheter. Catheter may be converted if needed. Signed, Dulcy Fanny. Dellia Nims, [place] [facility] . Electronically Signed   By: Corrie Mckusick D.O.   On: 08/20/2019 17:04   IR US Guide Vasc Access Right  Result Date: 08/20/2019 INDICATION: 86 year old female referred for temporary hemodialysis catheter EXAM: ULTRASOUND-GUIDED ACCESS RIGHT INTERNAL JUGULAR VEIN IMAGE GUIDED PLACEMENT OF TEMPORARY HEMODIALYSIS CATHETER MEDICATIONS: None ANESTHESIA/SEDATION: None. FLUOROSCOPY TIME:  Fluoroscopy Time: 0 minutes 24 seconds (4 9 mGy). COMPLICATIONS: None PROCEDURE: Informed written consent was obtained from the patient and the patient's family after a thorough discussion of the procedural risks, benefits and alternatives. All questions were addressed. A timeout was performed prior to the initiation of the procedure. The right neck and chest was prepped with chlorhexidine, and draped in the usual sterile fashion using maximum barrier technique (cap and mask, sterile gown, sterile gloves, large sterile sheet, hand hygiene and cutaneous antiseptic). Local anesthesia was attained by infiltration with 1% lidocaine without epinephrine. Ultrasound demonstrated patency of the right internal jugular vein, and this was documented with an image. Under real-time ultrasound guidance, this vein was accessed with a 21 gauge micropuncture needle  and image documentation was performed. A small dermatotomy was made at the access site with an 11 scalpel. A 0.018" wire was advanced into the SVC and the access needle exchanged for a 61F micropuncture vascular sheath. The 0.018" wire was then removed and a 0.035" wire advanced into the IVC. Upon withdrawal of the 018 wire, the wire was marked for appropriate length of the internal portion of the catheter. A 20 cm catheter was selected. Skin and subcutaneous tissues were serially dilated. Catheter was placed on the wire. The catheter tip is positioned in the upper right atrium. This was documented with a spot image. Both ports of the hemodialysis catheter were then tested for excellent function. The ports were then locked with heparinized lock. Patient tolerated the procedure well and remained hemodynamically stable throughout. No complications were encountered and no significant blood loss was encountered. IMPRESSION: Status post right IJ temporary hemodialysis catheter. Catheter may be converted if needed. Signed, Dulcy Fanny. Dellia Nims, [place] [facility] . Electronically Signed   By: Corrie Mckusick D.O.   On: 08/20/2019 17:04    Labs: BMET Recent Labs  Lab 08/20/19 1238 08/20/19 1626 08/20/19 1939 08/20/19 2219 08/21/19 0825 08/21/19 1911 08/22/19 0553  NA 133* 132* 127* 134* 133* 131* 130*  K 4.2 4.2 3.7 3.3* 3.1* 3.7 3.4*  CL 106 104 100 100 102 97* 97*  CO2 11* 15* 15* 21* 22 23 24   GLUCOSE 118* 165* 181* 104* 139* 192* 118*  BUN 121* 118* 111* 56* 52* 46* 38*  CREATININE 3.86* 3.67* 3.34* 1.97* 1.96* 1.80* 1.54*  CALCIUM 8.8* 8.9 8.5* 8.6* 8.5* 8.5* 8.4*  PHOS  --   --  4.9*  --   --  2.3*  --  CBC Recent Labs  Lab 08/19/19 0734 08/19/19 0734 08/20/19 0821 08/21/19 0825 08/21/19 1911 08/22/19 0553  WBC 6.2  --   --  5.5 5.6 5.5  HGB 9.8*   < > 9.9* 9.6* 9.7* 9.6*  HCT 30.0*   < > 29.0* 28.4* 28.4* 28.4*  MCV 86.0  --    --  81.4 82.6 82.6  PLT 308  --   --  205 217 204   < > = values in this interval not displayed.    Medications:    . Chlorhexidine Gluconate Cloth  6 each Topical Q0600  . feeding supplement  1 Container Oral TID BM  . fluticasone furoate-vilanterol  1 puff Inhalation Daily  . heparin  5,000 Units Subcutaneous Q8H  . insulin aspart  0-9 Units Subcutaneous TID WC  . levothyroxine  100 mcg Oral QODAY  . sodium bicarbonate  1,300 mg Oral TID  . sodium chloride flush  10-40 mL Intracatheter Q12H      Gean Quint, MD [facility] 08/22/2019, 1:52 PM

## 2019-08-22 NOTE — Progress Notes (Signed)
   Subjective: HD#3   Overnight: No acute events  Today, Lili Harts reports resolution to her nausea and vomiting. She did request that her diet be changed. She complained of sore mouth which her daughter reports is chronic. She worked with Physical therapy yesterday who recommended SNF for which she is agreeable.   Objective:  Vital signs in last 24 hours: Vitals:   08/21/19 0902 08/21/19 1637 08/21/19 2029 08/22/19 0531  BP: (!) 131/67 127/68 (!) 130/74 (!) 136/75  Pulse: 69 72 76 75  Resp: 18 18 17 16   Temp: 97.8 F (36.6 C) 98.5 F (36.9 C) 98.3 F (36.8 C) 97.9 F (36.6 C)  TempSrc:   Oral Oral  SpO2: 96% 97% 96% 99%   Const: In no apparent distress, lying comfortably in bed, conversational HEENT: No mucosal inflammation CV: Systolic murmur appreciated throughout all lung field.   Assessment/Plan:  Principal Problem:   Acute on chronic renal failure w/ Uremia (HCC) Active Problems:   Essential hypertension   Anxiety   Hyperosmolar Hyponatremia   Normal anion gap metabolic acidosis   Right rib fracture   Uremia  [name] is an 86 year old woman with PMHx of HTN, hypothyroidism, fibromyalgia, OA, GERD, DDD who presented to the ED with fatigue, found to have AKI on CKD andhyperosmolarhyponatremia.Patient received a short session of dialysis 08/20/2019 with remarkable improvement   Acute kidney injury on CKD 3 Urine output 950 cc in the past 24 hours.  Uremic symptoms have resolved since dialysis.  Serum creatinine improved today to 1.5 from 1.8 yesterday, BUN today is 38.  Was evaluated by physical therapy who recommends SNF placement and she is agreeable - Appreciate nephrology recommendations - Continue to hold her Lasix and HCTZ - BMP  daily - Strict I/O - Daily weight   Hypertonic Hyponatremia Stable - Continue fluid hydrationwith D5W 75cc/hr - Will switch to D5-1/2 NS if sNa <130    Normal anion gap metabolic acidosis Resolved -  Continue IV fluid   Hypotension-improved  - Hold all her BP meds - Continue fluid resuscitation   Right rib fracture - Likely secondary to falls a few months ago. Conservative treatment with pain control at the moment.  - Tylenol 325 mg Q6h PRN - Oxycodone 2.5 mg TID PRN   Hypothyroidism -TSH 0.42 - Continue levothyroxine 100 mcg every other day   Asthma - Breo ellipta daily - Albuterol PRN   Diet:NPO IVF:D5W 75 cc/h NGE:[id number] SQ CODE:Full  Prior to Admission Living Arrangement:home Anticipated Discharge Location: SNF Barriers to [place], Pending SNF placement  Dispo: Anticipated discharge in approximately2-3day(s).    Jean Rosenthal, MD 08/22/2019, 6:18 AM Pager: [phone] Internal Medicine Teaching Service After 5pm on weekdays and 1pm on weekends: On Call pager: [phone]

## 2019-08-22 NOTE — Plan of Care (Signed)
  Problem: Education: Goal: Knowledge of disease and its progression will improve Outcome: Progressing   

## 2019-08-22 NOTE — NC FL2 (Signed)
[place] LEVEL OF CARE SCREENING TOOL     IDENTIFICATION  Patient Name: Penny Perez Birthdate: Jun 09, 1932 Sex: female Admission Date (Current Location): 08/19/2019  [facility] and [place] Number:  Herbalist and Address:  [facility], [place] [address], [place], [place] [address]      Provider Number: [id number]  Attending Physician Name and Address:  Sid Falcon, MD  Relative Name and Phone Number:  [name]    Current Level of Care: Hospital Recommended Level of Care: [place] Prior Approval Number:    Date Approved/Denied:   PASRR Number: PENDING  Discharge Plan: SNF    Current Diagnoses: Patient Active Problem List   Diagnosis Date Noted  . Uremia   . Acute renal failure (ARF) ([place]) 08/19/2019  . Hyperosmolar Hyponatremia 08/19/2019  . Normal anion gap metabolic acidosis 93/81/8299  . Right rib fracture 08/19/2019  . Acute on chronic renal failure w/ Uremia ([place]) 05/05/2019  . Essential hypertension 05/05/2019  . C. difficile colitis 05/05/2019  . Scoliosis of thoracic spine 05/05/2019  . Neck pain 05/05/2019  . Anxiety 05/05/2019  . Muscle spasms of neck 05/05/2019  . Acute renal failure superimposed on stage 3 chronic kidney disease ([place]) 05/04/2019  . Hormone replacement therapy (HRT) 03/22/2014  . HYPERTENSION 12/07/2009  . Acute bronchitis 12/07/2009  . ALLERGIC RHINITIS 12/07/2009  . OSTEOARTHRITIS, MULTIPLE JOINTS 12/07/2009  . CAROTID BRUIT, LEFT 12/07/2009    Orientation RESPIRATION BLADDER Height & Weight     Self, Time, Situation, Place  Normal Continent Weight:   Height:     BEHAVIORAL SYMPTOMS/MOOD NEUROLOGICAL BOWEL NUTRITION STATUS      Continent Diet (See discharge summary)  AMBULATORY STATUS COMMUNICATION OF NEEDS Skin   Limited Assist Verbally Normal                       Personal Care Assistance Level of Assistance  Bathing, Dressing, Feeding Bathing  Assistance: Limited assistance Feeding assistance: Independent Dressing Assistance: Limited assistance     Functional Limitations Info  Sight, Hearing, Speech Sight Info: Adequate Hearing Info: Impaired (Hard of Hearing) Speech Info: Adequate    SPECIAL CARE FACTORS FREQUENCY  PT (By licensed PT), OT (By licensed OT)     PT Frequency: 5x a week OT Frequency: 5x a week            Contractures Contractures Info: Not present    Additional Factors Info  Code Status, Allergies Code Status Info: Full Allergies Info: Linezolid, Sulfa Antibiotics, Ampicillin, Cephalexin, Clarithromycin, Codeine, Doxycycline, Erythromycin, Gatifloxacin, Hydrocodone, Penicillins, Latex, Tape           Current Medications (08/22/2019):  This is the current hospital active medication list Current Facility-Administered Medications  Medication Dose Route Frequency Provider Last Rate Last Admin  . 0.9 %  sodium chloride infusion  100 mL Intravenous PRN Gean Quint, MD      . 0.9 %  sodium chloride infusion  100 mL Intravenous PRN Gean Quint, MD      . acetaminophen (TYLENOL) tablet 650 mg  650 mg Oral Q6H PRN Agyei, Obed K, MD      . albuterol (PROVENTIL) (2.5 MG/3ML) 0.083% nebulizer solution 2.5 mg  2.5 mg Inhalation Q4H PRN Agyei, Obed K, MD      . alteplase (CATHFLO ACTIVASE) injection 2 mg  2 mg Intracatheter Once PRN Gean Quint, MD      . Chlorhexidine Gluconate Cloth 2 %  PADS 6 each  6 each Topical Q0600 Gean Quint, MD   6 each at 08/22/19 0604  . feeding supplement (BOOST / RESOURCE BREEZE) liquid 1 Container  1 Container Oral TID BM Agyei, Obed K, MD      . fluticasone furoate-vilanterol (BREO ELLIPTA) 100-25 MCG/INH 1 puff  1 puff Inhalation Daily Jean Rosenthal, MD   1 puff at 08/21/19 0807  . heparin injection 1,000 Units  1,000 Units Dialysis PRN Gean Quint, MD      . heparin injection 5,000 Units  5,000 Units Subcutaneous Q8H Jean Rosenthal, MD   5,000 Units at 08/22/19 1459  .  insulin aspart (novoLOG) injection 0-9 Units  0-9 Units Subcutaneous TID WC Agyei, Obed K, MD   1 Units at 08/22/19 1300  . levothyroxine (SYNTHROID) tablet 100 mcg  100 mcg Oral Demetrio Lapping, MD   100 mcg at 08/21/19 0647  . lidocaine (PF) (XYLOCAINE) 1 % injection 5 mL  5 mL Intradermal PRN Gean Quint, MD      . lidocaine (XYLOCAINE) 2 % viscous mouth solution 15 mL  15 mL Mouth/Throat Q6H PRN Agyei, Obed K, MD      . lidocaine-prilocaine (EMLA) cream 1 application  1 application Topical PRN Gean Quint, MD      . LORazepam (ATIVAN) tablet 0.5 mg  0.5 mg Oral BID PRN Gilles [name] B, MD   0.5 mg at 08/22/19 0033   Or  . LORazepam (ATIVAN) injection 0.5 mg  0.5 mg Intramuscular BID PRN Gilles [name] B, MD      . ondansetron [facility]) tablet 4 mg  4 mg Oral Q8H PRN Gaylan Gerold, DO      . oxyCODONE (Oxy IR/ROXICODONE) immediate release tablet 2.5 mg  2.5 mg Oral TID PRN Jean Rosenthal, MD   2.5 mg at 08/22/19 0947  . pentafluoroprop-tetrafluoroeth (GEBAUERS) aerosol 1 application  1 application Topical PRN Gean Quint, MD      . polyethylene glycol (MIRALAX / GLYCOLAX) packet 17 g  17 g Oral Daily PRN Agyei, Obed K, MD      . sodium bicarbonate tablet 1,300 mg  1,300 mg Oral TID Gean Quint, MD   1,300 mg at 08/22/19 1509  . sodium chloride flush (NS) 0.9 % injection 10-40 mL  10-40 mL Intracatheter Q12H Gilles [name] B, MD      . sodium chloride flush (NS) 0.9 % injection 10-40 mL  10-40 mL Intracatheter PRN Sid Falcon, MD         Discharge Medications: Please see discharge summary for a list of discharge medications.  Relevant Imaging Results:  Relevant Lab Results:   Additional Information SSN [id number]  Neysa Hotter [place], [place]

## 2019-08-22 NOTE — Progress Notes (Signed)
Pt stated that there is an odor coming from the HD catheter dressing, there is old serosanguinous drainage around the site. Pigtail flushes easily but no blood return. Staff nurse will contact MD for further instructions.

## 2019-08-22 NOTE — TOC Initial Note (Signed)
Transition of Care [facility]) - Initial/Assessment Note    Patient Details  Name: Penny Perez MRN: [id number] Date of Birth: May 03, 1932  Transition of Care [facility]) CM/SW Contact:    Jacquelynn Cree Phone Number: 08/22/2019, 5:29 PM  Clinical Narrative:                 CSW met with patient and patient's daughter [name] who was also present. Patient lived alone prior to discharge and used a rolling walker for ambulating. CSW spoke with patient and daughter regarding PT recommendation for SNF placement at discharge. Patient expressed understanding and is in agreement. CSW explained insurance authorization process to family and daughter, as well provided information for Medicare.gov SNF ratings. Patient is not vaccinated. No further questions reported at this time. TOC team will continue to follow.   Expected Discharge Plan: Skilled Nursing Facility Barriers to Discharge: Continued Medical Work up, Ship broker   Patient Goals and CMS Choice   CMS Medicare.gov Compare Post Acute Care list provided to:: Patient Choice offered to / list presented to : Patient  Expected Discharge Plan and Services Expected Discharge Plan: [place] In-house Referral: Clinical Social Work     Living arrangements for the past 2 months: [place]                                      Prior Living Arrangements/Services Living arrangements for the past 2 months: Single Family Home Lives with:: Self Patient language and need for interpreter reviewed:: Yes Do you feel safe going back to the place where you live?: Yes      Need for Family Participation in Patient Care: No (Comment) Care giver support system in place?: Yes (comment)   Criminal Activity/Legal Involvement Pertinent to Current Situation/Hospitalization: No - Comment as needed  Activities of Daily Living      Permission Sought/Granted Permission sought to share information with :  Facility Sport and exercise psychologist, Family Supports Permission granted to share information with : Yes, Verbal Permission Granted  Share Information with NAME: [name]  Permission granted to share info w AGENCY: SNFs  Permission granted to share info w Relationship: Daughter  Permission granted to share info w Contact Information: [phone]  Emotional Assessment   Attitude/Demeanor/Rapport: Unable to Assess Affect (typically observed): Pleasant Orientation: : Oriented to Self, Oriented to  Time, Oriented to Situation, Oriented to Place Alcohol / Substance Use: Not Applicable Psych Involvement: No (comment)  Admission diagnosis:  Uremia [N19] Pain [R52] Acute renal failure (ARF) (HCC) [N17.9] AKI (acute kidney injury) ([place]) [N17.9] Closed fracture of one rib of right side, initial encounter [S22.31XA] Hypotension, unspecified hypotension type [I95.9] Patient Active Problem List   Diagnosis Date Noted  . Uremia   . Acute renal failure (ARF) ([place]) 08/19/2019  . Hyperosmolar Hyponatremia 08/19/2019  . Normal anion gap metabolic acidosis 62/22/9798  . Right rib fracture 08/19/2019  . Acute on chronic renal failure w/ Uremia ([place]) 05/05/2019  . Essential hypertension 05/05/2019  . C. difficile colitis 05/05/2019  . Scoliosis of thoracic spine 05/05/2019  . Neck pain 05/05/2019  . Anxiety 05/05/2019  . Muscle spasms of neck 05/05/2019  . Acute renal failure superimposed on stage 3 chronic kidney disease ([place]) 05/04/2019  . Hormone replacement therapy (HRT) 03/22/2014  . HYPERTENSION 12/07/2009  . Acute bronchitis 12/07/2009  . ALLERGIC RHINITIS 12/07/2009  . OSTEOARTHRITIS, MULTIPLE JOINTS 12/07/2009  .  CAROTID BRUIT, LEFT 12/07/2009   PCP:  Chesley Noon, MD Pharmacy:   [facility], [place] - [address] [place] [address] Phone: [phone] Fax: [phone]     Social  Determinants of Health (SDOH) Interventions    Readmission Risk Interventions No flowsheet data found.

## 2019-08-23 LAB — BASIC METABOLIC PANEL
Anion gap: 9 (ref 5–15)
BUN: 28 mg/dL — ABNORMAL HIGH (ref 8–23)
CO2: 23 mmol/L (ref 22–32)
Calcium: 8.2 mg/dL — ABNORMAL LOW (ref 8.9–10.3)
Chloride: 96 mmol/L — ABNORMAL LOW (ref 98–111)
Creatinine, Ser: 1.38 mg/dL — ABNORMAL HIGH (ref 0.44–1.00)
GFR calc Af Amer: 41 mL/min — ABNORMAL LOW (ref >60)
GFR calc non Af Amer: 35 mL/min — ABNORMAL LOW (ref >60)
Glucose, Bld: 91 mg/dL (ref 70–99)
Potassium: 3.7 mmol/L (ref 3.5–5.1)
Sodium: 128 mmol/L — ABNORMAL LOW (ref 135–145)

## 2019-08-23 LAB — GLUCOSE, CAPILLARY
Glucose-Capillary: 99 mg/dL (ref 70–99)
Glucose-Capillary: 94 mg/dL (ref 70–99)
Glucose-Capillary: 92 mg/dL (ref 70–99)
Glucose-Capillary: 90 mg/dL (ref 70–99)

## 2019-08-23 LAB — IRON AND TIBC
Iron: 26 ug/dL — ABNORMAL LOW (ref 28–170)
Saturation Ratios: 9 % — ABNORMAL LOW (ref 10.4–31.8)
TIBC: 288 ug/dL (ref 250–450)
UIBC: 262 ug/dL

## 2019-08-23 LAB — CBC
HCT: 25.9 % — ABNORMAL LOW (ref 36.0–46.0)
Hemoglobin: 8.6 g/dL — ABNORMAL LOW (ref 12.0–15.0)
MCH: 27.7 pg (ref 26.0–34.0)
MCHC: 33.2 g/dL (ref 30.0–36.0)
MCV: 83.3 fL (ref 80.0–100.0)
Platelets: 184 10*3/uL (ref 150–400)
RBC: 3.11 MIL/uL — ABNORMAL LOW (ref 3.87–5.11)
RDW: 15.3 % (ref 11.5–15.5)
WBC: 5.4 10*3/uL (ref 4.0–10.5)
nRBC: 0 % (ref 0.0–0.2)

## 2019-08-23 LAB — FERRITIN: Ferritin: 45 ng/mL (ref 11–307)

## 2019-08-23 LAB — HEPATITIS B SURFACE ANTIGEN: Hepatitis B Surface Ag: NONREACTIVE

## 2019-08-23 MED ORDER — CALCIUM CARBONATE ANTACID 500 MG PO CHEW
1.0000 | CHEWABLE_TABLET | Freq: Three times a day (TID) | ORAL | Status: DC | PRN
  Filled 2019-08-23: qty 1

## 2019-08-23 MED ORDER — OXYCODONE HCL 5 MG PO TABS
5.0000 mg | ORAL_TABLET | Freq: Three times a day (TID) | ORAL | Status: DC | PRN
  Administered 2019-08-23 – 2019-08-24 (×3): 5 mg via ORAL
  Filled 2019-08-23 (×3): qty 1

## 2019-08-23 MED ORDER — FERROUS SULFATE 325 (65 FE) MG PO TABS
325.0000 mg | ORAL_TABLET | Freq: Two times a day (BID) | ORAL | Status: DC
  Filled 2019-08-23 (×2): qty 1

## 2019-08-23 NOTE — Progress Notes (Addendum)
Subjective:   Hospital day: 4  Overnight event: No  Patient is seen at bedside today. She appears alert, pleasant and in no acute distress. Patient states that she feels much better however her appetite was not good due to food quality. She states that she has been urinating multiples times a day and has good urine output. Patient complains of pain of her back which she was taking Percocet 10 -325 TID at home for years. She also complains of swelling of her hands which her ring feels tighter.  Objective:  Vital signs in last 24 hours: Vitals:   08/22/19 0920 08/22/19 1627 08/22/19 2034 08/23/19 0446  BP:  (!) 150/74 (!) 148/90 (!) 144/84  Pulse:  75 79 81  Resp:  18 18 18   Temp:  99.2 F (37.3 C) 98.8 F (37.1 C) 98.8 F (37.1 C)  TempSrc:  Oral Oral Oral  SpO2: 97% 97%  96%    Physical Exam  Physical Exam Constitutional:      General: She is not in acute distress.    Appearance: Normal appearance. She is not toxic-appearing.  HENT:     Head: Normocephalic.  Eyes:     General: No scleral icterus.    Conjunctiva/sclera: Conjunctivae normal.  Cardiovascular:     Rate and Rhythm: Normal rate and regular rhythm.  Pulmonary:     Effort: Pulmonary effort is normal. No respiratory distress.     Breath sounds: Normal breath sounds. No wheezing or rales.  Abdominal:     General: Bowel sounds are normal. There is no distension.     Tenderness: There is no abdominal tenderness.  Musculoskeletal:        General: Normal range of motion.     Cervical back: Normal range of motion.     Right lower leg: No edema.     Left lower leg: No edema.  Skin:    General: Skin is warm.     Coloration: Skin is not jaundiced.  Neurological:     Mental Status: She is alert.  Psychiatric:        Mood and Affect: Mood normal.        Behavior: Behavior normal.     Assessment/Plan: [name] is an 86 year old woman with PMHx of HTN, hypothyroidism, fibromyalgia, OA, GERD, DDD who  presented to the ED with fatigue, found to have AKI on CKD andhyperosmolarhyponatremia.Patient appears clinically better.   Principal Problem:   Acute on chronic renal failure w/ Uremia (HCC) Active Problems:   Essential hypertension   Anxiety   Hyperosmolar Hyponatremia   Normal anion gap metabolic acidosis   Right rib fracture   Uremia  Acute kidney injury on CKD Urine output recorded last 24 h was 100cc but patient reports good urine output. Will follow closely her urine output. Bun 28 and Cr back to basline at 1.38. Given significantly improved Cr and BUN as well as her clinical picture, patient likely will not need HD today. Await Nephrology recommendation.  - Hold her Lasix and HCTZ - Appreciate nephrology recommendations - BMP daily - Strict I/O - Daily weight - PT recommended SNF, await placement.    Normocytic Anemia Hgb drop to 8.6 today. Her hgb has been running low at 8-10 in the past year. Likely anemia 2/2 chronic kidney disease. - Monitor closely - Pending Iron study    Hypertonic Hyponatremia-stable Current sodium 128. Patient's mental status is back to baseline. Will hold IVF. - BMP daily  Normal anion gap metabolic acidosis-resolved - BMP daily    Hypotension- resolved Cortisol level 19.6. TSH 0.4. Current BP 144/84  - Continue to hold all her HCTZ and Lasix   Right rib fracture Likely secondary to falls a few months ago. Conservative treatment with pain control at the moment.  - Tylenol 325 mg Q6h PRN - Increase Oxycodone to 5 mg TID PRN   Hypothyroidism -TSH 0.42 - Continue levothyroxine 100 mcg every other day   Asthma - Breo ellipta daily - Albuterol PRN   Diet:low salt diet IVF:no DIX:BOERQSX SQ CODE:Full  Prior to Admission Living Arrangement:home Anticipated Discharge Location:SNF Barriers to [place] and SNF placement  Dispo: Anticipated discharge in approximately2-3day(s).   Gaylan Gerold, DO 08/23/2019, 6:49 AM Pager: [phone] After 5pm on weekdays and 1pm on weekends: On Call pager [phone]

## 2019-08-23 NOTE — Progress Notes (Signed)
  Date: 08/23/2019  Patient name: Penny Perez  Medical record number: [id number]  Date of birth: 04-03-1932   This patient's plan of care was discussed with the house staff. Please see Dr. Lamont Snowball note for complete details. I concur with his findings.   Sid Falcon, MD 08/23/2019, 10:45 AM

## 2019-08-23 NOTE — Progress Notes (Signed)
[facility] Progress Note    Assessment/ Plan:   1. Acute kidney Injury on ckd4 with significant azotemia and uremic symptoms -s/p temp hd cath 7/22 -s/p HD#1 7/22 (utilized BFR of 150 with 1.5hr to prevent dialysis dysequilibrium syndrome). Patient reports that she had a rough time with IHD initially. Her symptoms continue to improve from a uremic standpoint.  Anticipate that she will no longer require any renal replacement therapy sessions -Can remove her dialysis catheter -Would recommend readdressing goals of care if further dialysis is required -Advised her to call the office for follow-up appointment (was to be seen by Dr. Moshe Cipro) -Avoid nephrotoxic medications including NSAIDs and iodinated intravenous contrast exposure unless the latter is absolutely indicated.  Preferred narcotic agents for pain control are hydromorphone, fentanyl, and methadone. Morphine should not be used. Avoid Baclofen and avoid oral sodium phosphate and magnesium citrate based laxatives / bowel preps. Continue strict Input and Output monitoring. Will monitor the patient closely with you and intervene or adjust therapy as indicated by changes in clinical status/labs  2.  Metabolic acidosis/acidemia likely secondary to AKI, improved 3.  Hyponatremia.  Secondary to thiazide diuretics and low solute intake (urine sodium less than 10 on admit) -Stopped hypotonic fluids.  Changed her diet order to liberalize solute intake 4.  Hypotension, improved 5.  Normocytic anemia, likely related to chronic kidney disease -Check iron panel  Subjective:   Appetite continues to improve.  Dysgeusia is better.  No nausea/vomiting.  Has a hard time eating here because of the quality of the food.   Objective:   BP (!) 141/69 (BP Location: Right Arm)   Pulse 74   Temp 98.7 F (37.1 C) (Oral)   Resp 16   SpO2 97%   Intake/Output Summary (Last 24 hours) at 08/23/2019 1517 Last data filed at 08/23/2019  1300 Gross per 24 hour  Intake 1014.28 ml  Output 100 ml  Net 914.28 ml   Weight change:   Physical Exam: Gen: nad, sitting up in chair CVS: Regular rate, S1-S2 Resp: Clear auscultation bilaterally, normal work of breathing  Abd: Soft, nontender nondistended Ext: Trace edema bilateral lower extremities, osteoarthritic changes in bilateral hands Neuro: Hard of hearing, speech clear and coherent, no asterixis  Imaging: No results found.  Labs: BMET Recent Labs  Lab 08/20/19 1626 08/20/19 1939 08/20/19 2219 08/21/19 0825 08/21/19 1911 08/22/19 0553 08/23/19 0447  NA 132* 127* 134* 133* 131* 130* 128*  K 4.2 3.7 3.3* 3.1* 3.7 3.4* 3.7  CL 104 100 100 102 97* 97* 96*  CO2 15* 15* 21* 22 23 24 23   GLUCOSE 165* 181* 104* 139* 192* 118* 91  BUN 118* 111* 56* 52* 46* 38* 28*  CREATININE 3.67* 3.34* 1.97* 1.96* 1.80* 1.54* 1.38*  CALCIUM 8.9 8.5* 8.6* 8.5* 8.5* 8.4* 8.2*  PHOS  --  4.9*  --   --  2.3*  --   --    CBC Recent Labs  Lab 08/21/19 0825 08/21/19 1911 08/22/19 0553 08/23/19 0447  WBC 5.5 5.6 5.5 5.4  HGB 9.6* 9.7* 9.6* 8.6*  HCT 28.4* 28.4* 28.4* 25.9*  MCV 81.4 82.6 82.6 83.3  PLT 205 217 204 184    Medications:    . Chlorhexidine Gluconate Cloth  6 each Topical Q0600  . feeding supplement  1 Container Oral TID BM  . ferrous sulfate  325 mg Oral BID WC  . fluticasone furoate-vilanterol  1 puff Inhalation Daily  . heparin  5,000 Units Subcutaneous Q8H  .  insulin aspart  0-9 Units Subcutaneous TID WC  . levothyroxine  100 mcg Oral QODAY  . sodium bicarbonate  1,300 mg Oral TID  . sodium chloride flush  10-40 mL Intracatheter Q12H      Gean Quint, MD [facility] 08/23/2019, 3:17 PM

## 2019-08-23 NOTE — Progress Notes (Signed)
Consult was placed to remove Hemodialysis catheter; order reviewed;  Pt was sitting up in the recliner at 1630, and requested to stay up in the chair until after dinner. Will remove [place] after pt is back in the bed.

## 2019-08-23 NOTE — Progress Notes (Signed)
At bedside to d/c [facility] as ordered.  Pt remains up in chair with visitor at bedside.  Norm RN notified.  States he will place IV Team consult once returns to bed.

## 2019-08-24 LAB — BASIC METABOLIC PANEL
Anion gap: 7 (ref 5–15)
BUN: 23 mg/dL (ref 8–23)
CO2: 27 mmol/L (ref 22–32)
Calcium: 8.3 mg/dL — ABNORMAL LOW (ref 8.9–10.3)
Chloride: 95 mmol/L — ABNORMAL LOW (ref 98–111)
Creatinine, Ser: 1.41 mg/dL — ABNORMAL HIGH (ref 0.44–1.00)
GFR calc Af Amer: 40 mL/min — ABNORMAL LOW (ref >60)
GFR calc non Af Amer: 34 mL/min — ABNORMAL LOW (ref >60)
Glucose, Bld: 90 mg/dL (ref 70–99)
Potassium: 3.8 mmol/L (ref 3.5–5.1)
Sodium: 129 mmol/L — ABNORMAL LOW (ref 135–145)

## 2019-08-24 LAB — GLUCOSE, CAPILLARY
Glucose-Capillary: 92 mg/dL (ref 70–99)
Glucose-Capillary: 92 mg/dL (ref 70–99)
Glucose-Capillary: 92 mg/dL (ref 70–99)

## 2019-08-24 LAB — CBC
HCT: 25.2 % — ABNORMAL LOW (ref 36.0–46.0)
Hemoglobin: 8.4 g/dL — ABNORMAL LOW (ref 12.0–15.0)
MCH: 28.2 pg (ref 26.0–34.0)
MCHC: 33.3 g/dL (ref 30.0–36.0)
MCV: 84.6 fL (ref 80.0–100.0)
Platelets: 165 10*3/uL (ref 150–400)
RBC: 2.98 MIL/uL — ABNORMAL LOW (ref 3.87–5.11)
RDW: 15.2 % (ref 11.5–15.5)
WBC: 5.9 10*3/uL (ref 4.0–10.5)
nRBC: 0 % (ref 0.0–0.2)

## 2019-08-24 MED ORDER — POLYETHYLENE GLYCOL 3350 17 G PO PACK
17.0000 g | PACK | Freq: Every day | ORAL | Status: DC
  Administered 2019-08-25 – 2019-08-28 (×4): 17 g via ORAL
  Filled 2019-08-24 (×4): qty 1

## 2019-08-24 MED ORDER — SODIUM CHLORIDE 0.9 % IV SOLN
510.0000 mg | Freq: Once | INTRAVENOUS | Status: AC
  Administered 2019-08-24: 510 mg via INTRAVENOUS
  Filled 2019-08-24: qty 17

## 2019-08-24 MED ORDER — OXYCODONE HCL 5 MG PO TABS
10.0000 mg | ORAL_TABLET | Freq: Three times a day (TID) | ORAL | Status: DC | PRN
  Administered 2019-08-24 – 2019-08-28 (×10): 10 mg via ORAL
  Filled 2019-08-24 (×10): qty 2

## 2019-08-24 MED ORDER — SENNA 8.6 MG PO TABS
1.0000 | ORAL_TABLET | Freq: Every day | ORAL | Status: DC
  Administered 2019-08-24 – 2019-08-28 (×5): 8.6 mg via ORAL
  Filled 2019-08-24 (×5): qty 1

## 2019-08-24 NOTE — Plan of Care (Signed)
  Problem: Education: Goal: Knowledge of disease and its progression will improve Outcome: Progressing   

## 2019-08-24 NOTE — Progress Notes (Signed)
Subjective:   Hospital day: 5  Overnight event: No  Patient examined at bedside. States that she feels well. States she has been urinating well and has good amount of urine. Reports that with PO iron in the past, she has had GI upset and constipation. She declines compression stockings for her lower extremity edema and states that her skin is very fragile.   Objective:  Vital signs in last 24 hours: Vitals:   08/23/19 0932 08/23/19 1650 08/23/19 2139 08/24/19 0445  BP: (!) 141/69 (!) 143/78 (!) 146/70 (!) 140/78  Pulse: 74 77 76 80  Resp: 16 16 18 18   Temp: 98.7 F (37.1 C) 98.4 F (36.9 C) 98.1 F (36.7 C) 98 F (36.7 C)  TempSrc: Oral Oral Oral Oral  SpO2: 97% 96% 97% 98%    Physical Exam  Physical Exam Constitutional:      General: She is not in acute distress.    Appearance: Normal appearance. She is not toxic-appearing.  HENT:     Head: Normocephalic.  Eyes:     General: No scleral icterus.    Conjunctiva/sclera: Conjunctivae normal.  Cardiovascular:     Rate and Rhythm: Normal rate and regular rhythm.     Heart sounds: Murmur (2/6 systolic murmur at upper sternal border) heard.   Pulmonary:     Effort: Pulmonary effort is normal. No respiratory distress.  Abdominal:     General: Bowel sounds are normal.     Palpations: Abdomen is soft.     Tenderness: There is no abdominal tenderness.  Musculoskeletal:     Right lower leg: Edema (+1 ) present.     Left lower leg: Edema (+1) present.  Skin:    General: Skin is warm.     Coloration: Skin is not jaundiced.  Neurological:     Mental Status: She is alert.  Psychiatric:        Mood and Affect: Mood normal.        Behavior: Behavior normal.     Assessment/Plan: Ms. Penny Perez is an 86 year old woman with PMHx of HTN, hypothyroidism, fibromyalgia, OA, GERD, DDD who presented to the ED with fatigue, found to have AKI on CKD andhyperosmolarhyponatremia.Patient appears clinically better and no longer  require dialysis.    Principal Problem:   Acute on chronic renal failure w/ Uremia (HCC) Active Problems:   Essential hypertension   Anxiety   Hyperosmolar Hyponatremia   Normal anion gap metabolic acidosis   Right rib fracture   Uremia  Acute kidney injury on CKD Her kidney function is at baseline. BUN 23, creatine 1.41. Given significantly improved Cr and BUN as well as her clinical picture, patient no longer require HD. Her dialysis cath was removed. Patient has worsening LE edema and decline compression stocking. Consider starting a low dose of Lasix PO when her renal function is stable. - Continue to hold her home HTN ARB and HCTZ - Continue to hold Lasix. - BMPdaily - Strict I/O - Daily weight - PT recommended SNF, await placement.    Normocytic Anemia Hgb drop to 8.4 today. Her hgb has been running low at 8-10 in the past year. Iron study reveals ferritin 45, iron sat 9%, consistent with IDA.  - Switch to IV Feraheme due to inability to tolerate PO ferrous sulfate.  - CBC daily.    Hypertonic Hyponatremia-stable Current sodium 129. Patient's mental status is back to baseline.  - BMP daily  - Switch from low salt diet to regular diet  Normal anion gap metabolic acidosis-resolved - BMP daily    Hypotension- resolved Cortisol level 19.6. TSH 0.4. Current BP 144/84  - Continue to hold all her HCTZ and Lasix   Right rib fracture Likely secondary to falls a few months ago. Conservative treatment with pain control at the moment.  - Tylenol 325 mg Q6h PRN - Increase Oxycodone to 5 mg TID PRN   Hypothyroidism -TSH 0.42 - Continue levothyroxine 100 mcg every other day   Asthma - Breo ellipta daily - Albuterol PRN   Diet:regular IVF:no DGN:PHQNETU SQ CODE:Full  Prior to Admission Living Arrangement:home Anticipated Discharge Location:SNF Barriers to Discharge: SNF placement  Dispo: Anticipated discharge in  approximately2-3day(s).  Penny Gerold, DO 08/24/2019, 7:26 AM Pager: [phone] After 5pm on weekdays and 1pm on weekends: On Call pager [phone]

## 2019-08-24 NOTE — Progress Notes (Signed)
  [facility] Progress Note    Assessment/ Plan:   1. Resolved acute kidney Injury on ckd4 with significant azotemia and uremic symptoms -s/p temp hd cath 7/22 -s/p HD#1 7/22 (utilized BFR of 150 with 1.5hr to prevent dialysis dysequilibrium syndrome).  - HD cath out - ok for f/u at [facility], will make arrangements 2.  Metabolic acidosis/acidemia likely secondary to AKI, resolved 3.  Hyponatremia.  Secondary to thiazide diuretics and low solute intake (urine sodium less than 10 on admit). Stable.  ASx -Stopped hypotonic fluids.  Changed her diet order to liberalize solute intake 4.  Hypotension, improved 5.  Normocytic anemia, likely related to chronic kidney disease  Will sign off for now.  Please call with any questions or concerns.  Pt does need follow up with nephrology and I will make arrangements at our office   Subjective:   No c/o this AM, tol PO SCr at baseline UOP not quantified   Objective:   BP (!) 132/68 (BP Location: Right Arm)   Pulse 79   Temp 98.7 F (37.1 C) (Oral)   Resp 15   SpO2 94%   Intake/Output Summary (Last 24 hours) at 08/24/2019 1227 Last data filed at 08/24/2019 7628 Gross per 24 hour  Intake 338 ml  Output 0 ml  Net 338 ml   Weight change:   Physical Exam: Gen: nad, sitting up  CVS: Regular rate, S1-S2 Resp: Clear auscultation bilaterally, normal work of breathing  Abd: Soft, nontender nondistended Ext: Trace edema bilateral lower extremities, osteoarthritic changes in bilateral hands Neuro: Hard of hearing, speech clear and coherent, no asterixis  Imaging: No results found.  Labs: BMET Recent Labs  Lab 08/20/19 1939 08/20/19 2219 08/21/19 0825 08/21/19 1911 08/22/19 0553 08/23/19 0447 08/24/19 0237  NA 127* 134* 133* 131* 130* 128* 129*  K 3.7 3.3* 3.1* 3.7 3.4* 3.7 3.8  CL 100 100 102 97* 97* 96* 95*  CO2 15* 21* 22 23 24 23 27   GLUCOSE 181* 104* 139* 192* 118* 91 90  BUN 111* 56* 52* 46* 38* 28* 23   CREATININE 3.34* 1.97* 1.96* 1.80* 1.54* 1.38* 1.41*  CALCIUM 8.5* 8.6* 8.5* 8.5* 8.4* 8.2* 8.3*  PHOS 4.9*  --   --  2.3*  --   --   --    CBC Recent Labs  Lab 08/21/19 1911 08/22/19 0553 08/23/19 0447 08/24/19 0237  WBC 5.6 5.5 5.4 5.9  HGB 9.7* 9.6* 8.6* 8.4*  HCT 28.4* 28.4* 25.9* 25.2*  MCV 82.6 82.6 83.3 84.6  PLT 217 204 184 165    Medications:    . Chlorhexidine Gluconate Cloth  6 each Topical Q0600  . feeding supplement  1 Container Oral TID BM  . fluticasone furoate-vilanterol  1 puff Inhalation Daily  . heparin  5,000 Units Subcutaneous Q8H  . insulin aspart  0-9 Units Subcutaneous TID WC  . levothyroxine  100 mcg Oral QODAY  . sodium bicarbonate  1,300 mg Oral TID      [facility] 08/24/2019, 12:27 PM

## 2019-08-25 LAB — CBC
HCT: 26.2 % — ABNORMAL LOW (ref 36.0–46.0)
Hemoglobin: 8.4 g/dL — ABNORMAL LOW (ref 12.0–15.0)
MCH: 27.8 pg (ref 26.0–34.0)
MCHC: 32.1 g/dL (ref 30.0–36.0)
MCV: 86.8 fL (ref 80.0–100.0)
Platelets: 177 10*3/uL (ref 150–400)
RBC: 3.02 MIL/uL — ABNORMAL LOW (ref 3.87–5.11)
RDW: 15.5 % (ref 11.5–15.5)
WBC: 5.5 10*3/uL (ref 4.0–10.5)
nRBC: 0 % (ref 0.0–0.2)

## 2019-08-25 LAB — BASIC METABOLIC PANEL
Anion gap: 8 (ref 5–15)
BUN: 19 mg/dL (ref 8–23)
CO2: 26 mmol/L (ref 22–32)
Calcium: 8.5 mg/dL — ABNORMAL LOW (ref 8.9–10.3)
Chloride: 97 mmol/L — ABNORMAL LOW (ref 98–111)
Creatinine, Ser: 1.41 mg/dL — ABNORMAL HIGH (ref 0.44–1.00)
GFR calc Af Amer: 40 mL/min — ABNORMAL LOW (ref >60)
GFR calc non Af Amer: 34 mL/min — ABNORMAL LOW (ref >60)
Glucose, Bld: 87 mg/dL (ref 70–99)
Potassium: 3.9 mmol/L (ref 3.5–5.1)
Sodium: 131 mmol/L — ABNORMAL LOW (ref 135–145)

## 2019-08-25 MED ORDER — LEVOTHYROXINE SODIUM 100 MCG PO TABS
100.0000 ug | ORAL_TABLET | ORAL | Status: DC
  Administered 2019-08-25 – 2019-08-27 (×2): 100 ug via ORAL
  Filled 2019-08-25 (×2): qty 1

## 2019-08-25 MED ORDER — LEVOTHYROXINE SODIUM 112 MCG PO TABS
112.0000 ug | ORAL_TABLET | ORAL | Status: DC
  Administered 2019-08-26: 112 ug via ORAL
  Filled 2019-08-25: qty 1

## 2019-08-25 MED ORDER — FUROSEMIDE 20 MG PO TABS
20.0000 mg | ORAL_TABLET | Freq: Once | ORAL | Status: AC
  Administered 2019-08-25: 20 mg via ORAL
  Filled 2019-08-25: qty 1

## 2019-08-25 MED ORDER — LEVOTHYROXINE SODIUM 112 MCG PO TABS: 112.0000 ug | ORAL_TABLET | Freq: Every day | ORAL | Status: DC

## 2019-08-25 NOTE — Progress Notes (Signed)
Patient refused CBG. 

## 2019-08-25 NOTE — Progress Notes (Signed)
Physical Therapy Treatment Patient Details Name: Penny Perez MRN: [id number] DOB: 1932/06/30 Today's Date: 08/25/2019    History of Present Illness Pt is an 86 year old woman presenting to the hospital with CRF. Pt has a PMHx of Essential HTN, hypothyroidism, fibromyalgia, OA, GERD, DDD who presented to the ED with fatigue, found to have AKI on CKD and hyperosmolar hyponatremia. Recent hx of R Rib fx 6-9. Pt is s/p R IJ HD catheter placement and dialysis initiated during admission    PT Comments    Pt was seen for practice transferring with min guard needed, gait with RW for two trips and cues for direction and safety.  Pt declined to do ex on LE's with manual resistance, but has noted skin bruising from previous manual pressure on legs.  Pt was handed off to OT and noted that her balance requires only min guard at sink.  Recover therapy goals of strengthening next session with standing strengthening ex's.    Follow Up Recommendations  SNF     Equipment Recommendations  None recommended by PT    Recommendations for Other Services       Precautions / Restrictions Precautions Precautions: Fall Precaution Comments: has painful and sensitive skin on LE;s Restrictions Weight Bearing Restrictions: No    Mobility  Bed Mobility               General bed mobility comments: up in chair  Transfers Overall transfer level: Needs assistance Equipment used: Rolling walker (2 wheeled) Transfers: Sit to/from Stand Sit to Stand: Min guard            Ambulation/Gait Ambulation/Gait assistance: Min guard Gait Distance (Feet): 60 Feet (30 x 2) Assistive device: Rolling walker (2 wheeled) Gait Pattern/deviations: Step-through pattern;Decreased step length - right;Decreased step length - left;Decreased stride length;Decreased dorsiflexion - right;Decreased dorsiflexion - left Gait velocity: decreased Gait velocity interpretation: <1.31 ft/sec, indicative of household  ambulator General Gait Details: minor cues for direction and postural correction   Stairs             Wheelchair Mobility    Modified Rankin (Stroke Patients Only)       Balance Overall balance assessment: Needs assistance Sitting-balance support: Feet supported Sitting balance-Leahy Scale: Fair     Standing balance support: Bilateral upper extremity supported;During functional activity Standing balance-Leahy Scale: Poor Standing balance comment: requires walker for stability                            Cognition Arousal/Alertness: Awake/alert Behavior During Therapy: WFL for tasks assessed/performed Overall Cognitive Status: Within Functional Limits for tasks assessed                                        Exercises General Exercises - Lower Extremity Ankle Circles/Pumps: AROM;5 reps    General Comments General comments (skin integrity, edema, etc.): declined resisted LE ex's due to skin injury      Pertinent Vitals/Pain Pain Assessment: Faces Faces Pain Scale: Hurts little more Pain Location: legs Pain Descriptors / Indicators: Guarding Pain Intervention(s): Limited activity within patient's tolerance;Monitored during session    Home Living                      Prior Function            PT Goals (current goals can  now be found in the care plan section) Acute Rehab PT Goals Patient Stated Goal: return home Progress towards PT goals: Progressing toward goals    Frequency    Min 2X/week      PT Plan Current plan remains appropriate    Co-evaluation              AM-PAC PT "6 Clicks" Mobility   Outcome Measure  Help needed turning from your back to your side while in a flat bed without using bedrails?: A Little Help needed moving from lying on your back to sitting on the side of a flat bed without using bedrails?: A Little Help needed moving to and from a bed to a chair (including a wheelchair)?: A  Little Help needed standing up from a chair using your arms (e.g., wheelchair or bedside chair)?: A Little Help needed to walk in hospital room?: A Little Help needed climbing 3-5 steps with a railing? : A Lot 6 Click Score: 17    End of Session Equipment Utilized During Treatment: Gait belt Activity Tolerance: Patient limited by fatigue Patient left: in chair;with call bell/phone within reach Nurse Communication: Mobility status PT Visit Diagnosis: Unsteadiness on feet (R26.81);Difficulty in walking, not elsewhere classified (R26.2)     Time: 2505-3976 PT Time Calculation (min) (ACUTE ONLY): 27 min  Charges:  $Gait Training: 8-22 mins $Therapeutic Activity: 8-22 mins                 Ramond Dial 08/25/2019, 10:42 PM  Mee Hives, PT MS Acute Rehab Dept. Number: [place] and [place]

## 2019-08-25 NOTE — Progress Notes (Signed)
Patient refused blood sugar check, states she is not diabetic; Dr. Alfonse Spruce aware.

## 2019-08-25 NOTE — Progress Notes (Signed)
Pt refused CBG.

## 2019-08-25 NOTE — TOC Progression Note (Signed)
Transition of Care [facility]) - Progression Note    Patient Details  Name: Penny Perez MRN: [id number] Date of Birth: [birth year]  Transition of [facility]) CM/SW Contact  [name], LCSW Phone Number: 08/25/2019, 5:17 PM  Clinical Narrative:  CSW communicated with daughter [name] ([phone]) regarding her mother discharge disposition. Daughter is in agreement with SNF and was provided with facility responses. Her preferences are, in order-[facility], [facility] and [facility], and information was requested re: visitation, private rooms and TV's in the rooms and this information was provided. CSW was informed by [facility] that they may have a bed tomorrow and daughter requesting that CSW check in with this facility tomorrow to determine bed availability.     Expected Discharge Plan: [place] Barriers to Discharge: Continued Medical Work up, Ship broker  Expected Discharge Plan and Services Expected Discharge Plan: [place] In-house Referral: Clinical Social Work     Living arrangements for the past 2 months: Single Family Home                                     Social Determinants of Health (SDOH) Interventions  No SDOH interventions requested or needed at discharge   Readmission Risk Interventions No flowsheet data found.

## 2019-08-25 NOTE — Progress Notes (Addendum)
Subjective:   Hospital day: 6  Overnight event: No  Patient states that her head feels like her head "is going to fly off my body." However, she denies pain. Did not resolve with tylenol. Suspects that it is due to BP, but her BP has been 130s-140s / 70s-80s over the past day. She states that she feels somewhat SOB when lying flat. Notes swelling in feet. Also complains of neck pain.   Objective:  Vital signs in last 24 hours: Vitals:   08/24/19 0933 08/24/19 1732 08/24/19 2047 08/25/19 0451  BP: (!) 132/68 (!) 130/71 (!) 147/82 (!) 134/81  Pulse: 79 95 87 71  Resp: 15 18 15    Temp: 98.7 F (37.1 C) 99.1 F (37.3 C) 98.9 F (37.2 C) 98.5 F (36.9 C)  TempSrc: Oral Oral Oral Oral  SpO2: 94% 96% 96% 93%    Physical Exam  Physical Exam Constitutional:      General: She is not in acute distress.    Appearance: She is not toxic-appearing.  HENT:     Head: Normocephalic.  Eyes:     General: No scleral icterus.    Conjunctiva/sclera: Conjunctivae normal.  Cardiovascular:     Rate and Rhythm: Normal rate and regular rhythm.     Heart sounds: Murmur (3/6 systolic murmur heard best at upper sternal border) heard.   Pulmonary:     Effort: Pulmonary effort is normal. No respiratory distress.     Comments: Very mild crackles at left lung base Abdominal:     General: Bowel sounds are normal. There is no distension.     Palpations: Abdomen is soft.     Tenderness: There is no abdominal tenderness.  Musculoskeletal:     Cervical back: Normal range of motion.     Right lower leg: Edema (+1) present.     Left lower leg: Edema (+1) present.  Skin:    General: Skin is warm.     Findings: Bruising (Bruising at bilateral lower legs) present.  Neurological:     Mental Status: She is alert.  Psychiatric:        Mood and Affect: Mood normal.        Thought Content: Thought content normal.     Assessment/Plan: Penny Perez is an 86 year old woman with PMHx of HTN,  hypothyroidism, fibromyalgia, OA, GERD, DDD who presented to the ED with fatigue, found to have AKI on CKD andhyperosmolarhyponatremia.Patient appears clinically better and no longer require dialysis.  Pending SNF placement  Principal Problem:   Acute on chronic renal failure w/ Uremia (HCC) Active Problems:   Essential hypertension   Anxiety   Hyperosmolar Hyponatremia   Normal anion gap metabolic acidosis   Right rib fracture   Uremia  Acute kidney injury on CKD Her kidney function is at baseline. Givensignificantly improved Cr and BUN as well as her clinical picture, patient no longer require HD. Her dialysis cath was removed. Patient has worsening LE edema and decline compression stocking.  Her oxygen saturation was 93% on room air and was trending down.  She does complain of shortness of breath when laying down.  Mild crackles noted on the left lung base.  We will start low-dose Lasix and continue to monitor kidney function. - Continue to hold her home HTN ARB and HCTZ -Start Lasix 20 mg p.o. daily - BMPdaily - Strict I/O - Daily weight -Patient is stable for discharge.  PTrecommended SNF, await placement.   Normocytic Anemia Hgb drop to 8.4  today. Her hgb has been running low at 8-10 in the past year. Iron study reveals ferritin 45, iron sat 9%, consistent with IDA.  -Feraheme was given yesterday - CBC daily.    Hypertonic Hyponatremia-stable Current sodium 131. Patient's mental status is back to baseline.  - BMP daily - Switch from low salt diet to regular diet   Normal anion gap metabolic acidosis-resolved - BMPdaily   Hypotension- resolved Cortisol level 19.6. TSH 0.4.  -Continue to hold all herHCTZ and ARB   Right rib fracture Likely secondary to falls a few months ago. Conservative treatment with pain control at the moment.  - Tylenol 325 mg Q6h PRN -IncreaseOxycodoneto5 mg TID PRN   Hypothyroidism -TSH 0.42 -Change to  levothyroxine 112 mcg and 100 mcg alternating daily   Asthma - Breo ellipta daily - Albuterol PRN   Diet:regular IVF:no WSF:KCLEXNT SQ CODE:Full  Prior to Admission Living Arrangement:home Anticipated Discharge Location:SNF Barriers to Discharge: SNF placement Dispo: Anticipated discharge in approximately2-3day(s).  Penny Gerold, DO 08/25/2019, 6:47 AM Pager: [phone] After 5pm on weekdays and 1pm on weekends: On Call pager [phone]

## 2019-08-25 NOTE — Progress Notes (Signed)
Occupational Therapy Evaluation Patient Details Name: Penny Perez MRN: [id number] DOB: [birth year] Today's Date: 08/25/2019    History of Present Illness Pt is an 86 year old woman presenting to the hospital with CRF. Pt has a PMHx of Essential HTN, hypothyroidism, fibromyalgia, OA, GERD, DDD who presented to the ED with fatigue, found to have AKI on CKD and hyperosmolar hyponatremia. Recent hx of R Rib fx 6-9. Pt is s/p R IJ HD catheter placement and dialysis initiated during admission   Clinical Impression   PTA patient is independent and lives alone.  Daughter is nearby and helps with home management and cooking.  Patient performing well today, able to stand and walk across room with RW and min guard.  Completed standing grooming at sink with supervision.  Requires min assist at this time with LB ADLs.  Daughter reports she will be with her full time short term at discharge.  Would benefit from further therapy to improve balance, strength, and activity tolerance for increased independence and safety.      Follow Up Recommendations  Home health OT;Supervision - Intermittent    Equipment Recommendations  3 in 1 bedside commode    Recommendations for Other Services       Precautions / Restrictions Precautions Precautions: Fall Precaution Comments: pt has had 2 recent falls within the home, unsure of what caused the fall      Mobility Bed Mobility               General bed mobility comments: pt in reclining chair when entered the room  Transfers Overall transfer level: Needs assistance Equipment used: Rolling walker (2 wheeled) Transfers: Sit to/from Stand Sit to Stand: Min guard              Balance Overall balance assessment: Needs assistance Sitting-balance support: No upper extremity supported;Feet unsupported Sitting balance-Leahy Scale: Fair     Standing balance support: Bilateral upper extremity supported;During functional activity Standing  balance-Leahy Scale: Poor                             ADL either performed or assessed with clinical judgement   ADL Overall ADL's : Needs assistance/impaired Eating/Feeding: Independent;Sitting   Grooming: Wash/dry face;Wash/dry hands;Supervision/safety;Standing   Upper Body Bathing: Set up;Sitting   Lower Body Bathing: Minimal assistance;Sit to/from stand   Upper Body Dressing : Set up;Sitting   Lower Body Dressing: Minimal assistance;Sit to/from stand   Toilet Transfer: Designer, fashion/clothing and Hygiene: Supervision/safety;Sitting/lateral lean       Functional mobility during ADLs: Banker      Pertinent Vitals/Pain Pain Assessment: Faces Faces Pain Scale: Hurts little more Pain Location: "everywhere" Pain Descriptors / Indicators: Grimacing Pain Intervention(s): Monitored during session     Hand Dominance Right   Extremity/Trunk Assessment Upper Extremity Assessment Upper Extremity Assessment: Generalized weakness;RUE deficits/detail;LUE deficits/detail RUE Deficits / Details: Noted arthritic deformities in fingers LUE Deficits / Details: Noted arthritic deformities in fingers   Lower Extremity Assessment Lower Extremity Assessment: Defer to PT evaluation   Cervical / Trunk Assessment Cervical / Trunk Assessment: Kyphotic   Communication Communication Communication: HOH   Cognition Arousal/Alertness: Awake/alert Behavior During Therapy: WFL for tasks assessed/performed Overall Cognitive Status: Within Functional Limits for tasks assessed  General Comments  Skin is very thin/fragile, had tear on LE from previous handing of leg    Exercises     Shoulder Instructions      Home Living Family/patient expects to be discharged to:: Private residence Living Arrangements: Alone Available Help at  Discharge: Family Type of Home: House Home Access: Stairs to enter Technical brewer of Steps: 5 Entrance Stairs-Rails: Left Home Layout: Multi-level Alternate Level Stairs-Number of Steps: 16 Alternate Level Stairs-Rails: None Bathroom Shower/Tub: Walk-in shower;Tub/shower unit   Bathroom Toilet: Handicapped height Bathroom Accessibility: Yes   Home Equipment: Walker - 2 wheels   Additional Comments: pt says she has a 6,000 sqft home that is 3 floors. She mainly lives on the first floor but needs to get to other levels. Pt has master bedroom and bath on first floor with walk-in shower but prefers to use the shower/tub on the second floor. Has a shower seat in the walk in shower but no railings       Prior Functioning/Environment Level of Independence: Independent                 OT Problem List: Decreased strength;Decreased activity tolerance;Impaired balance (sitting and/or standing);Pain      OT Treatment/Interventions: Self-care/ADL training;Therapeutic exercise;Energy conservation;DME and/or AE instruction;Therapeutic activities;Patient/family education;Balance training    OT Goals(Current goals can be found in the care plan section) Acute Rehab OT Goals Patient Stated Goal: return home OT Goal Formulation: With patient Time For Goal Achievement: 09/08/19 Potential to Achieve Goals: Good  OT Frequency: Min 2X/week   Barriers to D/C:            Co-evaluation              AM-PAC OT "6 Clicks" Daily Activity     Outcome Measure Help from another person eating meals?: None Help from another person taking care of personal grooming?: A Little Help from another person toileting, which includes using toliet, bedpan, or urinal?: A Little Help from another person bathing (including washing, rinsing, drying)?: A Little Help from another person to put on and taking off regular upper body clothing?: A Little Help from another person to put on and taking off  regular lower body clothing?: A Little 6 Click Score: 19   End of Session Equipment Utilized During Treatment: Rolling walker Nurse Communication: Mobility status  Activity Tolerance: Patient tolerated treatment well Patient left: in chair;with call bell/phone within reach;with family/visitor present  OT Visit Diagnosis: Unsteadiness on feet (R26.81);Muscle weakness (generalized) (M62.81);History of falling (Z91.81);Pain Pain - part of body:  (general)                Time: 2119-4174 OT Time Calculation (min): 14 min Charges:  OT General Charges $OT Visit: 1 Visit OT Evaluation $OT Eval Moderate Complexity: 1 [address], OTR/L   Phylliss Bob 08/25/2019, 3:00 PM

## 2019-08-26 LAB — CBC
HCT: 31.1 % — ABNORMAL LOW (ref 36.0–46.0)
Hemoglobin: 10 g/dL — ABNORMAL LOW (ref 12.0–15.0)
MCH: 28.1 pg (ref 26.0–34.0)
MCHC: 32.2 g/dL (ref 30.0–36.0)
MCV: 87.4 fL (ref 80.0–100.0)
Platelets: 222 10*3/uL (ref 150–400)
RBC: 3.56 MIL/uL — ABNORMAL LOW (ref 3.87–5.11)
RDW: 15.6 % — ABNORMAL HIGH (ref 11.5–15.5)
WBC: 6.3 10*3/uL (ref 4.0–10.5)
nRBC: 0 % (ref 0.0–0.2)

## 2019-08-26 LAB — BASIC METABOLIC PANEL
Anion gap: 12 (ref 5–15)
BUN: 16 mg/dL (ref 8–23)
CO2: 25 mmol/L (ref 22–32)
Calcium: 9.4 mg/dL (ref 8.9–10.3)
Chloride: 94 mmol/L — ABNORMAL LOW (ref 98–111)
Creatinine, Ser: 1.46 mg/dL — ABNORMAL HIGH (ref 0.44–1.00)
GFR calc Af Amer: 38 mL/min — ABNORMAL LOW (ref >60)
GFR calc non Af Amer: 33 mL/min — ABNORMAL LOW (ref >60)
Glucose, Bld: 95 mg/dL (ref 70–99)
Potassium: 3.8 mmol/L (ref 3.5–5.1)
Sodium: 131 mmol/L — ABNORMAL LOW (ref 135–145)

## 2019-08-26 MED ORDER — FUROSEMIDE 20 MG PO TABS
20.0000 mg | ORAL_TABLET | Freq: Every day | ORAL | Status: DC | PRN
  Filled 2019-08-26: qty 1

## 2019-08-26 NOTE — Progress Notes (Signed)
Physical Therapy Treatment Patient Details Name: Penny Perez MRN: [id number] DOB: 1932-03-26 Today's Date: 08/26/2019    History of Present Illness Pt is an 86 year old woman presenting to the hospital with CRF. Pt has a PMHx of Essential HTN, hypothyroidism, fibromyalgia, OA, GERD, DDD who presented to the ED with fatigue, found to have AKI on CKD and hyperosmolar hyponatremia. Recent hx of R Rib fx 6-9. Pt is s/p R IJ HD catheter placement and dialysis initiated during admission    PT Comments    Continuing work on functional mobility and activity tolerance;  Able to incr amb distance, and she seemed pleased to be able to walk further; indicated walking helps decr stiffness; Indicated she looks forward to post-acute rehab to help get her home -- she's a bit apprehensive about visitation policy at SNF, though  Follow Up Recommendations  SNF     Equipment Recommendations  None recommended by PT    Recommendations for Other Services       Precautions / Restrictions Precautions Precautions: Fall Precaution Comments: has painful and sensitive skin on LE;s    Mobility  Bed Mobility Overal bed mobility: Needs Assistance Bed Mobility: Sit to Supine       Sit to supine: Mod assist   General bed mobility comments: Heavy mod assist to help LEs into bed  Transfers Overall transfer level: Needs assistance Equipment used: Rolling walker (2 wheeled) Transfers: Sit to/from Stand Sit to Stand: Min guard         General transfer comment: Stood from [facility] with good hand placement to push up on armrests; slow rise  Ambulation/Gait Ambulation/Gait assistance: Min guard Gait Distance (Feet): 75 Feet Assistive device: Rolling walker (2 wheeled) Gait Pattern/deviations: Step-through pattern;Decreased step length - right;Decreased step length - left;Decreased stride length;Decreased dorsiflexion - right;Decreased dorsiflexion - left     General Gait Details: minor cues for  direction and postural correction   Stairs             Wheelchair Mobility    Modified Rankin (Stroke Patients Only)       Balance     Sitting balance-Leahy Scale: Fair       Standing balance-Leahy Scale: Poor Standing balance comment: requires walker for stability                            Cognition Arousal/Alertness: Awake/alert Behavior During Therapy: WFL for tasks assessed/performed Overall Cognitive Status: Within Functional Limits for tasks assessed                                        Exercises      General Comments        Pertinent Vitals/Pain Pain Assessment: Faces Faces Pain Scale: Hurts a little bit Pain Location: legs Pain Descriptors / Indicators: Guarding Pain Intervention(s): Premedicated before session    Home Living                      Prior Function            PT Goals (current goals can now be found in the care plan section) Acute Rehab PT Goals Patient Stated Goal: return home PT Goal Formulation: With patient Time For Goal Achievement: 09/04/19 Potential to Achieve Goals: Fair Progress towards PT goals: Progressing toward goals    Frequency  Min 2X/week      PT Plan Current plan remains appropriate    Co-evaluation              AM-PAC PT "6 Clicks" Mobility   Outcome Measure  Help needed turning from your back to your side while in a flat bed without using bedrails?: A Little Help needed moving from lying on your back to sitting on the side of a flat bed without using bedrails?: A Little Help needed moving to and from a bed to a chair (including a wheelchair)?: A Little Help needed standing up from a chair using your arms (e.g., wheelchair or bedside chair)?: A Little Help needed to walk in hospital room?: A Little Help needed climbing 3-5 steps with a railing? : A Lot 6 Click Score: 17    End of Session Equipment Utilized During Treatment: Gait belt Activity  Tolerance: Patient tolerated treatment well Patient left: in chair;with call bell/phone within reach Nurse Communication: Mobility status PT Visit Diagnosis: Unsteadiness on feet (R26.81);Difficulty in walking, not elsewhere classified (R26.2)     Time: 7583-0746 PT Time Calculation (min) (ACUTE ONLY): 20 min  Charges:  $Gait Training: 8-22 mins                     Roney Marion, [place]  Acute Rehabilitation Services Pager [phone] Office [phone]    Penny Perez 08/26/2019, 2:09 PM

## 2019-08-26 NOTE — TOC Progression Note (Signed)
Transition of Care [facility]) - Progression Note    Patient Details  Name: Penny Perez MRN: [id number] Date of Birth: 04-Sep-1932  [facility] CM/SW Contact  [name], LCSW Phone Number: 08/26/2019, 3:12 PM  Clinical Narrative:  CSW contacted [facility] regarding patient. Talked with admissions director Elyse Hsu and they can take patient, pending insurance authorization.Elyse Hsu was informed that patient is not vaccinated.    Call made to [facility] and they do manage patient. Clinicals faxed to [facility]. Penny Perez, daughter [phone]) contacted and advised that [facility] can take per mother pending insurance authorization.       Expected Discharge Plan: [place] Barriers to Discharge: Continued Medical Work up, Ship broker  Expected Discharge Plan and Services Expected Discharge Plan: [place] In-house Referral: Clinical Social Work     Living arrangements for the past 2 months: Single Family Home                                     Social Determinants of Health (SDOH) Interventions  No SDOH interventions requested or needed at this time   Readmission Risk Interventions No flowsheet data found.

## 2019-08-26 NOTE — Progress Notes (Signed)
Subjective:   Hospital day: 7  Overnight event: No  Patient is sitting in recliner chair during examination.  She is appears pleasant and in no acute distress.  Patient denies new complaints this morning. States that she is urinating well. Still having orthopnea and some leg swelling, but breathing well sitting up.   Objective:  Vital signs in last 24 hours: Vitals:   08/25/19 0451 08/25/19 0917 08/25/19 2100 08/26/19 0435  BP: (!) 134/81 (!) 132/74 (!) 131/67 127/73  Pulse: 71 75 74 73  Resp:  16 18 14   Temp: 98.5 F (36.9 C) 98.2 F (36.8 C) 98.1 F (36.7 C) 98.5 F (36.9 C)  TempSrc: Oral Oral Oral Oral  SpO2: 93% 98% 96% 94%  Weight:   53.9 kg     Physical Exam  Physical Exam Constitutional:      General: She is not in acute distress.    Appearance: Normal appearance. She is not toxic-appearing.  HENT:     Head: Normocephalic.  Eyes:     General: No scleral icterus.    Conjunctiva/sclera: Conjunctivae normal.  Cardiovascular:     Rate and Rhythm: Normal rate and regular rhythm.  Pulmonary:     Effort: Pulmonary effort is normal. No respiratory distress.     Breath sounds: Normal breath sounds.  Abdominal:     General: There is no distension.     Palpations: Abdomen is soft.     Tenderness: There is no abdominal tenderness.  Musculoskeletal:     Right lower leg: Edema (+1 appear worse than yesterday) present.     Left lower leg: Edema (+1) present.  Skin:    General: Skin is warm.  Neurological:     Mental Status: She is alert.  Psychiatric:        Mood and Affect: Mood normal.        Behavior: Behavior normal.     Assessment/Plan: Ms. Penny Perez is an 86 year old woman with PMHx of HTN, hypothyroidism, fibromyalgia, OA, GERD, DDD who presented to the ED with fatigue, found to have AKI on CKD andhyperosmolarhyponatremia.Patient appears clinically betterand no longer require dialysis.  Still pending SNF placement  Principal Problem:   Acute on  chronic renal failure w/ Uremia (HCC) Active Problems:   Essential hypertension   Anxiety   Hyperosmolar Hyponatremia   Normal anion gap metabolic acidosis   Right rib fracture   Uremia   Acute kidney injury on CKD Her kidney function is at baseline. She is no longer requiring hemodialysis.Patient has worsening LE edema.  However her lungs sound and oxygen saturation is better.  Patient reports good urine output.  We will try a low-dose Lasix p.o. daily as needed.  Continue to monitor renal function -Continue to hold her home HTN ARB andHCTZ -Continue Lasix 20 mg p.o. daily as needed - BMPdaily -Patient is stable for discharge.  PTrecommended SNF, await placement.   Hypertonic Hyponatremia-stable Current sodium 131. Patient's mental status is back to baseline.  - BMP daily - Switch from low salt diet to regular diet   Right rib fracture Likely secondary to falls a few months ago. Conservative treatment with pain control at the moment.  - Tylenol 325 mg Q6h PRN -Oxycodoneto10 mg TID PRN -Bowel regimen with MiraLAX and Senokot   Normocytic Anemia Iron study reveals ferritin 45, iron sat 9%, consistent with IDA.   Current hemoglobin of 10 - CBC daily.   Hypothyroidism -TSH 0.42 -Change to levothyroxine 112 mcg and 100  mcg alternating daily   Asthma - Breo ellipta daily - Albuterol PRN   Diet:regular IVF:no BBJ:[id number] SQ CODE:Full  Prior to Admission Living Arrangement:home Anticipated Discharge Location:SNF Barriers to Discharge: SNF placement Dispo: Anticipated discharge in approximately2-3day(s).  Gaylan Gerold, DO 08/26/2019, 6:30 AM Pager: [phone] After 5pm on weekdays and 1pm on weekends: On Call pager [phone]

## 2019-08-27 ENCOUNTER — Inpatient Hospital Stay (HOSPITAL_COMMUNITY): Payer: Medicare Other

## 2019-08-27 LAB — BASIC METABOLIC PANEL
Anion gap: 7 (ref 5–15)
BUN: 19 mg/dL (ref 8–23)
CO2: 26 mmol/L (ref 22–32)
Calcium: 8.7 mg/dL — ABNORMAL LOW (ref 8.9–10.3)
Chloride: 99 mmol/L (ref 98–111)
Creatinine, Ser: 1.68 mg/dL — ABNORMAL HIGH (ref 0.44–1.00)
GFR calc Af Amer: 32 mL/min — ABNORMAL LOW (ref >60)
GFR calc non Af Amer: 28 mL/min — ABNORMAL LOW (ref >60)
Glucose, Bld: 88 mg/dL (ref 70–99)
Potassium: 4.3 mmol/L (ref 3.5–5.1)
Sodium: 132 mmol/L — ABNORMAL LOW (ref 135–145)

## 2019-08-27 MED ORDER — PANTOPRAZOLE SODIUM 40 MG PO TBEC
40.0000 mg | DELAYED_RELEASE_TABLET | Freq: Every day | ORAL | Status: DC
  Administered 2019-08-27 – 2019-08-28 (×2): 40 mg via ORAL
  Filled 2019-08-27 (×2): qty 1

## 2019-08-27 MED ORDER — FUROSEMIDE 20 MG PO TABS
20.0000 mg | ORAL_TABLET | ORAL | Status: DC
  Administered 2019-08-27: 20 mg via ORAL
  Filled 2019-08-27: qty 1

## 2019-08-27 NOTE — Progress Notes (Signed)
RT instructed patient and family on the use of incentive spirometer and flutter valve. Patient is able to demonstrate back good effort on both devices and reached 825 mL with the IS.

## 2019-08-27 NOTE — Progress Notes (Signed)
Occupational Therapy Treatment Patient Details Name: Penny Perez MRN: [id number] DOB: 20-Mar-1932 Today's Date: 08/27/2019    History of present illness Pt is an 86 year old woman presenting to the hospital with CRF. Pt has a PMHx of Essential HTN, hypothyroidism, fibromyalgia, OA, GERD, DDD who presented to the ED with fatigue, found to have AKI on CKD and hyperosmolar hyponatremia. Recent hx of R Rib fx 6-9. Pt is s/p R IJ HD catheter placement and dialysis initiated during admission   OT comments  Pt received up in recliner and agreeable to participate. Pt reports desire to ambulate to bathroom without AD. Pt overall min guard for short distance mobility with intermittent handheld assist from therapist. Placed BSC over toilet to improve ability to complete sit to stand with Min A required. Pt overall min guard at most for toileting task and grooming tasks standing at sink without AD. Educated pt on UE HEP using yellow theraband as pt has been reporting progressive weakness. Pt able to demonstrate exercises with min verbal cues with good carryover of technique noted. Pt reports daughter unable to assist as she had during April 2021 hospitalization, so DC recommendations updated to SNF to maximize independence with ADLs/IADLs.    Follow Up Recommendations  SNF;Supervision/Assistance - 24 hour    Equipment Recommendations  3 in 1 bedside commode    Recommendations for Other Services      Precautions / Restrictions Precautions Precautions: Fall Precaution Comments: has painful and sensitive skin on LE;s Restrictions Weight Bearing Restrictions: No       Mobility Bed Mobility               General bed mobility comments: up in recliner  Transfers Overall transfer level: Needs assistance Equipment used: None;1 person hand held assist Transfers: Sit to/from Stand;Stand Pivot Transfers Sit to Stand: Min assist Stand pivot transfers: Min guard       General transfer  comment: Min A for power up from [facility] over toilet, min guard for stand pivot with intermittent handheld assist    Balance Overall balance assessment: Needs assistance Sitting-balance support: Feet supported Sitting balance-Leahy Scale: Fair     Standing balance support: No upper extremity supported;During functional activity Standing balance-Leahy Scale: Fair Standing balance comment: fair static standing balance without AD, but requires handheld support of one UE for dynamic standing balance activities                           ADL either performed or assessed with clinical judgement   ADL Overall ADL's : Needs assistance/impaired     Grooming: Supervision/safety;Standing;Oral care;Wash/dry hands Grooming Details (indicate cue type and reason): Supervision standing at sink no LOB without AD while performing oral care and washing hands after toileting task                 Toilet Transfer: Ambulation;BSC;Regular Toilet;Minimal assistance Toilet Transfer Details (indicate cue type and reason): Min guard for ambulation to bathroom with intermittent handheld assist. Placed BSC over toilet with pt pushing up from armrests with Min A Toileting- Clothing Manipulation and Hygiene: Min guard;Sit to/from stand Toileting - Clothing Manipulation Details (indicate cue type and reason): min guard for clothing mgmt and hygiene in standing     Functional mobility during ADLs: Min guard General ADL Comments: Pt reporting she does not need walker to walk to bathroom. Pt with mild balance deficits without use of AD. Pt reports feeling weak and increasing difficulty managing  at home since GI issues in April      Vision   Vision Assessment?: No apparent visual deficits   Perception     Praxis      Cognition Arousal/Alertness: Awake/alert Behavior During Therapy: WFL for tasks assessed/performed Overall Cognitive Status: Within Functional Limits for tasks assessed                                           Exercises Exercises: General Upper Extremity General Exercises - Upper Extremity Shoulder Flexion: Strengthening;Both;10 reps;Seated;Theraband Theraband Level (Shoulder Flexion): Level 1 (Yellow) Shoulder Horizontal ABduction: Both;10 reps;Seated;Theraband Theraband Level (Shoulder Horizontal Abduction): Level 1 (Yellow) Elbow Flexion: Both;10 reps;Seated;Theraband Theraband Level (Elbow Flexion): Level 1 (Yellow) Elbow Extension: Strengthening;Both;10 reps;Seated;Theraband Theraband Level (Elbow Extension): Level 1 (Yellow)   Shoulder Instructions       General Comments Pt reporting desire to elevate B LEs to decrease swelling but reports recliner chair hurts B LEs/thin skin. Placed small chair to prop feet up with linens under B LE and pillow to float heel. RN notified     Pertinent Vitals/ Pain       Pain Assessment: Faces Faces Pain Scale: Hurts a little bit Pain Location: arthritic hands Pain Descriptors / Indicators: Grimacing Pain Intervention(s): Monitored during session  Home Living                                          Prior Functioning/Environment              Frequency  Min 2X/week        Progress Toward Goals  OT Goals(current goals can now be found in the care plan section)  Progress towards OT goals: Progressing toward goals  Acute Rehab OT Goals Patient Stated Goal: be discharged from hospital soon OT Goal Formulation: With patient Time For Goal Achievement: 09/08/19 Potential to Achieve Goals: Good ADL Goals Pt Will Perform Upper Body Bathing: with modified independence;sitting Pt Will Perform Lower Body Bathing: with modified independence;sitting/lateral leans;sit to/from stand Pt Will Transfer to Toilet: with modified independence;ambulating;regular height toilet Pt/caregiver will Perform Home Exercise Program: Increased strength;Both right and left upper extremity;With  theraband;Independently Additional ADL Goal #1: Patient will independently verbalize 3 fall prevention strategies.  Plan Discharge plan needs to be updated    Co-evaluation                 AM-PAC OT "6 Clicks" Daily Activity     Outcome Measure   Help from another person eating meals?: None Help from another person taking care of personal grooming?: A Little Help from another person toileting, which includes using toliet, bedpan, or urinal?: A Little Help from another person bathing (including washing, rinsing, drying)?: A Little Help from another person to put on and taking off regular upper body clothing?: A Little Help from another person to put on and taking off regular lower body clothing?: A Little 6 Click Score: 19    End of Session Equipment Utilized During Treatment: Gait belt  OT Visit Diagnosis: Unsteadiness on feet (R26.81);Muscle weakness (generalized) (M62.81);History of falling (Z91.81);Pain   Activity Tolerance Patient tolerated treatment well   Patient Left in chair;with call bell/phone within reach   Nurse Communication Mobility status        Time: 4270-6237 OT  Time Calculation (min): 29 min  Charges: OT General Charges $OT Visit: 1 Visit OT Treatments $Self Care/Home Management : 8-22 mins $Therapeutic Exercise: 8-22 mins  Layla Maw, OTR/L   Layla Maw 08/27/2019, 1:30 PM

## 2019-08-27 NOTE — Progress Notes (Signed)
Subjective:   Hospital day: 8  Overnight event: no  Patient is sitting on a recliner chair during examination.  She states that she feels well this morning. States that she urinates well, going several times a day but making relatively little urine each time.  She states that her leg swelling has gotten worse, declines compression stockings.  She still complaining of shortness of breath when laying down  Objective:  Vital signs in last 24 hours: Vitals:   08/26/19 0859 08/26/19 1823 08/26/19 2017 08/27/19 0457  BP: (!) 131/70 (!) 122/59 (!) 119/63 (!) 123/59  Pulse: 82 81 86 67  Resp: 16 18 18 14   Temp: 98.8 F (37.1 C) 97.9 F (36.6 C) 98.2 F (36.8 C) 97.9 F (36.6 C)  TempSrc: Oral Oral Oral Oral  SpO2: 94% 98% 94% 94%  Weight:   52.9 kg     Physical Exam  Physical Exam Constitutional:      General: She is not in acute distress.    Appearance: She is not toxic-appearing.  HENT:     Head: Normocephalic.  Eyes:     General: No scleral icterus.    Conjunctiva/sclera: Conjunctivae normal.  Cardiovascular:     Rate and Rhythm: Normal rate and regular rhythm.     Heart sounds: Murmur (3/6 systolic murmur upper sternal border) heard.   Pulmonary:     Effort: Pulmonary effort is normal. No respiratory distress.     Comments: Diminished breath sounds at bilateral lung bases Abdominal:     General: Abdomen is flat. Bowel sounds are normal. There is no distension.     Palpations: Abdomen is soft.     Tenderness: There is no abdominal tenderness.  Musculoskeletal:        General: Tenderness present.     Cervical back: Normal range of motion.     Right lower leg: Edema (+1) present.     Left lower leg: Edema (+1) present.  Skin:    General: Skin is warm.     Coloration: Skin is not jaundiced.  Neurological:     Mental Status: She is alert.  Psychiatric:        Mood and Affect: Mood normal.        Behavior: Behavior normal.     Assessment/Plan: Penny Perez is  an 86 year old woman with PMHx of HTN, hypothyroidism, fibromyalgia, OA, GERD, DDD who presented to the ED with fatigue, found to have AKI on CKD andhyperosmolarhyponatremia.Patient appears clinically betterand no longer require dialysis.   Pending insurance authorization for SNF placement.   Principal Problem:   Acute on chronic renal failure w/ Uremia (HCC) Active Problems:   Essential hypertension   Anxiety   Hyperosmolar Hyponatremia   Normal anion gap metabolic acidosis   Right rib fracture   Uremia  Acute kidney injury on CKD Creatinine elevated to 1.61 today. Patient continued to have worsening LE edema.  Diminished breath sounds auscultated at bilateral lung bases.  Even though patient is having urine output will check bladder scan to make sure she is not retaining any urine. We will try Lasix 20 mg p.o. every other day.  Continue to monitor renal function every day. -Continue to hold her home HTN ARB andHCTZ -Continue Lasix 20 mg p.o. every other day - BMPdaily -Patient is stable for discharge.PTrecommended SNF, await placement.   Atelectasis of left lung base Likely secondary to rib pain and not ambulating much.  Patient O2 still satting well on room air at  96%.  Orders incentive spirometry and flutter valve.  Will encourage patient to use it.   Right rib fracture Likely secondary to falls a few months ago. Conservative treatment with pain control at the moment.  - Tylenol 325 mg Q6h PRN -Oxycodoneto10 mg TID PRN -Bowel regimen with MiraLAX and Senokot   Hypertonic Hyponatremia-stable - BMP daily   Normocytic Anemia Iron study reveals ferritin 45, iron sat 9%, consistent with IDA.Feraheme was given   Hypothyroidism -TSH 0.42 -Continue levothyroxine 112 mcgand 100 mcg alternatingdaily   Asthma - Breo ellipta daily - Albuterol PRN   Diet:regular IVF:no JZP:HXTAVWP SQ CODE:Full  Prior to Admission Living  Arrangement:home Anticipated Discharge Location:SNF Barriers to [place] authorization Dispo: Anticipated discharge in approximately2-3day(s).  Gaylan Gerold, DO 08/27/2019, 6:04 AM Pager: [phone] After 5pm on weekdays and 1pm on weekends: On Call pager [phone]

## 2019-08-28 LAB — BASIC METABOLIC PANEL
Anion gap: 7 (ref 5–15)
BUN: 18 mg/dL (ref 8–23)
CO2: 25 mmol/L (ref 22–32)
Calcium: 8.8 mg/dL — ABNORMAL LOW (ref 8.9–10.3)
Chloride: 100 mmol/L (ref 98–111)
Creatinine, Ser: 1.46 mg/dL — ABNORMAL HIGH (ref 0.44–1.00)
GFR calc Af Amer: 38 mL/min — ABNORMAL LOW (ref >60)
GFR calc non Af Amer: 33 mL/min — ABNORMAL LOW (ref >60)
Glucose, Bld: 82 mg/dL (ref 70–99)
Potassium: 4.3 mmol/L (ref 3.5–5.1)
Sodium: 132 mmol/L — ABNORMAL LOW (ref 135–145)

## 2019-08-28 LAB — SARS CORONAVIRUS 2 BY RT PCR (HOSPITAL ORDER, PERFORMED IN ~~LOC~~ HOSPITAL LAB): SARS Coronavirus 2: NEGATIVE

## 2019-08-28 MED ORDER — HYDROXYZINE HCL 10 MG PO TABS
10.0000 mg | ORAL_TABLET | Freq: Three times a day (TID) | ORAL | Status: DC | PRN
  Administered 2019-08-28: 10 mg via ORAL
  Filled 2019-08-28 (×2): qty 1

## 2019-08-28 NOTE — Progress Notes (Signed)
Report called and given to Deberah Castle, RN at [facility].

## 2019-08-28 NOTE — Progress Notes (Signed)
Subjective:   Hospital day: 9  Overnight event: No  Patient is sitting up in recliner chair during examination.  She is pleasant and in no acute distress.  She feels well this morning.  She states that her legs are still swelling.  She also complains of itchiness on her back.  She is agreeable for discharge to SNF, daughter coming to help her move.  Objective:  Vital signs in last 24 hours: Vitals:   08/27/19 0936 08/27/19 1635 08/27/19 2122 08/28/19 0450  BP: 128/69 120/68 (!) 135/70 (!) 128/60  Pulse: 76 68 68 70  Resp: 16 16 18 18   Temp: 98.7 F (37.1 C) 98.1 F (36.7 C) 98 F (36.7 C) 98.3 F (36.8 C)  TempSrc: Oral Oral  Oral  SpO2: 96% 95% 98% 98%  Weight:        Physical Exam  Physical Exam Constitutional:      General: She is not in acute distress.    Appearance: She is normal weight.  HENT:     Head: Normocephalic.  Eyes:     General: No scleral icterus.    Conjunctiva/sclera: Conjunctivae normal.  Cardiovascular:     Rate and Rhythm: Normal rate and regular rhythm.     Heart sounds: Murmur (3/6 systolic murmur at upper sternal border) heard.   Pulmonary:     Effort: Pulmonary effort is normal. No respiratory distress.     Comments: Very mild diminished breath sound of the left lung base, improved compared to yesterday Abdominal:     General: Bowel sounds are normal. There is no distension.     Palpations: Abdomen is soft.     Tenderness: There is no abdominal tenderness.  Musculoskeletal:        General: Normal range of motion.     Cervical back: Normal range of motion.     Right lower leg: Edema (+1) present.     Left lower leg: Edema (+1) present.  Skin:    General: Skin is warm.     Coloration: Skin is not jaundiced.  Neurological:     Mental Status: She is alert.  Psychiatric:        Mood and Affect: Mood normal.        Behavior: Behavior normal.     Assessment/Plan: [name] is an 86 year old woman with PMHx of HTN, hypothyroidism,  fibromyalgia, OA, GERD, DDD who presented to the ED with fatigue, found to have AKI on CKD andhyperosmolarhyponatremia.Patient appears clinically betterand no longer require dialysis. Pending insurance authorization for SNF placement.     Principal Problem:   Acute on chronic renal failure w/ Uremia (HCC) Active Problems:   Essential hypertension   Anxiety   Hyperosmolar Hyponatremia   Normal anion gap metabolic acidosis   Right rib fracture   Uremia   Acute kidney injury on CKD Renal function back to baseline, creatinine 1.46 today. Her lower extremity edema are stable.  Her breathing is improved.  Patient will be going to a SNF for rehab.  She is no longer require hemodialysis. -Continue to hold her home HTN ARB andHCTZ.  Patient will follow up with her PCP to readjust her blood pressure medication. -Continue Lasix 20 mg p.o. every other day as needed -Patient is stable for discharge today.   Atelectasis of left lung base Likely secondary to rib pain and not ambulating much.  Patient O2 still satting well on room air at 98%.  Orders incentive spirometry and flutter valve.  Will  encourage patient to use it.   Right rib fracture Likely secondary to falls a few months ago. Conservative treatment with pain control at the moment.  - Tylenol 325 mg Q6h PRN -Oxycodoneto10mg  TID PRN - Bowel regimen with MiraLAX and Senokot   Hypothyroidism -TSH 0.42 -Continue levothyroxine 112 mcgand 100 mcg alternatingdaily   Asthma - Breo ellipta daily - Albuterol PRN   Diet:regular IVF:no XKP:[id number] SQ CODE:Full  Prior to Admission Living Arrangement:home Anticipated Discharge Location:SNF Barriers to Discharge:No Dispo: Anticipated discharge today   Gaylan Gerold, DO 08/28/2019, 6:16 AM Pager: [phone] After 5pm on weekdays and 1pm on weekends: On Call pager [phone]

## 2019-08-28 NOTE — Plan of Care (Signed)
  Problem: Education: Goal: Knowledge of disease and its progression will improve Outcome: Completed/Met   Problem: Health Behavior/Discharge Planning: Goal: Ability to manage health-related needs will improve Outcome: Completed/Met   Problem: Clinical Measurements: Goal: Complications related to the disease process or treatment will be avoided or minimized Outcome: Completed/Met   Problem: Activity: Goal: Activity intolerance will improve Outcome: Completed/Met   Problem: Fluid Volume: Goal: Fluid volume balance will be maintained or improved Outcome: Completed/Met   Problem: Nutritional: Goal: Ability to make appropriate dietary choices will improve Outcome: Completed/Met   Problem: Urinary Elimination: Goal: Progression of disease will be identified and treated Outcome: Completed/Met

## 2019-08-28 NOTE — Progress Notes (Signed)
Physical Therapy Treatment Patient Details Name: Penny Perez MRN: [id number] DOB: 1932/04/01 Today's Date: 08/28/2019    History of Present Illness Pt is an 86 year old woman presenting to the hospital with CRF. Pt has a PMHx of Essential HTN, hypothyroidism, fibromyalgia, OA, GERD, DDD who presented to the ED with fatigue, found to have AKI on CKD and hyperosmolar hyponatremia. Recent hx of R Rib fx 6-9. Pt is s/p R IJ HD catheter placement and dialysis initiated during admission    PT Comments    Pt was seen for gait and light resisted LE ex with care to avoid pressure on lower legs due to bruising tendency.  Pt is noting discomfort on R patella, has lateral and cranial deviations, would benefit from corrective taping.  Talked with pt and her daughter about this since taping is not an acute therapy modality.  Follow up with SNF plan as pt is still very appropriate for this care level.   Follow Up Recommendations  SNF     Equipment Recommendations  None recommended by PT    Recommendations for Other Services       Precautions / Restrictions Precautions Precautions: Fall Precaution Comments: has painful and sensitive skin on LE;s Restrictions Weight Bearing Restrictions: No    Mobility  Bed Mobility Overal bed mobility: Needs Assistance             General bed mobility comments: up in recliner  Transfers Overall transfer level: Needs assistance Equipment used: 1 person hand held assist Transfers: Sit to/from Stand Sit to Stand: Min guard         General transfer comment: min guard for safety and to control standing balance  Ambulation/Gait Ambulation/Gait assistance: Min guard Gait Distance (Feet): 60 Feet Assistive device: Rolling walker (2 wheeled) Gait Pattern/deviations: Step-through pattern;Wide base of support Gait velocity: decreased Gait velocity interpretation: <1.31 ft/sec, indicative of household ambulator General Gait Details: reminders for  safety with turning radius esp to set up to sit   Stairs             Wheelchair Mobility    Modified Rankin (Stroke Patients Only)       Balance Overall balance assessment: Needs assistance   Sitting balance-Leahy Scale: Fair       Standing balance-Leahy Scale: Fair Standing balance comment: dynamic skills require help to support balance                            Cognition Arousal/Alertness: Awake/alert Behavior During Therapy: WFL for tasks assessed/performed Overall Cognitive Status: Within Functional Limits for tasks assessed                                 General Comments: requires a great deal of volume on speech to hear PT instructions      Exercises General Exercises - Lower Extremity Ankle Circles/Pumps: AROM;5 reps Long Arc Quad: Strengthening;10 reps Heel Slides: Strengthening;10 reps Hip ABduction/ADduction: Strengthening;10 reps    General Comments General comments (skin integrity, edema, etc.): pt left legs down but is beginning to collect some fluid in limbs      Pertinent Vitals/Pain Pain Assessment: Faces Faces Pain Scale: Hurts little more Pain Location: R knee Pain Descriptors / Indicators: Grimacing;Guarding Pain Intervention(s): Limited activity within patient's tolerance;Repositioned    Home Living  Prior Function            PT Goals (current goals can now be found in the care plan section) Acute Rehab PT Goals Patient Stated Goal: be discharged from hospital soon Progress towards PT goals: Progressing toward goals    Frequency    Min 2X/week      PT Plan Current plan remains appropriate    Co-evaluation              AM-PAC PT "6 Clicks" Mobility   Outcome Measure  Help needed turning from your back to your side while in a flat bed without using bedrails?: A Little Help needed moving from lying on your back to sitting on the side of a flat bed without  using bedrails?: A Little Help needed moving to and from a bed to a chair (including a wheelchair)?: A Little Help needed standing up from a chair using your arms (e.g., wheelchair or bedside chair)?: A Little Help needed to walk in hospital room?: A Little Help needed climbing 3-5 steps with a railing? : A Lot 6 Click Score: 17    End of Session Equipment Utilized During Treatment: Gait belt Activity Tolerance: Patient tolerated treatment well;Treatment limited secondary to medical complications (Comment);Other (comment) (has R patellar grinding) Patient left: in chair;with call bell/phone within reach;with family/visitor present Nurse Communication: Mobility status;Other (comment) (discharge planning team to report R knee irritation) PT Visit Diagnosis: Unsteadiness on feet (R26.81);Difficulty in walking, not elsewhere classified (R26.2)     Time: 7622-6333 PT Time Calculation (min) (ACUTE ONLY): 25 min  Charges:  $Gait Training: 8-22 mins $Therapeutic Exercise: 8-22 mins                    Ramond Dial 08/28/2019, 1:31 PM  Mee Hives, PT MS Acute Rehab Dept. Number: [place] [facility] [place]

## 2019-08-28 NOTE — Progress Notes (Signed)
DISCHARGE NOTE SNF [facility] to be discharged Skilled nursing facility per MD order. Patient verbalized understanding.  Skin clean, dry and intact without evidence of skin break down, no evidence of skin tears noted. IV catheter discontinued intact. Site without signs and symptoms of complications. Dressing and pressure applied. Pt denies pain at the site currently. No complaints noted.  Patient free of lines, drains, and wounds.   Discharge packet assembled. An After Visit Summary (AVS) was printed and given to patients daughter. Patient escorted via [facility] and discharged to designated [place] via private auto. Report called to accepting facility; all questions and concerns addressed.   Aneta Mins BSN, RN3

## 2019-08-28 NOTE — Discharge Summary (Signed)
Name: Penny Perez MRN: [id number] DOB: October 04, 1932 86 y.o. PCP: Chesley Noon, MD  Date of Admission: 08/19/2019  7:13 AM Date of Discharge: 08/28/2019  12: 03 PM Attending Physician: Sid Falcon, MD  Discharge Diagnosis: 1.  Acute on chronic renal failure with uremia 2.  Hyperosmolar hyponatremia 3.  Normal anion gap metabolic acidosis 4.  Right rib fracture 5.  Essential hypertension   Discharge Medications: Allergies as of 08/28/2019      Reactions   Linezolid Nausea And Vomiting   Sulfa Antibiotics    Other reaction(s): Other Abdominal pain and bloating Abdominal pain and bloating   Ampicillin Other (See Comments)   Ulcers in mouth,stomach upset   Cephalexin Other (See Comments)   Mouth ulcer   Clarithromycin Other (See Comments)   Mouth ulcer   Codeine Other (See Comments)   unknown   Doxycycline Other (See Comments)   Mouth ulcer   Erythromycin Other (See Comments)   Mouth ulcer   Gatifloxacin Other (See Comments)   Mouth ulcer   Hydrocodone Other (See Comments)   unknown   Penicillins Other (See Comments)   Mouth ulcer   Latex Rash   Tape Rash      Medication List    STOP taking these medications   methocarbamol 500 MG tablet Commonly known as: ROBAXIN   olmesartan-hydrochlorothiazide 20-12.5 MG tablet Commonly known as: BENICAR HCT     TAKE these medications   AeroChamber MV inhaler by Other route. Use as instructed   albuterol 108 (90 Base) MCG/ACT inhaler Commonly known as: ProAir HFA 2 puffs every 4 hours if needed- rescue inhaler What changed:   how much to take  how to take this  when to take this  reasons to take this  additional instructions   budesonide-formoterol 80-4.5 MCG/ACT inhaler Commonly known as: SYMBICORT Inhale 2 puffs into the lungs 2 (two) times daily. Rinse mouth   conjugated estrogens vaginal cream Commonly known as: Premarin USE AS DIRECTED What changed:   how much to take  how to take  this  when to take this   cromolyn 5.2 MG/ACT nasal spray Commonly known as: NASALCROM Place 1 spray into the nose 4 (four) times daily as needed for allergies or rhinitis.   diazepam 10 MG tablet Commonly known as: VALIUM Take 1 tablet by mouth as needed for anxiety or sleep.   estradiol 0.05 MG/24HR patch Commonly known as: Vivelle-Dot APPLY 1 PATCH EXTERNALLY TO THE SKIN EVERY 72 HOURS What changed:   how much to take  how to take this  when to take this  additional instructions   fish oil-omega-3 fatty acids 1000 MG capsule Take 1 g by mouth daily.   Flax Seed Oil 1000 MG Caps Take by mouth.   fluocinonide cream 0.05 % Commonly known as: LIDEX Apply 1 application topically 2 (two) times daily.   fluticasone 50 MCG/ACT nasal spray Commonly known as: FLONASE USE 1 SPRAY IN EACH NOSTRIL EVERY DAY AS DIRECTED   furosemide 40 MG tablet Commonly known as: LASIX Take 20 mg by mouth daily as needed for fluid or edema.   hyoscyamine 0.125 MG tablet Commonly known as: LEVSIN Take 0.125 mg by mouth every 6 (six) hours as needed for bladder spasms or cramping.   Synthroid 112 MCG tablet Generic drug: levothyroxine Take 112 mcg by mouth every other day.   levothyroxine 100 MCG tablet Commonly known as: SYNTHROID Take 100 mcg by mouth every other day.   Linzess  [place] capsule Generic drug: linaclotide Take 1 capsule (290 mcg total) by mouth daily as needed (constipation).   loratadine 10 MG tablet Commonly known as: CLARITIN Take 10 mg by mouth daily as needed for allergies.   Movantik 25 MG Tabs tablet Generic drug: naloxegol oxalate Take 1 tablet (25 mg total) by mouth daily as needed (constipation).   nystatin powder Commonly known as: MYCOSTATIN/NYSTOP Apply topically 2 (two) times daily. To effected areas What changed:   when to take this  reasons to take this   omeprazole 40 MG capsule Commonly known as: PRILOSEC Take 40 mg by mouth  daily.   oxyCODONE-acetaminophen 10-325 MG tablet Commonly known as: PERCOCET Take 0.5 tablets by mouth 3 (three) times daily as needed for pain.   potassium chloride 10 MEQ tablet Commonly known as: KLOR-CON Take 10 mEq by mouth daily.   Vitamin D3 25 MCG (1000 UT) Caps Take 1 capsule by mouth daily.            Discharge Care Instructions  (From admission, onward)         Start     Ordered   08/28/19 0000  Discharge wound care:       Comments: Per wound care nurse   08/28/19 1145          Disposition and follow-up:   [name] was discharged from [facility] in Stable condition.  At the hospital follow up visit please address:  1.   Acute on chronic renal failure with uremia Patient will admitted to the hospital for AKI on CKD secondary to her dehydration and thiazide diuretic use.  She she got a temporary catheter placed and received 1 session of hemodialysis.  Her renal function was back to baseline after and stable.  Please continue to monitor renal function and electrolytes.  Please hold her ACE inhibitor and thiazide.  Please readjust her blood pressure medication  Right rib fracture This is likely secondary to a fall a few months ago.  Conservative treatment with pain control.  Please reassure adequate pain management.  2.  Labs / imaging needed at time of follow-up: No  3.  Pending labs/ test needing follow-up: No  Follow-up Appointments:  Contact information for follow-up providers    [facility]. Schedule an appointment as soon as possible for a visit in 1 week(s).   Specialty: Nephrology Why: Hospital follow-up Contact information: [address] [place] [address] [phone]            Contact information for after-discharge care    Destination    [facility] .   Service: Skilled Nursing Contact information: [address] [place] Hospital Course by problem list: 1. AKI on CKD Ms. Penny Perez is an 86 year old woman with PMHx of HTN, hypothyroidism, fibromyalgia, OA, GERD, DDD who presented to the ED with worsening fatigue and poor PO intake since her last admission in April 2021. Patient has history of CKD stage 4 and baseline creatine is 1.4. Creatine on admission is 5.8. Renal US is negative for obstruction or stone. This is likely Uremia secondary to dehydration 2/2 poor PO intake on top of HCTZ and Lasix used. FeNa calculated 0.5 % which is consistent with pre-renal. Patient received 1L of LR and 1L of NS.  Given her clinical picture and worsening metabolic acidosis, patient received 1 short session of HD on 7/22. She improved significantly after the HD and continued to do well. Her creatine was back to baseline on 7/25 and was stable. Patient was no longer require HD and her dialysis cath was removed.  Patient was discharged to [facility] for rehab.  Hypertonic Hyponatremia  Sodium on admission 124. This is likely the results of HCTZ and Lasix used. Serum osmolality 312, which is due to her being dehydrated. Sodium was corrected at goal of not increase more than 4-6 mmol/L in 24h. Her mental status was stable throughout this admission.  Her sodium was stable.  Normal anion gap metabolic acidosis VBG shows pH of 7.2 with bicarb of 10 and pCO2 of 26. This is normal anion gap primary metabolic acidosis with inadequate respiratory compensation. Likely secondary to renal loss of bicarb. Sodium bicarb given PO with 1 amp of bicarb IV. Bicarb improved after dialysis and was stable.  Right rib fracture Likely secondary to falls a few months ago. Patient had conservative treatment with pain control with Tylenol and Oxycodone.     Discharge Vitals:   BP (!) 114/62 (BP Location: Right Arm)   Pulse 79   Temp 98.5 F (36.9 C) (Oral)   Resp 18    Wt 52.9 kg   SpO2 98%   BMI 23.56 kg/m   Pertinent Labs, Studies, and Procedures:  CBC Latest Ref Rng & Units 08/26/2019 08/25/2019 08/24/2019  WBC 4.0 - 10.5 K/uL 6.3 5.5 5.9  Hemoglobin 12.0 - 15.0 g/dL 10.0(L) 8.4(L) 8.4(L)  Hematocrit 36 - 46 % 31.1(L) 26.2(L) 25.2(L)  Platelets 150 - 400 K/uL 222 177 165   CMP Latest Ref Rng & Units 08/28/2019 08/27/2019 08/26/2019  Glucose 70 - 99 mg/dL 82 88 95  BUN 8 - 23 mg/dL 18 19 16   Creatinine 0.44 - 1.00 mg/dL 1.46(H) 1.68(H) 1.46(H)  Sodium 135 - 145 mmol/L 132(L) 132(L) 131(L)  Potassium 3.5 - 5.1 mmol/L 4.3 4.3 3.8  Chloride 98 - 111 mmol/L 100 99 94(L)  CO2 22 - 32 mmol/L 25 26 25   Calcium 8.9 - 10.3 mg/dL 8.8(L) 8.7(L) 9.4  Total Protein 6.5 - 8.1 g/dL - - -  Total Bilirubin 0.3 - 1.2 mg/dL - - -  Alkaline Phos 38 - 126 U/L - - -  AST 15 - 41 U/L - - -  ALT 0 - 44 U/L - - -   CT ABDOMEN PELVIS WO CONTRAST  Result Date: 08/19/2019 CLINICAL DATA:  Right lower quadrant abdominal pain. EXAM: CT ABDOMEN AND PELVIS WITHOUT CONTRAST TECHNIQUE: Multidetector CT imaging of the abdomen and pelvis was performed following the standard protocol without IV contrast. COMPARISON:  CT of the abdomen pelvis 05/04/2019 FINDINGS: Lower chest: Linear atelectasis and scarring is again noted at both lung bases. No nodule or mass lesion is present. No significant consolidation is present. Atherosclerotic changes are noted at the aortic valve. Heart size is normal. No significant pleural or pericardial effusion is present. Hepatobiliary: Liver is within normal limits. Calcifications in the porta hepatis are stable. Gallbladder is unremarkable. Pancreas: Atrophic no focal lesions. Spleen: Within normal limits. Adrenals/Urinary Tract: The adrenal glands are normal bilaterally. Kidneys and ureters are normal. The urinary bladder is within normal limits. Stomach/Bowel: The stomach and duodenum are within normal limits. Small bowel is unremarkable. Terminal ileum is  within normal limits. Calcification in the small bowel mesentery of the left upper quadrant is stable. The  appendix is not discretely visualized and may be surgically absent. The ascending transverse colon are within normal limits. Descending and sigmoid colon are normal. Vascular/Lymphatic: Atherosclerotic calcifications are present in the aorta and branch vessels without aneurysm. No significant retroperitoneal or pelvic adenopathy is present. No significant para-aortic nodes are present. Reproductive: Uterus and bilateral adnexa are unremarkable. Other: Paraumbilical hernia contains fat without bowel. No other significant ventral hernias are present. No significant free fluid or free air is present. Musculoskeletal: Levoconvex scoliosis is again noted. Posterior lumbar fusion is noted at L4-5 vertebral body heights are normal. Grade 1 anterolisthesis present at L3-4, L4-5, and L5-S1. Bilateral total hip arthroplasties are present. IMPRESSION: 1. No acute or focal lesion to explain the patient's symptoms. 2. Paraumbilical hernia contains fat without bowel. 3. Aortic Atherosclerosis (ICD10-I70.0). Electronically Signed   By: San Morelle M.D.   On: 08/19/2019 13:14   DG Ribs Unilateral Right  Result Date: 08/19/2019 CLINICAL DATA:  Fall in April. Questionable healing rib fractures on portable chest x-ray. Kyphosis. EXAM: RIGHT RIBS - 2 VIEW COMPARISON:  08/19/2019 FINDINGS: Remote RIGHT shoulder arthroplasty. There is callus surrounding subacute fractures of the RIGHT LATERAL ribs 7-9. Fracture of the RIGHT 6th rib is acute or subacute. No pneumothorax. Thoracolumbar scoliosis. IMPRESSION: 1. Subacute fractures of RIGHT ribs 7-9. 2. Acute or subacute fracture of the RIGHT 6th rib. Electronically Signed   By: Nolon Nations M.D.   On: 08/19/2019 12:29   US Renal  Result Date: 08/19/2019 CLINICAL DATA:  Acute kidney injury EXAM: RENAL / URINARY TRACT ULTRASOUND COMPLETE COMPARISON:  CT abdomen  pelvis 08/19/2019 FINDINGS: Right Kidney: Renal measurements: 8.9 x 3 x 4 cm = volume: 50.9 mL. Cortical thinning and increased renal cortical echogenicity. No concerning mass, shadowing calculus or hydronephrosis. Left Kidney: Renal measurements: 8.5 x 3.2 x 3.4 cm = volume: 47.9 mL. More normal cortical echogenicity which is within normal limits. No mass, shadowing calculus or hydronephrosis. Bladder: Appears normal for degree of bladder distention. Other: None. IMPRESSION: Diminutive, likely atrophic kidneys bilaterally More pronounced cortical thinning and increased renal cortical echogenicity of the right kidney compatible with medical renal disease. Otherwise unremarkable renal ultrasound. Electronically Signed   By: Lovena Le M.D.   On: 08/19/2019 17:47   IR Fluoro Guide CV Line Right  Result Date: 08/20/2019 INDICATION: 86 year old female referred for temporary hemodialysis catheter EXAM: ULTRASOUND-GUIDED ACCESS RIGHT INTERNAL JUGULAR VEIN IMAGE GUIDED PLACEMENT OF TEMPORARY HEMODIALYSIS CATHETER MEDICATIONS: None ANESTHESIA/SEDATION: None. FLUOROSCOPY TIME:  Fluoroscopy Time: 0 minutes 24 seconds (4 9 mGy). COMPLICATIONS: None PROCEDURE: Informed written consent was obtained from the patient and the patient's family after a thorough discussion of the procedural risks, benefits and alternatives. All questions were addressed. A timeout was performed prior to the initiation of the procedure. The right neck and chest was prepped with chlorhexidine, and draped in the usual sterile fashion using maximum barrier technique (cap and mask, sterile gown, sterile gloves, large sterile sheet, hand hygiene and cutaneous antiseptic). Local anesthesia was attained by infiltration with 1% lidocaine without epinephrine. Ultrasound demonstrated patency of the right internal jugular vein, and this was documented with an image. Under real-time ultrasound guidance, this vein was accessed with a 21 gauge micropuncture  needle and image documentation was performed. A small dermatotomy was made at the access site with an 11 scalpel. A 0.018" wire was advanced into the SVC and the access needle exchanged for a 58F micropuncture vascular sheath. The 0.018" wire was then removed and a 0.035"  wire advanced into the IVC. Upon withdrawal of the 018 wire, the wire was marked for appropriate length of the internal portion of the catheter. A 20 cm catheter was selected. Skin and subcutaneous tissues were serially dilated. Catheter was placed on the wire. The catheter tip is positioned in the upper right atrium. This was documented with a spot image. Both ports of the hemodialysis catheter were then tested for excellent function. The ports were then locked with heparinized lock. Patient tolerated the procedure well and remained hemodynamically stable throughout. No complications were encountered and no significant blood loss was encountered. IMPRESSION: Status post right IJ temporary hemodialysis catheter. Catheter may be converted if needed. Signed, Dulcy Fanny. Dellia Nims, [facility] . Electronically Signed   By: Corrie Mckusick D.O.   On: 08/20/2019 17:04   IR US Guide Vasc Access Right  Result Date: 08/20/2019 INDICATION: 86 year old female referred for temporary hemodialysis catheter EXAM: ULTRASOUND-GUIDED ACCESS RIGHT INTERNAL JUGULAR VEIN IMAGE GUIDED PLACEMENT OF TEMPORARY HEMODIALYSIS CATHETER MEDICATIONS: None ANESTHESIA/SEDATION: None. FLUOROSCOPY TIME:  Fluoroscopy Time: 0 minutes 24 seconds (4 9 mGy). COMPLICATIONS: None PROCEDURE: Informed written consent was obtained from the patient and the patient's family after a thorough discussion of the procedural risks, benefits and alternatives. All questions were addressed. A timeout was performed prior to the initiation of the procedure. The right neck and chest was prepped with chlorhexidine, and draped in the usual  sterile fashion using maximum barrier technique (cap and mask, sterile gown, sterile gloves, large sterile sheet, hand hygiene and cutaneous antiseptic). Local anesthesia was attained by infiltration with 1% lidocaine without epinephrine. Ultrasound demonstrated patency of the right internal jugular vein, and this was documented with an image. Under real-time ultrasound guidance, this vein was accessed with a 21 gauge micropuncture needle and image documentation was performed. A small dermatotomy was made at the access site with an 11 scalpel. A 0.018" wire was advanced into the SVC and the access needle exchanged for a 74F micropuncture vascular sheath. The 0.018" wire was then removed and a 0.035" wire advanced into the IVC. Upon withdrawal of the 018 wire, the wire was marked for appropriate length of the internal portion of the catheter. A 20 cm catheter was selected. Skin and subcutaneous tissues were serially dilated. Catheter was placed on the wire. The catheter tip is positioned in the upper right atrium. This was documented with a spot image. Both ports of the hemodialysis catheter were then tested for excellent function. The ports were then locked with heparinized lock. Patient tolerated the procedure well and remained hemodynamically stable throughout. No complications were encountered and no significant blood loss was encountered. IMPRESSION: Status post right IJ temporary hemodialysis catheter. Catheter may be converted if needed. Signed, Dulcy Fanny. Dellia Nims, [facility] . Electronically Signed   By: Corrie Mckusick D.O.   On: 08/20/2019 17:04   DG Chest Portable 1 View  Result Date: 08/19/2019 CLINICAL DATA:  Weakness and fatigue for several months EXAM: PORTABLE CHEST 1 VIEW COMPARISON:  Portable exam 1019 hours compared to 05/04/2019 FINDINGS: Normal heart size, mediastinal contours, and pulmonary vascularity. Atherosclerotic  calcification aorta. Bronchitic changes without pulmonary infiltrate, pleural effusion or pneumothorax. Osseous demineralization with advanced thoracolumbar scoliosis. Nondisplaced fracture of the lateral RIGHT sixth rib. Nodular density identified in the mid to lower RIGHT lung, potentially representing callus at a healing fracture of the posterior RIGHT seventh rib though pulmonary nodule not entirely  excluded; recommend dedicated rib radiographs to evaluate. RIGHT shoulder prosthesis. IMPRESSION: Bronchitic changes without infiltrate. Nondisplaced fracture of lateral RIGHT sixth rib with question healing fracture of the posterior RIGHT seventh rib accounting for a nodular density in the LEFT lung; recommend dedicated RIGHT rib radiographs to confirm callus at a healing seventh rib fracture and exclude pulmonary nodule. Electronically Signed   By: Lavonia Dana M.D.   On: 08/19/2019 11:05   Discharge Instructions: Discharge Instructions    Call MD for:  persistant nausea and vomiting   Complete by: As directed    Call MD for:  severe uncontrolled pain   Complete by: As directed    Diet - low sodium heart healthy   Complete by: As directed    Discharge instructions   Complete by: As directed    Ms. Penny Perez,  It was a pleasure taking care of you during this admission.  You were admitted for acute kidney injury due to dehydration and water pill used.  Your kidney function returned to normal after one section of hemodialysis. Please stop taking the Benicar. Please take the furosemide 20 mg as needed for for swelling. Please resume other home medication medications as instructed. Please follow-up with your primary care physician after discharge. Please follow-up with the kidney doctor after discharge.  Take care, Gaylan Gerold, DO   Discharge wound care:   Complete by: As directed    Per wound care nurse   Increase activity slowly   Complete by: As directed       Signed: Gaylan Gerold,  DO 08/28/2019, 12:04 PM   Pager: [phone]

## 2019-08-28 NOTE — TOC Transition Note (Signed)
Transition of Care [facility]) - CM/SW Discharge Note   Patient Details  Name: Miasha Emmons MRN: [id number] Date of Birth: 1932/03/10  Transition of Care [facility]) CM/SW Contact:  Sable Feil, LCSW Phone Number: 08/28/2019, 3:04 PM   Clinical Narrative:  Patient medically stable for discharge and admissions director, Elyse Hsu with [facility] received insurance authorization today. Discharge clinicals transmitted to facility and nurse provided with information to call report. Patient will be transported to [facility] by her daughter Karlene Einstein.    Final next level of care: [facility]) Barriers to Discharge: Barriers Resolved (Received insurance auth)   Patient Goals and CMS Choice Patient states their goals for this hospitalization and ongoing recovery are:: Patient and family agreeable to [facility] rehab CMS Medicare.gov Compare Post Acute Care list provided to:: Patient Represenative (must comment) (Informed daughter regarding [email or web address]) Choice offered to / list presented to : Adult Children  Discharge Placement   Existing PASRR number confirmed : 08/22/19          Patient chooses bed at: [facility] ([place]) Patient to be transferred to facility by: Daughter, Karlene Einstein Name of family member notified: Daughter Karlene Einstein at bedside Patient and family notified of of transfer: 08/28/19  Discharge Plan and Services In-house Referral: Clinical Social Work                                  Social Determinants of Health (SDOH) Interventions  No SDOH interventions requested or needed at discharge.   Readmission Risk Interventions No flowsheet data found.

## 2019-09-09 ENCOUNTER — Encounter (HOSPITAL_BASED_OUTPATIENT_CLINIC_OR_DEPARTMENT_OTHER): Payer: Medicare Other | Admitting: Physician Assistant

## 2019-09-16 ENCOUNTER — Encounter (HOSPITAL_BASED_OUTPATIENT_CLINIC_OR_DEPARTMENT_OTHER): Payer: Medicare Other | Admitting: Physician Assistant

## 2019-09-23 ENCOUNTER — Encounter (HOSPITAL_BASED_OUTPATIENT_CLINIC_OR_DEPARTMENT_OTHER): Payer: Medicare Other | Attending: Physician Assistant | Admitting: Physician Assistant

## 2019-09-23 ENCOUNTER — Other Ambulatory Visit (HOSPITAL_COMMUNITY)
Admission: RE | Admit: 2019-09-23 | Discharge: 2019-09-23 | Disposition: A | Discharge status: Home / Self Care | Payer: Medicare Other | Source: Other Acute Inpatient Hospital | Attending: Physician Assistant | Admitting: Physician Assistant

## 2019-09-23 DIAGNOSIS — L97822 Non-pressure chronic ulcer of other part of left lower leg with fat layer exposed: Secondary | ICD-10-CM | POA: Diagnosis not present

## 2019-09-23 DIAGNOSIS — I10 Essential (primary) hypertension: Secondary | ICD-10-CM | POA: Insufficient documentation

## 2019-09-23 DIAGNOSIS — Y838 Other surgical procedures as the cause of abnormal reaction of the patient, or of later complication, without mention of misadventure at the time of the procedure: Secondary | ICD-10-CM | POA: Insufficient documentation

## 2019-09-23 DIAGNOSIS — I872 Venous insufficiency (chronic) (peripheral): Secondary | ICD-10-CM | POA: Insufficient documentation

## 2019-09-23 DIAGNOSIS — L089 Local infection of the skin and subcutaneous tissue, unspecified: Secondary | ICD-10-CM | POA: Diagnosis present

## 2019-09-23 DIAGNOSIS — L98418 Non-pressure chronic ulcer of buttock with other specified severity: Secondary | ICD-10-CM | POA: Diagnosis not present

## 2019-09-23 DIAGNOSIS — T8189XA Other complications of procedures, not elsewhere classified, initial encounter: Secondary | ICD-10-CM | POA: Diagnosis not present

## 2019-09-23 NOTE — Progress Notes (Addendum)
Penny Perez, Penny Perez ([id number]) Visit Report for 09/23/2019 Chief Complaint Document Details Patient Name: Date of Service: Penny Perez, Penny Perez [facility] 09/23/2019 2:00 PM Medical Record Number: [id number] Patient Account Number: [id number] Date of Birth/Sex: Treating RN: [birth year] (86 y.o. [name] Primary Care Provider: [name] Other Clinician: Referring Provider: Treating Provider/Extender: [name] in Treatment: [phone] Information Obtained from: Patient Chief Complaint Right buttock ulcer and left leg ulcer Electronic Signature(s) Signed: 09/23/2019 2:38:50 PM By: Worthy Keeler PA-C Entered By: Worthy Keeler on 09/23/2019 14:38:49 -------------------------------------------------------------------------------- HPI Details Patient Name: Date of Service: Penny Perez, Penny Perez 09/23/2019 2:00 PM Medical Record Number: [id number] Patient Account Number: [id number] Date of Birth/Sex: Treating RN: Aug 07, 1932 (86 y.o. [name] Primary Care Provider: [name] Other Clinician: Referring Provider: Treating Provider/Extender: [name] in Treatment: 42 History of Present Illness HPI Description: 12/03/2018 on evaluation today patient appears for initial inspection here in our clinic concerning an issue that she has been having a light having in the right gluteal region with an ulcer which initially she describes as having been a knot which when she went to see the dermatologist was biopsied and this was on 08/26/2018. Subsequently that revealed what appeared to be an inflamed ulcer with granulation tissue. Fortunately there was no signs of active infection based on what they saw on pathology. The patient states that since the biopsy she has been having a lot of pain. Fortunately there is no signs of active infection at this time no fever chills noted. She does have a history of hypertension but really no major  medical problems significant otherwise although she does have a lot of allergies including allergies to [facility] as well as antibiotics. In fact she has some ulcers in her mouth secondary to having been prescribed an antibiotic for the current ulcer. She states she has been having trouble sleeping due to the pain in the ulcer area. 12/10/2018 on evaluation today patient's wound actually showed signs of improvement as far as the loosening up of the necrotic tissue is concerned at this point. I do believe that that has done excellent at this point I think that we need to try and clear away some of the necrotic tissue in the base of the wound I discussed that with the patient as well. Fortunately she is not having any significant pain and states the Santyl did not burn although in the past she notes that it did cause her some trouble with the ulcer on her leg causing some burning. No fevers, chills, nausea, vomiting, or diarrhea. 12/17/2018 on evaluation today patient appears to be doing well with regard to her wound. This does not appear to be any more deep than it was after the last evaluation and debridement last week. With that being said there are some necrotic tissue on the surface of the wound which is can require some sharp debridement at this point. Fortunately there is no evidence of active infection at this time which is good news. No fevers, chills, nausea, vomiting, or diarrhea. 12/31/2018 on evaluation today patient appears to be doing a little better in regard to the overall appearance of her wound currently. She is tolerating the dressing changes without complication. Fortunately there is no signs of active infection at this time. With that being said she still has some necrotic tissue in the base of the wound that still needs to be removed this is very tough however and I was not able to  debrided away last week. She really does not want me to perform any debridement today either due to  how bad it hurt after I did this last time. With that being said I think we may be able to switch the dressing to something else to try to help with clearing this out a little better. 01/14/2019 upon evaluation today patient actually appears to be doing better with regard to her wound. She is showing signs of new granulation tissue and I do feel like that this is starting to improve. That is good news. Fortunately there is no evidence of active infection at this point. No fevers, chills, nausea, vomiting, or diarrhea. 12/30-Patient returns at 2 weeks, she does complain of some discomfort at night in the right gluteal area, she is obviously active and stays on her feet and not in a chair during the day. Denies any fevers chills shakes. Complains that the area on the ulcer feels wet fairly often 02/11/2019 on evaluation today patient appears to be doing okay with regard to her wound. She has been tolerating the dressing changes without complication. Fortunately there is no signs of active infection at this time. No fevers, chills, nausea, vomiting, or diarrhea. She does note however that she is having issues financially with having to come as frequently even as every other week. She states that she is not sure she is going to be able to continue to do so. Fortunately there is no signs of anything doing poorly I think we may be able to spread this out for her in order to help alleviate some of this especially in light of the fact that she does have home health coming out. 03/11/2019 upon evaluation today patient appears to be doing really roughly the same with regard to her wound. This is measuring close to where it was previous although may be slightly smaller. Fortunately there is no evidence of active infection at this time obviously. With that being said this is not healing quite as quickly as I would like to see therefore I may actually perform a repeat culture today to see if there is anything that  shows up at this point. Fortunately there is no sign of active infection systemically for certain. The patient is somewhat frustrated with how long this is taking I completely understand your frustration. Nonetheless I explained that wounds like this often do take a very long time to heal. 04/08/2019 upon evaluation today patient appears to be doing well with regard to her wound. This is not completely healed but does seem to be somewhat better. They have been using silver alginate at this point which is great news and overall the patient does seem to be making some progress. With that being said she did culture positive for MRSA unfortunately she is allergic to pretty much everything. I did want to discuss with her and she did mention today that she be willing to try the linezolid she is never taken this before. Hopefully it will not cause any problems for her. 06/03/2019 upon evaluation today patient appears to be doing well with regard to her wound. She does have a new wound on her left lower extremity that occurred as a result of swelling of her legs when she was in the hospital he took off of her fluid pills and this made some problem here. With that being said fortunately this seems to be getting better and the patient states she is really not worried about this she has been putting  antibiotic ointment on this at home and covering this with a drain seen which seems to be doing fairly well now that she is not draining as much. At one point her daughter was having to change the dressings much more frequently. Fortunately that has improved. Her swelling is better which is also good news. The patient was in the hospital from 05/04/2019 through 05/08/2019 where she had IV antibiotic therapy with vancomycin due to a C. difficile infection. She is better in regard to the infection but still having a lot of weakness and tiredness in general. She did get the gentamicin and they have been using this at home as far  as dressing changes are concerned. She tells me the hospital did not look at her wound at all during the time that she was there. 07/08/2019 upon evaluation today patient appears to be doing better in regard to her leg which is healed and that is excellent news. With regard to her wound in the right gluteal region this is still open and though it does not appear to be showing any signs of infection there is some dried gentamicin cream in the base of the wound. I think we probably do not need the gentamicin any longer based on what I am seeing she has a lot of new skin and epithelization on the sidewalls of the wound and I think this is good to heal with somewhat of a tunnel which is okay as long as it heals. Again this is something we discussed in the past. 7/13; monthly follow-up. The area that we are looking at is in the upper right buttock. The patient and her daughter told me that this was originally an abscess that was IandD or biopsied by dermatology. The wound circumference is inclement quite dramatically since last time in fact this would be much too small to put silver alginate in. 09/23/2019 on evaluation today patient appears to be doing better in regard to her wound in the right buttock region. This actually seems to be doing quite well. With that being said unfortunately she is having issues with her legs bilaterally today although she has more of a wound on the right and just leaking on the left. Electronic Signature(s) Signed: 09/23/2019 3:51:12 PM By: Worthy Keeler PA-C Entered By: Worthy Keeler on 09/23/2019 15:51:12 -------------------------------------------------------------------------------- Physical Exam Details Patient Name: Date of Service: ADASHA, BOEHME [facility] 09/23/2019 2:00 PM Medical Record Number: [id number] Patient Account Number: [id number] Date of Birth/Sex: Treating RN: [birth year] (86 y.o. [name] Primary Care Provider: [name] Other  Clinician: Referring Provider: Treating Provider/Extender: [name] in Treatment: 41 Constitutional Well-nourished and well-hydrated in no acute distress. Respiratory normal breathing without difficulty. Psychiatric this patient is able to make decisions and demonstrates good insight into disease process. Alert and Oriented x 3. pleasant and cooperative. Notes On inspection patient's wounds currently showed signs again of healing in regard to the buttock region which is great news overall very pleased in that regard. In regard to her legs I really do think she would benefit from a Curlex and Coban wrap to try to keep some of the edema under control here. We really need to be sure that she could still wear shoes however as we do not want her to be off balance and risk for falling again. Electronic Signature(s) Signed: 09/23/2019 3:51:38 PM By: Worthy Keeler PA-C Entered By: Worthy Keeler on 09/23/2019 15:51:37 -------------------------------------------------------------------------------- Physician Orders Details Patient Name: Date  of Service: LATOY, LABRIOLA [facility] 09/23/2019 2:00 PM Medical Record Number: [id number] Patient Account Number: [id number] Date of Birth/Sex: Treating RN: [birth year] (86 y.o. 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 - Wed. Return A Nurse Visit: -  Friday for rewrap Dressing Change Frequency Do not change entire dressing for one week. Skin Barriers/Peri-Wound Care Moisturizing lotion - to legs Wound Cleansing May shower with protection. Primary Wound Dressing Wound #3 Right,Anterior Lower Leg Calcium Alginate with Silver Secondary Dressing Zetuvit or Kerramax - left medial lower leg weeping area Wound #3 Right,Anterior Lower Leg Dry Gauze ABD pad Edema Control Kerlix and Coban - Bilateral Avoid standing for long periods of time Elevate legs to the level of the heart or above for 30 minutes daily and/or when sitting, a frequency of: - throughout the day Exercise regularly Laboratory naerobe culture (MICRO) - right ant lower leg Bacteria identified in Unspecified specimen by A LOINC Code: 353-6 Convenience Name: Anerobic culture Electronic Signature(s) Signed: 09/23/2019 6:40:41 PM By: Baruch Gouty RN, BSN Signed: 09/23/2019 7:06:00 PM By: Worthy Keeler PA-C Entered By: Baruch Gouty on 09/23/2019 15:55:24 -------------------------------------------------------------------------------- Problem List Details Patient Name: Date of Service: Penny Perez, Penny Perez 09/23/2019 2:00 PM Medical Record Number: [id number] Patient Account Number: [id number] Date of Birth/Sex: Treating RN: 16-Oct-1932 (86 y.o. 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 12/03/2018 No Yes I10 Essential (primary) hypertension 12/03/2018 No Yes I87.2 Venous insufficiency (chronic) (peripheral) 06/03/2019 No Yes L97.822 Non-pressure chronic ulcer of other part of left lower leg with fat  layer exposed5/05/2019 No Yes Inactive Problems Resolved Problems Electronic Signature(s) Signed: 09/23/2019 2:38:44 PM By: Worthy Keeler PA-C Entered By: Worthy Keeler on 09/23/2019 14:38:44 -------------------------------------------------------------------------------- Progress Note Details Patient Name: Date of Service: Penny Perez, Penny Perez Perez 09/23/2019 2:00 PM Medical Record Number: [id number] Patient Account Number: [id number] Date of Birth/Sex: Treating RN: 1932/10/06 (86 y.o. [name] Primary Care Provider: [name] Other Clinician: Referring Provider: Treating Provider/Extender: [name] in Treatment: 42 Subjective Chief Complaint Information obtained from Patient Right buttock ulcer and left leg ulcer History of Present Illness (HPI) 12/03/2018 on evaluation today patient appears for initial inspection here in our clinic concerning an issue that she has been having a light having in the right gluteal region with an ulcer which initially she describes as having been a knot which when she went to see the dermatologist was biopsied and this was on 08/26/2018. Subsequently that revealed what appeared to be an inflamed ulcer with granulation tissue. Fortunately there was no signs of active infection based on what they saw on pathology. The patient states that since the biopsy she has been having a lot of pain. Fortunately there is no signs of active infection at this time no fever chills noted. She does  have a history of hypertension but really no major medical problems significant otherwise although she does have a lot of allergies including allergies to [facility] as well as antibiotics. In fact she has some ulcers in her mouth secondary to having been prescribed an antibiotic for the current ulcer. She states she has been having trouble sleeping due to the pain in the ulcer area. 12/10/2018 on evaluation today patient's wound actually  showed signs of improvement as far as the loosening up of the necrotic tissue is concerned at this point. I do believe that that has done excellent at this point I think that we need to try and clear away some of the necrotic tissue in the base of the wound I discussed that with the patient as well. Fortunately she is not having any significant pain and states the Santyl did not burn although in the past she notes that it did cause her some trouble with the ulcer on her leg causing some burning. No fevers, chills, nausea, vomiting, or diarrhea. 12/17/2018 on evaluation today patient appears to be doing well with regard to her wound. This does not appear to be any more deep than it was after the last evaluation and debridement last week. With that being said there are some necrotic tissue on the surface of the wound which is can require some sharp debridement at this point. Fortunately there is no evidence of active infection at this time which is good news. No fevers, chills, nausea, vomiting, or diarrhea. 12/31/2018 on evaluation today patient appears to be doing a little better in regard to the overall appearance of her wound currently. She is tolerating the dressing changes without complication. Fortunately there is no signs of active infection at this time. With that being said she still has some necrotic tissue in the base of the wound that still needs to be removed this is very tough however and I was not able to debrided away last week. She really does not want me to perform any debridement today either due to how bad it hurt after I did this last time. With that being said I think we may be able to switch the dressing to something else to try to help with clearing this out a little better. 01/14/2019 upon evaluation today patient actually appears to be doing better with regard to her wound. She is showing signs of new granulation tissue and I do feel like that this is starting to improve. That is  good news. Fortunately there is no evidence of active infection at this point. No fevers, chills, nausea, vomiting, or diarrhea. 12/30-Patient returns at 2 weeks, she does complain of some discomfort at night in the right gluteal area, she is obviously active and stays on her feet and not in a chair during the day. Denies any fevers chills shakes. Complains that the area on the ulcer feels wet fairly often 02/11/2019 on evaluation today patient appears to be doing okay with regard to her wound. She has been tolerating the dressing changes without complication. Fortunately there is no signs of active infection at this time. No fevers, chills, nausea, vomiting, or diarrhea. She does note however that she is having issues financially with having to come as frequently even as every other week. She states that she is not sure she is going to be able to continue to do so. Fortunately there is no signs of anything doing poorly I think we may be able to spread this out for her in  order to help alleviate some of this especially in light of the fact that she does have home health coming out. 03/11/2019 upon evaluation today patient appears to be doing really roughly the same with regard to her wound. This is measuring close to where it was previous although may be slightly smaller. Fortunately there is no evidence of active infection at this time obviously. With that being said this is not healing quite as quickly as I would like to see therefore I may actually perform a repeat culture today to see if there is anything that shows up at this point. Fortunately there is no sign of active infection systemically for certain. The patient is somewhat frustrated with how long this is taking I completely understand your frustration. Nonetheless I explained that wounds like this often do take a very long time to heal. 04/08/2019 upon evaluation today patient appears to be doing well with regard to her wound. This is not  completely healed but does seem to be somewhat better. They have been using silver alginate at this point which is great news and overall the patient does seem to be making some progress. With that being said she did culture positive for MRSA unfortunately she is allergic to pretty much everything. I did want to discuss with her and she did mention today that she be willing to try the linezolid she is never taken this before. Hopefully it will not cause any problems for her. 06/03/2019 upon evaluation today patient appears to be doing well with regard to her wound. She does have a new wound on her left lower extremity that occurred as a result of swelling of her legs when she was in the hospital he took off of her fluid pills and this made some problem here. With that being said fortunately this seems to be getting better and the patient states she is really not worried about this she has been putting antibiotic ointment on this at home and covering this with a drain seen which seems to be doing fairly well now that she is not draining as much. At one point her daughter was having to change the dressings much more frequently. Fortunately that has improved. Her swelling is better which is also good news. The patient was in the hospital from 05/04/2019 through 05/08/2019 where she had IV antibiotic therapy with vancomycin due to a C. difficile infection. She is better in regard to the infection but still having a lot of weakness and tiredness in general. She did get the gentamicin and they have been using this at home as far as dressing changes are concerned. She tells me the hospital did not look at her wound at all during the time that she was there. 07/08/2019 upon evaluation today patient appears to be doing better in regard to her leg which is healed and that is excellent news. With regard to her wound in the right gluteal region this is still open and though it does not appear to be showing any signs of  infection there is some dried gentamicin cream in the base of the wound. I think we probably do not need the gentamicin any longer based on what I am seeing she has a lot of new skin and epithelization on the sidewalls of the wound and I think this is good to heal with somewhat of a tunnel which is okay as long as it heals. Again this is something we discussed in the past. 7/13; monthly follow-up. The area  that we are looking at is in the upper right buttock. The patient and her daughter told me that this was originally an abscess that was IandD or biopsied by dermatology. The wound circumference is inclement quite dramatically since last time in fact this would be much too small to put silver alginate in. 09/23/2019 on evaluation today patient appears to be doing better in regard to her wound in the right buttock region. This actually seems to be doing quite well. With that being said unfortunately she is having issues with her legs bilaterally today although she has more of a wound on the right and just leaking on the left. Objective Constitutional Well-nourished and well-hydrated in no acute distress. Vitals Time Taken: 2:27 PM, Height: 60 in, Weight: 115 lbs, BMI: 22.5, Temperature: 98.3 F, Pulse: 103 bpm, Respiratory Rate: 18 breaths/min, Blood Pressure: 125/70 mmHg. Respiratory normal breathing without difficulty. Psychiatric this patient is able to make decisions and demonstrates good insight into disease process. Alert and Oriented x 3. pleasant and cooperative. General Notes: On inspection patient's wounds currently showed signs again of healing in regard to the buttock region which is great news overall very pleased in that regard. In regard to her legs I really do think she would benefit from a Curlex and Coban wrap to try to keep some of the edema under control here. We really need to be sure that she could still wear shoes however as we do not want her to be off balance and risk for  falling again. Integumentary (Hair, Skin) Wound #1 status is Open. Original cause of wound was Gradually Appeared. The wound is located on the Right Gluteus. The wound measures 0cm length x 0cm width x 0cm depth; 0cm^2 area and 0cm^3 volume. There is no tunneling or undermining noted. There is a none present amount of drainage noted. The wound margin is distinct with the outline attached to the wound base. There is no granulation within the wound bed. There is no necrotic tissue within the wound bed. Wound #3 status is Open. Original cause of wound was Gradually Appeared. The wound is located on the Right,Anterior Lower Leg. The wound measures 2.6cm length x 2.5cm width x 0.1cm depth; 5.105cm^2 area and 0.511cm^3 volume. There is Fat Layer (Subcutaneous Tissue) exposed. There is no tunneling or undermining noted. There is a large amount of serosanguineous drainage noted. There is medium (34-66%) pink granulation within the wound bed. There is a medium (34-66%) amount of necrotic tissue within the wound bed including Adherent Slough. 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 - Wed. Nurse Visit: - Friday for rewrap Dressing Change Frequency: Do not change entire dressing for one week. Skin Barriers/Peri-Wound Care: Moisturizing lotion - to legs Wound Cleansing: May shower with protection. Primary Wound Dressing: Wound #3 Right,Anterior Lower Leg: Calcium Alginate with Silver Secondary Dressing: Zetuvit or Kerramax - left medial lower leg weeping area Wound #3 Right,Anterior Lower Leg: Dry Gauze ABD pad Edema Control: Kerlix and Coban -  Bilateral Avoid standing for long periods of time Elevate legs to the level of the heart or above for 30 minutes daily and/or when sitting, a frequency of: - throughout the day Exercise regularly Laboratory ordered were: Anerobic culture - right ant lower leg  1. I would recommend currently that we go ahead and initiate treatment with alginate to the right wound I did actually obtain a wound culture today as well we will see what this shows. 2. I am also can recommend that we go ahead and use a an absorptive dressing over the left leg where she has a tremendous amount of lymphedema fluid leaking. I do think that this will help catch some of this. 3. I would recommend that we see her back on Friday for a wrap change this is a Curlex and Coban wrap bilaterally. With that being said I think that because of the redness on the right leg I really would like to recheck this to make sure nothing is worsening. Obviously if there is anything worse we would need to discontinue the wraps. Nonetheless the patient is not can take any antibiotics at this point as she is allergic to so many she really does not do well at all with most antibiotic therapy. With that being said we will see what the culture shows as well once that returns. We will see patient back for reevaluation in 1 week here in the clinic. If anything worsens or changes patient will contact our office for additional recommendations. Electronic Signature(s) Signed: 09/23/2019 6:40:41 PM By: Baruch Gouty RN, BSN Signed: 09/23/2019 7:06:00 PM By: Worthy Keeler PA-C Previous Signature: 09/23/2019 3:52:47 PM Version By: Worthy Keeler PA-C Entered By: Baruch Gouty on 09/23/2019 15:56:07 -------------------------------------------------------------------------------- SuperBill Details Patient Name: Date of Service: Penny Perez, Penny Perez 09/23/2019 Medical Record Number: [id number] Patient Account Number: [id number] Date of Birth/Sex: Treating  RN: 10-28-1932 (86 y.o. 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 I10 Essential (primary) hypertension I87.2 Venous insufficiency (chronic) (peripheral) L97.822 Non-pressure chronic ulcer of other part of left lower leg with fat layer exposed Facility Procedures CPT4 Code: [id number] Description: [facility] VISIT-LEV 4 EST PT Modifier: Quantity: 1 Physician Procedures : CPT4 Code Description Modifier [id number] - WC PHYS LEVEL 3 - EST PT ICD-10 Diagnosis Description S31.819A Unspecified open wound of right buttock, initial encounter L98.418 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

## 2019-09-25 ENCOUNTER — Encounter (HOSPITAL_BASED_OUTPATIENT_CLINIC_OR_DEPARTMENT_OTHER): Payer: Medicare Other | Admitting: Internal Medicine

## 2019-09-25 DIAGNOSIS — T8189XA Other complications of procedures, not elsewhere classified, initial encounter: Secondary | ICD-10-CM | POA: Diagnosis not present

## 2019-09-27 LAB — AEROBIC CULTURE W GRAM STAIN (SUPERFICIAL SPECIMEN): Gram Stain: NONE SEEN

## 2019-09-27 NOTE — Progress Notes (Signed)
LANECIA, SLIVA ([id number]) Visit Report for 09/25/2019 SuperBill Details Patient Name: Date of Service: ADDALEE, [place] [facility] 09/25/2019 Medical Record Number: [id number] Patient Account Number: [id number] Date of Birth/Sex: Treating RN: [birth year] (86 y.o. 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 Electronic Signature(s) Signed: 09/25/2019 5:49:17 PM By: Carlene Coria RN Signed: 09/27/2019 7:39:18 AM By: Linton Ham MD Entered By: Carlene Coria on 09/25/2019 12:05:03

## 2019-09-30 ENCOUNTER — Other Ambulatory Visit: Payer: Self-pay

## 2019-09-30 ENCOUNTER — Encounter (HOSPITAL_BASED_OUTPATIENT_CLINIC_OR_DEPARTMENT_OTHER): Payer: Medicare Other | Attending: Physician Assistant | Admitting: Physician Assistant

## 2019-09-30 DIAGNOSIS — Z881 Allergy status to other antibiotic agents status: Secondary | ICD-10-CM | POA: Diagnosis not present

## 2019-09-30 DIAGNOSIS — Z880 Allergy status to penicillin: Secondary | ICD-10-CM | POA: Diagnosis not present

## 2019-09-30 DIAGNOSIS — S31819A Unspecified open wound of right buttock, initial encounter: Secondary | ICD-10-CM | POA: Insufficient documentation

## 2019-09-30 DIAGNOSIS — Z882 Allergy status to sulfonamides status: Secondary | ICD-10-CM | POA: Insufficient documentation

## 2019-09-30 DIAGNOSIS — M1990 Unspecified osteoarthritis, unspecified site: Secondary | ICD-10-CM | POA: Insufficient documentation

## 2019-09-30 DIAGNOSIS — I872 Venous insufficiency (chronic) (peripheral): Secondary | ICD-10-CM | POA: Insufficient documentation

## 2019-09-30 DIAGNOSIS — Z885 Allergy status to narcotic agent status: Secondary | ICD-10-CM | POA: Diagnosis not present

## 2019-09-30 DIAGNOSIS — I10 Essential (primary) hypertension: Secondary | ICD-10-CM | POA: Diagnosis not present

## 2019-09-30 DIAGNOSIS — L97822 Non-pressure chronic ulcer of other part of left lower leg with fat layer exposed: Secondary | ICD-10-CM | POA: Insufficient documentation

## 2019-09-30 DIAGNOSIS — J45909 Unspecified asthma, uncomplicated: Secondary | ICD-10-CM | POA: Insufficient documentation

## 2019-09-30 NOTE — Progress Notes (Addendum)
CEIL, RODERICK ([id number]) Visit Report for 09/30/2019 Chief Complaint Document Details Patient Name: Date of Service: BENTLEY, HARALSON [facility] 09/30/2019 2:45 PM Medical Record Number: [id number] Patient Account Number: [id number] Date of Birth/Sex: Treating RN: 1932-03-20 (86 y.o. [name] Primary Care Provider: [name] Other Clinician: Referring Provider: Treating Provider/Extender: [name] in Treatment: [phone] Information Obtained from: Patient Chief Complaint Right buttock ulcer and left leg ulcer Electronic Signature(s) Signed: 09/30/2019 2:54:35 PM By: Worthy Keeler PA-C Entered By: Worthy Keeler on 09/30/2019 14:54:34 -------------------------------------------------------------------------------- HPI Details Patient Name: Date of Service: JENNISE, BOTH 09/30/2019 2:45 PM Medical Record Number: [id number] Patient Account Number: [id number] Date of Birth/Sex: Treating RN: 06/12/1932 (86 y.o. [name] Primary Care Provider: [name] Other Clinician: Referring Provider: Treating Provider/Extender: [name] in Treatment: 26 History of Present Illness HPI Description: 12/03/2018 on evaluation today patient appears for initial inspection here in our clinic concerning an issue that she has been having a light having in the right gluteal region with an ulcer which initially she describes as having been a knot which when she went to see the dermatologist was biopsied and this was on 08/26/2018. Subsequently that revealed what appeared to be an inflamed ulcer with granulation tissue. Fortunately there was no signs of active infection based on what they saw on pathology. The patient states that since the biopsy she has been having a lot of pain. Fortunately there is no signs of active infection at this time no fever chills noted. She does have a history of hypertension but really no major medical  problems significant otherwise although she does have a lot of allergies including allergies to [facility] as well as antibiotics. In fact she has some ulcers in her mouth secondary to having been prescribed an antibiotic for the current ulcer. She states she has been having trouble sleeping due to the pain in the ulcer area. 12/10/2018 on evaluation today patient's wound actually showed signs of improvement as far as the loosening up of the necrotic tissue is concerned at this point. I do believe that that has done excellent at this point I think that we need to try and clear away some of the necrotic tissue in the base of the wound I discussed that with the patient as well. Fortunately she is not having any significant pain and states the Santyl did not burn although in the past she notes that it did cause her some trouble with the ulcer on her leg causing some burning. No fevers, chills, nausea, vomiting, or diarrhea. 12/17/2018 on evaluation today patient appears to be doing well with regard to her wound. This does not appear to be any more deep than it was after the last evaluation and debridement last week. With that being said there are some necrotic tissue on the surface of the wound which is can require some sharp debridement at this point. Fortunately there is no evidence of active infection at this time which is good news. No fevers, chills, nausea, vomiting, or diarrhea. 12/31/2018 on evaluation today patient appears to be doing a little better in regard to the overall appearance of her wound currently. She is tolerating the dressing changes without complication. Fortunately there is no signs of active infection at this time. With that being said she still has some necrotic tissue in the base of the wound that still needs to be removed this is very tough however and I was not able to  debrided away last week. She really does not want me to perform any debridement today either due to how bad it  hurt after I did this last time. With that being said I think we may be able to switch the dressing to something else to try to help with clearing this out a little better. 01/14/2019 upon evaluation today patient actually appears to be doing better with regard to her wound. She is showing signs of new granulation tissue and I do feel like that this is starting to improve. That is good news. Fortunately there is no evidence of active infection at this point. No fevers, chills, nausea, vomiting, or diarrhea. 12/30-Patient returns at 2 weeks, she does complain of some discomfort at night in the right gluteal area, she is obviously active and stays on her feet and not in a chair during the day. Denies any fevers chills shakes. Complains that the area on the ulcer feels wet fairly often 02/11/2019 on evaluation today patient appears to be doing okay with regard to her wound. She has been tolerating the dressing changes without complication. Fortunately there is no signs of active infection at this time. No fevers, chills, nausea, vomiting, or diarrhea. She does note however that she is having issues financially with having to come as frequently even as every other week. She states that she is not sure she is going to be able to continue to do so. Fortunately there is no signs of anything doing poorly I think we may be able to spread this out for her in order to help alleviate some of this especially in light of the fact that she does have home health coming out. 03/11/2019 upon evaluation today patient appears to be doing really roughly the same with regard to her wound. This is measuring close to where it was previous although may be slightly smaller. Fortunately there is no evidence of active infection at this time obviously. With that being said this is not healing quite as quickly as I would like to see therefore I may actually perform a repeat culture today to see if there is anything that shows up at  this point. Fortunately there is no sign of active infection systemically for certain. The patient is somewhat frustrated with how long this is taking I completely understand your frustration. Nonetheless I explained that wounds like this often do take a very long time to heal. 04/08/2019 upon evaluation today patient appears to be doing well with regard to her wound. This is not completely healed but does seem to be somewhat better. They have been using silver alginate at this point which is great news and overall the patient does seem to be making some progress. With that being said she did culture positive for MRSA unfortunately she is allergic to pretty much everything. I did want to discuss with her and she did mention today that she be willing to try the linezolid she is never taken this before. Hopefully it will not cause any problems for her. 06/03/2019 upon evaluation today patient appears to be doing well with regard to her wound. She does have a new wound on her left lower extremity that occurred as a result of swelling of her legs when she was in the hospital he took off of her fluid pills and this made some problem here. With that being said fortunately this seems to be getting better and the patient states she is really not worried about this she has been putting  antibiotic ointment on this at home and covering this with a drain seen which seems to be doing fairly well now that she is not draining as much. At one point her daughter was having to change the dressings much more frequently. Fortunately that has improved. Her swelling is better which is also good news. The patient was in the hospital from 05/04/2019 through 05/08/2019 where she had IV antibiotic therapy with vancomycin due to a C. difficile infection. She is better in regard to the infection but still having a lot of weakness and tiredness in general. She did get the gentamicin and they have been using this at home as far as dressing  changes are concerned. She tells me the hospital did not look at her wound at all during the time that she was there. 07/08/2019 upon evaluation today patient appears to be doing better in regard to her leg which is healed and that is excellent news. With regard to her wound in the right gluteal region this is still open and though it does not appear to be showing any signs of infection there is some dried gentamicin cream in the base of the wound. I think we probably do not need the gentamicin any longer based on what I am seeing she has a lot of new skin and epithelization on the sidewalls of the wound and I think this is good to heal with somewhat of a tunnel which is okay as long as it heals. Again this is something we discussed in the past. 7/13; monthly follow-up. The area that we are looking at is in the upper right buttock. The patient and her daughter told me that this was originally an abscess that was IandD or biopsied by dermatology. The wound circumference is inclement quite dramatically since last time in fact this would be much too small to put silver alginate in. 09/23/2019 on evaluation today patient appears to be doing better in regard to her wound in the right buttock region. This actually seems to be doing quite well. With that being said unfortunately she is having issues with her legs bilaterally today although she has more of a wound on the right and just leaking on the left. 09/30/2019 on evaluation today patient appears to be doing okay in regard to her wounds that she had a lot of trouble with the compression wrap since the last time I seen her. She did call in [place] actually called me to discuss as well what to do. Unfortunately she just has some much lower extremity edema that she is not able to tolerate the compression wraps even with a mild Kerlix and Coban wrap unfortunately. With that being said I do believe she needs some compression and her culture did come back showing  Pseudomonas so I do believe using topical gentamicin would also be beneficial over the wounds themselves to help with this. Fortunately she has no signs of systemic infection which is good news. Electronic Signature(s) Signed: 09/30/2019 5:47:52 PM By: Worthy Keeler PA-C Entered By: Worthy Keeler on 09/30/2019 17:47:52 -------------------------------------------------------------------------------- Physical Exam Details Patient Name: Date of Service: DANISE, DEHNE 09/30/2019 2:45 PM Medical Record Number: [id number] Patient Account Number: [id number] Date of Birth/Sex: Treating RN: 04/25/1932 (86 y.o. [name] Primary Care Provider: [name] Other Clinician: Referring Provider: Treating Provider/Extender: [name] in Treatment: 65 Constitutional Well-nourished and well-hydrated in no acute distress. Respiratory normal breathing without difficulty. Psychiatric this patient is able to make  decisions and demonstrates good insight into disease process. Alert and Oriented x 3. pleasant and cooperative. Notes Upon inspection patient's wound bed actually showed signs of good granulation and some epithelization at this point there was no need for sharp debridement. She does have a lot of edema and to be honest this is the 2-3+ pitting at this point. With that being said she really does not tolerate the compression wraps very well but I think she needs something. I think she would potentially benefit from at least an Ace wrap if nothing else. That was discussed with the patient today and to be honest I am not be sure she can do this on her own for that reason regular see about getting home health to see if they could help her out in this regard. The patient is appreciative of this. Electronic Signature(s) Signed: 09/30/2019 5:48:40 PM By: Worthy Keeler PA-C Entered By: Worthy Keeler on 09/30/2019  17:48:40 -------------------------------------------------------------------------------- Physician Orders Details Patient Name: Date of Service: LEVADA, BOWERSOX 09/30/2019 2:45 PM Medical Record Number: [id number] Patient Account Number: [id number] Date of Birth/Sex: Treating RN: 03/06/1932 (86 y.o. 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 - Wed. Return A Dressing Change Frequency Do not change entire dressing for one week. Skin Barriers/Peri-Wound Care Moisturizing lotion - to legs Wound Cleansing May shower with protection. Primary Wound Dressing Wound #3 Right,Anterior Lower Leg Calcium A lginate with Silver Other: - gentamicin ointment to wound bed under alginate Wound #4 Left Lower Leg Calcium A lginate with Silver Other: - gentamicin ointment to wound bed under alginate Secondary Dressing Wound #3 Right,Anterior Lower Leg Kerlix/Rolled Gauze Dry Gauze ABD pad Wound #4 Left Lower Leg Kerlix/Rolled Gauze Dry Gauze ABD pad Edema Control Avoid standing for long periods of time Elevate legs to the level of the heart or above for 30 minutes daily and/or when sitting, a frequency of: - throughout the day Exercise regularly Other: - ace wrap both legs from toes to tibial tuberosity Additional Orders / Instructions Other: - pt to call kidney doctor today to follow up for excessive swelling in  legs Home Health dmit to [place] [facility] - for wound care A Patient Medications llergies: ampicillin, Cipro, cephalexin, clarithromycin, codeine, doxycycline, erythromycin base, gatifloxacin, hydrocodone, penicillin, Sulfa (Sulfonamide A Antibiotics) Notifications Medication Indication Start End 09/30/2019 gentamicin DOSE topical 0.1 % cream - cream topical applied to open areas of the bilateral LEs as directed in the clinic with each dressing change Electronic Signature(s) Signed: 09/30/2019 6:44:49 PM By: Baruch Gouty RN, BSN Signed: 10/03/2019 11:53:24 AM By: Worthy Keeler PA-C Previous Signature: 09/30/2019 3:54:05 PM Version By: Worthy Keeler PA-C Entered By: Baruch Gouty on 09/30/2019 16:07:37 -------------------------------------------------------------------------------- Problem List Details Patient Name: Date of Service: DEYSI, SOLDO [facility] 09/30/2019 2:45 PM Medical Record Number: [id number] Patient Account Number: [id number] Date of Birth/Sex: Treating RN: 10/23/1932 (86 y.o. 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 12/03/2018 No Yes I10 Essential (primary) hypertension 12/03/2018 No Yes I87.2 Venous insufficiency (chronic) (peripheral) 06/03/2019 No Yes L97.822 Non-pressure chronic ulcer of other part of left lower leg with fat layer exposed5/05/2019 No Yes Inactive Problems Resolved Problems Electronic Signature(s) Signed: 09/30/2019 2:54:25 PM By: Worthy Keeler PA-C Entered By: Worthy Keeler on 09/30/2019  14:54:25 -------------------------------------------------------------------------------- Progress Note Details Patient Name: Date of Service: EMILEIGH, KELLETT [facility] 09/30/2019 2:45 PM Medical Record Number: [id number] Patient Account Number: [id number] Date of Birth/Sex: Treating RN: August 07, 1932 (86 y.o. [name] Primary Care Provider: [name] Other Clinician: Referring Provider: Treating Provider/Extender: [name] in Treatment: 18 Subjective Chief Complaint Information obtained from Patient Right buttock ulcer and left leg ulcer History of Present Illness (HPI) 12/03/2018 on evaluation today patient appears for initial inspection here in our clinic concerning an issue that she has been having a light having in the right gluteal region with an ulcer which initially she describes as having been a knot which when she went to see the dermatologist was biopsied and this was on 08/26/2018. Subsequently that revealed what appeared to be an inflamed ulcer with granulation tissue. Fortunately there was no signs of active infection based on what they saw on pathology. The patient states that since the biopsy she has been having a lot of pain. Fortunately there is no signs of active infection at this time no fever chills noted. She does have a history of hypertension but really no major medical problems significant otherwise although she does have a lot of allergies including allergies to [facility] as well as antibiotics. In fact she has some ulcers in her mouth secondary to having been prescribed an antibiotic for the current ulcer. She states she has been having trouble sleeping due to the pain in the ulcer area. 12/10/2018 on evaluation today patient's wound actually showed signs of improvement as far as the loosening up of the necrotic tissue is concerned at this point. I do believe that that has done excellent at this point I think that we need to try and  clear away some of the necrotic tissue in the base of the wound I discussed that with the patient as well. Fortunately she is not having any significant pain and states the Santyl did not burn although in the past she notes that it did cause her some trouble with the ulcer on her leg causing some burning. No fevers, chills, nausea, vomiting, or diarrhea. 12/17/2018 on evaluation today patient appears to be doing well with regard to her wound. This does not appear to be any more deep than it was after the last evaluation and debridement last week. With that being said there are some necrotic tissue on the surface of the wound which is can require some sharp debridement at this point. Fortunately there is no evidence of active infection at this time which is good news. No fevers, chills, nausea, vomiting, or diarrhea. 12/31/2018 on evaluation today patient appears to be doing a little better in regard to the overall appearance of her wound currently. She is tolerating the dressing changes without complication. Fortunately there is no signs of active infection at this time. With that being said she still has some necrotic tissue in the base of the wound that still needs to be removed this is very tough  however and I was not able to debrided away last week. She really does not want me to perform any debridement today either due to how bad it hurt after I did this last time. With that being said I think we may be able to switch the dressing to something else to try to help with clearing this out a little better. 01/14/2019 upon evaluation today patient actually appears to be doing better with regard to her wound. She is showing signs of new granulation tissue and I do feel like that this is starting to improve. That is good news. Fortunately there is no evidence of active infection at this point. No fevers, chills, nausea, vomiting, or diarrhea. 12/30-Patient returns at 2 weeks, she does complain of some  discomfort at night in the right gluteal area, she is obviously active and stays on her feet and not in a chair during the day. Denies any fevers chills shakes. Complains that the area on the ulcer feels wet fairly often 02/11/2019 on evaluation today patient appears to be doing okay with regard to her wound. She has been tolerating the dressing changes without complication. Fortunately there is no signs of active infection at this time. No fevers, chills, nausea, vomiting, or diarrhea. She does note however that she is having issues financially with having to come as frequently even as every other week. She states that she is not sure she is going to be able to continue to do so. Fortunately there is no signs of anything doing poorly I think we may be able to spread this out for her in order to help alleviate some of this especially in light of the fact that she does have home health coming out. 03/11/2019 upon evaluation today patient appears to be doing really roughly the same with regard to her wound. This is measuring close to where it was previous although may be slightly smaller. Fortunately there is no evidence of active infection at this time obviously. With that being said this is not healing quite as quickly as I would like to see therefore I may actually perform a repeat culture today to see if there is anything that shows up at this point. Fortunately there is no sign of active infection systemically for certain. The patient is somewhat frustrated with how long this is taking I completely understand your frustration. Nonetheless I explained that wounds like this often do take a very long time to heal. 04/08/2019 upon evaluation today patient appears to be doing well with regard to her wound. This is not completely healed but does seem to be somewhat better. They have been using silver alginate at this point which is great news and overall the patient does seem to be making some progress. With  that being said she did culture positive for MRSA unfortunately she is allergic to pretty much everything. I did want to discuss with her and she did mention today that she be willing to try the linezolid she is never taken this before. Hopefully it will not cause any problems for her. 06/03/2019 upon evaluation today patient appears to be doing well with regard to her wound. She does have a new wound on her left lower extremity that occurred as a result of swelling of her legs when she was in the hospital he took off of her fluid pills and this made some problem here. With that being said fortunately this seems to be getting better and the patient states she is really not  worried about this she has been putting antibiotic ointment on this at home and covering this with a drain seen which seems to be doing fairly well now that she is not draining as much. At one point her daughter was having to change the dressings much more frequently. Fortunately that has improved. Her swelling is better which is also good news. The patient was in the hospital from 05/04/2019 through 05/08/2019 where she had IV antibiotic therapy with vancomycin due to a C. difficile infection. She is better in regard to the infection but still having a lot of weakness and tiredness in general. She did get the gentamicin and they have been using this at home as far as dressing changes are concerned. She tells me the hospital did not look at her wound at all during the time that she was there. 07/08/2019 upon evaluation today patient appears to be doing better in regard to her leg which is healed and that is excellent news. With regard to her wound in the right gluteal region this is still open and though it does not appear to be showing any signs of infection there is some dried gentamicin cream in the base of the wound. I think we probably do not need the gentamicin any longer based on what I am seeing she has a lot of new skin and  epithelization on the sidewalls of the wound and I think this is good to heal with somewhat of a tunnel which is okay as long as it heals. Again this is something we discussed in the past. 7/13; monthly follow-up. The area that we are looking at is in the upper right buttock. The patient and her daughter told me that this was originally an abscess that was IandD or biopsied by dermatology. The wound circumference is inclement quite dramatically since last time in fact this would be much too small to put silver alginate in. 09/23/2019 on evaluation today patient appears to be doing better in regard to her wound in the right buttock region. This actually seems to be doing quite well. With that being said unfortunately she is having issues with her legs bilaterally today although she has more of a wound on the right and just leaking on the left. 09/30/2019 on evaluation today patient appears to be doing okay in regard to her wounds that she had a lot of trouble with the compression wrap since the last time I seen her. She did call in [place] actually called me to discuss as well what to do. Unfortunately she just has some much lower extremity edema that she is not able to tolerate the compression wraps even with a mild Kerlix and Coban wrap unfortunately. With that being said I do believe she needs some compression and her culture did come back showing Pseudomonas so I do believe using topical gentamicin would also be beneficial over the wounds themselves to help with this. Fortunately she has no signs of systemic infection which is good news. Objective Constitutional Well-nourished and well-hydrated in no acute distress. Vitals Time Taken: 3:18 PM, Height: 60 in, Weight: 115 lbs, BMI: 22.5, Temperature: 99.1 F, Pulse: 92 bpm, Respiratory Rate: 18 breaths/min, Blood Pressure: 154/79 mmHg. Respiratory normal breathing without difficulty. Psychiatric this patient is able to make decisions and  demonstrates good insight into disease process. Alert and Oriented x 3. pleasant and cooperative. General Notes: Upon inspection patient's wound bed actually showed signs of good granulation and some epithelization at this point there was no  need for sharp debridement. She does have a lot of edema and to be honest this is the 2-3+ pitting at this point. With that being said she really does not tolerate the compression wraps very well but I think she needs something. I think she would potentially benefit from at least an Ace wrap if nothing else. That was discussed with the patient today and to be honest I am not be sure she can do this on her own for that reason regular see about getting home health to see if they could help her out in this regard. The patient is appreciative of this. Integumentary (Hair, Skin) Wound #3 status is Open. Original cause of wound was Gradually Appeared. The wound is located on the Right,Anterior Lower Leg. The wound measures 2cm length x 3cm width x 0.1cm depth; 4.712cm^2 area and 0.471cm^3 volume. There is Fat Layer (Subcutaneous Tissue) exposed. There is no tunneling or undermining noted. There is a medium amount of serosanguineous drainage noted. There is large (67-100%) red granulation within the wound bed. There is a small (1-33%) amount of necrotic tissue within the wound bed including Adherent Slough. Wound #4 status is Open. Original cause of wound was Trauma. The wound is located on the Left Lower Leg. The wound measures 4cm length x 3cm width x 0.1cm depth; 9.425cm^2 area and 0.942cm^3 volume. There is Fat Layer (Subcutaneous Tissue) exposed. There is no tunneling or undermining noted. There is a medium amount of serosanguineous drainage noted. There is small (1-33%) red granulation within the wound bed. There is a large (67-100%) amount of necrotic tissue within the wound bed including Adherent Slough. 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 - Wed. Dressing Change Frequency: Do not change entire dressing for one week. Skin Barriers/Peri-Wound Care: Moisturizing lotion - to legs Wound Cleansing: May shower with protection. Primary Wound Dressing: Wound #3 Right,Anterior Lower Leg: Calcium Alginate with Silver Other: - gentamicin ointment to wound bed under alginate Wound #4 Left Lower Leg: Calcium Alginate with Silver Other: - gentamicin ointment to wound bed under alginate Secondary Dressing: Wound #3 Right,Anterior Lower Leg: Kerlix/Rolled Gauze Dry Gauze ABD pad Wound #4 Left Lower Leg: Kerlix/Rolled Gauze Dry Gauze ABD pad Edema Control: Avoid standing for long periods of time Elevate legs to the level of the heart or above for 30 minutes daily and/or when sitting, a frequency of: - throughout the day Exercise regularly Other: - ace wrap both legs from toes to tibial tuberosity Additional Orders / Instructions: Other: - pt to call kidney doctor today to follow up for excessive swelling in legs Home Health: Admit to [facility] - for wound care The following medication(s) was prescribed: gentamicin topical 0.1 % cream cream topical applied to open areas of the bilateral LEs as directed in the clinic with each dressing change starting 09/30/2019 1. I would recommend at this time that we go ahead and initiate treatment with home health at this time specifically with regard to using gentamicin cream followed by a silver alginate dressing over top. 2. We will use well gauze to secure in place after applying the dressings as well as an ABD pad and then secure with an Ace wrap  lightly just to hold everything  in place and provide some mild compression. 3. The patient needs to elevate her legs is much as possible and I did advise that she needs to get in touch with her kidney specialist ASAP in order to see if there is anything they can do to help with her lower extremity edema which again is quite significant. We will see patient back for reevaluation in 1 week here in the clinic. If anything worsens or changes patient will contact our office for additional recommendations. Electronic Signature(s) Signed: 09/30/2019 5:50:20 PM By: Worthy Keeler PA-C Entered By: Worthy Keeler on 09/30/2019 17:50:20 -------------------------------------------------------------------------------- SuperBill Details Patient Name: Date of Service: SHYANA, KULAKOWSKI 09/30/2019 Medical Record Number: [id number] Patient Account Number: [id number] Date of Birth/Sex: Treating RN: Dec 07, 1932 (86 y.o. 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 I10 Essential (primary) hypertension I87.2 Venous insufficiency (chronic) (peripheral) L97.822 Non-pressure chronic ulcer of other part of left lower leg with fat layer exposed Facility Procedures CPT4 Code: 91660600 Description: 99214 - WOUND CARE VISIT-LEV 4 EST PT Modifier: Quantity: 1 Physician Procedures : CPT4 Code Description Modifier [id number] - WC PHYS LEVEL 3 - EST PT ICD-10 Diagnosis Description F42.395V Unspecified open wound of right buttock, initial encounter L98.418 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

## 2019-09-30 NOTE — Progress Notes (Addendum)
VERDELL, KINCANNON ([id number]) Visit Report for 09/30/2019 Arrival Information Details Patient Name: Date of Service: AHRIA, SLAPPEY [facility] 09/30/2019 2:45 PM Medical Record Number: [id number] Patient Account Number: [id number] Date of Birth/Sex: Treating RN: May 23, 1932 (86 y.o. [name] Primary Care Hart Haas: [name] Other Clinician: Referring Kourtlynn Trevor: Treating Marlen Koman/Extender: [name] in Treatment: 60 Visit Information History Since Last Visit All ordered tests and consults were completed: No Patient Arrived: Ambulatory Added or deleted any medications: No Arrival Time: 15:18 Any new allergies or adverse reactions: No Accompanied By: self Had a fall or experienced change in No Transfer Assistance: None activities of daily living that may affect Patient Identification Verified: Yes risk of falls: Secondary Verification Process Completed: Yes Signs or symptoms of abuse/neglect since last visito No Patient Requires Transmission-Based Precautions: No Hospitalized since last visit: No Patient Has Alerts: No Implantable device outside of the clinic excluding No cellular tissue based products placed in the center since last visit: Has Dressing in Place as Prescribed: Yes Has Compression in Place as Prescribed: Yes Pain Present Now: Yes Electronic Signature(s) Signed: 09/30/2019 5:45:10 PM By: Carlene Coria RN Entered By: Carlene Coria on 09/30/2019 15:18:32 -------------------------------------------------------------------------------- Clinic Level of Care Assessment Details Patient Name: Date of Service: TAKINA, BUSSER 09/30/2019 2:45 PM Medical Record Number: [id number] Patient Account Number: [id number] Date of Birth/Sex: Treating RN: [birth year] (86 y.o. [name] Primary Care Hetty Linhart: [name] Other Clinician: Referring Lemya Greenwell: Treating Christine Morton/Extender: [name] in Treatment:  43 Clinic Level of Care Assessment Items TOOL 4 Quantity Score []  - 0 Use when only an EandM is performed on FOLLOW-UP visit ASSESSMENTS - Nursing Assessment / Reassessment X- 1 10 Reassessment of Co-morbidities (includes updates in patient status) X- 1 5 Reassessment of Adherence to Treatment Plan ASSESSMENTS - Wound and Skin A ssessment / Reassessment []  - 0 Simple Wound Assessment / Reassessment - one wound X- 2 5 Complex Wound Assessment / Reassessment - multiple wounds []  - 0 Dermatologic / Skin Assessment (not related to wound area) ASSESSMENTS - Focused Assessment X- 2 5 Circumferential Edema Measurements - multi extremities []  - 0 Nutritional Assessment / Counseling / Intervention X- 1 5 Lower Extremity Assessment (monofilament, tuning fork, pulses) []  - 0 Peripheral Arterial Disease Assessment (using hand held doppler) ASSESSMENTS - Ostomy and/or Continence Assessment and Care []  - 0 Incontinence Assessment and Management []  - 0 Ostomy Care Assessment and Management (repouching, etc.) PROCESS - Coordination of Care X - Simple Patient / Family Education for ongoing care 1 15 []  - 0 Complex (extensive) Patient / Family Education for ongoing care X- 1 10 Staff obtains Programmer, systems, Records, T Results / Process Orders est X- 1 10 Staff telephones HHA, Nursing Homes / Clarify orders / etc []  - 0 Routine Transfer to another Facility (non-emergent condition) []  - 0 Routine Hospital Admission (non-emergent condition) []  - 0 New Admissions / Biomedical engineer / Ordering NPWT Apligraf, etc. , []  - 0 Emergency Hospital Admission (emergent condition) X- 1 10 Simple Discharge Coordination []  - 0 Complex (extensive) Discharge Coordination PROCESS - Special Needs []  - 0 Pediatric / Minor Patient Management []  - 0 Isolation Patient Management []  - 0 Hearing / Language / Visual special needs []  - 0 Assessment of Community assistance (transportation, D/C  planning, etc.) []  - 0 Additional assistance / Altered mentation []  - 0 Support Surface(s) Assessment (bed, cushion, seat, etc.) INTERVENTIONS - Wound Cleansing / Measurement []  - 0 Simple Wound Cleansing -  one wound X- 2 5 Complex Wound Cleansing - multiple wounds X- 1 5 Wound Imaging (photographs - any number of wounds) []  - 0 Wound Tracing (instead of photographs) []  - 0 Simple Wound Measurement - one wound X- 2 5 Complex Wound Measurement - multiple wounds INTERVENTIONS - Wound Dressings X - Small Wound Dressing one or multiple wounds 2 10 []  - 0 Medium Wound Dressing one or multiple wounds []  - 0 Large Wound Dressing one or multiple wounds X- 1 5 Application of Medications - topical []  - 0 Application of Medications - injection INTERVENTIONS - Miscellaneous []  - 0 External ear exam []  - 0 Specimen Collection (cultures, biopsies, blood, body fluids, etc.) []  - 0 Specimen(s) / Culture(s) sent or taken to Lab for analysis []  - 0 Patient Transfer (multiple staff / Civil Service fast streamer / Similar devices) []  - 0 Simple Staple / Suture removal (25 or less) []  - 0 Complex Staple / Suture removal (26 or more) []  - 0 Hypo / Hyperglycemic Management (close monitor of Blood Glucose) []  - 0 Ankle / Brachial Index (ABI) - do not check if billed separately X- 1 5 Vital Signs Has the patient been seen at the hospital within the last three years: Yes Total Score: 140 Level Of Care: New/Established - Level 4 Electronic Signature(s) Signed: 09/30/2019 6:44:49 PM By: Baruch Gouty RN, BSN Entered By: Baruch Gouty on 09/30/2019 17:40:15 -------------------------------------------------------------------------------- Encounter Discharge Information Details Patient Name: Date of Service: LATISIA, HILAIRE BETH 09/30/2019 2:45 PM Medical Record Number: [id number] Patient Account Number: [id number] Date of Birth/Sex: Treating RN: [birth year] (86 y.o. [name] Primary Care Ryden Wainer:  [name] Other Clinician: Referring Mercie Balsley: Treating Torie Priebe/Extender: [name] in Treatment: [phone] Encounter Discharge Information Items Discharge Condition: Stable Ambulatory Status: Walker Discharge Destination: Home Transportation: Private Auto Accompanied By: self Schedule Follow-up Appointment: Yes Clinical Summary of Care: Patient Declined Electronic Signature(s) Signed: 09/30/2019 5:45:10 PM By: Carlene Coria RN Entered By: Carlene Coria on 09/30/2019 16:36:21 -------------------------------------------------------------------------------- Lower Extremity Assessment Details Patient Name: Date of Service: KALYNA, PAOLELLA 09/30/2019 2:45 PM Medical Record Number: [id number] Patient Account Number: [id number] Date of Birth/Sex: Treating RN: [birth year] (86 y.o. [name] Primary Care Keliah Harned: [name] Other Clinician: Referring Collyn Selk: Treating Reanna Scoggin/Extender: [name] in Treatment: 43 Edema Assessment Assessed: [name]: No] [name]: No] Edema: [Left: No] [Right: Yes] Calf Left: Right: Point of Measurement: cm From Medial Instep 36 cm 36 cm Ankle Left: Right: Point of Measurement: cm From Medial Instep 22 cm 23 cm Electronic Signature(s) Signed: 09/30/2019 5:45:10 PM By: Carlene Coria RN Entered By: Carlene Coria on 09/30/2019 15:22:46 -------------------------------------------------------------------------------- [place] Details Patient Name: Date of Service: IEISHA, GAO [facility] 09/30/2019 2:45 PM Medical Record Number: [id number] Patient Account Number: [id number] Date of Birth/Sex: Treating RN: December 11, 1932 (86 y.o. [name] Primary Care Juliano Mceachin: [name] Other Clinician: Referring Lenard Kampf: Treating Leisl Spurrier/Extender: [name] in Treatment: 95 Active Inactive Venous Leg Ulcer Nursing Diagnoses: Knowledge deficit  related to disease process and management Potential for venous Insuffiency (use before diagnosis confirmed) Goals: Patient will maintain optimal edema control Date Initiated: 09/23/2019 Target Resolution Date: 10/21/2019 Goal Status: Active Interventions: Assess peripheral edema status every visit. Compression as ordered Provide education on venous insufficiency Treatment Activities: Therapeutic compression applied : 09/23/2019 Notes: Wound/Skin Impairment Nursing Diagnoses: Impaired tissue integrity Knowledge deficit related to ulceration/compromised skin integrity Goals: Patient/caregiver will verbalize understanding of skin care regimen Date Initiated: 12/03/2018  Target Resolution Date: 10/28/2019 Goal Status: Active Ulcer/skin breakdown will have a volume reduction of 30% by week 4 Date Initiated: 12/03/2018 Date Inactivated: 12/31/2018 Target Resolution Date: 12/31/2018 Goal Status: Unmet Unmet Reason: tunnel unroofed Ulcer/skin breakdown will have a volume reduction of 50% by week 8 Date Initiated: 12/31/2018 Date Inactivated: 01/28/2019 Target Resolution Date: 01/28/2019 Goal Status: Unmet Unmet Reason: multiple comorbidities Interventions: Assess patient/caregiver ability to obtain necessary supplies Assess patient/caregiver ability to perform ulcer/skin care regimen upon admission and as needed Assess ulceration(s) every visit Treatment Activities: Skin care regimen initiated : 12/03/2018 Topical wound management initiated : 12/03/2018 Notes: Electronic Signature(s) Signed: 09/30/2019 6:44:49 PM By: Baruch Gouty RN, BSN Entered By: Baruch Gouty on 09/30/2019 15:56:03 -------------------------------------------------------------------------------- Pain Assessment Details Patient Name: Date of Service: ELLI, GROESBECK 09/30/2019 2:45 PM Medical Record Number: [id number] Patient Account Number: [id number] Date of Birth/Sex: Treating RN: [birth year] (86 y.o. [name] Primary Care Mayda Shippee: [name] Other Clinician: Referring Kelley Knoth: Treating Shivan Hodes/Extender: [name] in Treatment: [phone] Active Problems Location of Pain Severity and Description of Pain Patient Has Paino Yes Site Locations With Dressing Change: Yes Duration of the Pain. Constant / Intermittento Constant Rate the pain. Current Pain Level: 3 Worst Pain Level: 6 Least Pain Level: 1 Tolerable Pain Level: 5 Character of Pain Describe the Pain: Aching, Burning Pain Management and Medication Current Pain Management: Medication: No Cold Application: No Rest: Yes Massage: No Activity: No T.E.N.S.: No Heat Application: No Leg drop or elevation: No Is the Current Pain Management Adequate: Inadequate How does your wound impact your activities of daily livingo Sleep: Yes Bathing: No Appetite: No Relationship With Others: No Bladder Continence: No Emotions: No Bowel Continence: No Work: No Toileting: No Drive: No Dressing: No Hobbies: No Electronic Signature(s) Signed: 09/30/2019 5:45:10 PM By: Carlene Coria RN Entered By: Carlene Coria on 09/30/2019 15:21:10 -------------------------------------------------------------------------------- Patient/Caregiver Education Details Patient Name: Date of Service: ANGELLY, SPEARING BETH 9/1/2021andnbsp2:45 PM Medical Record Number: [id number] Patient Account Number: [id number] Date of Birth/Gender: Treating RN: 1932-02-16 (86 y.o. [name] Primary Care Physician: [name] Other Clinician: Referring Physician: Treating Physician/Extender: [name] in Treatment: 38 Education Assessment Education Provided To: Patient Education Topics Provided Venous: Methods: Explain/Verbal Responses: Reinforcements needed, State content correctly Wound/Skin Impairment: Methods: Explain/Verbal Responses: Reinforcements needed, State content  correctly Electronic Signature(s) Signed: 09/30/2019 6:44:49 PM By: Baruch Gouty RN, BSN Entered By: Baruch Gouty on 09/30/2019 15:56:44 -------------------------------------------------------------------------------- Wound Assessment Details Patient Name: Date of Service: RUEY, STORER 09/30/2019 2:45 PM Medical Record Number: [id number] Patient Account Number: [id number] Date of Birth/Sex: Treating RN: 04-15-1932 (86 y.o. [name] Primary Care Denicia Pagliarulo: [name] Other Clinician: Referring Nillie Bartolotta: Treating Aryan Bello/Extender: [name] in Treatment: 43 Wound Status Wound Number: 3 Primary Etiology: Lymphedema Wound Location: Right, Anterior Lower Leg Wound Status: Open Wounding Event: Gradually Appeared Comorbid History: Cataracts, Asthma, Hypertension, Osteoarthritis Date Acquired: 09/21/2019 Weeks Of Treatment: 1 Clustered Wound: No Photos Photo Uploaded By: Mikeal Hawthorne on 10/01/2019 11:08:58 Wound Measurements Length: (cm) 2 Width: (cm) 3 Depth: (cm) 0.1 Area: (cm) 4.712 Volume: (cm) 0.471 % Reduction in Area: 7.7% % Reduction in Volume: 7.8% Epithelialization: None Tunneling: No Undermining: No Wound Description Classification: Full Thickness Without Exposed Support Structures Exudate Amount: Medium Exudate Type: Serosanguineous Exudate Color: red, brown Foul Odor After Cleansing: No Slough/Fibrino Yes Wound Bed Granulation Amount: Large (67-100%) Exposed Structure Granulation Quality: Red Fascia Exposed: No Necrotic Amount: Small (1-33%)  Fat Layer (Subcutaneous Tissue) Exposed: Yes Necrotic Quality: Adherent Slough Tendon Exposed: No Muscle Exposed: No Joint Exposed: No Bone Exposed: No Electronic Signature(s) Signed: 09/30/2019 5:45:10 PM By: Carlene Coria RN Entered By: Carlene Coria on 09/30/2019 15:28:30 -------------------------------------------------------------------------------- Wound  Assessment Details Patient Name: Date of Service: MC, HOLLEN 09/30/2019 2:45 PM Medical Record Number: [id number] Patient Account Number: [id number] Date of Birth/Sex: Treating RN: 12-23-1932 (86 y.o. [name] Primary Care Elanah Osmanovic: [name] Other Clinician: Referring Daemyn Gariepy: Treating Kamisha Ell/Extender: [name] in Treatment: 43 Wound Status Wound Number: 4 Primary Etiology: Skin Tear Wound Location: Left Lower Leg Wound Status: Open Wounding Event: Trauma Comorbid History: Cataracts, Asthma, Hypertension, Osteoarthritis Date Acquired: 09/27/2019 Weeks Of Treatment: 0 Clustered Wound: No Photos Wound Measurements Length: (cm) 4 Width: (cm) 3 Depth: (cm) 0.1 Area: (cm) 9.425 Volume: (cm) 0.942 % Reduction in Area: 0% % Reduction in Volume: 0% Epithelialization: None Tunneling: No Undermining: No Wound Description Classification: Full Thickness Without Exposed Support Structures Exudate Amount: Medium Exudate Type: Serosanguineous Exudate Color: red, brown Wound Bed Granulation Amount: Small (1-33%) Granulation Quality: Red Necrotic Amount: Large (67-100%) Necrotic Quality: Adherent Slough Foul Odor After Cleansing: No Slough/Fibrino Yes Exposed Structure Fascia Exposed: No Fat Layer (Subcutaneous Tissue) Exposed: Yes Tendon Exposed: No Muscle Exposed: No Joint Exposed: No Bone Exposed: No Electronic Signature(s) Signed: 10/01/2019 3:13:22 PM By: Mikeal Hawthorne EMT/HBOT/SD Signed: 10/19/2019 1:16:40 PM By: Carlene Coria RN Previous Signature: 09/30/2019 5:45:10 PM Version By: Carlene Coria RN Entered By: Mikeal Hawthorne on 10/01/2019 11:10:53 -------------------------------------------------------------------------------- Vitals Details Patient Name: Date of Service: LAVERE, SHINSKY BETH 09/30/2019 2:45 PM Medical Record Number: [id number] Patient Account Number: [id number] Date of Birth/Sex: Treating  RN: 1932-03-31 (86 y.o. [name] Primary Care Chidubem Chaires: [name] Other Clinician: Referring Breeann Reposa: Treating Nina Mondor/Extender: [name] in Treatment: 43 Vital Signs Time Taken: 15:18 Temperature (F): 99.1 Height (in): 60 Pulse (bpm): 92 Weight (lbs): 115 Respiratory Rate (breaths/min): 18 Body Mass Index (BMI): 22.5 Blood Pressure (mmHg): 154/79 Reference Range: 80 - 120 mg / dl Electronic Signature(s) Signed: 09/30/2019 5:45:10 PM By: Carlene Coria RN Entered By: Carlene Coria on 09/30/2019 15:19:08

## 2019-09-30 NOTE — Progress Notes (Signed)
Penny Perez ([id number]) Visit Report for 09/25/2019 Arrival Information Details Patient Name: Date of Service: Penny Perez, Penny Perez [facility] 09/25/2019 11:15 A M Medical Record Number: [id number] Patient Account Number: [id number] Date of Birth/Sex: Treating RN: [birth year] (86 y.o. Clearnce Sorrel Primary Care Treyton Slimp: [name] Other Clinician: Referring Naveen Lorusso: Treating Asucena Galer/Extender: Wende Mott in Treatment: 42 Visit Information History Since Last Visit Added or deleted any medications: No Patient Arrived: Gilford Rile Any new allergies or adverse reactions: No Arrival Time: 11:39 Had a fall or experienced change in No Accompanied By: daughter activities of daily living that may affect Transfer Assistance: None risk of falls: Patient Identification Verified: Yes Signs or symptoms of abuse/neglect since last visito No Secondary Verification Process Completed: Yes Hospitalized since last visit: No Patient Requires Transmission-Based Precautions: No Implantable device outside of the clinic excluding No Patient Has Alerts: No cellular tissue based products placed in the center since last visit: Has Dressing in Place as Prescribed: Yes Pain Present Now: Yes Electronic Signature(s) Signed: 09/30/2019 11:00:36 AM By: Sandre Kitty Entered By: Sandre Kitty on 09/25/2019 11:39:56 -------------------------------------------------------------------------------- Compression Therapy Details Patient Name: Date of Service: Penny Perez 09/25/2019 11:15 A M Medical Record Number: [id number] Patient Account Number: [id number] Date of Birth/Sex: Treating RN: 15-May-1932 (86 y.o. [name] Primary Care Davie Sagona: [name] Other Clinician: Referring Shandreka Dante: Treating Markeria Goetsch/Extender: Wende Mott in Treatment: 42 Compression Therapy Performed for Wound Assessment: Wound #3 Right,Anterior Lower  Leg Performed By: Jake Church, RN Compression Type: Double Layer Electronic Signature(s) Signed: 09/25/2019 5:49:17 PM By: Carlene Coria RN Entered By: Carlene Coria on 09/25/2019 12:03:24 -------------------------------------------------------------------------------- Encounter Discharge Information Details Patient Name: Date of Service: Penny Perez 09/25/2019 11:15 A M Medical Record Number: [id number] Patient Account Number: [id number] Date of Birth/Sex: Treating RN: Sep 25, 1932 (86 y.o. [name] Primary Care Chukwuemeka Artola: Other Clinician: [name] Referring Soliana Kitko: Treating Brennley Curtice/Extender: Wende Mott in Treatment: 42 Encounter Discharge Information Items Discharge Condition: Stable Ambulatory Status: Walker Discharge Destination: Home Transportation: Private Auto Accompanied By: daughter Schedule Follow-up Appointment: Yes Clinical Summary of Care: Patient Declined Electronic Signature(s) Signed: 09/25/2019 5:49:17 PM By: Carlene Coria RN Entered By: Carlene Coria on 09/25/2019 12:04:49 -------------------------------------------------------------------------------- Patient/Caregiver Education Details Patient Name: Date of Service: [place] Perez 8/27/2021andnbsp11:15 A M Medical Record Number: [id number] Patient Account Number: [id number] Date of Birth/Gender: Treating RN: 1932-04-22 (86 y.o. [name] Primary Care Physician: [name] Other Clinician: Referring Physician: Treating Physician/Extender: Wende Mott in Treatment: 70 Education Assessment Education Provided To: Patient Education Topics Provided Venous: Methods: Explain/Verbal Responses: State content correctly Electronic Signature(s) Signed: 09/25/2019 5:49:17 PM By: Carlene Coria RN Entered By: Carlene Coria on 09/25/2019  12:04:08 -------------------------------------------------------------------------------- Wound Assessment Details Patient Name: Date of Service: Penny Perez 09/25/2019 11:15 A M Medical Record Number: [id number] Patient Account Number: [id number] Date of Birth/Sex: Treating RN: [birth year] (86 y.o. Clearnce Sorrel Primary Care Andria Head: [name] Other Clinician: Referring Tityana Pagan: Treating Pasha Gadison/Extender: Wende Mott in Treatment: 42 Wound Status Wound Number: 3 Primary Etiology: Lymphedema Wound Location: Right, Anterior Lower Leg Wound Status: Open Wounding Event: Gradually Appeared Date Acquired: 09/21/2019 Weeks Of Treatment: 0 Clustered Wound: No Wound Measurements Length: (cm) 2.6 Width: (cm) 2.5 Depth: (cm) 0.1 Area: (cm) 5.105 Volume: (cm) 0.511 % Reduction in Area: 0% % Reduction in Volume: 0% Wound Description Classification: Full Thickness Without Exposed Support Structu res Treatment Notes Wound #3 (Right, Anterior Lower Leg) 1. Cleanse  With Wound Cleanser 3. Primary Dressing Applied Calcium Alginate Ag 6. Support Layer Applied Kerlix/Coban Notes Horticulturist, commercial) Signed: 09/25/2019 5:56:33 PM By: Kela Millin Signed: 09/30/2019 11:00:36 AM By: Sandre Kitty Entered By: Sandre Kitty on 09/25/2019 11:40:37 -------------------------------------------------------------------------------- Vitals Details Patient Name: Date of Service: Penny Perez 09/25/2019 11:15 A M Medical Record Number: [id number] Patient Account Number: [id number] Date of Birth/Sex: Treating RN: Sep 09, 1932 (86 y.o. Clearnce Sorrel Primary Care Anajulia Leyendecker: [name] Other Clinician: Referring Brandilyn Nanninga: Treating Maralyn Witherell/Extender: Wende Mott in Treatment: 42 Vital Signs Time Taken: 11:40 Temperature (F): 98.6 Height (in): 60 Pulse (bpm): 88 Weight (lbs):  115 Respiratory Rate (breaths/min): 18 Body Mass Index (BMI): 22.5 Blood Pressure (mmHg): 137/64 Reference Range: 80 - 120 mg / dl Electronic Signature(s) Signed: 09/30/2019 11:00:36 AM By: Sandre Kitty Entered By: Sandre Kitty on 09/25/2019 11:40:29

## 2019-10-07 ENCOUNTER — Encounter (HOSPITAL_BASED_OUTPATIENT_CLINIC_OR_DEPARTMENT_OTHER): Payer: Medicare Other | Admitting: Physician Assistant

## 2019-10-07 DIAGNOSIS — S31819A Unspecified open wound of right buttock, initial encounter: Secondary | ICD-10-CM | POA: Diagnosis not present

## 2019-10-07 NOTE — Progress Notes (Addendum)
Penny Perez, Penny Perez ([id number]) Visit Report for 10/07/2019 Allergy List Details Patient Name: Date of Service: NEVADA, KIRCHNER [facility] 10/07/2019 3:00 PM Medical Record Number: [id number] Patient Account Number: [id number] Date of Birth/Sex: Treating RN: 03/28/1932 (86 y.o. Penny Perez, [name] Honorio Devol: [name] Other Clinician: Referring Renne Cornick: Treating Kadden Osterhout/Extender: [name] in Treatment: 44 Allergies Active Allergies ampicillin Reaction: fever Severity: Moderate cephalexin Reaction: unknown Severity: Mild clarithromycin Reaction: unknown codeine Reaction: unknown Cipro Reaction: blisters in mouth Severity: Moderate doxycycline Reaction: unknown erythromycin base Reaction: unknown gatifloxacin Reaction: blisters in mouth hydrocodone Reaction: unknown penicillin Reaction: blisters in mouth Sulfa (Sulfonamide Antibiotics) Reaction: abdominal pain and bloating lidocaine Reaction: burns skin Allergy Notes Electronic Signature(s) Signed: 10/07/2019 5:39:04 PM By: Deon Pilling Entered By: Deon Pilling on 10/07/2019 15:42:34 -------------------------------------------------------------------------------- Arrival Information Details Patient Name: Date of Service: Penny Perez 10/07/2019 3:00 PM Medical Record Number: [id number] Patient Account Number: [id number] Date of Birth/Sex: Treating RN: 1933-01-17 (86 y.o. Penny Perez, [name] Cintia Gleed: [name] Other Clinician: Referring Rilley Poulter: Treating Nik Gorrell/Extender: [name] in Treatment: [phone] Visit Information Patient Arrived: Charlyn Minerva Time: 15:34 Accompanied By: daughter Transfer Assistance: None Patient Identification Verified: Yes Secondary Verification Process Completed: Yes Patient Requires Transmission-Based Precautions: No Patient Has Alerts: No History Since Last Visit Added or deleted any  medications: No Any new allergies or adverse reactions: No Had a fall or experienced change in activities of daily living that may affect risk of falls: No Signs or symptoms of abuse/neglect since last visito No Hospitalized since last visit: No Implantable device outside of the clinic excluding cellular tissue based products placed in the center since last visit: No Has Dressing in Place as Prescribed: Yes Has Compression in Place as Prescribed: No Electronic Signature(s) Signed: 10/07/2019 5:39:04 PM By: Deon Pilling Entered By: Deon Pilling on 10/07/2019 15:34:39 -------------------------------------------------------------------------------- Clinic Level of Care Assessment Details Patient Name: Date of Service: Penny Perez [facility] 10/07/2019 3:00 PM Medical Record Number: [id number] Patient Account Number: [id number] Date of Birth/Sex: Treating RN: 1932/10/11 (86 y.o. [name] Primary Care Mckensie Scotti: [name] Other Clinician: Referring Jannat Rosemeyer: Treating Melody Savidge/Extender: [name] in Treatment: 44 Clinic Level of Care Assessment Items TOOL 4 Quantity Score []  - 0 Use when only an EandM is performed on FOLLOW-UP visit ASSESSMENTS - Nursing Assessment / Reassessment X- 1 10 Reassessment of Co-morbidities (includes updates in patient status) X- 1 5 Reassessment of Adherence to Treatment Plan ASSESSMENTS - Wound and Skin A ssessment / Reassessment []  - 0 Simple Wound Assessment / Reassessment - one wound X- 2 5 Complex Wound Assessment / Reassessment - multiple wounds []  - 0 Dermatologic / Skin Assessment (not related to wound area) ASSESSMENTS - Focused Assessment X- 2 5 Circumferential Edema Measurements - multi extremities []  - 0 Nutritional Assessment / Counseling / Intervention X- 1 5 Lower Extremity Assessment (monofilament, tuning fork, pulses) []  - 0 Peripheral Arterial Disease Assessment (using hand held  doppler) ASSESSMENTS - Ostomy and/or Continence Assessment and Care []  - 0 Incontinence Assessment and Management []  - 0 Ostomy Care Assessment and Management (repouching, etc.) PROCESS - Coordination of Care X - Simple Patient / Family Education for ongoing care 1 15 []  - 0 Complex (extensive) Patient / Family Education for ongoing care X- 1 10 Staff obtains Consents, Records, T Results / Process Orders est X- 1 10 Staff telephones HHA, Nursing Homes / Clarify orders / etc []  - 0 Routine Transfer to  another Facility (non-emergent condition) []  - 0 Routine Hospital Admission (non-emergent condition) []  - 0 New Admissions / Insurance Authorizations / Ordering NPWT Apligraf, etc. , []  - 0 Emergency Hospital Admission (emergent condition) X- 1 10 Simple Discharge Coordination []  - 0 Complex (extensive) Discharge Coordination PROCESS - Special Needs []  - 0 Pediatric / Minor Patient Management []  - 0 Isolation Patient Management []  - 0 Hearing / Language / Visual special needs []  - 0 Assessment of Community assistance (transportation, D/C planning, etc.) []  - 0 Additional assistance / Altered mentation []  - 0 Support Surface(s) Assessment (bed, cushion, seat, etc.) INTERVENTIONS - Wound Cleansing / Measurement []  - 0 Simple Wound Cleansing - one wound X- 2 5 Complex Wound Cleansing - multiple wounds X- 1 5 Wound Imaging (photographs - any number of wounds) []  - 0 Wound Tracing (instead of photographs) []  - 0 Simple Wound Measurement - one wound X- 2 5 Complex Wound Measurement - multiple wounds INTERVENTIONS - Wound Dressings X - Small Wound Dressing one or multiple wounds 2 10 []  - 0 Medium Wound Dressing one or multiple wounds []  - 0 Large Wound Dressing one or multiple wounds X- 1 5 Application of Medications - topical []  - 0 Application of Medications - injection INTERVENTIONS - Miscellaneous []  - 0 External ear exam []  - 0 Specimen Collection  (cultures, biopsies, blood, body fluids, etc.) []  - 0 Specimen(s) / Culture(s) sent or taken to Lab for analysis []  - 0 Patient Transfer (multiple staff / Civil Service fast streamer / Similar devices) []  - 0 Simple Staple / Suture removal (25 or less) []  - 0 Complex Staple / Suture removal (26 or more) []  - 0 Hypo / Hyperglycemic Management (close monitor of Blood Glucose) []  - 0 Ankle / Brachial Index (ABI) - do not check if billed separately X- 1 5 Vital Signs Has the patient been seen at the hospital within the last three years: Yes Total Score: 140 Level Of Care: New/Established - Level 4 Electronic Signature(s) Signed: 10/07/2019 5:28:47 PM By: Baruch Gouty RN, BSN Entered By: Baruch Gouty on 10/07/2019 16:29:38 -------------------------------------------------------------------------------- Lower Extremity Assessment Details Patient Name: Date of Service: MICHALENE, DEBRULER [facility] 10/07/2019 3:00 PM Medical Record Number: [id number] Patient Account Number: [id number] Date of Birth/Sex: Treating RN: 1932-05-20 (86 y.o. Debby Bud Primary Care Cledith Kamiya: [name] Other Clinician: Referring Shequila Neglia: Treating Jeniece Hannis/Extender: [name] in Treatment: 44 Edema Assessment Assessed: [name]: Yes] [name]: Yes] Edema: [Left: Yes] [Right: Yes] Calf Left: Right: Point of Measurement: cm From Medial Instep 35 cm 31 cm Ankle Left: Right: Point of Measurement: cm From Medial Instep 21.5 cm 22 cm Vascular Assessment Pulses: Dorsalis Pedis Palpable: [Left:Yes] [Right:Yes] Electronic Signature(s) Signed: 10/07/2019 5:39:04 PM By: Deon Pilling Entered By: Deon Pilling on 10/07/2019 15:35:58 -------------------------------------------------------------------------------- [place] Details Patient Name: Date of Service: JACARI, KIRSTEN Perez 10/07/2019 3:00 PM Medical Record Number: [id number] Patient Account Number: [id number] Date of  Birth/Sex: Treating RN: 12-07-1932 (86 y.o. [name] Primary Care Deborrah Mabin: [name] Other Clinician: Referring Brook Geraci: Treating Wm Fruchter/Extender: [name] in Treatment: [id number] Active Inactive Electronic Signature(s) Signed: 10/29/2019 4:42:40 PM By: Baruch Gouty RN, BSN Previous Signature: 10/07/2019 5:28:47 PM Version By: Baruch Gouty RN, BSN Entered By: Baruch Gouty on 10/27/2019 13:19:21 -------------------------------------------------------------------------------- Pain Assessment Details Patient Name: Date of Service: BLAIR, MESINA Perez 10/07/2019 3:00 PM Medical Record Number: [id number] Patient Account Number: [id number] Date of Birth/Sex: Treating RN: 1932-05-05 (86 y.o. Penny Perez, Tammi Klippel Primary  Care Lawarence Meek: [name] Other Clinician: Referring Izzie Geers: Treating Larsen Dungan/Extender: [name] in Treatment: [phone] Active Problems Location of Pain Severity and Description of Pain Patient Has Paino Yes Site Locations Pain Location: Generalized Pain, Pain in Ulcers Rate the pain. Current Pain Level: 8 Worst Pain Level: 10 Least Pain Level: 0 Tolerable Pain Level: 8 Pain Management and Medication Current Pain Management: Medication: No Cold Application: No Rest: No Massage: No Activity: No T.E.N.S.: No Heat Application: No Leg drop or elevation: No Is the Current Pain Management Adequate: Adequate How does your wound impact your activities of daily livingo Sleep: No Bathing: No Appetite: No Relationship With Others: No Bladder Continence: No Emotions: No Bowel Continence: No Work: No Toileting: No Drive: No Dressing: No Hobbies: No Electronic Signature(s) Signed: 10/07/2019 5:39:04 PM By: Deon Pilling Entered By: Deon Pilling on 10/07/2019 15:35:07 -------------------------------------------------------------------------------- Patient/Caregiver Education  Details Patient Name: Date of Service: Kirkland Hun 9/8/2021andnbsp3:00 PM Medical Record Number: [id number] Patient Account Number: [id number] Date of Birth/Gender: Treating RN: 1932-03-25 (86 y.o. [name] Primary Care Physician: [name] Other Clinician: Referring Physician: Treating Physician/Extender: [name] in Treatment: 27 Education Assessment Education Provided To: Patient Education Topics Provided Venous: Methods: Explain/Verbal Responses: Reinforcements needed, State content correctly Wound/Skin Impairment: Methods: Explain/Verbal Responses: Reinforcements needed, State content correctly Electronic Signature(s) Signed: 10/07/2019 5:28:47 PM By: Baruch Gouty RN, BSN Entered By: Baruch Gouty on 10/07/2019 16:02:59 -------------------------------------------------------------------------------- Wound Assessment Details Patient Name: Date of Service: KEHAULANI, FRUIN Perez 10/07/2019 3:00 PM Medical Record Number: [id number] Patient Account Number: [id number] Date of Birth/Sex: Treating RN: 18-Dec-1932 (86 y.o. Penny Perez, [name] Kyng Matlock: [name] Other Clinician: Referring Carrina Schoenberger: Treating Alizzon Dioguardi/Extender: [name] in Treatment: 59 Wound Status Wound Number: 3 Primary Etiology: Lymphedema Wound Location: Right, Anterior Lower Leg Wound Status: Open Wounding Event: Gradually Appeared Comorbid History: Cataracts, Asthma, Hypertension, Osteoarthritis Date Acquired: 09/21/2019 Weeks Of Treatment: 2 Clustered Wound: No Photos Photo Uploaded By: Mikeal Hawthorne on 10/08/2019 12:51:54 Wound Measurements Length: (cm) 3 Width: (cm) 4 Depth: (cm) 0.1 Area: (cm) 9.425 Volume: (cm) 0.942 % Reduction in Area: -84.6% % Reduction in Volume: -84.3% Epithelialization: None Tunneling: No Undermining: No Wound Description Classification: Full Thickness Without  Exposed Support Structures Wound Margin: Distinct, outline attached Exudate Amount: Medium Exudate Type: Serosanguineous Exudate Color: red, brown Foul Odor After Cleansing: No Slough/Fibrino Yes Wound Bed Granulation Amount: Large (67-100%) Exposed Structure Granulation Quality: Red Fascia Exposed: No Necrotic Amount: Small (1-33%) Fat Layer (Subcutaneous Tissue) Exposed: Yes Necrotic Quality: Adherent Slough Tendon Exposed: No Muscle Exposed: No Joint Exposed: No Bone Exposed: No Electronic Signature(s) Signed: 10/07/2019 5:39:04 PM By: Deon Pilling Entered By: Deon Pilling on 10/07/2019 15:41:04 -------------------------------------------------------------------------------- Wound Assessment Details Patient Name: Date of Service: KAMI, KUBE [facility] 10/07/2019 3:00 PM Medical Record Number: [id number] Patient Account Number: [id number] Date of Birth/Sex: Treating RN: 29-Dec-1932 (86 y.o. Penny Perez, [name] Derec Mozingo: [name] Other Clinician: Referring Billye Nydam: Treating Ethelbert Thain/Extender: [name] in Treatment: 81 Wound Status Wound Number: 4 Primary Etiology: Skin Tear Wound Location: Left Lower Leg Wound Status: Open Wounding Event: Trauma Comorbid History: Cataracts, Asthma, Hypertension, Osteoarthritis Date Acquired: 09/27/2019 Weeks Of Treatment: 1 Clustered Wound: No Photos Photo Uploaded By: Mikeal Hawthorne on 10/08/2019 12:51:54 Wound Measurements Length: (cm) 3.8 Width: (cm) 1.5 Depth: (cm) 0.1 Area: (cm) 4.477 Volume: (cm) 0.448 % Reduction in Area: 52.5% % Reduction in Volume: 52.4% Epithelialization: None Tunneling:  No Undermining: No Wound Description Classification: Full Thickness Without Exposed Support Structures Wound Margin: Distinct, outline attached Exudate Amount: Medium Exudate Type: Serosanguineous Exudate Color: red, brown Foul Odor After Cleansing: No Slough/Fibrino Yes Wound  Bed Granulation Amount: Medium (34-66%) Exposed Structure Granulation Quality: Red Fascia Exposed: No Necrotic Amount: Medium (34-66%) Fat Layer (Subcutaneous Tissue) Exposed: Yes Necrotic Quality: Adherent Slough Tendon Exposed: No Muscle Exposed: No Joint Exposed: No Bone Exposed: No Electronic Signature(s) Signed: 10/07/2019 5:39:04 PM By: Deon Pilling Entered By: Deon Pilling on 10/07/2019 15:42:06 -------------------------------------------------------------------------------- Vitals Details Patient Name: Date of Service: Penny Perez 10/07/2019 3:00 PM Medical Record Number: [id number] Patient Account Number: [id number] Date of Birth/Sex: Treating RN: [birth year] (86 y.o. Penny Perez, [name] [place] Souter: [name] Other Clinician: Referring Wylder Macomber: Treating Zykera Abella/Extender: [name] in Treatment: [phone] Vital Signs Time Taken: 15:30 Temperature (F): 98.3 Height (in): 60 Pulse (bpm): 77 Weight (lbs): 115 Respiratory Rate (breaths/min): 18 Body Mass Index (BMI): 22.5 Blood Pressure (mmHg): 133/72 Reference Range: 80 - 120 mg / dl Electronic Signature(s) Signed: 10/07/2019 5:39:04 PM By: Deon Pilling Entered By: Deon Pilling on 10/07/2019 15:36:40

## 2019-10-07 NOTE — Progress Notes (Addendum)
Penny Perez, Penny Perez ([id number]) Visit Report for 10/07/2019 Chief Complaint Document Details Patient Name: Date of Service: Penny Perez, Penny Perez [facility] 10/07/2019 3:00 PM Medical Record Number: [id number] Patient Account Number: [id number] Date of Birth/Sex: Treating RN: 08-23-1932 (86 y.o. [name] Primary Care Provider: [name] Other Clinician: Referring Provider: Treating Provider/Extender: [name] in Treatment: [phone] Information Obtained from: Patient Chief Complaint Right buttock ulcer and left leg ulcer Electronic Signature(s) Signed: 10/07/2019 3:30:27 PM By: Worthy Keeler PA-C Entered By: Worthy Keeler on 10/07/2019 15:30:27 -------------------------------------------------------------------------------- HPI Details Patient Name: Date of Service: Penny Perez, Penny Perez 10/07/2019 3:00 PM Medical Record Number: [id number] Patient Account Number: [id number] Date of Birth/Sex: Treating RN: Jan 01, 1933 (86 y.o. [name] Primary Care Provider: [name] Other Clinician: Referring Provider: Treating Provider/Extender: [name] in Treatment: 25 History of Present Illness HPI Description: 12/03/2018 on evaluation today patient appears for initial inspection here in our clinic concerning an issue that she has been having a light having in the right gluteal region with an ulcer which initially she describes as having been a knot which when she went to see the dermatologist was biopsied and this was on 08/26/2018. Subsequently that revealed what appeared to be an inflamed ulcer with granulation tissue. Fortunately there was no signs of active infection based on what they saw on pathology. The patient states that since the biopsy she has been having a lot of pain. Fortunately there is no signs of active infection at this time no fever chills noted. She does have a history of hypertension but really no major medical  problems significant otherwise although she does have a lot of allergies including allergies to [facility] as well as antibiotics. In fact she has some ulcers in her mouth secondary to having been prescribed an antibiotic for the current ulcer. She states she has been having trouble sleeping due to the pain in the ulcer area. 12/10/2018 on evaluation today patient's wound actually showed signs of improvement as far as the loosening up of the necrotic tissue is concerned at this point. I do believe that that has done excellent at this point I think that we need to try and clear away some of the necrotic tissue in the base of the wound I discussed that with the patient as well. Fortunately she is not having any significant pain and states the Santyl did not burn although in the past she notes that it did cause her some trouble with the ulcer on her leg causing some burning. No fevers, chills, nausea, vomiting, or diarrhea. 12/17/2018 on evaluation today patient appears to be doing well with regard to her wound. This does not appear to be any more deep than it was after the last evaluation and debridement last week. With that being said there are some necrotic tissue on the surface of the wound which is can require some sharp debridement at this point. Fortunately there is no evidence of active infection at this time which is good news. No fevers, chills, nausea, vomiting, or diarrhea. 12/31/2018 on evaluation today patient appears to be doing a little better in regard to the overall appearance of her wound currently. She is tolerating the dressing changes without complication. Fortunately there is no signs of active infection at this time. With that being said she still has some necrotic tissue in the base of the wound that still needs to be removed this is very tough however and I was not able to  debrided away last week. She really does not want me to perform any debridement today either due to how bad it  hurt after I did this last time. With that being said I think we may be able to switch the dressing to something else to try to help with clearing this out a little better. 01/14/2019 upon evaluation today patient actually appears to be doing better with regard to her wound. She is showing signs of new granulation tissue and I do feel like that this is starting to improve. That is good news. Fortunately there is no evidence of active infection at this point. No fevers, chills, nausea, vomiting, or diarrhea. 12/30-Patient returns at 2 weeks, she does complain of some discomfort at night in the right gluteal area, she is obviously active and stays on her feet and not in a chair during the day. Denies any fevers chills shakes. Complains that the area on the ulcer feels wet fairly often 02/11/2019 on evaluation today patient appears to be doing okay with regard to her wound. She has been tolerating the dressing changes without complication. Fortunately there is no signs of active infection at this time. No fevers, chills, nausea, vomiting, or diarrhea. She does note however that she is having issues financially with having to come as frequently even as every other week. She states that she is not sure she is going to be able to continue to do so. Fortunately there is no signs of anything doing poorly I think we may be able to spread this out for her in order to help alleviate some of this especially in light of the fact that she does have home health coming out. 03/11/2019 upon evaluation today patient appears to be doing really roughly the same with regard to her wound. This is measuring close to where it was previous although may be slightly smaller. Fortunately there is no evidence of active infection at this time obviously. With that being said this is not healing quite as quickly as I would like to see therefore I may actually perform a repeat culture today to see if there is anything that shows up at  this point. Fortunately there is no sign of active infection systemically for certain. The patient is somewhat frustrated with how long this is taking I completely understand your frustration. Nonetheless I explained that wounds like this often do take a very long time to heal. 04/08/2019 upon evaluation today patient appears to be doing well with regard to her wound. This is not completely healed but does seem to be somewhat better. They have been using silver alginate at this point which is great news and overall the patient does seem to be making some progress. With that being said she did culture positive for MRSA unfortunately she is allergic to pretty much everything. I did want to discuss with her and she did mention today that she be willing to try the linezolid she is never taken this before. Hopefully it will not cause any problems for her. 06/03/2019 upon evaluation today patient appears to be doing well with regard to her wound. She does have a new wound on her left lower extremity that occurred as a result of swelling of her legs when she was in the hospital he took off of her fluid pills and this made some problem here. With that being said fortunately this seems to be getting better and the patient states she is really not worried about this she has been putting  antibiotic ointment on this at home and covering this with a drain seen which seems to be doing fairly well now that she is not draining as much. At one point her daughter was having to change the dressings much more frequently. Fortunately that has improved. Her swelling is better which is also good news. The patient was in the hospital from 05/04/2019 through 05/08/2019 where she had IV antibiotic therapy with vancomycin due to a C. difficile infection. She is better in regard to the infection but still having a lot of weakness and tiredness in general. She did get the gentamicin and they have been using this at home as far as dressing  changes are concerned. She tells me the hospital did not look at her wound at all during the time that she was there. 07/08/2019 upon evaluation today patient appears to be doing better in regard to her leg which is healed and that is excellent news. With regard to her wound in the right gluteal region this is still open and though it does not appear to be showing any signs of infection there is some dried gentamicin cream in the base of the wound. I think we probably do not need the gentamicin any longer based on what I am seeing she has a lot of new skin and epithelization on the sidewalls of the wound and I think this is good to heal with somewhat of a tunnel which is okay as long as it heals. Again this is something we discussed in the past. 7/13; monthly follow-up. The area that we are looking at is in the upper right buttock. The patient and her daughter told me that this was originally an abscess that was IandD or biopsied by dermatology. The wound circumference is inclement quite dramatically since last time in fact this would be much too small to put silver alginate in. 09/23/2019 on evaluation today patient appears to be doing better in regard to her wound in the right buttock region. This actually seems to be doing quite well. With that being said unfortunately she is having issues with her legs bilaterally today although she has more of a wound on the right and just leaking on the left. 09/30/2019 on evaluation today patient appears to be doing okay in regard to her wounds that she had a lot of trouble with the compression wrap since the last time I seen her. She did call in [place] actually called me to discuss as well what to do. Unfortunately she just has some much lower extremity edema that she is not able to tolerate the compression wraps even with a mild Kerlix and Coban wrap unfortunately. With that being said I do believe she needs some compression and her culture did come back showing  Pseudomonas so I do believe using topical gentamicin would also be beneficial over the wounds themselves to help with this. Fortunately she has no signs of systemic infection which is good news. 10/07/2019 on evaluation today patient appears to actually be making some progress in regard to her wound. Things seem to be doing much better which is great news. There does not appear to be any signs of active infection at this time. This is actually good news considering she has been using gentamicin she also has not been using the wraps that were recommended either. All of which again I think would have help things along even more. Nonetheless with the changes in her fluid pills that her doctor made I think things  are significantly improved. Electronic Signature(s) Signed: 10/07/2019 5:19:09 PM By: Worthy Keeler PA-C Entered By: Worthy Keeler on 10/07/2019 17:19:09 -------------------------------------------------------------------------------- Physical Exam Details Patient Name: Date of Service: Penny Perez, Penny Perez [facility] 10/07/2019 3:00 PM Medical Record Number: [id number] Patient Account Number: [id number] Date of Birth/Sex: Treating RN: 1932-09-05 (86 y.o. [name] Primary Care Provider: [name] Other Clinician: Referring Provider: Treating Provider/Extender: [name] in Treatment: 30 Constitutional Well-nourished and well-hydrated in no acute distress. Respiratory normal breathing without difficulty. Psychiatric this patient is able to make decisions and demonstrates good insight into disease process. Alert and Oriented x 3. pleasant and cooperative. Notes Upon inspection patient's wounds actually showed some signs of improvement which is great news there does not appear to be any signs of active infection which is also great news. Overall very pleased with where things stand here today. Electronic Signature(s) Signed: 10/07/2019 5:19:24 PM By: Worthy Keeler PA-C Entered By: Worthy Keeler on 10/07/2019 17:19:24 -------------------------------------------------------------------------------- Physician Orders Details Patient Name: Date of Service: Penny Perez, Penny Perez [facility] 10/07/2019 3:00 PM Medical Record Number: [id number] Patient Account Number: [id number] Date of Birth/Sex: Treating RN: 1932-02-25 (86 y.o. 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 Dressing Change Frequency Wound #3 Right,Anterior Lower Leg Change dressing three times week. Wound #4 Left Lower Leg Change dressing three times week. Skin Barriers/Peri-Wound Care Moisturizing lotion - to legs Wound Cleansing May shower and wash wound with soap and water. - with dressing changes Primary Wound Dressing Wound #3 Right,Anterior Lower Leg Mepitel or Adaptic Wound #4 Left Lower Leg Mepitel or Adaptic Secondary Dressing Wound #3 Right,Anterior Lower Leg Kerlix/Rolled Gauze Dry Gauze Wound #4 Left Lower Leg Kerlix/Rolled Gauze Dry Gauze Edema Control Avoid standing for long periods of time Elevate legs to the level of the heart or above for 30 minutes daily and/or when sitting, a frequency of: - throughout the day Exercise regularly [place] skilled nursing for wound care. - medi home Electronic Signature(s) Signed: 10/07/2019 5:28:47 PM By: Baruch Gouty  RN, BSN Signed: 10/07/2019 5:46:37 PM By: Worthy Keeler PA-C Entered By: Baruch Gouty on 10/07/2019 16:37:09 -------------------------------------------------------------------------------- Problem List Details Patient Name: Date of Service: Penny Perez, Penny Perez 10/07/2019 3:00 PM Medical Record Number: [id number] Patient Account Number: [id number] Date of Birth/Sex: Treating RN: May 31, 1932 (86 y.o. 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 12/03/2018 No Yes I10 Essential (primary) hypertension 12/03/2018 No Yes I87.2 Venous insufficiency (chronic) (peripheral) 06/03/2019 No Yes L97.822 Non-pressure chronic ulcer of other part of left lower leg with fat layer exposed5/05/2019 No Yes Inactive Problems Resolved Problems Electronic Signature(s) Signed: 10/07/2019 3:30:21 PM By: Worthy Keeler PA-C Entered By: Worthy Keeler on 10/07/2019 15:30:20 -------------------------------------------------------------------------------- Progress Note Details Patient Name: Date of  Service: Penny Perez, Penny Perez [facility] 10/07/2019 3:00 PM Medical Record Number: [id number] Patient Account Number: [id number] Date of Birth/Sex: Treating RN: 1932-08-25 (86 y.o. [name] Primary Care Provider: [name] Other Clinician: Referring Provider: Treating Provider/Extender: [name] in Treatment: [phone] Subjective Chief Complaint Information obtained from Patient Right buttock ulcer and left leg ulcer History of Present Illness (HPI) 12/03/2018 on evaluation today patient appears for initial inspection  here in our clinic concerning an issue that she has been having a light having in the right gluteal region with an ulcer which initially she describes as having been a knot which when she went to see the dermatologist was biopsied and this was on 08/26/2018. Subsequently that revealed what appeared to be an inflamed ulcer with granulation tissue. Fortunately there was no signs of active infection based on what they saw on pathology. The patient states that since the biopsy she has been having a lot of pain. Fortunately there is no signs of active infection at this time no fever chills noted. She does have a history of hypertension but really no major medical problems significant otherwise although she does have a lot of allergies including allergies to [facility] as well as antibiotics. In fact she has some ulcers in her mouth secondary to having been prescribed an antibiotic for the current ulcer. She states she has been having trouble sleeping due to the pain in the ulcer area. 12/10/2018 on evaluation today patient's wound actually showed signs of improvement as far as the loosening up of the necrotic tissue is concerned at this point. I do believe that that has done excellent at this point I think that we need to try and clear away some of the necrotic tissue in the base of the wound I discussed that with the patient as well. Fortunately she is not having any significant pain and states the Santyl did not burn although in the past she notes that it did cause her some trouble with the ulcer on her leg causing some burning. No fevers, chills, nausea, vomiting, or diarrhea. 12/17/2018 on evaluation today patient appears to be doing well with regard to her wound. This does not appear to be any more deep than it was after the last evaluation and debridement last week. With that being said there are some necrotic tissue on the surface of the wound which is can require some sharp debridement at this point.  Fortunately there is no evidence of active infection at this time which is good news. No fevers, chills, nausea, vomiting, or diarrhea. 12/31/2018 on evaluation today patient appears to be doing a little better in regard to the overall appearance of her wound currently. She is tolerating the dressing changes without complication. Fortunately there is no signs of active infection at this time. With that being said she still has some necrotic tissue in the base of the wound that still needs to be removed this is very tough however and I was not able to debrided away last week. She really does not want me to perform any debridement today either due to how bad it hurt after I did this last time. With that being said I think we may be able to switch the dressing to something else to try to help with clearing this out a little better. 01/14/2019 upon evaluation today patient actually appears to be doing better with regard to her wound. She is showing signs of new granulation tissue and I do feel like that  this is starting to improve. That is good news. Fortunately there is no evidence of active infection at this point. No fevers, chills, nausea, vomiting, or diarrhea. 12/30-Patient returns at 2 weeks, she does complain of some discomfort at night in the right gluteal area, she is obviously active and stays on her feet and not in a chair during the day. Denies any fevers chills shakes. Complains that the area on the ulcer feels wet fairly often 02/11/2019 on evaluation today patient appears to be doing okay with regard to her wound. She has been tolerating the dressing changes without complication. Fortunately there is no signs of active infection at this time. No fevers, chills, nausea, vomiting, or diarrhea. She does note however that she is having issues financially with having to come as frequently even as every other week. She states that she is not sure she is going to be able to continue to do  so. Fortunately there is no signs of anything doing poorly I think we may be able to spread this out for her in order to help alleviate some of this especially in light of the fact that she does have home health coming out. 03/11/2019 upon evaluation today patient appears to be doing really roughly the same with regard to her wound. This is measuring close to where it was previous although may be slightly smaller. Fortunately there is no evidence of active infection at this time obviously. With that being said this is not healing quite as quickly as I would like to see therefore I may actually perform a repeat culture today to see if there is anything that shows up at this point. Fortunately there is no sign of active infection systemically for certain. The patient is somewhat frustrated with how long this is taking I completely understand your frustration. Nonetheless I explained that wounds like this often do take a very long time to heal. 04/08/2019 upon evaluation today patient appears to be doing well with regard to her wound. This is not completely healed but does seem to be somewhat better. They have been using silver alginate at this point which is great news and overall the patient does seem to be making some progress. With that being said she did culture positive for MRSA unfortunately she is allergic to pretty much everything. I did want to discuss with her and she did mention today that she be willing to try the linezolid she is never taken this before. Hopefully it will not cause any problems for her. 06/03/2019 upon evaluation today patient appears to be doing well with regard to her wound. She does have a new wound on her left lower extremity that occurred as a result of swelling of her legs when she was in the hospital he took off of her fluid pills and this made some problem here. With that being said fortunately this seems to be getting better and the patient states she is really not  worried about this she has been putting antibiotic ointment on this at home and covering this with a drain seen which seems to be doing fairly well now that she is not draining as much. At one point her daughter was having to change the dressings much more frequently. Fortunately that has improved. Her swelling is better which is also good news. The patient was in the hospital from 05/04/2019 through 05/08/2019 where she had IV antibiotic therapy with vancomycin due to a C. difficile infection. She is better in regard to the  infection but still having a lot of weakness and tiredness in general. She did get the gentamicin and they have been using this at home as far as dressing changes are concerned. She tells me the hospital did not look at her wound at all during the time that she was there. 07/08/2019 upon evaluation today patient appears to be doing better in regard to her leg which is healed and that is excellent news. With regard to her wound in the right gluteal region this is still open and though it does not appear to be showing any signs of infection there is some dried gentamicin cream in the base of the wound. I think we probably do not need the gentamicin any longer based on what I am seeing she has a lot of new skin and epithelization on the sidewalls of the wound and I think this is good to heal with somewhat of a tunnel which is okay as long as it heals. Again this is something we discussed in the past. 7/13; monthly follow-up. The area that we are looking at is in the upper right buttock. The patient and her daughter told me that this was originally an abscess that was IandD or biopsied by dermatology. The wound circumference is inclement quite dramatically since last time in fact this would be much too small to put silver alginate in. 09/23/2019 on evaluation today patient appears to be doing better in regard to her wound in the right buttock region. This actually seems to be doing quite  well. With that being said unfortunately she is having issues with her legs bilaterally today although she has more of a wound on the right and just leaking on the left. 09/30/2019 on evaluation today patient appears to be doing okay in regard to her wounds that she had a lot of trouble with the compression wrap since the last time I seen her. She did call in [place] actually called me to discuss as well what to do. Unfortunately she just has some much lower extremity edema that she is not able to tolerate the compression wraps even with a mild Kerlix and Coban wrap unfortunately. With that being said I do believe she needs some compression and her culture did come back showing Pseudomonas so I do believe using topical gentamicin would also be beneficial over the wounds themselves to help with this. Fortunately she has no signs of systemic infection which is good news. 10/07/2019 on evaluation today patient appears to actually be making some progress in regard to her wound. Things seem to be doing much better which is great news. There does not appear to be any signs of active infection at this time. This is actually good news considering she has been using gentamicin she also has not been using the wraps that were recommended either. All of which again I think would have help things along even more. Nonetheless with the changes in her fluid pills that her doctor made I think things are significantly improved. Allergies ampicillin (Severity: Moderate, Reaction: fever), cephalexin (Severity: Mild, Reaction: unknown), clarithromycin (Reaction: unknown), codeine (Reaction: unknown), Cipro (Severity: Moderate, Reaction: blisters in mouth), doxycycline (Reaction: unknown), erythromycin base (Reaction: unknown), gatifloxacin (Reaction: blisters in mouth), hydrocodone (Reaction: unknown), penicillin (Reaction: blisters in mouth), Sulfa (Sulfonamide Antibiotics) (Reaction: abdominal pain and bloating), lidocaine  (Reaction: burns skin) Objective Constitutional Well-nourished and well-hydrated in no acute distress. Vitals Time Taken: 3:30 PM, Height: 60 in, Weight: 115 lbs, BMI: 22.5, Temperature: 98.3 F, Pulse: 77  bpm, Respiratory Rate: 18 breaths/min, Blood Pressure: 133/72 mmHg. Respiratory normal breathing without difficulty. Psychiatric this patient is able to make decisions and demonstrates good insight into disease process. Alert and Oriented x 3. pleasant and cooperative. General Notes: Upon inspection patient's wounds actually showed some signs of improvement which is great news there does not appear to be any signs of active infection which is also great news. Overall very pleased with where things stand here today. Integumentary (Hair, Skin) Wound #3 status is Open. Original cause of wound was Gradually Appeared. The wound is located on the Right,Anterior Lower Leg. The wound measures 3cm length x 4cm width x 0.1cm depth; 9.425cm^2 area and 0.942cm^3 volume. There is Fat Layer (Subcutaneous Tissue) exposed. There is no tunneling or undermining noted. There is a medium amount of serosanguineous drainage noted. The wound margin is distinct with the outline attached to the wound base. There is large (67-100%) red granulation within the wound bed. There is a small (1-33%) amount of necrotic tissue within the wound bed including Adherent Slough. Wound #4 status is Open. Original cause of wound was Trauma. The wound is located on the Left Lower Leg. The wound measures 3.8cm length x 1.5cm width x 0.1cm depth; 4.477cm^2 area and 0.448cm^3 volume. There is Fat Layer (Subcutaneous Tissue) exposed. There is no tunneling or undermining noted. There is a medium amount of serosanguineous drainage noted. The wound margin is distinct with the outline attached to the wound base. There is medium (34-66%) red granulation within the wound bed. There is a medium (34-66%) amount of necrotic tissue within the  wound bed including Adherent Slough. 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 Dressing Change Frequency: Wound #3 Right,Anterior Lower Leg: Change dressing three times week. Wound #4 Left Lower Leg: Change dressing three times week. Skin Barriers/Peri-Wound Care: Moisturizing lotion - to legs Wound Cleansing: May shower and wash wound with soap and water. - with dressing changes Primary Wound Dressing: Wound #3 Right,Anterior Lower Leg: Mepitel or Adaptic Wound #4 Left Lower Leg: Mepitel or Adaptic Secondary Dressing: Wound #3 Right,Anterior Lower Leg: Kerlix/Rolled Gauze Dry Gauze Wound #4 Left Lower Leg: Kerlix/Rolled Gauze Dry Gauze Edema Control: Avoid standing for long periods of time Elevate legs to the level of the heart or above for 30 minutes daily and/or when sitting, a frequency of: - throughout the day Exercise regularly Home Health: [facility] for wound care. - [facility]. Despite the fact that I feel like she would do better with the compression I do not think the patient is going to utilize it as we would like to see. Therefore I Georgina Peer suggest that we go ahead and continue with using the Mepitel or Adaptic over top of the wound to try to keep it from being stuck to and then subsequently using just a dressing to cover. The patient wants to go back to just Neosporin and gauze again I really think this would be better as I am afraid of the gauze sticking. 2. She will use keep her legs elevated as much as possible since she does not want to have any compression wraps that can be of utmost importance. We  will see patient back for reevaluation in 2 weeks here  in the clinic. If anything worsens or changes patient will contact our office for additional recommendations. Electronic Signature(s) Signed: 10/07/2019 5:25:19 PM By: Worthy Keeler PA-C Entered By: Worthy Keeler on 10/07/2019 17:25:18 -------------------------------------------------------------------------------- SuperBill Details Patient Name: Date of Service: Penny Perez, Penny Perez 10/07/2019 Medical Record Number: [id number] Patient Account Number: [id number] Date of Birth/Sex: Treating RN: [birth year] (86 y.o. 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 I10 Essential (primary) hypertension I87.2 Venous insufficiency (chronic) (peripheral) L97.822 Non-pressure chronic ulcer of other part of left lower leg with fat layer exposed Facility Procedures CPT4 Code: [id number] Description: 99214 - WOUND CARE VISIT-LEV 4 EST PT Modifier: Quantity: 1 Physician Procedures : CPT4 Code Description Modifier [id number] - WC PHYS LEVEL 3 - EST PT ICD-10 Diagnosis Description S31.819A Unspecified open wound of right buttock, initial encounter L98.418 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

## 2019-10-15 ENCOUNTER — Other Ambulatory Visit (HOSPITAL_COMMUNITY): Payer: Self-pay | Admitting: Nephrology

## 2019-10-15 DIAGNOSIS — R609 Edema, unspecified: Secondary | ICD-10-CM

## 2019-10-21 ENCOUNTER — Encounter (HOSPITAL_BASED_OUTPATIENT_CLINIC_OR_DEPARTMENT_OTHER): Payer: Medicare Other | Admitting: Physician Assistant

## 2019-10-22 ENCOUNTER — Other Ambulatory Visit: Payer: Self-pay | Admitting: Nephrology

## 2019-10-22 DIAGNOSIS — R109 Unspecified abdominal pain: Secondary | ICD-10-CM

## 2019-10-22 DIAGNOSIS — N1832 Chronic kidney disease, stage 3b: Secondary | ICD-10-CM

## 2019-10-23 ENCOUNTER — Ambulatory Visit (HOSPITAL_COMMUNITY)
Admission: RE | Admit: 2019-10-23 | Discharge: 2019-10-23 | Disposition: A | Discharge status: Home / Self Care | Payer: Medicare Other | Source: Ambulatory Visit | Attending: Nephrology | Admitting: Nephrology

## 2019-10-23 ENCOUNTER — Other Ambulatory Visit: Payer: Self-pay

## 2019-10-23 ENCOUNTER — Encounter (HOSPITAL_COMMUNITY): Payer: Medicare Other

## 2019-10-23 DIAGNOSIS — I10 Essential (primary) hypertension: Secondary | ICD-10-CM | POA: Diagnosis not present

## 2019-10-23 DIAGNOSIS — R609 Edema, unspecified: Secondary | ICD-10-CM

## 2019-10-23 NOTE — Progress Notes (Signed)
  Echocardiogram 2D Echocardiogram has been performed.  Penny Perez 10/23/2019, 3:55 PM

## 2019-10-24 LAB — ECHOCARDIOGRAM COMPLETE
AR max vel: 1.35 cm2
AV Area VTI: 1.3 cm2
AV Area mean vel: 1.13 cm2
AV Mean grad: 8 mmHg
AV Peak grad: 14.4 mmHg
Ao pk vel: 1.9 m/s
Area-P 1/2: 3.23 cm2
P 1/2 time: 307 msec
S' Lateral: 1.9 cm

## 2019-10-28 ENCOUNTER — Encounter (HOSPITAL_BASED_OUTPATIENT_CLINIC_OR_DEPARTMENT_OTHER): Payer: Medicare Other | Admitting: Physician Assistant

## 2019-11-03 ENCOUNTER — Ambulatory Visit
Admission: RE | Admit: 2019-11-03 | Discharge: 2019-11-03 | Disposition: A | Discharge status: Home / Self Care | Payer: Medicare Other | Source: Ambulatory Visit | Attending: Nephrology | Admitting: Nephrology

## 2019-11-03 DIAGNOSIS — N1832 Chronic kidney disease, stage 3b: Secondary | ICD-10-CM

## 2019-11-03 DIAGNOSIS — R109 Unspecified abdominal pain: Secondary | ICD-10-CM

## 2020-08-27 IMAGING — CT CT ABD-PELV W/O CM
2 of 4 series · 16 of 46 positions shown, 18 images · non-contrast
Comparison: CT abdomen pelvis dated 03/20/2017.

CLINICAL DATA: 86-year-old female with abdominal distension.

EXAM:
CT ABDOMEN AND PELVIS WITHOUT CONTRAST
TECHNIQUE: Multidetector CT imaging of the abdomen and pelvis was performed
following the standard protocol without IV contrast.

[Series 2: axial (person_name) (person_name) · axial · 0.71mm/px · z∈[+534,+838]mm · 13 of 67 slices shown, 15 images]
[im 3/67  soft-tissue]
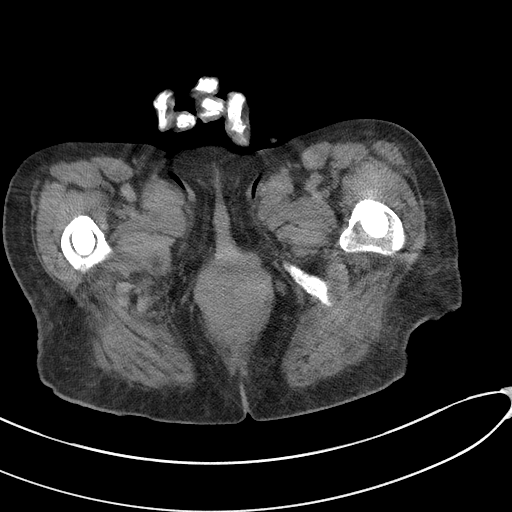
[im 3/67  bone]
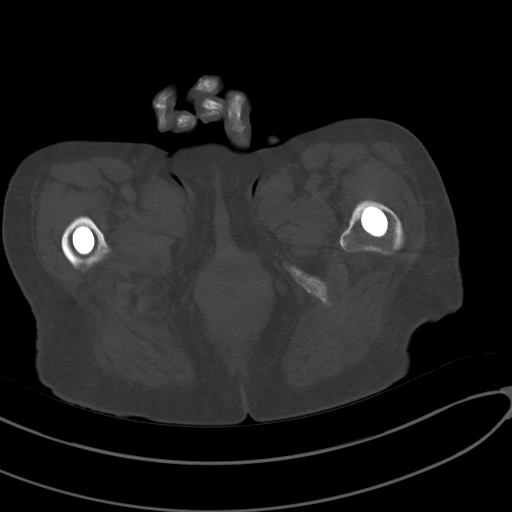
[im 8/67  soft-tissue]
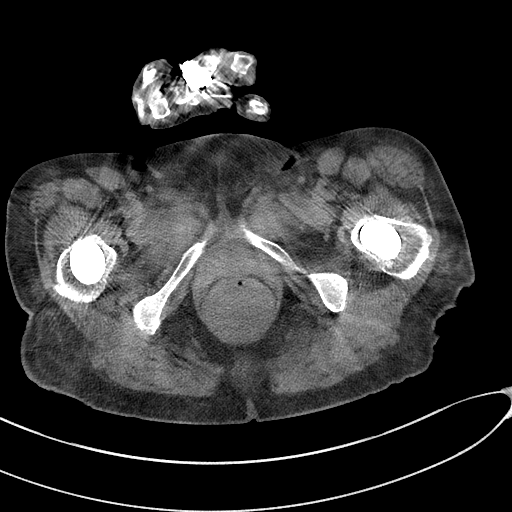
[im 13/67  soft-tissue]
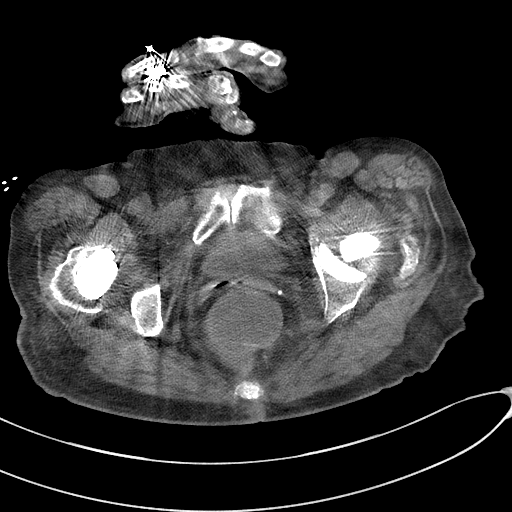
[im 18/67  soft-tissue]
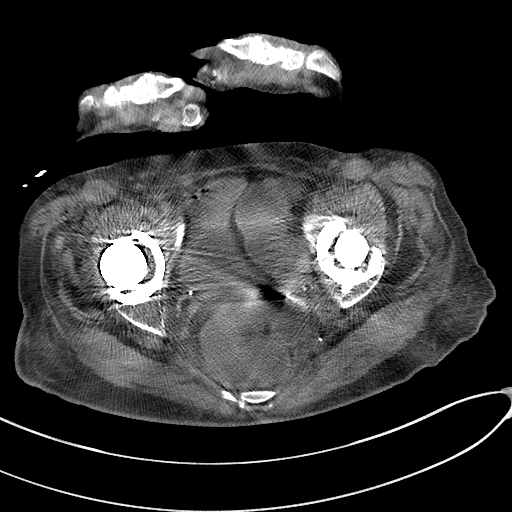
[im 23/67  soft-tissue]
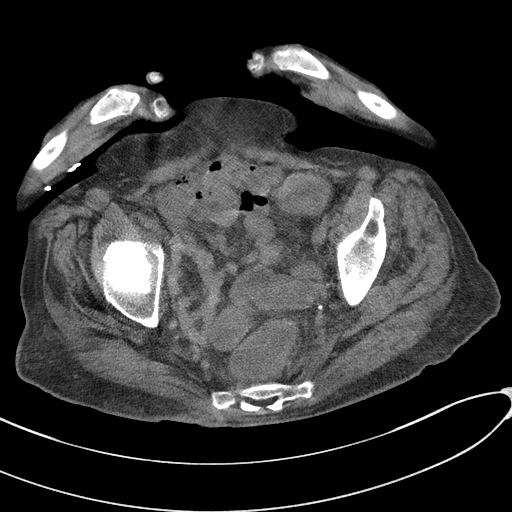
[im 28/67  soft-tissue]
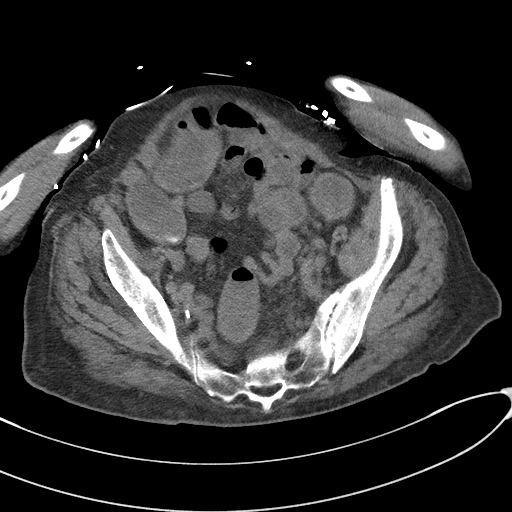
[im 34/67  soft-tissue]
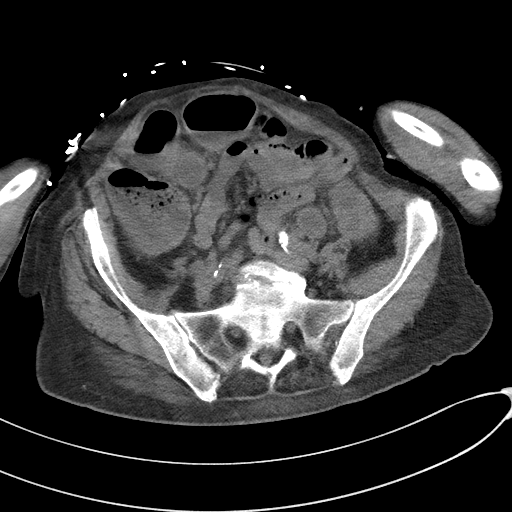
[im 39/67  soft-tissue]
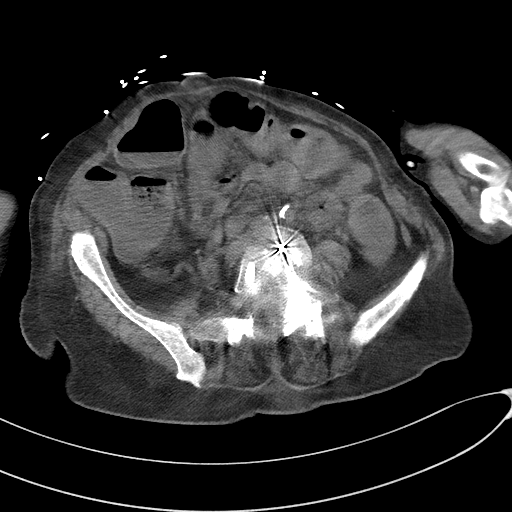
[im 44/67  soft-tissue]
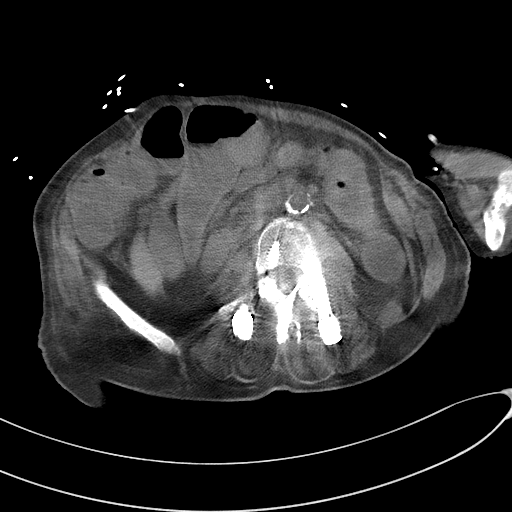
[im 44/67  bone]
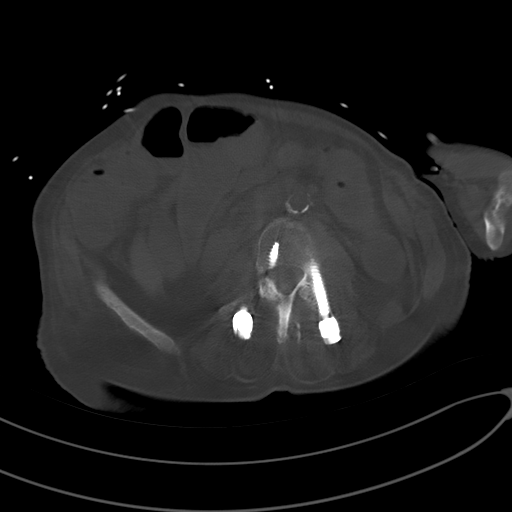
[im 49/67  soft-tissue]
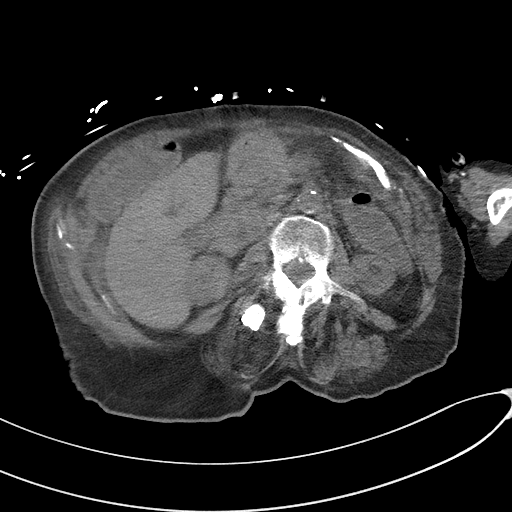
[im 54/67  soft-tissue]
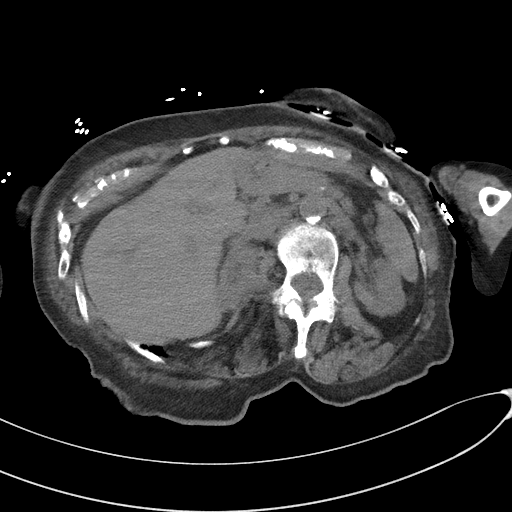
[im 59/67  soft-tissue]
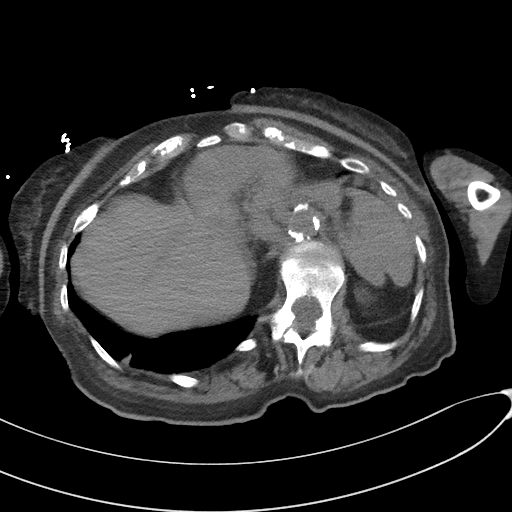
[im 64/67  soft-tissue]
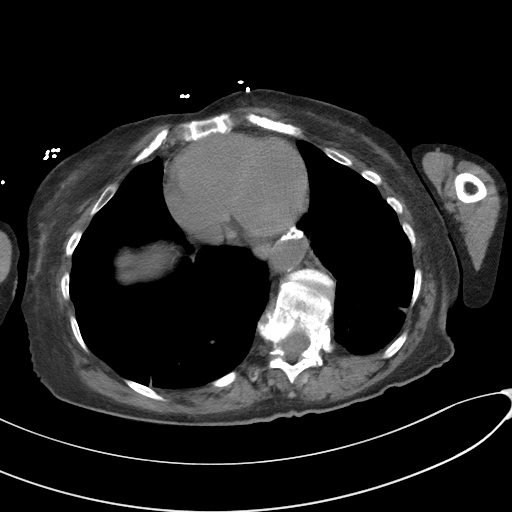

[Series 5: coronal st · coronal · 0.63mm/px · 3 of 100 slices shown]
[im 34/100  soft-tissue]
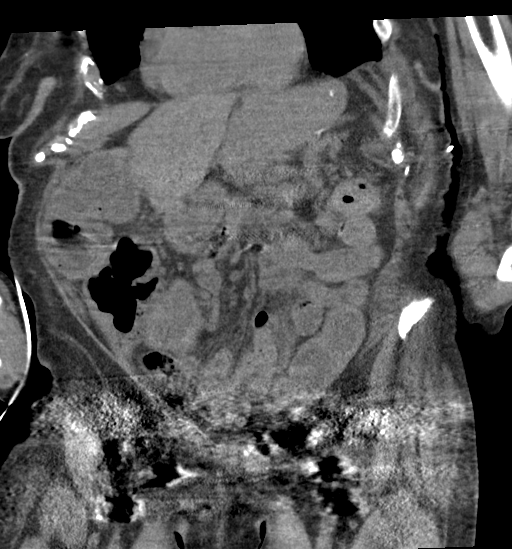
[im 45/100  soft-tissue]
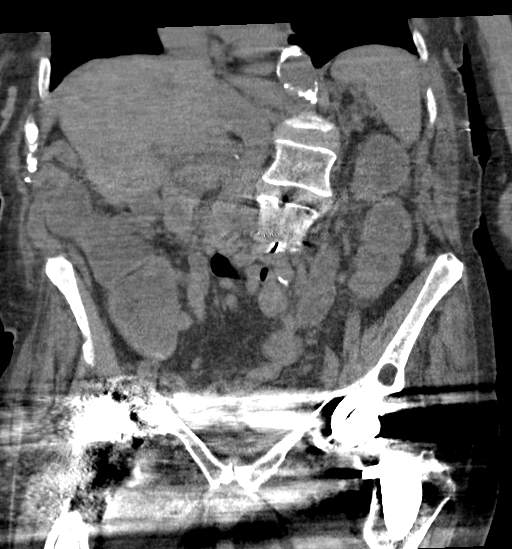
[im 56/100  soft-tissue]
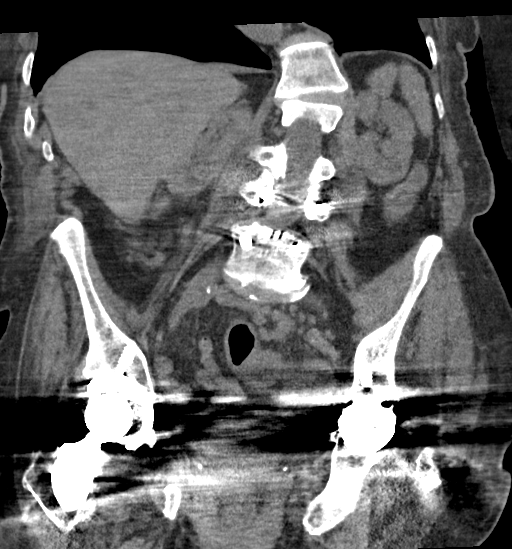

[16 of 46 positions shown; findings below may reference images not displayed]

FINDINGS: Evaluation of this exam is limited in the absence of intravenous
contrast. Evaluation is also limited due to respiratory motion
artifact as well as streak artifact caused by patient's arms and
metallic hip replacement.

Lower chest: Bibasilar linear atelectasis/scarring. There is
coronary vascular calcification.

No intra-abdominal free air or free fluid.

Hepatobiliary: The liver is grossly unremarkable. The gallbladder is
poorly visualized. A small calcific focus in the region of the porta
pedis as seen previously and may represent a small focus of vascular
calcification or a small gallstone. Ultrasound may provide better
evaluation if there is clinical concern for acute cholecystitis.

Pancreas: The pancreas is poorly visualized. No definite active
inflammatory changes.

Spleen: Normal in size without focal abnormality.

Adrenals/Urinary Tract: The adrenal glands are suboptimally
visualized. There is no hydronephrosis or nephrolithiasis on either
side. There is mild parenchyma atrophy and cortical
irregularity/scarring. The urinary bladder is grossly unremarkable.

Stomach/Bowel: There is loose stool throughout the colon compatible
with diarrheal state. Correlation with clinical exam and stool
cultures recommended. Mild thickened appearance of the colon
concerning for colitis. No bowel obstruction. The appendix is not
visualized with certainty. No inflammatory changes identified in the
right lower quadrant.

Vascular/Lymphatic: Advanced aortoiliac atherosclerotic disease. The
IVC is grossly unremarkable. No definite adenopathy.

Reproductive: Hysterectomy.

Other: Small fat containing umbilical hernia.

Musculoskeletal: Osteopenia with degenerative changes of the spine,
scoliosis, and lower lumbar posterior fusion hardware. Bilateral hip
replacements. No acute osseous pathology.
IMPRESSION: 1. Diarrheal state with findings of colitis. Correlation with
clinical exam and stool cultures recommended. No bowel obstruction.
2. No hydronephrosis or nephrolithiasis.
3. Aortic Atherosclerosis ([id number]).

## 2020-08-27 IMAGING — DX DG THORACIC SPINE 2V
3 series · 3 of 3 positions shown · non-contrast
Comparison: None.

CLINICAL DATA: Pain

EXAM:
THORACIC SPINE 2 VIEWS

[t-spine lat]
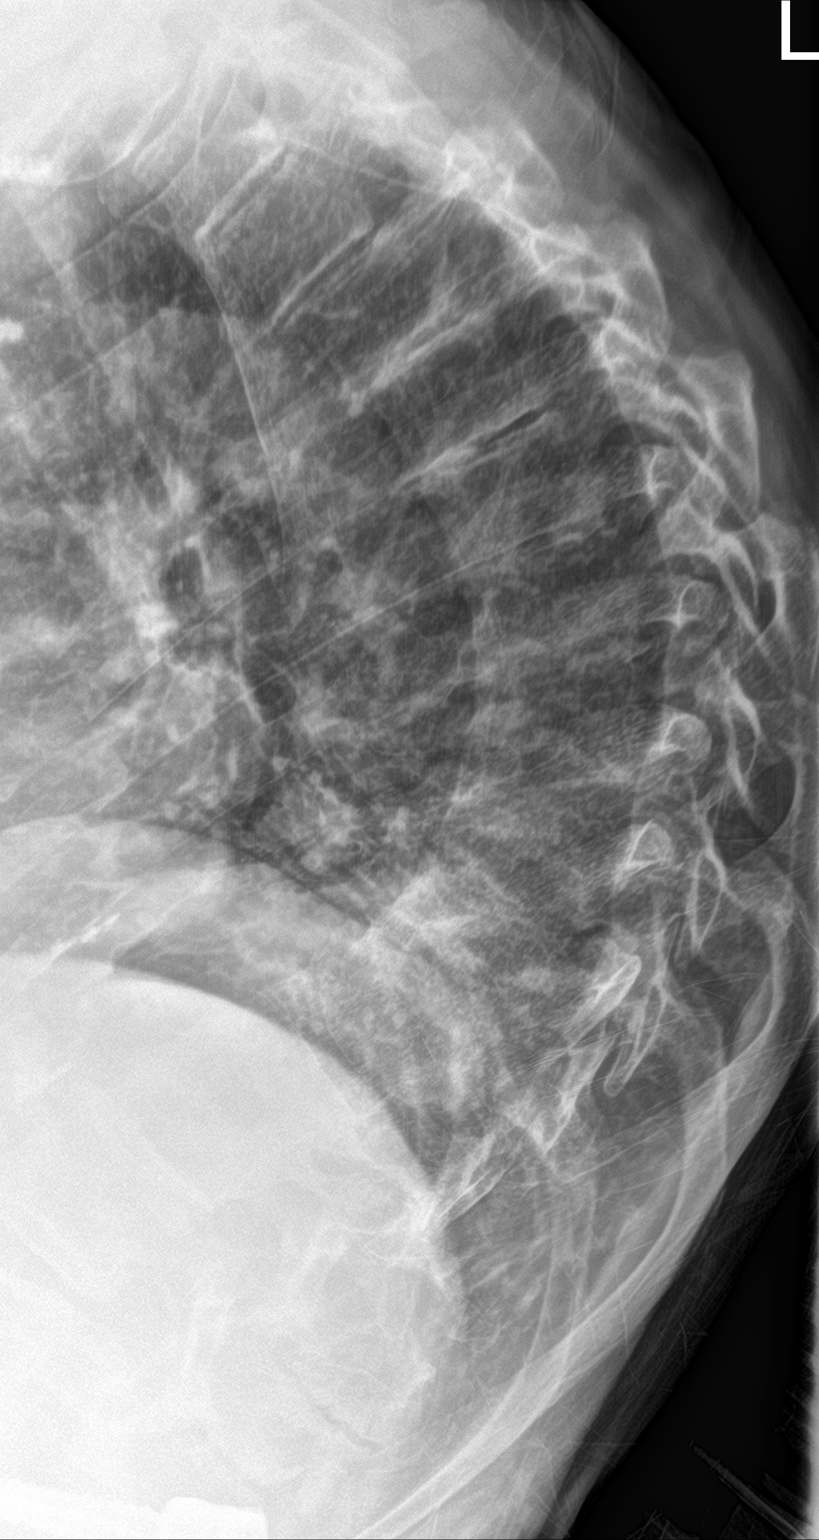

[t-spine swimmers]
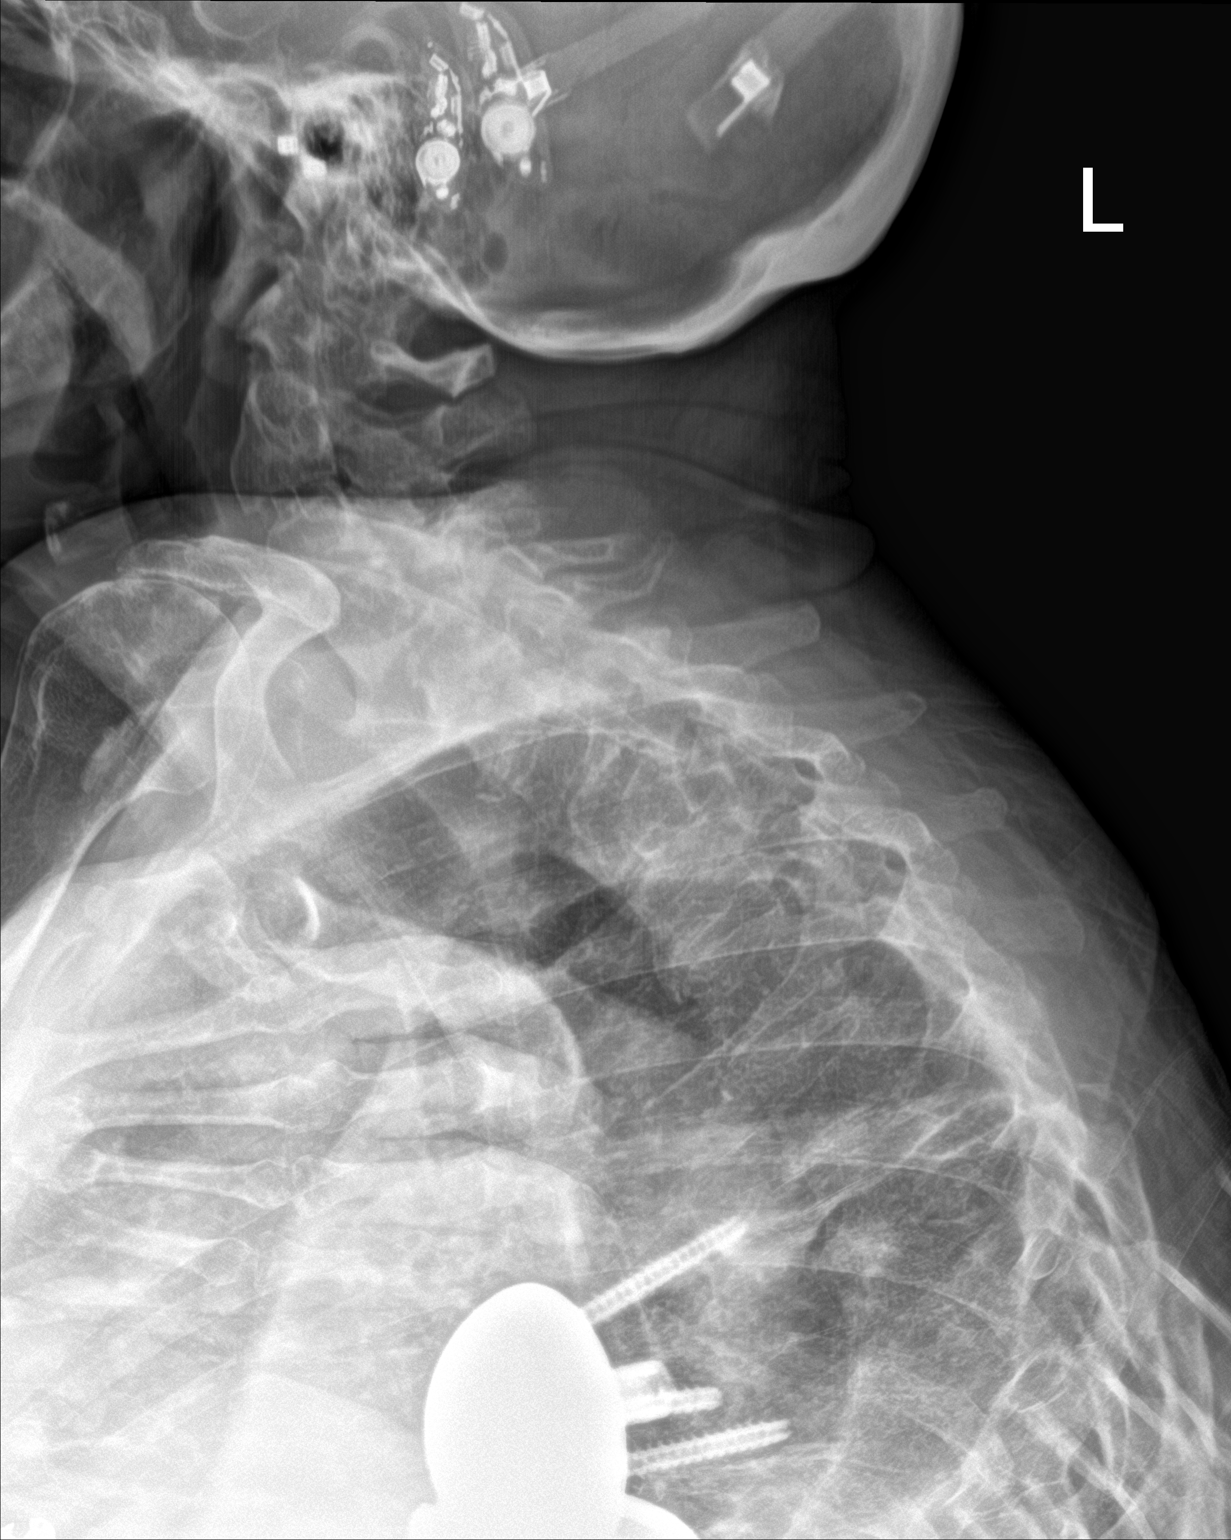

[t-spine ap]
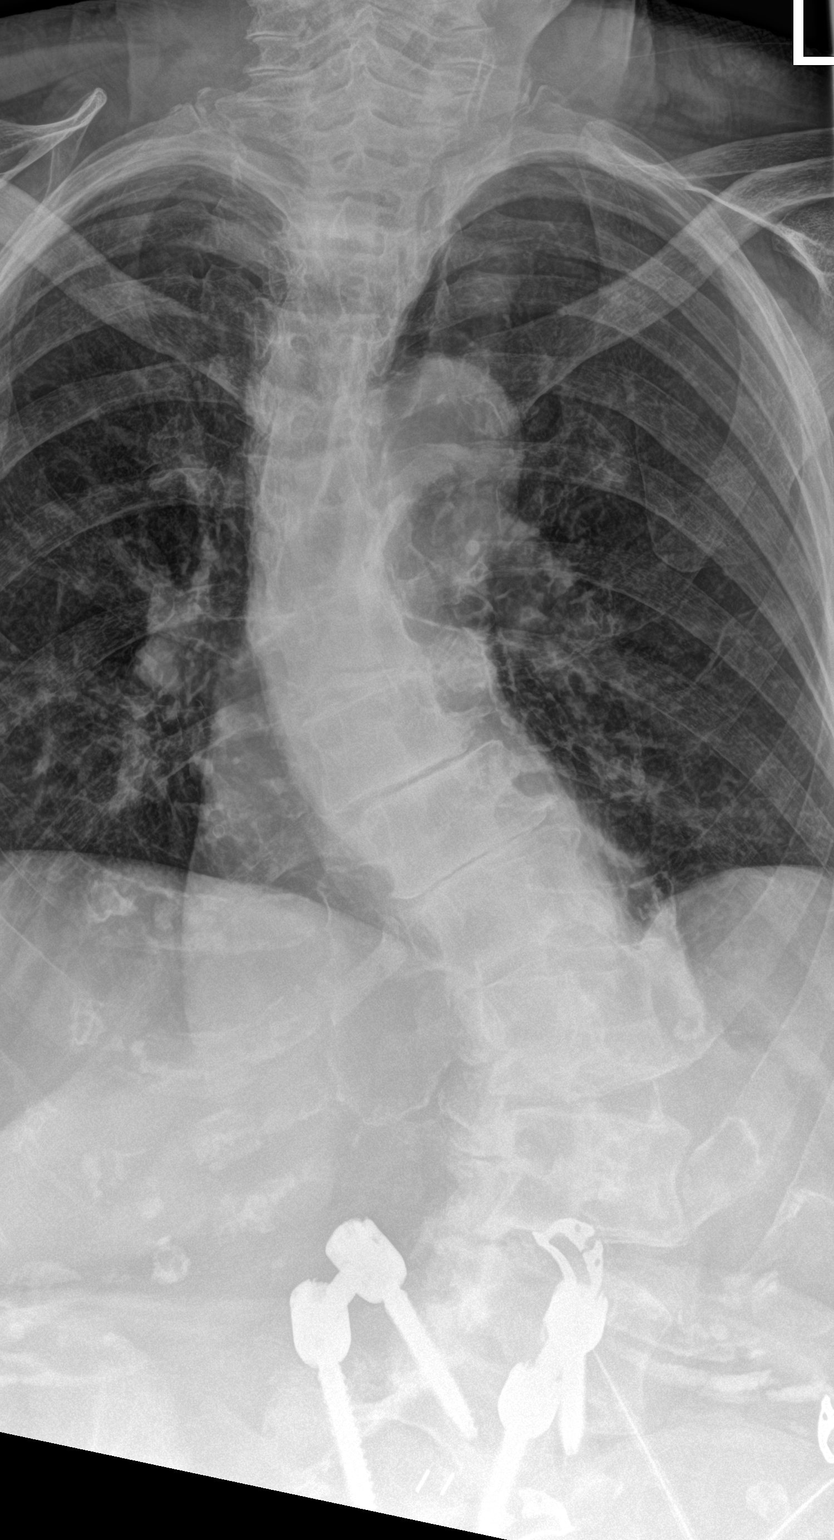

[3 of 3 positions shown; findings below may reference images not displayed]

FINDINGS: Severe thoracolumbar scoliosis. Advanced diffuse degenerative disc
disease. No acute bony abnormality. No fracture.
IMPRESSION: Severe scoliosis and advanced degenerative changes. No acute
findings.

## 2020-08-28 IMAGING — DX DG CERVICAL SPINE 2 OR 3 VIEWS
3 series · 4 of 4 positions shown · non-contrast
Comparison: None.

CLINICAL DATA: Acute pain.  No injury.

EXAM:
CERVICAL SPINE - 2-3 VIEW

[c-spine lat (1 of 2)]
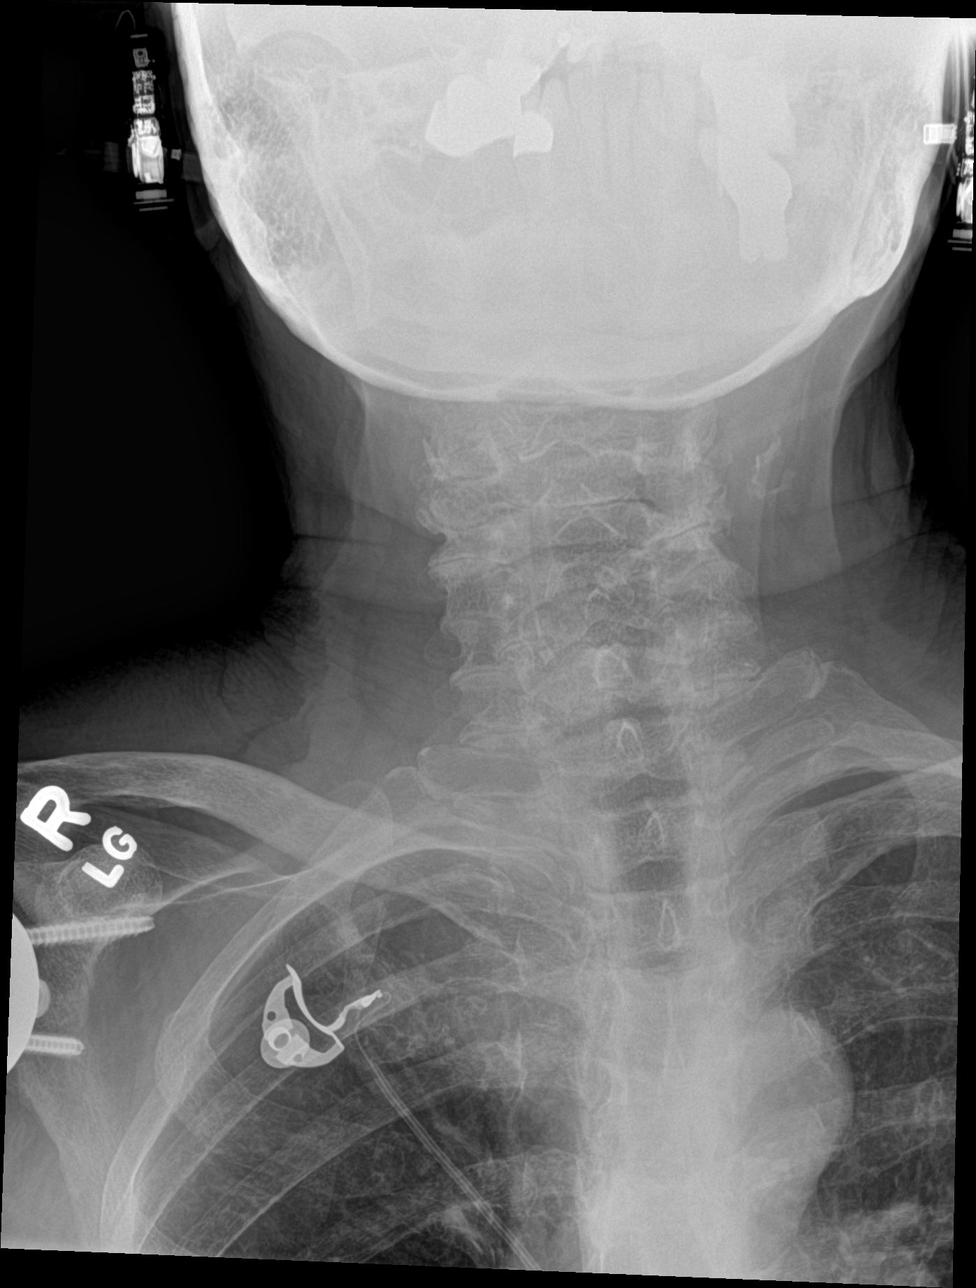

[Series 2: c-spine ap · 0.14mm/px · 2 of 2 slices shown]
[im 1/2]
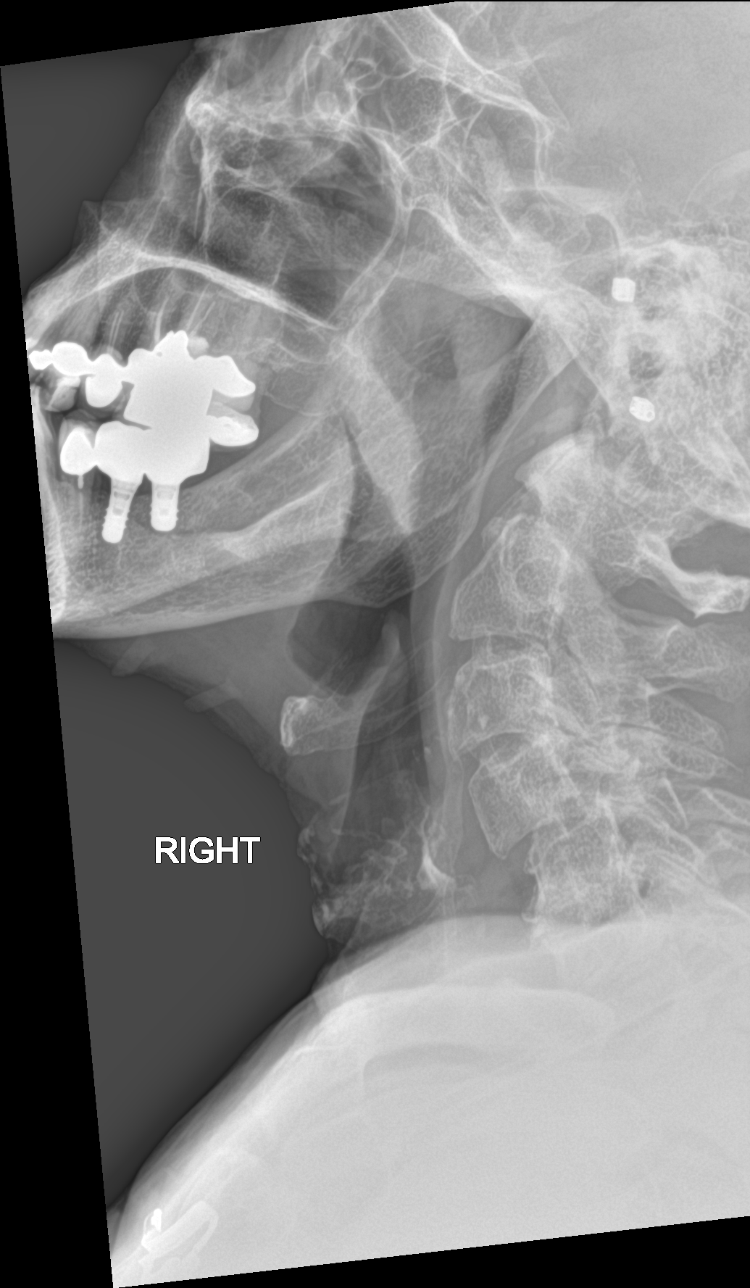
[im 2/2]
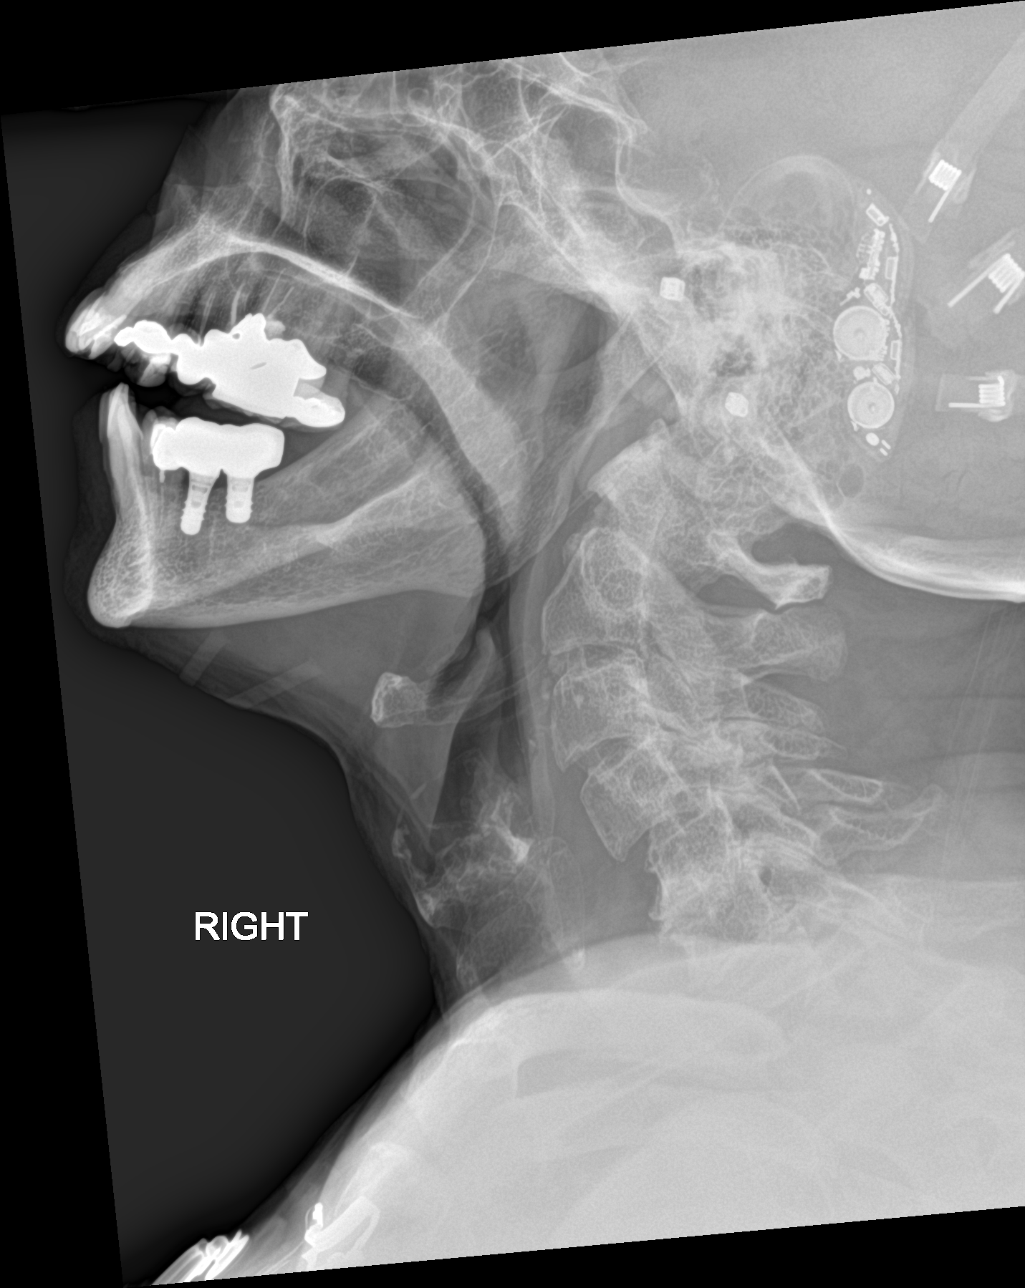

[c-spine lat (2 of 2)]
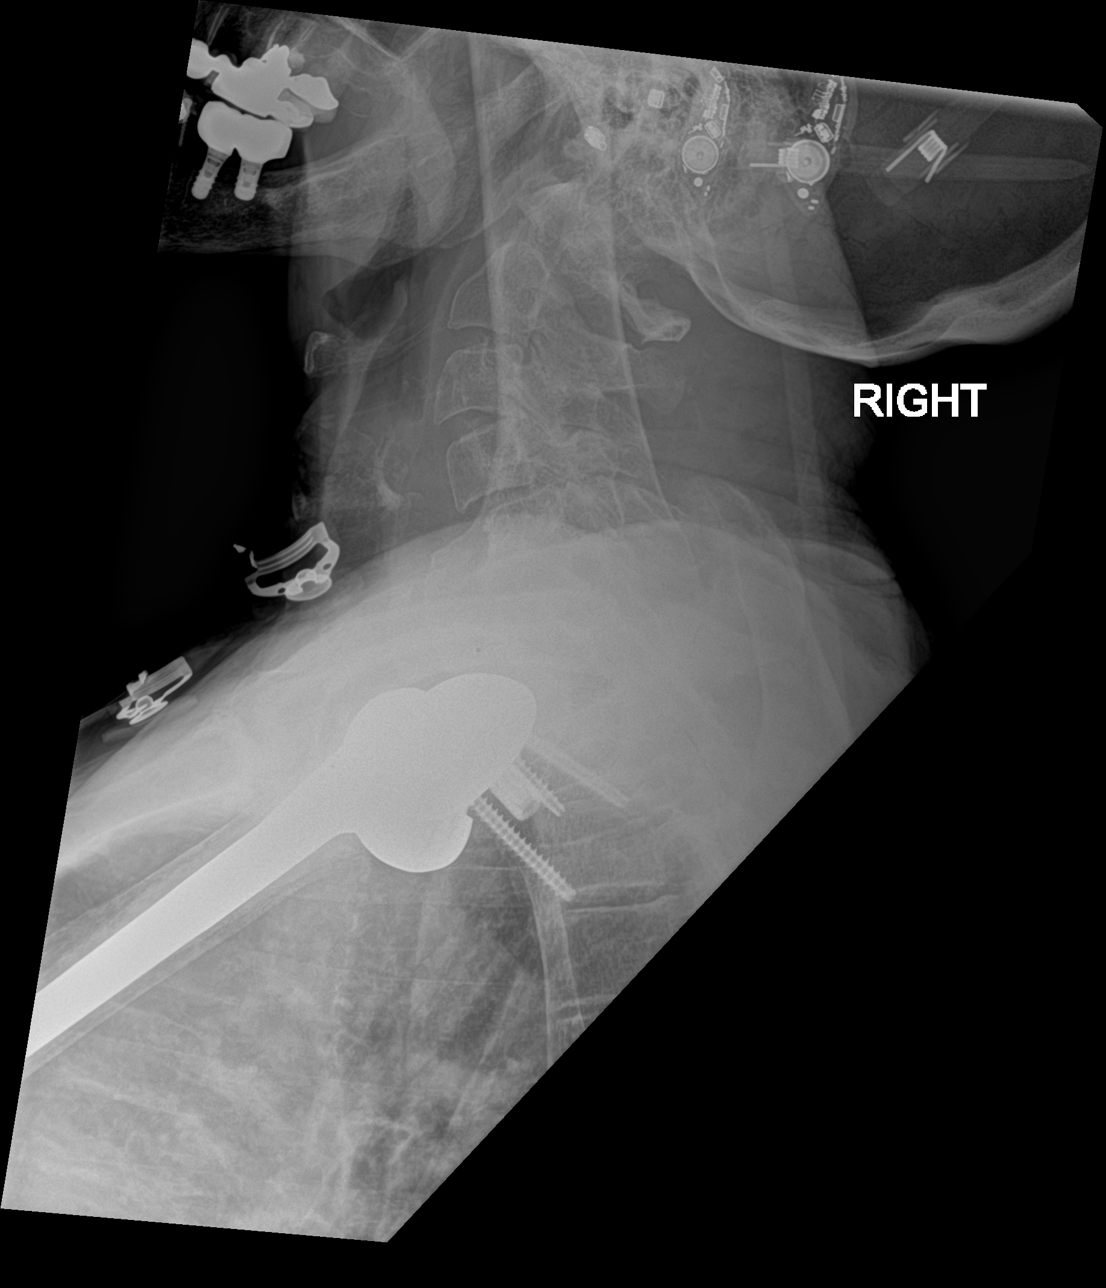

[4 of 4 positions shown; findings below may reference images not displayed]

FINDINGS: Severe advanced degenerative facet disease bilaterally. 4 mm of
anterolisthesis of C4 on C5. Degenerative disc disease in the lower
cervical spine. No fracture. Prevertebral soft tissues are normal.
IMPRESSION: Advanced degenerative facet disease and mild degenerative disc
disease. Grade 1 anterolisthesis at C4-5 likely related to facet
disease. No fracture.

## 2020-12-07 ENCOUNTER — Encounter (HOSPITAL_COMMUNITY): Payer: Self-pay

## 2020-12-07 ENCOUNTER — Emergency Department (HOSPITAL_COMMUNITY): Payer: Medicare Other

## 2020-12-07 ENCOUNTER — Emergency Department (HOSPITAL_COMMUNITY)
Admission: EM | Admit: 2020-12-07 | Discharge: 2020-12-08 | Disposition: A | Discharge status: Home / Self Care | Payer: Medicare Other | Attending: Emergency Medicine | Admitting: Emergency Medicine

## 2020-12-07 DIAGNOSIS — I129 Hypertensive chronic kidney disease with stage 1 through stage 4 chronic kidney disease, or unspecified chronic kidney disease: Secondary | ICD-10-CM | POA: Insufficient documentation

## 2020-12-07 DIAGNOSIS — Z79899 Other long term (current) drug therapy: Secondary | ICD-10-CM | POA: Insufficient documentation

## 2020-12-07 DIAGNOSIS — J45909 Unspecified asthma, uncomplicated: Secondary | ICD-10-CM | POA: Insufficient documentation

## 2020-12-07 DIAGNOSIS — R339 Retention of urine, unspecified: Secondary | ICD-10-CM

## 2020-12-07 DIAGNOSIS — Z96643 Presence of artificial hip joint, bilateral: Secondary | ICD-10-CM | POA: Diagnosis not present

## 2020-12-07 DIAGNOSIS — Z91040 Latex allergy status: Secondary | ICD-10-CM | POA: Insufficient documentation

## 2020-12-07 DIAGNOSIS — K5902 Outlet dysfunction constipation: Secondary | ICD-10-CM

## 2020-12-07 DIAGNOSIS — N183 Chronic kidney disease, stage 3 unspecified: Secondary | ICD-10-CM | POA: Insufficient documentation

## 2020-12-07 DIAGNOSIS — M6289 Other specified disorders of muscle: Secondary | ICD-10-CM

## 2020-12-07 DIAGNOSIS — K5900 Constipation, unspecified: Secondary | ICD-10-CM | POA: Diagnosis present

## 2020-12-07 DIAGNOSIS — N9489 Other specified conditions associated with female genital organs and menstrual cycle: Secondary | ICD-10-CM | POA: Insufficient documentation

## 2020-12-07 NOTE — ED Notes (Signed)
Patient returned from CT

## 2020-12-07 NOTE — ED Notes (Signed)
Patient transported to CT 

## 2020-12-07 NOTE — ED Provider Notes (Signed)
[facility] DEPT Provider Note  CSN: [id number] Arrival date & time: 12/07/20 2218  Chief Complaint(s) Constipation and Urinary Retention  HPI Penny Perez is a 87 y.o. female with a past medical history listed below including chronic pain on narcotic medication who presents to the emergency department with constipation and urinary retention.  Patient has been unable to void since Monday.  Reports that she is got urgency but every time she tries to void, she has a bowel movement.  Bowel movements are small and soft.  She is endorsing lower abdominal discomfort attributed to bearing down to try to void.  She denies any recent fevers or infections.  No coughing or congestion.  No nausea or vomiting.  No other physical complaints.  The history is provided by the patient.   Past Medical History Past Medical History:  Diagnosis Date   ALLERGIC RHINITIS    Asthma    Bronchitis    DDD (degenerative disc disease), cervical    Esophageal reflux    Fibromyalgia    Osteoarthritis    Scoliosis    Unspecified essential hypertension    Patient Active Problem List   Diagnosis Date Noted   Uremia    AKI (acute kidney injury) ([place]) 08/19/2019   Hyperosmolar Hyponatremia 08/19/2019   Normal anion gap metabolic acidosis 29/52/8413   Right rib fracture 08/19/2019   Acute on chronic renal failure w/ Uremia ([place]) 05/05/2019   Essential hypertension 05/05/2019   C. difficile colitis 05/05/2019   Scoliosis of thoracic spine 05/05/2019   Neck pain 05/05/2019   Anxiety 05/05/2019   Muscle spasms of neck 05/05/2019   Acute renal failure superimposed on stage 3 chronic kidney disease ([place]) 05/04/2019   Hormone replacement therapy (HRT) 03/22/2014   HYPERTENSION 12/07/2009   Acute bronchitis 12/07/2009   ALLERGIC RHINITIS 12/07/2009   OSTEOARTHRITIS, MULTIPLE JOINTS 12/07/2009   CAROTID BRUIT, LEFT 12/07/2009   Home Medication(s) Prior to Admission medications    Medication Sig Start Date End Date Taking? Authorizing Provider  albuterol (PROAIR HFA) 108 (90 BASE) MCG/ACT inhaler 2 puffs every 4 hours if needed- rescue inhaler Patient taking differently: Inhale 2 puffs into the lungs every 4 (four) hours as needed for wheezing or shortness of breath. 11/01/11 12/08/21 Yes Young, Tarri Fuller D, MD  budesonide-formoterol (SYMBICORT) 80-4.5 MCG/ACT inhaler Inhale 2 puffs into the lungs 2 (two) times daily. Rinse mouth Patient taking differently: Inhale 2 puffs into the lungs daily as needed (for shortness of breath). Rinse mouth 11/01/11 12/08/21 Yes Young, Tarri Fuller D, MD  Cholecalciferol (VITAMIN D3) 1000 UNITS CAPS Take 1,000 Units by mouth daily.   Yes [provider]  cromolyn (NASALCROM) 5.2 MG/ACT nasal spray Place 1 spray into the nose 4 (four) times daily as needed for allergies or rhinitis.    Yes [provider]  diazepam (VALIUM) 10 MG tablet Take 1 tablet by mouth as needed for anxiety or sleep.  09/04/12  Yes [provider]  estradiol (VIVELLE-DOT) 0.05 MG/24HR patch APPLY 1 PATCH EXTERNALLY TO THE SKIN EVERY 72 HOURS Patient taking differently: Place 1 patch onto the skin every 3 (three) days. 04/10/16  Yes Dena Billet B, PA-C  fluocinonide cream (LIDEX) 2.44 % Apply 1 application topically daily as needed (itching). 04/23/19  Yes [provider]  levothyroxine (SYNTHROID, LEVOTHROID) 100 MCG tablet Take 100 mcg by mouth every other day.    Yes [provider]  MOVANTIK 25 MG TABS tablet Take 1 tablet (25 mg total) by mouth  daily as needed (constipation). 05/08/19  Yes Rai, Ripudeep K, MD  nystatin (MYCOSTATIN) powder Apply topically 2 (two) times daily. To effected areas Patient taking differently: Apply topically 2 (two) times daily as needed (skin irritation). To effected areas 12/01/14  Yes Dena Billet B, PA-C  omeprazole (PRILOSEC) 40 MG capsule Take 40 mg by mouth daily.  01/21/14  Yes [provider]   polyethylene glycol powder (MIRALAX) 17 GM/SCOOP powder Start taking 1 capful 3 times a day. Slowly cut back as needed until you have normal bowel movements. 12/08/20  Yes Bettyanne Dittman, Grayce Sessions, MD  SYNTHROID 112 MCG tablet Take 112 mcg by mouth every other day. 07/17/19  Yes [provider]  conjugated estrogens (PREMARIN) vaginal cream USE AS DIRECTED Patient not taking: Reported on 12/08/2020 05/05/15   Orlena Sheldon, PA-C  Oxycodone HCl 10 MG TABS Take 10 mg by mouth 2 (two) times daily. 12/02/20   [provider]  Spacer/Aero-Holding Chambers (AEROCHAMBER MV) inhaler by Other route. Use as instructed     [provider]                                                                                                                                    Past Surgical History Past Surgical History:  Procedure Laterality Date   APPENDECTOMY     BACK SURGERY  2013   lumb fusion   COLONOSCOPY     ERCP     IR FLUORO GUIDE CV LINE RIGHT  08/20/2019   IR US GUIDE VASC ACCESS RIGHT  08/20/2019   ORIF TOE FRACTURE  02/27/2012   Procedure: OPEN REDUCTION INTERNAL FIXATION (ORIF) METATARSAL (TOE) FRACTURE;  Surgeon: Colin Rhein, MD;  Location: [facility];  Service: Orthopedics;  Laterality: Right;  RIGHT OPEN REDUCTION INTERNAL FIXATION NON UNION 1ST METATARSAL NECK,FLEXOR HALLUCIS LONGUS TO PROXIMAL PHALANAX TENDON TRANSFER,  GASTROC SLIDE,  LOCAL BONE GRAFT    SHOULDER ARTHROSCOPY  2011   rt   TENDON TRANSFER  02/27/2012   Procedure: TENDON TRANSFER;  Surgeon: Colin Rhein, MD;  Location: [facility];  Service: Orthopedics;  Laterality: Right;   TOTAL ABDOMINAL HYSTERECTOMY     TOTAL ABDOMINAL HYSTERECTOMY W/ BILATERAL SALPINGOOPHORECTOMY     TOTAL HIP ARTHROPLASTY  03,09   rt and lt   TOTAL HIP REVISION     Family History Family History  Problem Relation Age of Onset   Asthma Mother    Ovarian cancer Mother    Pancreatic cancer Father      Social History Social History   Tobacco Use   Smoking status: Never   Smokeless tobacco: Never  Substance Use Topics   Alcohol use: No   Drug use: No   Allergies Linezolid, Sulfa antibiotics, Ampicillin, Cephalexin, Clarithromycin, Codeine, Doxycycline, Erythromycin, Gatifloxacin, Hydrocodone, Penicillins, Latex, and Tape  Review of Systems Review of Systems All other systems are reviewed and  are negative for acute change except as noted in the HPI  Physical Exam Vital Signs  I have reviewed the triage vital signs BP 135/60 (BP Location: Right Arm)   Pulse 61   Temp 98.2 F (36.8 C) (Oral)   Resp 19   SpO2 96%   Physical Exam Vitals reviewed.  Constitutional:      General: She is not in acute distress.    Appearance: She is well-developed. She is not diaphoretic.  HENT:     Head: Normocephalic and atraumatic.     Right Ear: External ear normal.     Left Ear: External ear normal.     Nose: Nose normal.  Eyes:     General: No scleral icterus.    Conjunctiva/sclera: Conjunctivae normal.  Neck:     Trachea: Phonation normal.  Cardiovascular:     Rate and Rhythm: Normal rate and regular rhythm.  Pulmonary:     Effort: Pulmonary effort is normal. No respiratory distress.     Breath sounds: No stridor.  Abdominal:     General: There is distension.     Tenderness: There is abdominal tenderness in the suprapubic area. There is no guarding or rebound.  Musculoskeletal:        General: Normal range of motion.     Cervical back: Normal range of motion.  Neurological:     Mental Status: She is alert and oriented to person, place, and time.     Comments: Hard of hearing  Psychiatric:        Behavior: Behavior normal.    ED Results and Treatments Labs (all labs ordered are listed, but only abnormal results are displayed) Labs Reviewed  CBC WITH DIFFERENTIAL/PLATELET - Abnormal; Notable for the following components:      Result Value   WBC 14.9 (*)    RDW 18.8  (*)    Neutro Abs 12.9 (*)    Abs Immature Granulocytes 0.13 (*)    All other components within normal limits  BASIC METABOLIC PANEL - Abnormal; Notable for the following components:   Sodium 128 (*)    Chloride 96 (*)    Glucose, Bld 126 (*)    GFR, Estimated 57 (*)    All other components within normal limits  URINALYSIS, ROUTINE W REFLEX MICROSCOPIC - Abnormal; Notable for the following components:   Protein, ur 30 (*)    Bacteria, UA RARE (*)    All other components within normal limits  I-STAT CHEM 8, ED  I-STAT BETA HCG BLOOD, ED ([facility], [facility], [facility] ONLY)                                                                                                                         EKG  EKG Interpretation  Date/Time:    Ventricular Rate:    PR Interval:    QRS Duration:   QT Interval:    QTC Calculation:   R Axis:     Text Interpretation:  Radiology CT ABDOMEN PELVIS W CONTRAST  Result Date: 12/08/2020 CLINICAL DATA:  Left lower quadrant abdominal pain. EXAM: CT ABDOMEN AND PELVIS WITH CONTRAST TECHNIQUE: Multidetector CT imaging of the abdomen and pelvis was performed using the standard protocol following bolus administration of intravenous contrast. CONTRAST:  3mL OMNIPAQUE IOHEXOL 350 MG/ML SOLN COMPARISON:  08/19/2019. FINDINGS: Lower chest: The heart is normal in size and a few scattered coronary artery calcifications are noted. There is subsegmental atelectasis or scarring at the lung bases bilaterally. Hepatobiliary: No focal liver abnormality is seen. No gallstones, gallbladder wall thickening, or biliary dilatation. Pancreas: Pancreatic atrophy is noted. No biliary ductal dilatation is seen. Spleen: Normal in size without focal abnormality. Adrenals/Urinary Tract: No adrenal nodule or mass. No renal calculus is seen bilaterally. There is fullness of the renal pelvises bilaterally, greater on the right than on the left. The visualized proximal ureters are normal in caliber.  The mid to distal ureters are not visualized on exam due to paucity of intra-abdominal fat. The kidneys enhance symmetrically. Subcentimeter hypodensities are noted in the left kidney which are too small to further characterize. The urinary bladder is nondistended and a Foley catheter is in place. Evaluation of the urinary bladder is limited due to bilateral hip prosthesis. Stomach/Bowel: No bowel obstruction, free air or pneumatosis. There is a large amount of stool in the sigmoid colon and rectum. Perirectal fat stranding is noted. The rectum extends below the pubococcygeal line and out of the field of view. The appendix is not visualized on exam. The stomach is decompressed. Vascular/Lymphatic: Aortic atherosclerosis. No enlarged abdominal or pelvic lymph nodes. Reproductive: The uterus is not visualized on exam. Other: No free fluid in the pelvis. Musculoskeletal: Bilateral hip prosthesis are noted. There is levoscoliosis with degenerative changes in the thoracolumbar spine. Spinal fusion hardware is present at L4-L5. IMPRESSION: 1. Large amount of stool in the sigmoid colon and rectum. There is rectal wall thickening in the rectum cyst extends below the level of the pubococcygeal line, pelvis, and out of the field of view, compatible with pelvic floor dysfunction. Correlate clinically to exclude fecal impaction. 2. Mild fullness of the renal pelvises bilaterally, possible extrarenal pelvises versus mild hydronephrosis. 3. Strandy atelectasis or scarring at the lung bases. 4. Coronary artery calcifications. Electronically Signed   By: Brett Fairy M.D.   On: 12/08/2020 02:39   DG Abdomen Acute W/Chest  Result Date: 12/07/2020 CLINICAL DATA:  Abdominal distension. Complaint of constipation and urinary retention since [place] EXAM: DG ABDOMEN ACUTE WITH 1 VIEW CHEST COMPARISON:  Chest x-ray 08/19/2019, CT abdomen pelvis 08/19/2019 FINDINGS: 4 cm metallic clip overlying mediastinum likely external to the  patient. The heart and mediastinal contours are within normal limits. Aortic calcification. Slightly increased perihilar interstitial markings. Question vague airspace opacity within the right lower lung zone. No pleural effusion. No pneumothorax. Suggestion of large stool burden within the rectosigmoid colon. There is no evidence of dilated bowel loops or free intraperitoneal air. Couple punctate calcifications overlying the right upper quadrant consistent with known porta hepatic calcifications. Phleboliths noted overlying the pelvis. No radiopaque calculi or other significant radiographic abnormality is seen. No acute osseous abnormality. Lumbar surgical hardware. Bilateral total hip arthroplasties. Reversed total right shoulder arthroplasty. IMPRESSION: 1. Slightly increased perihilar interstitial markings. Question vague airspace opacity within the right lower lung zone. Findings may represent infection/inflammation. Followup PA and lateral chest X-ray is recommended in 3-4 weeks following therapy to ensure resolution. 2. Nonobstructive bowel gas pattern. Suggestion of large stool  burden within the rectosigmoid colon. 3. A 4 cm metallic clip overlying mediastinum likely external to the patient. Please correlate with physical exam. Electronically Signed   By: Iven Finn M.D.   On: 12/07/2020 23:55    Pertinent labs & imaging results that were available during my care of the patient were reviewed by me and considered in my medical decision making (see MDM for details).  Medications Ordered in ED Medications  iohexol (OMNIPAQUE) 350 MG/ML injection 80 mL (80 mLs Intravenous Contrast Given 12/08/20 0204)  HYDROmorphone (DILAUDID) injection 0.5 mg (0.5 mg Intravenous Given 12/08/20 0302)                                                                                                                                     Procedures Procedures  (including critical care time)  Medical Decision Making /  ED Course I have reviewed the nursing notes for this encounter and the patient's prior records (if available in EHR or on provided paperwork).  Margorie Renner was evaluated in Emergency Department on 12/08/2020 for the symptoms described in the history of present illness. She was evaluated in the context of the global COVID-19 pandemic, which necessitated consideration that the patient might be at risk for infection with the SARS-CoV-2 virus that causes COVID-19. Institutional protocols and algorithms that pertain to the evaluation of patients at risk for COVID-19 are in a state of rapid change based on information released by regulatory bodies including the CDC and federal and state organizations. These policies and algorithms were followed during the patient's care in the ED.     Constipation and urinary retention. Abdominal distention. Lower abdominal discomfort to palpation. Bladder scan notable for greater than 500 cc in the bladder. Foley inserted ultimately retrieving 800 cc of clear urine.  Pertinent labs & imaging results that were available during my care of the patient were reviewed by me and considered in my medical decision making:  Labs notable for leukocytosis.  No anemia.  Mild hyponatremia close to her baseline on metabolic panel.  No evidence of renal sufficiency. UA without evidence of infection CT scan obtain to assess for diverticulitis.  Negative for any acute intra-abdominal inflammatory/infectious process or bowel obstruction.  Patient does have significant amount of colonic stool burden and evidence to suggest pelvic floor dysfunction. Patient declined DRE to assess for impaction.  Urology follow for retention which is likely due to pelvic floor dysfunction and constipation. Recommend close PCP follow up for further assessment and management of the pelvic floor dysfunction.  Final Clinical Impression(s) / ED Diagnoses Final diagnoses:  Urinary retention   Constipation due to outlet dysfunction  Pelvic floor dysfunction in female   The patient appears reasonably screened and/or stabilized for discharge and I doubt any other medical condition or other [facility] requiring further screening, evaluation, or treatment in the ED at this time prior to discharge. Safe for discharge with strict return  precautions.  Disposition: Discharge  Condition: Good  I have discussed the results, Dx and Tx plan with the patient/family who expressed understanding and agree(s) with the plan. Discharge instructions discussed at length. The patient/family was given strict return precautions who verbalized understanding of the instructions. No further questions at time of discharge.    ED Discharge Orders          Ordered    polyethylene glycol powder (MIRALAX) 17 GM/SCOOP powder        12/08/20 0443             Follow Up: Raynelle Bring, MD [place] [address] [phone]  Call  to schedule an appointment for close follow up urinary retention  Chesley Noon, MD [address] [place] [address] [phone]  Call  to schedule an appointment for close follow up for additional referrals for pelvic floor dysfunction     This chart was dictated using voice recognition software.  Despite best efforts to proofread,  errors can occur which can change the documentation meaning.    Fatima Blank, MD 12/08/20 [phone]

## 2020-12-07 NOTE — ED Triage Notes (Signed)
Patient arrives via EMS from home with complaint of constipation and urinary retention since Monday. Pt reports taking oxycodone at home but has not taken a dose since Monday due to constipation. EMS reports abdomen is distended but pt's family reports that is normal.

## 2020-12-08 ENCOUNTER — Other Ambulatory Visit: Payer: Self-pay

## 2020-12-08 ENCOUNTER — Encounter (HOSPITAL_COMMUNITY): Payer: Self-pay

## 2020-12-08 ENCOUNTER — Emergency Department (HOSPITAL_COMMUNITY): Payer: Medicare Other

## 2020-12-08 LAB — BASIC METABOLIC PANEL
Anion gap: 8 (ref 5–15)
BUN: 23 mg/dL (ref 8–23)
CO2: 24 mmol/L (ref 22–32)
Calcium: 9.4 mg/dL (ref 8.9–10.3)
Chloride: 96 mmol/L — ABNORMAL LOW (ref 98–111)
Creatinine, Ser: 0.97 mg/dL (ref 0.44–1.00)
GFR, Estimated: 57 mL/min — ABNORMAL LOW (ref >60)
Glucose, Bld: 126 mg/dL — ABNORMAL HIGH (ref 70–99)
Potassium: 3.6 mmol/L (ref 3.5–5.1)
Sodium: 128 mmol/L — ABNORMAL LOW (ref 135–145)

## 2020-12-08 LAB — CBC WITH DIFFERENTIAL/PLATELET
Abs Immature Granulocytes: 0.13 10*3/uL — ABNORMAL HIGH (ref 0.00–0.07)
Basophils Absolute: 0.1 10*3/uL (ref 0.0–0.1)
Basophils Relative: 0 %
Eosinophils Absolute: 0 10*3/uL (ref 0.0–0.5)
Eosinophils Relative: 0 %
HCT: 37.9 % (ref 36.0–46.0)
Hemoglobin: 12.3 g/dL (ref 12.0–15.0)
Immature Granulocytes: 1 %
Lymphocytes Relative: 7 %
Lymphs Abs: 1.1 10*3/uL (ref 0.7–4.0)
MCH: 28 pg (ref 26.0–34.0)
MCHC: 32.5 g/dL (ref 30.0–36.0)
MCV: 86.1 fL (ref 80.0–100.0)
Monocytes Absolute: 0.7 10*3/uL (ref 0.1–1.0)
Monocytes Relative: 5 %
Neutro Abs: 12.9 10*3/uL — ABNORMAL HIGH (ref 1.7–7.7)
Neutrophils Relative %: 87 %
Platelets: 274 10*3/uL (ref 150–400)
RBC: 4.4 MIL/uL (ref 3.87–5.11)
RDW: 18.8 % — ABNORMAL HIGH (ref 11.5–15.5)
WBC: 14.9 10*3/uL — ABNORMAL HIGH (ref 4.0–10.5)
nRBC: 0 % (ref 0.0–0.2)

## 2020-12-08 LAB — URINALYSIS, ROUTINE W REFLEX MICROSCOPIC
Bilirubin Urine: NEGATIVE
Glucose, UA: NEGATIVE mg/dL
Hgb urine dipstick: NEGATIVE
Ketones, ur: NEGATIVE mg/dL
Leukocytes,Ua: NEGATIVE
Nitrite: NEGATIVE
Protein, ur: 30 mg/dL — AB
Specific Gravity, Urine: 1.011 (ref 1.005–1.030)
pH: 6 (ref 5.0–8.0)

## 2020-12-08 LAB — I-STAT BETA HCG BLOOD, ED (MC, WL, AP ONLY): I-stat hCG, quantitative: <5 m[IU]/mL (ref <5)

## 2020-12-08 MED ORDER — HYDROMORPHONE HCL 1 MG/ML IJ SOLN
0.5000 mg | Freq: Once | INTRAMUSCULAR | Status: AC
  Administered 2020-12-08: 0.5 mg via INTRAVENOUS
  Filled 2020-12-08: qty 1

## 2020-12-08 MED ORDER — IOHEXOL 350 MG/ML SOLN
80.0000 mL | Freq: Once | INTRAVENOUS | Status: AC | PRN
  Administered 2020-12-08: 80 mL via INTRAVENOUS

## 2020-12-08 MED ORDER — POLYETHYLENE GLYCOL 3350 17 GM/SCOOP PO POWD: ORAL | 0 refills | Status: AC

## 2020-12-10 ENCOUNTER — Other Ambulatory Visit: Payer: Self-pay

## 2020-12-10 ENCOUNTER — Emergency Department (HOSPITAL_COMMUNITY): Payer: Medicare Other

## 2020-12-10 ENCOUNTER — Emergency Department (HOSPITAL_COMMUNITY)
Admission: EM | Admit: 2020-12-10 | Discharge: 2020-12-10 | Disposition: A | Discharge status: Home / Self Care | Payer: Medicare Other | Attending: Emergency Medicine | Admitting: Emergency Medicine

## 2020-12-10 DIAGNOSIS — Z91040 Latex allergy status: Secondary | ICD-10-CM | POA: Insufficient documentation

## 2020-12-10 DIAGNOSIS — W06XXXA Fall from bed, initial encounter: Secondary | ICD-10-CM | POA: Insufficient documentation

## 2020-12-10 DIAGNOSIS — S51011A Laceration without foreign body of right elbow, initial encounter: Secondary | ICD-10-CM | POA: Insufficient documentation

## 2020-12-10 DIAGNOSIS — Z96643 Presence of artificial hip joint, bilateral: Secondary | ICD-10-CM | POA: Diagnosis not present

## 2020-12-10 DIAGNOSIS — Z23 Encounter for immunization: Secondary | ICD-10-CM | POA: Diagnosis not present

## 2020-12-10 DIAGNOSIS — S81812A Laceration without foreign body, left lower leg, initial encounter: Secondary | ICD-10-CM | POA: Diagnosis not present

## 2020-12-10 DIAGNOSIS — J45909 Unspecified asthma, uncomplicated: Secondary | ICD-10-CM | POA: Insufficient documentation

## 2020-12-10 DIAGNOSIS — I129 Hypertensive chronic kidney disease with stage 1 through stage 4 chronic kidney disease, or unspecified chronic kidney disease: Secondary | ICD-10-CM | POA: Diagnosis not present

## 2020-12-10 DIAGNOSIS — W19XXXA Unspecified fall, initial encounter: Secondary | ICD-10-CM

## 2020-12-10 DIAGNOSIS — S59901A Unspecified injury of right elbow, initial encounter: Secondary | ICD-10-CM | POA: Diagnosis present

## 2020-12-10 DIAGNOSIS — N183 Chronic kidney disease, stage 3 unspecified: Secondary | ICD-10-CM | POA: Insufficient documentation

## 2020-12-10 DIAGNOSIS — K5909 Other constipation: Secondary | ICD-10-CM

## 2020-12-10 MED ORDER — FLEET ENEMA 7-19 GM/118ML RE ENEM
1.0000 | ENEMA | Freq: Once | RECTAL | Status: AC
  Administered 2020-12-10: 1 via RECTAL
  Filled 2020-12-10: qty 1

## 2020-12-10 MED ORDER — OXYCODONE-ACETAMINOPHEN 5-325 MG PO TABS
1.0000 | ORAL_TABLET | Freq: Once | ORAL | Status: AC
  Administered 2020-12-10: 1 via ORAL
  Filled 2020-12-10: qty 1

## 2020-12-10 MED ORDER — MAGNESIUM CITRATE PO SOLN: 0.5000 | Freq: Once | ORAL | 0 refills | Status: AC

## 2020-12-10 MED ORDER — TETANUS-DIPHTH-ACELL PERTUSSIS 5-2.5-18.5 LF-MCG/0.5 IM SUSY
0.5000 mL | PREFILLED_SYRINGE | Freq: Once | INTRAMUSCULAR | Status: AC
  Administered 2020-12-10: 0.5 mL via INTRAMUSCULAR
  Filled 2020-12-10: qty 0.5

## 2020-12-10 NOTE — ED Provider Notes (Signed)
[facility] EMERGENCY DEPARTMENT Provider Note   CSN: [id number] Arrival date & time: 12/10/20  4765     History Chief Complaint  Patient presents with  . Fall    Penny Perez is a 87 y.o. female.  HPI     This is an 87 year old female with a history of chronic pain, arthritis, recent history of urinary retention status post Foley catheter placement who presents following a fall.  Patient reports that she got up to go to the restroom.  She lives alone.  She states she has hemorrhoids and because of the pain related to her hemorrhoids, "I just fell.  She reports that she fell from sitting on her bed.  She hit her left leg on a stool that she uses to get into bed.  She also hit her right arm.  She is not on any blood thinners.  She denies hitting her head or loss of consciousness.  She denies weakness, numbness, tingling, chest pain, syncope.  Patient was brought in by EMS who noted a large skin tear to the left lower leg and the skin tear to the right elbow.  Patient reports pain all over.  She rates her pain 8 out of 10  Past Medical History:  Diagnosis Date  . ALLERGIC RHINITIS   . Asthma   . Bronchitis   . DDD (degenerative disc disease), cervical   . Esophageal reflux   . Fibromyalgia   . Osteoarthritis   . Scoliosis   . Unspecified essential hypertension     Patient Active Problem List   Diagnosis Date Noted  . Uremia   . AKI (acute kidney injury) ([place]) 08/19/2019  . Hyperosmolar Hyponatremia 08/19/2019  . Normal anion gap metabolic acidosis 46/50/3546  . Right rib fracture 08/19/2019  . Acute on chronic renal failure w/ Uremia ([place]) 05/05/2019  . Essential hypertension 05/05/2019  . C. difficile colitis 05/05/2019  . Scoliosis of thoracic spine 05/05/2019  . Neck pain 05/05/2019  . Anxiety 05/05/2019  . Muscle spasms of neck 05/05/2019  . Acute renal failure superimposed on stage 3 chronic kidney disease ([place]) 05/04/2019  . Hormone  replacement therapy (HRT) 03/22/2014  . HYPERTENSION 12/07/2009  . Acute bronchitis 12/07/2009  . ALLERGIC RHINITIS 12/07/2009  . OSTEOARTHRITIS, MULTIPLE JOINTS 12/07/2009  . CAROTID BRUIT, LEFT 12/07/2009    Past Surgical History:  Procedure Laterality Date  . APPENDECTOMY    . BACK SURGERY  2013   lumb fusion  . COLONOSCOPY    . ERCP    . IR FLUORO GUIDE CV LINE RIGHT  08/20/2019  . IR US GUIDE VASC ACCESS RIGHT  08/20/2019  . ORIF TOE FRACTURE  02/27/2012   Procedure: OPEN REDUCTION INTERNAL FIXATION (ORIF) METATARSAL (TOE) FRACTURE;  Surgeon: Colin Rhein, MD;  Location: [facility];  Service: Orthopedics;  Laterality: Right;  RIGHT OPEN REDUCTION INTERNAL FIXATION NON UNION 1ST METATARSAL NECK,FLEXOR HALLUCIS LONGUS TO PROXIMAL PHALANAX TENDON TRANSFER,  GASTROC SLIDE,  LOCAL BONE GRAFT   . SHOULDER ARTHROSCOPY  2011   rt  . TENDON TRANSFER  02/27/2012   Procedure: TENDON TRANSFER;  Surgeon: Colin Rhein, MD;  Location: [facility];  Service: Orthopedics;  Laterality: Right;  . TOTAL ABDOMINAL HYSTERECTOMY    . TOTAL ABDOMINAL HYSTERECTOMY W/ BILATERAL SALPINGOOPHORECTOMY    . TOTAL HIP ARTHROPLASTY  03,09   rt and lt  . TOTAL HIP REVISION       OB History   No obstetric  history on file.     Family History  Problem Relation Age of Onset  . Asthma Mother   . Ovarian cancer Mother   . Pancreatic cancer Father     Social History   Tobacco Use  . Smoking status: Never  . Smokeless tobacco: Never  Substance Use Topics  . Alcohol use: No  . Drug use: No    Home Medications Prior to Admission medications   Medication Sig Start Date End Date Taking? Authorizing Provider  albuterol (PROAIR HFA) 108 (90 BASE) MCG/ACT inhaler 2 puffs every 4 hours if needed- rescue inhaler Patient taking differently: Inhale 2 puffs into the lungs every 4 (four) hours as needed for wheezing or shortness of breath. 11/01/11 12/08/21  Baird Lyons D, MD   budesonide-formoterol (SYMBICORT) 80-4.5 MCG/ACT inhaler Inhale 2 puffs into the lungs 2 (two) times daily. Rinse mouth Patient taking differently: Inhale 2 puffs into the lungs daily as needed (for shortness of breath). Rinse mouth 11/01/11 12/08/21  Baird Lyons D, MD  Cholecalciferol (VITAMIN D3) 1000 UNITS CAPS Take 1,000 Units by mouth daily.    [provider]  conjugated estrogens (PREMARIN) vaginal cream USE AS DIRECTED Patient not taking: Reported on 12/08/2020 05/05/15   Orlena Sheldon, PA-C  cromolyn (NASALCROM) 5.2 MG/ACT nasal spray Place 1 spray into the nose 4 (four) times daily as needed for allergies or rhinitis.     [provider]  diazepam (VALIUM) 10 MG tablet Take 1 tablet by mouth as needed for anxiety or sleep.  09/04/12   [provider]  estradiol (VIVELLE-DOT) 0.05 MG/24HR patch APPLY 1 PATCH EXTERNALLY TO THE SKIN EVERY 72 HOURS Patient taking differently: Place 1 patch onto the skin every 3 (three) days. 04/10/16   Dena Billet B, PA-C  fluocinonide cream (LIDEX) 3.41 % Apply 1 application topically daily as needed (itching). 04/23/19   [provider]  levothyroxine (SYNTHROID, LEVOTHROID) 100 MCG tablet Take 100 mcg by mouth every other day.     [provider]  MOVANTIK 25 MG TABS tablet Take 1 tablet (25 mg total) by mouth daily as needed (constipation). 05/08/19   Rai, Vernelle Emerald, MD  nystatin (MYCOSTATIN) powder Apply topically 2 (two) times daily. To effected areas Patient taking differently: Apply topically 2 (two) times daily as needed (skin irritation). To effected areas 12/01/14   Orlena Sheldon, PA-C  omeprazole (PRILOSEC) 40 MG capsule Take 40 mg by mouth daily.  01/21/14   [provider]  Oxycodone HCl 10 MG TABS Take 10 mg by mouth 2 (two) times daily. 12/02/20   [provider]  polyethylene glycol powder (MIRALAX) 17 GM/SCOOP powder Start taking 1 capful 3 times a day. Slowly cut back as needed until  you have normal bowel movements. 12/08/20   Cardama, Grayce Sessions, MD  Spacer/Aero-Holding Chambers (AEROCHAMBER MV) inhaler by Other route. Use as instructed     [provider]  SYNTHROID 112 MCG tablet Take 112 mcg by mouth every other day. 07/17/19   [provider]    Allergies    Linezolid, Sulfa antibiotics, Ampicillin, Cephalexin, Clarithromycin, Codeine, Doxycycline, Erythromycin, Gatifloxacin, Hydrocodone, Penicillins, Latex, and Tape  Review of Systems   Review of Systems  Constitutional:  Negative for fever.  Respiratory:  Negative for chest tightness and shortness of breath.   Cardiovascular:  Negative for chest pain.  Gastrointestinal:  Positive for rectal pain. Negative for abdominal pain, nausea and vomiting.  Skin:  Positive for wound.  All  other systems reviewed and are negative.  Physical Exam Updated Vital Signs BP 135/75 (BP Location: Right Arm)   Pulse 69   Temp 98.1 F (36.7 C) (Oral)   Resp 16   Ht (!) 0.127 m (5")   Wt 53.5 kg   SpO2 95%   BMI 3318.52 kg/m   Physical Exam Vitals and nursing note reviewed.  Constitutional:      Appearance: She is well-developed.     Comments: Chronically ill-appearing but nontoxic, very hard of hearing  HENT:     Head: Normocephalic and atraumatic.     Nose: Nose normal.     Mouth/Throat:     Mouth: Mucous membranes are moist.  Eyes:     Pupils: Pupils are equal, round, and reactive to light.  Cardiovascular:     Rate and Rhythm: Normal rate and regular rhythm.     Heart sounds: Normal heart sounds.  Pulmonary:     Effort: Pulmonary effort is normal. No respiratory distress.     Breath sounds: No wheezing.  Abdominal:     Palpations: Abdomen is soft.  Genitourinary:    Comments: Foley catheter in place Musculoskeletal:        General: No deformity.     Cervical back: Neck supple.     Comments: no obvious deformities, normal range of motion of the left knee and hip  Skin:    General:  Skin is warm and dry.     Comments: Large skin tear over the anterior aspect of the left distal lower extremity, no active bleeding, 2+ DP pulse,  Healing wounds of the right lower extremity Skin tear right elbow, 2+ radial pulse  Neurological:     Mental Status: She is alert and oriented to person, place, and time.  Psychiatric:        Mood and Affect: Mood normal.    ED Results / Procedures / Treatments   Labs (all labs ordered are listed, but only abnormal results are displayed) Labs Reviewed - No data to display  EKG None  Radiology DG Elbow Complete Right  Result Date: 12/10/2020 CLINICAL DATA:  87 year old female with history of trauma from a fall complaining of left elbow pain. EXAM: RIGHT ELBOW - COMPLETE 3+ VIEW COMPARISON:  No priors. FINDINGS: There is no evidence of fracture, dislocation, or joint effusion. There is no evidence of arthropathy or other focal bone abnormality. Soft tissues are unremarkable. IMPRESSION: Negative. Electronically Signed   By: Vinnie Langton M.D.   On: 12/10/2020 05:19   DG Tibia/Fibula Left  Result Date: 12/10/2020 CLINICAL DATA:  87 year old female with history of trauma from a fall. Left leg pain. EXAM: LEFT TIBIA AND FIBULA - 2 VIEW COMPARISON:  No priors. FINDINGS: There is no evidence of fracture or other focal bone lesions. Soft tissues are unremarkable. IMPRESSION: Negative. Electronically Signed   By: Vinnie Langton M.D.   On: 12/10/2020 05:18    Procedures Procedures   Medications Ordered in ED Medications  Tdap (BOOSTRIX) injection 0.5 mL (has no administration in time range)    ED Course  I have reviewed the triage vital signs and the nursing notes.  Pertinent labs & imaging results that were available during my care of the patient were reviewed by me and considered in my medical decision making (see chart for details).  Clinical Course as of 12/10/20 1517  Sat Dec 10, 2020  0613 At time of discharge, daughter is now  at the bedside.  She has additional  concerns.  She states that since the patient was seen on Tuesday night she has had ongoing issues with constipation.  She relates this to her urinary retention.  They have started MiraLAX but she states that she is having diffuse watery stools and leakage but no sizable bowel movements.  I did a rectal exam at bedside.  She does have a impaction although the stool in her rectum was relatively soft.  I was able to disimpact manually to some extent.  We will give an enema.  Her urinary retention is likely a result.  She also has evidence of skin breakdown. [CH]    Clinical Course User Index [CH] Adanna Zuckerman, Barbette Hair, MD   MDM Rules/Calculators/A&P                           Patient presents following a fall.  Reports mechanical fall.  She is nontoxic.  She denies hitting her head or loss of conscious.  She is not on any blood thinners.  She has evidence of a skin tear to the left leg into the right arm.  X-rays obtained.  Tetanus updated.  X-rays do not show any signs of acute fracture.  Wounds were cleaned and dressed.  Patient lives alone.  She states she has recently started walking with a walker.  Will ambulate in the hall to ensure that she is safe for discharge home.  She sees wound care for her right lower extremity.  Recommend that she follow-up with wound care regarding her left lower extremity.  After history, exam, and medical workup I feel the patient has been appropriately medically screened and is safe for discharge home. Pertinent diagnoses were discussed with the patient. Patient was given return precautions.  Final Clinical Impression(s) / ED Diagnoses Final diagnoses:  Fall, initial encounter  Skin tear of right elbow without complication, initial encounter  Skin tear of left lower leg without complication, initial encounter    Rx / DC Orders ED Discharge Orders     None        Taeshaun Rames, Barbette Hair, MD 12/10/20 [phone]

## 2020-12-10 NOTE — Discharge Instructions (Addendum)
You were seen today after a fall.  Your x-rays are negative for fracture.  Regarding your skin tears, keep dressed with a nonadherent dressing.  Follow-up with your wound management that you see for your right leg as they can help manage the left leg as well.

## 2020-12-10 NOTE — ED Notes (Signed)
No success with enema. Dr. Pearline Cables aware

## 2020-12-10 NOTE — ED Triage Notes (Signed)
Pt BIB EMS from home - pt fell getting out of bed - not on thinners, no LOC. Pt has skin tear to left leg and right arm - wrapped by EMS.  EMS reports hx of chronic back pain and pt reports not any worse after fall.  20LAC - 164mcg of fentanyl given by EMS  Pt reports 9/10 pain  Pt AO

## 2020-12-10 NOTE — ED Notes (Signed)
Pts skin tears cleansed, bacitracin applied and/or petroleum gauze and wrapped

## 2020-12-10 NOTE — ED Notes (Signed)
Pt refusing to do enema until pt get pain meds, this RN got order for pain meds and gave to pt, pt now refusing for enema to be done asking to wait a few minutes

## 2021-02-02 ENCOUNTER — Inpatient Hospital Stay (HOSPITAL_COMMUNITY)
Admission: EM | Admit: 2021-02-02 | Discharge: 2021-02-09 | DRG: 871 | Disposition: A | Discharge status: Skilled Nursing Facility | Payer: Medicare Other | Attending: Internal Medicine | Admitting: Internal Medicine

## 2021-02-02 ENCOUNTER — Other Ambulatory Visit: Payer: Self-pay

## 2021-02-02 ENCOUNTER — Emergency Department (HOSPITAL_COMMUNITY): Payer: Medicare Other

## 2021-02-02 ENCOUNTER — Observation Stay (HOSPITAL_COMMUNITY): Payer: Medicare Other

## 2021-02-02 ENCOUNTER — Encounter (HOSPITAL_COMMUNITY): Payer: Self-pay

## 2021-02-02 DIAGNOSIS — Z91048 Other nonmedicinal substance allergy status: Secondary | ICD-10-CM

## 2021-02-02 DIAGNOSIS — E872 Acidosis, unspecified: Secondary | ICD-10-CM | POA: Diagnosis present

## 2021-02-02 DIAGNOSIS — G9341 Metabolic encephalopathy: Secondary | ICD-10-CM | POA: Diagnosis present

## 2021-02-02 DIAGNOSIS — Z981 Arthrodesis status: Secondary | ICD-10-CM

## 2021-02-02 DIAGNOSIS — Z96643 Presence of artificial hip joint, bilateral: Secondary | ICD-10-CM | POA: Diagnosis present

## 2021-02-02 DIAGNOSIS — I129 Hypertensive chronic kidney disease with stage 1 through stage 4 chronic kidney disease, or unspecified chronic kidney disease: Secondary | ICD-10-CM | POA: Diagnosis present

## 2021-02-02 DIAGNOSIS — R6520 Severe sepsis without septic shock: Secondary | ICD-10-CM | POA: Diagnosis present

## 2021-02-02 DIAGNOSIS — G8929 Other chronic pain: Secondary | ICD-10-CM | POA: Diagnosis present

## 2021-02-02 DIAGNOSIS — E039 Hypothyroidism, unspecified: Secondary | ICD-10-CM | POA: Diagnosis present

## 2021-02-02 DIAGNOSIS — Z880 Allergy status to penicillin: Secondary | ICD-10-CM

## 2021-02-02 DIAGNOSIS — E876 Hypokalemia: Secondary | ICD-10-CM | POA: Diagnosis present

## 2021-02-02 DIAGNOSIS — K219 Gastro-esophageal reflux disease without esophagitis: Secondary | ICD-10-CM | POA: Diagnosis present

## 2021-02-02 DIAGNOSIS — L03115 Cellulitis of right lower limb: Secondary | ICD-10-CM | POA: Diagnosis present

## 2021-02-02 DIAGNOSIS — R338 Other retention of urine: Secondary | ICD-10-CM | POA: Diagnosis not present

## 2021-02-02 DIAGNOSIS — T148XXA Other injury of unspecified body region, initial encounter: Secondary | ICD-10-CM | POA: Diagnosis present

## 2021-02-02 DIAGNOSIS — K5900 Constipation, unspecified: Secondary | ICD-10-CM | POA: Diagnosis present

## 2021-02-02 DIAGNOSIS — Z882 Allergy status to sulfonamides status: Secondary | ICD-10-CM

## 2021-02-02 DIAGNOSIS — S81812A Laceration without foreign body, left lower leg, initial encounter: Secondary | ICD-10-CM | POA: Diagnosis present

## 2021-02-02 DIAGNOSIS — E871 Hypo-osmolality and hyponatremia: Secondary | ICD-10-CM | POA: Diagnosis not present

## 2021-02-02 DIAGNOSIS — J45998 Other asthma: Secondary | ICD-10-CM | POA: Diagnosis present

## 2021-02-02 DIAGNOSIS — M1990 Unspecified osteoarthritis, unspecified site: Secondary | ICD-10-CM | POA: Diagnosis present

## 2021-02-02 DIAGNOSIS — L0390 Cellulitis, unspecified: Secondary | ICD-10-CM | POA: Diagnosis present

## 2021-02-02 DIAGNOSIS — Z79899 Other long term (current) drug therapy: Secondary | ICD-10-CM

## 2021-02-02 DIAGNOSIS — S81811A Laceration without foreign body, right lower leg, initial encounter: Secondary | ICD-10-CM | POA: Diagnosis present

## 2021-02-02 DIAGNOSIS — J309 Allergic rhinitis, unspecified: Secondary | ICD-10-CM | POA: Diagnosis present

## 2021-02-02 DIAGNOSIS — M797 Fibromyalgia: Secondary | ICD-10-CM | POA: Diagnosis present

## 2021-02-02 DIAGNOSIS — K649 Unspecified hemorrhoids: Secondary | ICD-10-CM | POA: Diagnosis present

## 2021-02-02 DIAGNOSIS — J45909 Unspecified asthma, uncomplicated: Secondary | ICD-10-CM | POA: Diagnosis present

## 2021-02-02 DIAGNOSIS — Z885 Allergy status to narcotic agent status: Secondary | ICD-10-CM

## 2021-02-02 DIAGNOSIS — Z881 Allergy status to other antibiotic agents status: Secondary | ICD-10-CM

## 2021-02-02 DIAGNOSIS — Z888 Allergy status to other drugs, medicaments and biological substances status: Secondary | ICD-10-CM

## 2021-02-02 DIAGNOSIS — S41112A Laceration without foreign body of left upper arm, initial encounter: Secondary | ICD-10-CM | POA: Diagnosis present

## 2021-02-02 DIAGNOSIS — E860 Dehydration: Secondary | ICD-10-CM | POA: Diagnosis present

## 2021-02-02 DIAGNOSIS — Z20822 Contact with and (suspected) exposure to covid-19: Secondary | ICD-10-CM | POA: Diagnosis present

## 2021-02-02 DIAGNOSIS — A419 Sepsis, unspecified organism: Principal | ICD-10-CM | POA: Diagnosis present

## 2021-02-02 DIAGNOSIS — K5909 Other constipation: Secondary | ICD-10-CM | POA: Diagnosis present

## 2021-02-02 DIAGNOSIS — Z91040 Latex allergy status: Secondary | ICD-10-CM

## 2021-02-02 DIAGNOSIS — L03116 Cellulitis of left lower limb: Secondary | ICD-10-CM

## 2021-02-02 DIAGNOSIS — I10 Essential (primary) hypertension: Secondary | ICD-10-CM | POA: Diagnosis present

## 2021-02-02 DIAGNOSIS — X58XXXA Exposure to other specified factors, initial encounter: Secondary | ICD-10-CM | POA: Diagnosis present

## 2021-02-02 DIAGNOSIS — R296 Repeated falls: Secondary | ICD-10-CM | POA: Diagnosis present

## 2021-02-02 DIAGNOSIS — N1831 Chronic kidney disease, stage 3a: Secondary | ICD-10-CM | POA: Diagnosis present

## 2021-02-02 DIAGNOSIS — Z79890 Hormone replacement therapy (postmenopausal): Secondary | ICD-10-CM

## 2021-02-02 DIAGNOSIS — Z7951 Long term (current) use of inhaled steroids: Secondary | ICD-10-CM

## 2021-02-02 DIAGNOSIS — R339 Retention of urine, unspecified: Secondary | ICD-10-CM | POA: Diagnosis not present

## 2021-02-02 DIAGNOSIS — F0390 Unspecified dementia without behavioral disturbance: Secondary | ICD-10-CM | POA: Diagnosis present

## 2021-02-02 DIAGNOSIS — H9190 Unspecified hearing loss, unspecified ear: Secondary | ICD-10-CM | POA: Diagnosis present

## 2021-02-02 DIAGNOSIS — E538 Deficiency of other specified B group vitamins: Secondary | ICD-10-CM | POA: Diagnosis present

## 2021-02-02 LAB — BASIC METABOLIC PANEL
Anion gap: 6 (ref 5–15)
BUN: 24 mg/dL — ABNORMAL HIGH (ref 8–23)
CO2: 24 mmol/L (ref 22–32)
Calcium: 9.1 mg/dL (ref 8.9–10.3)
Chloride: 97 mmol/L — ABNORMAL LOW (ref 98–111)
Creatinine, Ser: 1.04 mg/dL — ABNORMAL HIGH (ref 0.44–1.00)
GFR, Estimated: 52 mL/min — ABNORMAL LOW (ref >60)
Glucose, Bld: 109 mg/dL — ABNORMAL HIGH (ref 70–99)
Potassium: 3.7 mmol/L (ref 3.5–5.1)
Sodium: 127 mmol/L — ABNORMAL LOW (ref 135–145)

## 2021-02-02 LAB — CBC WITH DIFFERENTIAL/PLATELET
Abs Immature Granulocytes: 0.06 10*3/uL (ref 0.00–0.07)
Basophils Absolute: 0 10*3/uL (ref 0.0–0.1)
Basophils Relative: 0 %
Eosinophils Absolute: 0.1 10*3/uL (ref 0.0–0.5)
Eosinophils Relative: 1 %
HCT: 41.4 % (ref 36.0–46.0)
Hemoglobin: 13.3 g/dL (ref 12.0–15.0)
Immature Granulocytes: 1 %
Lymphocytes Relative: 12 %
Lymphs Abs: 1.5 10*3/uL (ref 0.7–4.0)
MCH: 28.8 pg (ref 26.0–34.0)
MCHC: 32.1 g/dL (ref 30.0–36.0)
MCV: 89.6 fL (ref 80.0–100.0)
Monocytes Absolute: 1.2 10*3/uL — ABNORMAL HIGH (ref 0.1–1.0)
Monocytes Relative: 9 %
Neutro Abs: 9.9 10*3/uL — ABNORMAL HIGH (ref 1.7–7.7)
Neutrophils Relative %: 77 %
Platelets: 234 10*3/uL (ref 150–400)
RBC: 4.62 MIL/uL (ref 3.87–5.11)
RDW: 14.7 % (ref 11.5–15.5)
WBC: 12.9 10*3/uL — ABNORMAL HIGH (ref 4.0–10.5)
nRBC: 0 % (ref 0.0–0.2)

## 2021-02-02 LAB — RESP PANEL BY RT-PCR (FLU A&B, COVID) ARPGX2
Influenza A by PCR: NEGATIVE
Influenza B by PCR: NEGATIVE
SARS Coronavirus 2 by RT PCR: NEGATIVE

## 2021-02-02 LAB — COMPREHENSIVE METABOLIC PANEL
ALT: 10 U/L (ref 0–44)
AST: 14 U/L — ABNORMAL LOW (ref 15–41)
Albumin: 4.1 g/dL (ref 3.5–5.0)
Alkaline Phosphatase: 92 U/L (ref 38–126)
Anion gap: 6 (ref 5–15)
BUN: 23 mg/dL (ref 8–23)
CO2: 26 mmol/L (ref 22–32)
Calcium: 9.7 mg/dL (ref 8.9–10.3)
Chloride: 94 mmol/L — ABNORMAL LOW (ref 98–111)
Creatinine, Ser: 1.12 mg/dL — ABNORMAL HIGH (ref 0.44–1.00)
GFR, Estimated: 48 mL/min — ABNORMAL LOW (ref >60)
Glucose, Bld: 107 mg/dL — ABNORMAL HIGH (ref 70–99)
Potassium: 4.1 mmol/L (ref 3.5–5.1)
Sodium: 126 mmol/L — ABNORMAL LOW (ref 135–145)
Total Bilirubin: 0.7 mg/dL (ref 0.3–1.2)
Total Protein: 8.1 g/dL (ref 6.5–8.1)

## 2021-02-02 LAB — LACTIC ACID, PLASMA
Lactic Acid, Venous: 1 mmol/L (ref 0.5–1.9)
Lactic Acid, Venous: 3.9 mmol/L (ref 0.5–1.9)

## 2021-02-02 LAB — LIPASE, BLOOD: Lipase: 34 U/L (ref 11–51)

## 2021-02-02 LAB — AMMONIA: Ammonia: 24 umol/L (ref 9–35)

## 2021-02-02 MED ORDER — MOMETASONE FURO-FORMOTEROL FUM 100-5 MCG/ACT IN AERO
2.0000 | INHALATION_SPRAY | Freq: Two times a day (BID) | RESPIRATORY_TRACT | Status: DC
  Administered 2021-02-03 – 2021-02-09 (×13): 2 via RESPIRATORY_TRACT
  Filled 2021-02-02: qty 8.8

## 2021-02-02 MED ORDER — LEVOTHYROXINE SODIUM 100 MCG PO TABS
100.0000 ug | ORAL_TABLET | ORAL | Status: DC
  Administered 2021-02-03 – 2021-02-09 (×4): 100 ug via ORAL
  Filled 2021-02-02 (×4): qty 1

## 2021-02-02 MED ORDER — DOCUSATE SODIUM 100 MG PO CAPS
100.0000 mg | ORAL_CAPSULE | Freq: Every day | ORAL | Status: DC
  Administered 2021-02-03 – 2021-02-09 (×6): 100 mg via ORAL
  Filled 2021-02-02 (×6): qty 1

## 2021-02-02 MED ORDER — ALBUTEROL SULFATE (2.5 MG/3ML) 0.083% IN NEBU
2.5000 mg | INHALATION_SOLUTION | RESPIRATORY_TRACT | Status: DC | PRN
  Administered 2021-02-07: 2.5 mg via RESPIRATORY_TRACT
  Filled 2021-02-02 (×2): qty 3

## 2021-02-02 MED ORDER — SODIUM CHLORIDE 0.9 % IV BOLUS
500.0000 mL | Freq: Once | INTRAVENOUS | Status: AC
  Administered 2021-02-02: 500 mL via INTRAVENOUS

## 2021-02-02 MED ORDER — ACETAMINOPHEN 650 MG RE SUPP: 650.0000 mg | Freq: Four times a day (QID) | RECTAL | Status: DC | PRN

## 2021-02-02 MED ORDER — POLYETHYLENE GLYCOL 3350 17 G PO PACK
17.0000 g | PACK | Freq: Every day | ORAL | Status: DC
  Administered 2021-02-05 – 2021-02-09 (×4): 17 g via ORAL
  Filled 2021-02-02 (×5): qty 1

## 2021-02-02 MED ORDER — ENOXAPARIN SODIUM 40 MG/0.4ML IJ SOSY
40.0000 mg | PREFILLED_SYRINGE | INTRAMUSCULAR | Status: DC
  Administered 2021-02-02: 40 mg via SUBCUTANEOUS
  Filled 2021-02-02: qty 0.4

## 2021-02-02 MED ORDER — LORATADINE 10 MG PO TABS
10.0000 mg | ORAL_TABLET | Freq: Every day | ORAL | Status: DC
  Administered 2021-02-03 – 2021-02-09 (×7): 10 mg via ORAL
  Filled 2021-02-02 (×7): qty 1

## 2021-02-02 MED ORDER — FLEET ENEMA 7-19 GM/118ML RE ENEM
1.0000 | ENEMA | Freq: Once | RECTAL | Status: AC
  Administered 2021-02-02: 1 via RECTAL
  Filled 2021-02-02: qty 1

## 2021-02-02 MED ORDER — VANCOMYCIN HCL IN DEXTROSE 1-5 GM/200ML-% IV SOLN
1000.0000 mg | Freq: Once | INTRAVENOUS | Status: AC
  Administered 2021-02-02: 1000 mg via INTRAVENOUS
  Filled 2021-02-02: qty 200

## 2021-02-02 MED ORDER — SODIUM CHLORIDE 0.9 % IV SOLN: INTRAVENOUS | Status: AC

## 2021-02-02 MED ORDER — ACETAMINOPHEN 325 MG PO TABS
650.0000 mg | ORAL_TABLET | Freq: Four times a day (QID) | ORAL | Status: DC | PRN
  Administered 2021-02-03 – 2021-02-06 (×2): 650 mg via ORAL
  Filled 2021-02-02 (×5): qty 2

## 2021-02-02 MED ORDER — SENNA 8.6 MG PO TABS
1.0000 | ORAL_TABLET | Freq: Every day | ORAL | Status: DC
  Administered 2021-02-03 – 2021-02-09 (×6): 8.6 mg via ORAL
  Filled 2021-02-02 (×6): qty 1

## 2021-02-02 MED ORDER — VANCOMYCIN HCL 750 MG/150ML IV SOLN
750.0000 mg | Freq: Two times a day (BID) | INTRAVENOUS | Status: DC
  Administered 2021-02-04: 750 mg via INTRAVENOUS
  Filled 2021-02-02: qty 150

## 2021-02-02 MED ORDER — ENOXAPARIN SODIUM 30 MG/0.3ML IJ SOSY
30.0000 mg | PREFILLED_SYRINGE | INTRAMUSCULAR | Status: DC
  Administered 2021-02-03 – 2021-02-09 (×6): 30 mg via SUBCUTANEOUS
  Filled 2021-02-02 (×6): qty 0.3

## 2021-02-02 MED ORDER — LEVOTHYROXINE SODIUM 112 MCG PO TABS
112.0000 ug | ORAL_TABLET | ORAL | Status: DC
  Administered 2021-02-04 – 2021-02-08 (×3): 112 ug via ORAL
  Filled 2021-02-02 (×3): qty 1

## 2021-02-02 MED ORDER — PANTOPRAZOLE SODIUM 40 MG PO TBEC
40.0000 mg | DELAYED_RELEASE_TABLET | Freq: Every day | ORAL | Status: DC
  Administered 2021-02-03 – 2021-02-09 (×7): 40 mg via ORAL
  Filled 2021-02-02 (×7): qty 1

## 2021-02-02 NOTE — ED Provider Notes (Addendum)
[facility] DEPT Provider Note   CSN: [id number] Arrival date & time: 02/02/21  1709     History  Chief Complaint  Patient presents with   Constipation    Penny Perez is a 88 y.o. female.  88 year old female with prior medical history as detailed below presents for evaluation.  She arrives from home.  EMS transported her at request of her daughter.  Patient with history of constipation in the past.  Patient with significant symptoms of constipation for the last week per EMS.  She has some stool leaking out around a possible obstructive stool in the rectum.  Patient with known history of dementia.  She is also very hard of hearing.  Her mental status appears to be at baseline per EMS report.  EMS reports that her daughter is in route to the hospital.  Patient denies any specific complaint on my evaluation.  She appears comfortable.  Additional history obtained from the patient's daughter at bedside.  Patient lives independently.  The patient's daughter visits every day.  Over the last 2 to 3 days the patient has become weaker and more confused.  Patient with multiple skin tears from multiple frequent falls.   Patient with longstanding history of constipation per the daughter.  Patient with increased symptoms of stool impaction of noted over the last week.  Patient with multiple skin tears from multiple recent falls.  Patient with new redness noted along the anterior aspect of the left lower leg.  Daughter reports that this erythema developed over the last 24 hours.  The history is provided by the patient, medical records and the EMS personnel.      Home Medications Prior to Admission medications   Medication Sig Start Date End Date Taking? Authorizing Provider  albuterol (PROAIR HFA) 108 (90 BASE) MCG/ACT inhaler 2 puffs every 4 hours if needed- rescue inhaler Patient taking differently: Inhale 2 puffs into the lungs every 4 (four) hours as  needed for wheezing or shortness of breath. 11/01/11 12/08/21  Baird Lyons D, MD  budesonide-formoterol (SYMBICORT) 80-4.5 MCG/ACT inhaler Inhale 2 puffs into the lungs 2 (two) times daily. Rinse mouth Patient taking differently: Inhale 2 puffs into the lungs daily as needed (for shortness of breath). Rinse mouth 11/01/11 12/08/21  Baird Lyons D, MD  Cholecalciferol (VITAMIN D3) 1000 UNITS CAPS Take 1,000 Units by mouth daily.    [provider]  conjugated estrogens (PREMARIN) vaginal cream USE AS DIRECTED Patient not taking: Reported on 12/08/2020 05/05/15   Orlena Sheldon, PA-C  cromolyn (NASALCROM) 5.2 MG/ACT nasal spray Place 1 spray into the nose 4 (four) times daily as needed for allergies or rhinitis.     [provider]  diazepam (VALIUM) 10 MG tablet Take 1 tablet by mouth as needed for anxiety or sleep.  09/04/12   [provider]  estradiol (VIVELLE-DOT) 0.05 MG/24HR patch APPLY 1 PATCH EXTERNALLY TO THE SKIN EVERY 72 HOURS Patient taking differently: Place 1 patch onto the skin every 3 (three) days. 04/10/16   Dena Billet B, PA-C  fluocinonide cream (LIDEX) 3.71 % Apply 1 application topically daily as needed (itching). 04/23/19   [provider]  levothyroxine (SYNTHROID, LEVOTHROID) 100 MCG tablet Take 100 mcg by mouth every other day.     [provider]  MOVANTIK 25 MG TABS tablet Take 1 tablet (25 mg total) by mouth daily as needed (constipation). 05/08/19   Rai, Vernelle Emerald, MD  nystatin (MYCOSTATIN) powder Apply topically 2 (two)  times daily. To effected areas Patient taking differently: Apply topically 2 (two) times daily as needed (skin irritation). To effected areas 12/01/14   Orlena Sheldon, PA-C  omeprazole (PRILOSEC) 40 MG capsule Take 40 mg by mouth daily.  01/21/14   [provider]  Oxycodone HCl 10 MG TABS Take 10 mg by mouth 2 (two) times daily. 12/02/20   [provider]  polyethylene glycol powder (MIRALAX) 17  GM/SCOOP powder Start taking 1 capful 3 times a day. Slowly cut back as needed until you have normal bowel movements. 12/08/20   Cardama, Grayce Sessions, MD  Spacer/Aero-Holding Chambers (AEROCHAMBER MV) inhaler by Other route. Use as instructed     [provider]  SYNTHROID 112 MCG tablet Take 112 mcg by mouth every other day. 07/17/19   [provider]      Allergies    Linezolid, Sulfa antibiotics, Ampicillin, Cephalexin, Clarithromycin, Codeine, Doxycycline, Erythromycin, Gatifloxacin, Hydrocodone, Penicillins, Latex, and Tape    Review of Systems   Review of Systems  All other systems reviewed and are negative.  Physical Exam Updated Vital Signs BP (!) 189/89 (BP Location: Left Arm)    Pulse 75    Temp 97.6 F (36.4 C) (Oral)    Resp 18    SpO2 98%  Physical Exam Vitals and nursing note reviewed.  Constitutional:      General: She is not in acute distress.    Appearance: Normal appearance. She is well-developed.  HENT:     Head: Normocephalic and atraumatic.  Eyes:     Conjunctiva/sclera: Conjunctivae normal.     Pupils: Pupils are equal, round, and reactive to light.  Cardiovascular:     Rate and Rhythm: Regular rhythm.     Heart sounds: Normal heart sounds.  Pulmonary:     Effort: Pulmonary effort is normal. No respiratory distress.     Breath sounds: Normal breath sounds.  Abdominal:     General: There is no distension.     Palpations: Abdomen is soft.     Tenderness: There is no abdominal tenderness.  Genitourinary:    Comments: Nursing chaperone present during rectal exam.  Soft stool felt in the rectal vault.  The stool was broken up by finger.  Patient tolerated this poorly.  Minimal volume of stool was able to be removed secondary to patient's poor toleration. Musculoskeletal:        General: No deformity. Normal range of motion.     Cervical back: Normal range of motion and neck supple.  Skin:    General: Skin is warm and dry.      Comments: Multiple skin tears noted to both upper and lower extremities  Area of distinct erythema from the mid shin on the left lower leg extending to the dorsal aspect of the left foot.  Neurological:     General: No focal deficit present.     Mental Status: She is alert. Mental status is at baseline.    ED Results / Procedures / Treatments   Labs (all labs ordered are listed, but only abnormal results are displayed) Labs Reviewed  COMPREHENSIVE METABOLIC PANEL - Abnormal; Notable for the following components:      Result Value   Sodium 126 (*)    Chloride 94 (*)    Glucose, Bld 107 (*)    Creatinine, Ser 1.12 (*)    AST 14 (*)    GFR, Estimated 48 (*)    All other components within normal limits  CBC WITH  DIFFERENTIAL/PLATELET - Abnormal; Notable for the following components:   WBC 12.9 (*)    Neutro Abs 9.9 (*)    Monocytes Absolute 1.2 (*)    All other components within normal limits  CULTURE, BLOOD (ROUTINE X 2)  CULTURE, BLOOD (ROUTINE X 2)  RESP PANEL BY RT-PCR (FLU A&B, COVID) ARPGX2  LIPASE, BLOOD  LACTIC ACID, PLASMA  LACTIC ACID, PLASMA  URINALYSIS, ROUTINE W REFLEX MICROSCOPIC    EKG None  Radiology CT ABDOMEN PELVIS WO CONTRAST  Result Date: 02/02/2021 CLINICAL DATA:  Abdominal pain, acute, nonlocalized EXAM: CT ABDOMEN AND PELVIS WITHOUT CONTRAST TECHNIQUE: Multidetector CT imaging of the abdomen and pelvis was performed following the standard protocol without IV contrast. COMPARISON:  None. FINDINGS: Lower chest: Bilateral lower lobe scarring versus atelectasis. Bronchial wall thickening. Otherwise no acute abnormality. Hepatobiliary: No focal liver abnormality. No gallstones, gallbladder wall thickening, or pericholecystic fluid. No biliary dilatation. Pancreas: Diffusely atrophic. No focal lesion. Otherwise normal pancreatic contour. No surrounding inflammatory changes. No main pancreatic ductal dilatation. Spleen: Normal in size without focal abnormality.  Adrenals/Urinary Tract: The adrenal glands are not well visualized.  No definite nodularity. No nephrolithiasis and no hydronephrosis. No definite contour-deforming renal mass. No ureterolithiasis or hydroureter. The urinary bladder is unremarkable. Stomach/Bowel: Stomach is within normal limits. No evidence of small bowel wall thickening or dilatation. Stool noted throughout the ascending colon and mid transverse colon. 8 cm rectal stool ball. Mild perirectal and proximal colon fat stranding. Possible mild rectosigmoid bowel wall thickening just proximal to the rectal stool ball. Status post appendectomy. Vascular/Lymphatic: No abdominal aorta or iliac aneurysm. Severe atherosclerotic plaque of the aorta and its branches. No abdominal, pelvic, or inguinal lymphadenopathy. Reproductive: Status post hysterectomy. No adnexal masses. Other: No intraperitoneal free fluid. No intraperitoneal free gas. No organized fluid collection. Musculoskeletal: No abdominal wall hernia or abnormality. No suspicious lytic or blastic osseous lesions. No acute displaced fracture. Thoracolumbar levoscoliosis. L4-L5 posterolateral and interbody fusion. Bilateral hip degenerative changes. IMPRESSION: 1. An 8 cm rectal stool ball with query developing cervical or colitis. 2.  Aortic Atherosclerosis (ICD10-I70.0). 3. Please note markedly limited evaluation on this noncontrast study. Electronically Signed   By: Iven Finn M.D.   On: 02/02/2021 18:23    Procedures Procedures    Medications Ordered in ED Medications - No data to display  ED Course/ Medical Decision Making/ A&P                           Medical Decision Making Patient presents by EMS for likely worsening constipation.  Patient's daughter is also concerned about possible weakness.    Patient with also likely left leg cellulitis that is acute for the daughter.  Problems Addressed: Constipation, unspecified constipation type: acute illness or  injury Hyponatremia: acute illness or injury Left leg cellulitis: acute illness or injury  Amount and/or Complexity of Data Reviewed Independent Historian: caregiver and EMS    Details: Hx of constipation. Now with worsening symptoms.  Patient with left leg redness and concern for infection. External Data Reviewed: labs, radiology, ECG and notes.    Details: prior ED evaluations / Prior inpatient evaluations Labs: ordered. Decision-making details documented in ED Course.    Details: CBC, CMP, lipase, UA  CT abdomen pelvis  CT head Radiology: ordered and independent interpretation performed.    Details: CT abdomen pelvis - NAD CT head- NAD  Agree with radiology interpretation. Discussion of management or test interpretation with external  provider(s): Hospitalist service is aware of case and will evaluate for admission.  Risk Decision regarding hospitalization.           Final Clinical Impression(s) / ED Diagnoses Final diagnoses:  Constipation, unspecified constipation type  Hyponatremia  Left leg cellulitis    Rx / DC Orders ED Discharge Orders     None         Valarie Merino, MD 02/02/21 2009    Valarie Merino, MD 02/02/21 2032

## 2021-02-02 NOTE — Progress Notes (Signed)
A consult was received from an ED physician for vancomycin per pharmacy dosing (for an indication other than meningitis). The patient's profile has been reviewed for ht/wt/allergies/indication/available labs. A one time order has been placed for the above antibiotics.  Further antibiotics/pharmacy consults should be ordered by admitting physician if indicated.                       Reuel Boom, PharmD, BCPS [phone] 02/02/2021, 8:01 PM

## 2021-02-02 NOTE — H&P (Signed)
History and Physical    Penny Perez MVH:[id number] DOB: [birth year] DOA: 02/02/2021  PCP: Chesley Noon, MD Patient coming from: Home  Chief Complaint: Constipation  HPI: Penny Perez is a 88 y.o. female with medical history significant of fibromyalgia, cervical degenerative disc disease, osteoarthritis, scoliosis, chronic pain on opiate, chronic constipation, CKD stage IIIa, chronic hyponatremia, asthma, allergic rhinitis, hypertension, hypothyroidism, GERD, dementia, hard of hearing presenting to the ED via EMS for evaluation of constipation, confusion, and multiple falls.    Hypertensive on arrival, remainder of vital signs stable.  Labs notable for WBC 12.9.  Sodium 126.  No elevation of lipase or LFTs.  Creatinine 1.1, at baseline.  Blood culture x2 pending.  Lactic acid pending.  COVID and influenza PCR pending.  UA pending.  CT head pending.  CT abdomen pelvis showing stool throughout the ascending colon and mid transverse colon.  8 cm rectal stool ball.  Mild perirectal and proximal colon fat stranding.  Possible mild rectosigmoid bowel wall thickening just proximal to the rectal stool ball.  Patient noted to have multiple skin tears in the setting of recent falls and an area of erythema on her left shin concerning for cellulitis.  She was started on vancomycin.  She was given a 500 cc normal saline bolus and Fleet enema.  She could not tolerate manual fecal disimpaction due to discomfort from hemorrhoids.  Patient is currently somnolent and not able to give any history. No family at bedside.   Review of Systems:  All systems reviewed and apart from history of presenting illness, are negative.  Past Medical History:  Diagnosis Date   ALLERGIC RHINITIS    Asthma    Bronchitis    DDD (degenerative disc disease), cervical    Esophageal reflux    Fibromyalgia    Osteoarthritis    Scoliosis    Unspecified essential hypertension     Past Surgical History:  Procedure  Laterality Date   APPENDECTOMY     BACK SURGERY  2013   lumb fusion   COLONOSCOPY     ERCP     IR FLUORO GUIDE CV LINE RIGHT  08/20/2019   IR US GUIDE VASC ACCESS RIGHT  08/20/2019   ORIF TOE FRACTURE  02/27/2012   Procedure: OPEN REDUCTION INTERNAL FIXATION (ORIF) METATARSAL (TOE) FRACTURE;  Surgeon: Colin Rhein, MD;  Location: [facility];  Service: Orthopedics;  Laterality: Right;  RIGHT OPEN REDUCTION INTERNAL FIXATION NON UNION 1ST METATARSAL NECK,FLEXOR HALLUCIS LONGUS TO PROXIMAL PHALANAX TENDON TRANSFER,  GASTROC SLIDE,  LOCAL BONE GRAFT    SHOULDER ARTHROSCOPY  2011   rt   TENDON TRANSFER  02/27/2012   Procedure: TENDON TRANSFER;  Surgeon: Colin Rhein, MD;  Location: [place];  Service: Orthopedics;  Laterality: Right;   TOTAL ABDOMINAL HYSTERECTOMY     TOTAL ABDOMINAL HYSTERECTOMY W/ BILATERAL SALPINGOOPHORECTOMY     TOTAL HIP ARTHROPLASTY  03,09   rt and lt   TOTAL HIP REVISION       reports that she has never smoked. She has never used smokeless tobacco. She reports that she does not drink alcohol and does not use drugs.  Allergies  Allergen Reactions   Linezolid Nausea And Vomiting   Sulfa Antibiotics     Other reaction(s): Other Abdominal pain and bloating Abdominal pain and bloating   Ampicillin Other (See Comments)    Ulcers in mouth,stomach upset   Cephalexin Other (See Comments)    Mouth ulcer   Clarithromycin  Other (See Comments)    Mouth ulcer   Codeine Other (See Comments)    unknown   Doxycycline Other (See Comments)    Mouth ulcer   Erythromycin Other (See Comments)    Mouth ulcer   Gatifloxacin Other (See Comments)    Mouth ulcer   Hydrocodone Other (See Comments)    unknown   Penicillins Other (See Comments)    Mouth ulcer   Latex Rash   Tape Rash    Family History  Problem Relation Age of Onset   Asthma Mother    Ovarian cancer Mother    Pancreatic cancer Father     Prior to Admission medications    Medication Sig Start Date End Date Taking? Authorizing Provider  albuterol (PROAIR HFA) 108 (90 BASE) MCG/ACT inhaler 2 puffs every 4 hours if needed- rescue inhaler Patient taking differently: Inhale 2 puffs into the lungs every 4 (four) hours as needed for wheezing or shortness of breath. 11/01/11 12/08/21  Baird Lyons D, MD  budesonide-formoterol (SYMBICORT) 80-4.5 MCG/ACT inhaler Inhale 2 puffs into the lungs 2 (two) times daily. Rinse mouth Patient taking differently: Inhale 2 puffs into the lungs daily as needed (for shortness of breath). Rinse mouth 11/01/11 12/08/21  Baird Lyons D, MD  Cholecalciferol (VITAMIN D3) 1000 UNITS CAPS Take 1,000 Units by mouth daily.    [provider]  conjugated estrogens (PREMARIN) vaginal cream USE AS DIRECTED Patient not taking: Reported on 12/08/2020 05/05/15   Orlena Sheldon, PA-C  cromolyn (NASALCROM) 5.2 MG/ACT nasal spray Place 1 spray into the nose 4 (four) times daily as needed for allergies or rhinitis.     [provider]  diazepam (VALIUM) 10 MG tablet Take 1 tablet by mouth as needed for anxiety or sleep.  09/04/12   [provider]  estradiol (VIVELLE-DOT) 0.05 MG/24HR patch APPLY 1 PATCH EXTERNALLY TO THE SKIN EVERY 72 HOURS Patient taking differently: Place 1 patch onto the skin every 3 (three) days. 04/10/16   Dena Billet B, PA-C  fluocinonide cream (LIDEX) 2.99 % Apply 1 application topically daily as needed (itching). 04/23/19   [provider]  levothyroxine (SYNTHROID, LEVOTHROID) 100 MCG tablet Take 100 mcg by mouth every other day.     [provider]  MOVANTIK 25 MG TABS tablet Take 1 tablet (25 mg total) by mouth daily as needed (constipation). 05/08/19   Rai, Vernelle Emerald, MD  nystatin (MYCOSTATIN) powder Apply topically 2 (two) times daily. To effected areas Patient taking differently: Apply topically 2 (two) times daily as needed (skin irritation). To effected areas 12/01/14   Orlena Sheldon,  PA-C  omeprazole (PRILOSEC) 40 MG capsule Take 40 mg by mouth daily.  01/21/14   [provider]  Oxycodone HCl 10 MG TABS Take 10 mg by mouth 2 (two) times daily. 12/02/20   [provider]  polyethylene glycol powder (MIRALAX) 17 GM/SCOOP powder Start taking 1 capful 3 times a day. Slowly cut back as needed until you have normal bowel movements. 12/08/20   Cardama, Grayce Sessions, MD  Spacer/Aero-Holding Chambers (AEROCHAMBER MV) inhaler by Other route. Use as instructed     [provider]  SYNTHROID 112 MCG tablet Take 112 mcg by mouth every other day. 07/17/19   [provider]    Physical Exam: Vitals:   02/02/21 1730 02/02/21 1846 02/02/21 2103 02/02/21 2300  BP: (!) 173/83 (!) 119/58 (!) 158/91   Pulse: 70 72 (!) 110   Resp: 18 18 18  Temp:   98.2 F (36.8 C)   TempSrc:   Oral   SpO2: 97% 100% 96%   Weight:    49.2 kg  Height:    5' (1.524 m)    Physical Exam Constitutional:      General: She is not in acute distress. HENT:     Head: Normocephalic and atraumatic.  Eyes:     Extraocular Movements: Extraocular movements intact.     Conjunctiva/sclera: Conjunctivae normal.  Cardiovascular:     Rate and Rhythm: Regular rhythm. Tachycardia present.     Pulses: Normal pulses.     Comments: Slightly tachycardic Pulmonary:     Effort: Pulmonary effort is normal. No respiratory distress.     Breath sounds: No wheezing or rales.  Abdominal:     General: Bowel sounds are normal. There is distension.     Palpations: Abdomen is soft.     Tenderness: There is no abdominal tenderness. There is no guarding or rebound.  Musculoskeletal:     Cervical back: Normal range of motion and neck supple.  Skin:    General: Skin is warm and dry.     Findings: Erythema present.     Comments: Multiple skin tears noted mostly on the right lower leg with surrounding erythema   Neurological:     Cranial Nerves: No cranial nerve deficit.     Sensory: No  sensory deficit.     Motor: No weakness.     Comments: Somnolent but easily arousable     Labs on Admission: I have personally reviewed following labs and imaging studies  CBC: Recent Labs  Lab 02/02/21 1722  WBC 12.9*  NEUTROABS 9.9*  HGB 13.3  HCT 41.4  MCV 89.6  PLT 250   Basic Metabolic Panel: Recent Labs  Lab 02/02/21 1722 02/02/21 2311  NA 126* 127*  K 4.1 3.7  CL 94* 97*  CO2 26 24  GLUCOSE 107* 109*  BUN 23 24*  CREATININE 1.12* 1.04*  CALCIUM 9.7 9.1   GFR: Estimated Creatinine Clearance: 27.9 mL/min (A) (by C-G formula based on SCr of 1.04 mg/dL (H)). Liver Function Tests: Recent Labs  Lab 02/02/21 1722  AST 14*  ALT 10  ALKPHOS 92  BILITOT 0.7  PROT 8.1  ALBUMIN 4.1   Recent Labs  Lab 02/02/21 1722  LIPASE 34   Recent Labs  Lab 02/02/21 2311  AMMONIA 24   Coagulation Profile: No results for input(s): INR, PROTIME in the last 168 hours. Cardiac Enzymes: No results for input(s): CKTOTAL, CKMB, CKMBINDEX, TROPONINI in the last 168 hours. BNP (last 3 results) No results for input(s): PROBNP in the last 8760 hours. HbA1C: No results for input(s): HGBA1C in the last 72 hours. CBG: No results for input(s): GLUCAP in the last 168 hours. Lipid Profile: No results for input(s): CHOL, HDL, LDLCALC, TRIG, CHOLHDL, LDLDIRECT in the last 72 hours. Thyroid Function Tests: Recent Labs    02/02/21 2311  TSH 3.950   Anemia Panel: Recent Labs    02/02/21 2311  VITAMINB12 172*   Urine analysis:    Component Value Date/Time   COLORURINE YELLOW 12/08/2020 0042   APPEARANCEUR CLEAR 12/08/2020 0042   LABSPEC 1.011 12/08/2020 0042   PHURINE 6.0 12/08/2020 0042   GLUCOSEU NEGATIVE 12/08/2020 0042   HGBUR NEGATIVE 12/08/2020 0042   BILIRUBINUR NEGATIVE 12/08/2020 [place] 12/08/2020 0042   PROTEINUR 30 (A) 12/08/2020 0042   UROBILINOGEN 0.2 12/05/2009 1511   NITRITE NEGATIVE 12/08/2020 0042  LEUKOCYTESUR NEGATIVE  12/08/2020 0042    Radiological Exams on Admission: CT ABDOMEN PELVIS WO CONTRAST  Result Date: 02/02/2021 CLINICAL DATA:  Abdominal pain, acute, nonlocalized EXAM: CT ABDOMEN AND PELVIS WITHOUT CONTRAST TECHNIQUE: Multidetector CT imaging of the abdomen and pelvis was performed following the standard protocol without IV contrast. COMPARISON:  None. FINDINGS: Lower chest: Bilateral lower lobe scarring versus atelectasis. Bronchial wall thickening. Otherwise no acute abnormality. Hepatobiliary: No focal liver abnormality. No gallstones, gallbladder wall thickening, or pericholecystic fluid. No biliary dilatation. Pancreas: Diffusely atrophic. No focal lesion. Otherwise normal pancreatic contour. No surrounding inflammatory changes. No main pancreatic ductal dilatation. Spleen: Normal in size without focal abnormality. Adrenals/Urinary Tract: The adrenal glands are not well visualized.  No definite nodularity. No nephrolithiasis and no hydronephrosis. No definite contour-deforming renal mass. No ureterolithiasis or hydroureter. The urinary bladder is unremarkable. Stomach/Bowel: Stomach is within normal limits. No evidence of small bowel wall thickening or dilatation. Stool noted throughout the ascending colon and mid transverse colon. 8 cm rectal stool ball. Mild perirectal and proximal colon fat stranding. Possible mild rectosigmoid bowel wall thickening just proximal to the rectal stool ball. Status post appendectomy. Vascular/Lymphatic: No abdominal aorta or iliac aneurysm. Severe atherosclerotic plaque of the aorta and its branches. No abdominal, pelvic, or inguinal lymphadenopathy. Reproductive: Status post hysterectomy. No adnexal masses. Other: No intraperitoneal free fluid. No intraperitoneal free gas. No organized fluid collection. Musculoskeletal: No abdominal wall hernia or abnormality. No suspicious lytic or blastic osseous lesions. No acute displaced fracture. Thoracolumbar levoscoliosis. L4-L5  posterolateral and interbody fusion. Bilateral hip degenerative changes. IMPRESSION: 1. An 8 cm rectal stool ball with query developing cervical or colitis. 2.  Aortic Atherosclerosis (ICD10-I70.0). 3. Please note markedly limited evaluation on this noncontrast study. Electronically Signed   By: Iven Finn M.D.   On: 02/02/2021 18:23   CT Head Wo Contrast  Result Date: 02/02/2021 CLINICAL DATA:  Altered level of consciousness EXAM: CT HEAD WITHOUT CONTRAST TECHNIQUE: Contiguous axial images were obtained from the base of the skull through the vertex without intravenous contrast. COMPARISON:  07/11/2009 FINDINGS: Brain: No acute infarct or hemorrhage. Lateral ventricles and midline structures are unremarkable. No acute extra-axial fluid collections. No mass effect. Vascular: No hyperdense vessel or unexpected calcification. Skull: Normal. Negative for fracture or focal lesion. Sinuses/Orbits: No acute finding. Other: None. IMPRESSION: 1. No acute intracranial process. Electronically Signed   By: Randa Ngo M.D.   On: 02/02/2021 20:16   DG CHEST PORT 1 VIEW  Result Date: 02/02/2021 CLINICAL DATA:  Sepsis. EXAM: PORTABLE CHEST 1 VIEW COMPARISON:  Chest x-ray 08/19/2019. FINDINGS: The aorta is tortuous. Cardiac silhouette is within normal limits. There is no focal lung consolidation, pleural effusion or pneumothorax. Right shoulder arthroplasty is present. There is stable scoliosis of the thoracolumbar spine. Lumbar fusion hardware is partially visualized. IMPRESSION: No active disease. Electronically Signed   By: Ronney Asters M.D.   On: 02/02/2021 23:45    Assessment/Plan Principal Problem:   Severe sepsis (HCC) Active Problems:   Constipation   Cellulitis   Hyponatremia   Acute metabolic encephalopathy   Severe sepsis Presumed right lower extremity cellulitis Patient has multiple skin tears, mostly on the right lower extremity with surrounding erythema concerning for possible cellulitis.   Meets criteria for severe sepsis with tachycardia, leukocytosis, lactic acidosis, and acute encephalopathy.  Initial lactate was normal but repeat came back elevated at 3.9.  She is slightly tachycardic now.  Not hypotensive.  CT abdomen pelvis showing stool throughout  the ascending colon and mid transverse colon.  8 cm rectal stool ball.  Mild perirectal and proximal colon fat stranding.  Possible mild rectosigmoid bowel wall thickening just proximal to the rectal stool ball.  COVID and influenza PCR negative, UA pending, chest x-ray not suggestive of pneumonia. -Cover with broad-spectrum antibiotics at this time (vancomycin and meropenem).  Discussed with pharmacy, she is allergic to several different antibiotics including penicillin/cephalosporins.  She received 500 cc normal saline bolus in the ED.  Give additional 500 cc normal saline bolus and continue maintenance IV fluid.  Repeat lactate.  Blood cultures pending.  Check procalcitonin level and monitor WBC count.  Severe constipation Manual fecal disimpaction attempted by ED physician but patient could not tolerate it due to discomfort from hemorrhoids.  She was given Fleet enema. -Continue aggressive bowel regimen -senna, docusate, MiraLAX, tapwater enema.  Acute on chronic hyponatremia Patient appears dehydrated on exam.  Sodium 126, chronically in the upper 120s to low 130s. -IV fluid hydration and continue to monitor BMP every 6 hours.  Goal rate of correction is 4 to 6 mEq in 24 hours.  Check serum osmolarity.  Check urine sodium and osmolarity.  Check TSH level.  Acute metabolic encephalopathy Likely due to conditions listed above.  Does have baseline dementia.  CT head negative for acute finding.  Patient is somnolent but easily arousable.  No focal neurodeficit.  Ammonia level normal. -Continue antibiotics and IV fluids as mentioned above.  Check TSH and B12 levels.  Hold home oxycodone and Valium.  Avoid sedating  medications.  Generalized weakness/multiple falls -PT/OT eval, fall precautions  Multiple skin tears -Wound care consulted  Asthma Stable.  No signs of acute exacerbation. -Continue home medications  Hypothyroidism -Continue Synthroid and check TSH level  GERD -Continue PPI  Hypertension -Avoid antihypertensives at this time given concern for severe sepsis.  CKD stage IIIa Stable.  Creatinine at baseline.  DVT prophylaxis: Lovenox Code Status: Full code Family Communication: No family available at this time. Disposition Plan: Status is: Inpatient  Remains inpatient appropriate because: Severe sepsis  Level of care: Level of care: Telemetry  The medical decision making on this patient was of high complexity and the patient is at high risk for clinical deterioration, therefore this is a level 3 visit.  Shela Leff MD [facility]  If 7PM-7AM, please contact night-coverage [email or web address]  02/03/2021, 12:27 AM

## 2021-02-02 NOTE — ED Triage Notes (Signed)
BIBA from home with c/o constipation per EMS. Pt has been constipated for about a week and has leakage coming out around the blockage continuously. Also reports Increased moments of confusion with no known hx of dementia or alzheimer.  146/96 68 hr 94 room air

## 2021-02-02 NOTE — Progress Notes (Addendum)
Pharmacy Antibiotic Note  Penny Perez is a 88 y.o. female admitted on 02/02/2021 with left lower leg cellulitis and sepsis.  Pharmacy has been consulted for Vancomycin and Meropenem dosing.  Patient received Vancomycin 1gm IV x 1 dose in the ED.  Plan: Vancomycin 750 mg IV Q 36 hrs. Goal AUC 400-550.  Expected AUC: 512.3  SCr used: 1.04 Meropenem 1gm IV q12h F/u culture results and sensitivities Follow renal function   Height: 5' (152.4 cm) Weight: 49.2 kg (108 lb 7.5 oz) IBW/kg (Calculated) : 45.5  Temp (24hrs), Avg:97.9 F (36.6 C), Min:97.6 F (36.4 C), Max:98.2 F (36.8 C)  Recent Labs  Lab 02/02/21 1722 02/02/21 1931 02/02/21 2127 02/02/21 2311  WBC 12.9*  --   --   --   CREATININE 1.12*  --   --  1.04*  LATICACIDVEN  --  1.0 3.9*  --     Estimated Creatinine Clearance: 27.9 mL/min (A) (by C-G formula based on SCr of 1.04 mg/dL (H)).    Allergies  Allergen Reactions   Linezolid Nausea And Vomiting   Sulfa Antibiotics     Other reaction(s): Other Abdominal pain and bloating Abdominal pain and bloating   Ampicillin Other (See Comments)    Ulcers in mouth,stomach upset   Cephalexin Other (See Comments)    Mouth ulcer   Clarithromycin Other (See Comments)    Mouth ulcer   Codeine Other (See Comments)    unknown   Doxycycline Other (See Comments)    Mouth ulcer   Erythromycin Other (See Comments)    Mouth ulcer   Gatifloxacin Other (See Comments)    Mouth ulcer   Hydrocodone Other (See Comments)    unknown   Penicillins Other (See Comments)    Mouth ulcer   Latex Rash   Tape Rash    Antimicrobials this admission: 1/5/ Vancomycin >>   1/6 Meropenem >>    Dose adjustments this admission:    Microbiology results: 1/5 BCx:     Thank you for allowing pharmacy to be a part of this patients care.  Everette Rank, PharmD 02/02/2021 11:56 PM

## 2021-02-03 DIAGNOSIS — K649 Unspecified hemorrhoids: Secondary | ICD-10-CM | POA: Diagnosis present

## 2021-02-03 DIAGNOSIS — F0390 Unspecified dementia without behavioral disturbance: Secondary | ICD-10-CM | POA: Diagnosis present

## 2021-02-03 DIAGNOSIS — E871 Hypo-osmolality and hyponatremia: Secondary | ICD-10-CM | POA: Diagnosis present

## 2021-02-03 DIAGNOSIS — R296 Repeated falls: Secondary | ICD-10-CM | POA: Diagnosis present

## 2021-02-03 DIAGNOSIS — M797 Fibromyalgia: Secondary | ICD-10-CM | POA: Diagnosis present

## 2021-02-03 DIAGNOSIS — I129 Hypertensive chronic kidney disease with stage 1 through stage 4 chronic kidney disease, or unspecified chronic kidney disease: Secondary | ICD-10-CM | POA: Diagnosis present

## 2021-02-03 DIAGNOSIS — J309 Allergic rhinitis, unspecified: Secondary | ICD-10-CM | POA: Diagnosis present

## 2021-02-03 DIAGNOSIS — L0390 Cellulitis, unspecified: Secondary | ICD-10-CM | POA: Diagnosis present

## 2021-02-03 DIAGNOSIS — E876 Hypokalemia: Secondary | ICD-10-CM | POA: Diagnosis present

## 2021-02-03 DIAGNOSIS — E860 Dehydration: Secondary | ICD-10-CM | POA: Diagnosis present

## 2021-02-03 DIAGNOSIS — X58XXXA Exposure to other specified factors, initial encounter: Secondary | ICD-10-CM | POA: Diagnosis present

## 2021-02-03 DIAGNOSIS — A419 Sepsis, unspecified organism: Secondary | ICD-10-CM | POA: Diagnosis present

## 2021-02-03 DIAGNOSIS — G9341 Metabolic encephalopathy: Secondary | ICD-10-CM | POA: Diagnosis present

## 2021-02-03 DIAGNOSIS — J45998 Other asthma: Secondary | ICD-10-CM | POA: Diagnosis present

## 2021-02-03 DIAGNOSIS — L03115 Cellulitis of right lower limb: Secondary | ICD-10-CM | POA: Diagnosis present

## 2021-02-03 DIAGNOSIS — E039 Hypothyroidism, unspecified: Secondary | ICD-10-CM | POA: Diagnosis present

## 2021-02-03 DIAGNOSIS — K219 Gastro-esophageal reflux disease without esophagitis: Secondary | ICD-10-CM | POA: Diagnosis present

## 2021-02-03 DIAGNOSIS — M1990 Unspecified osteoarthritis, unspecified site: Secondary | ICD-10-CM | POA: Diagnosis present

## 2021-02-03 DIAGNOSIS — S81811A Laceration without foreign body, right lower leg, initial encounter: Secondary | ICD-10-CM | POA: Diagnosis present

## 2021-02-03 DIAGNOSIS — N1831 Chronic kidney disease, stage 3a: Secondary | ICD-10-CM | POA: Diagnosis present

## 2021-02-03 DIAGNOSIS — R6520 Severe sepsis without septic shock: Secondary | ICD-10-CM | POA: Diagnosis present

## 2021-02-03 DIAGNOSIS — K5909 Other constipation: Secondary | ICD-10-CM | POA: Diagnosis present

## 2021-02-03 DIAGNOSIS — E872 Acidosis, unspecified: Secondary | ICD-10-CM | POA: Diagnosis present

## 2021-02-03 DIAGNOSIS — H9190 Unspecified hearing loss, unspecified ear: Secondary | ICD-10-CM | POA: Diagnosis present

## 2021-02-03 DIAGNOSIS — Z20822 Contact with and (suspected) exposure to covid-19: Secondary | ICD-10-CM | POA: Diagnosis present

## 2021-02-03 DIAGNOSIS — S81812A Laceration without foreign body, left lower leg, initial encounter: Secondary | ICD-10-CM | POA: Diagnosis present

## 2021-02-03 LAB — PROCALCITONIN: Procalcitonin: 0.16 ng/mL

## 2021-02-03 LAB — BASIC METABOLIC PANEL
Anion gap: 8 (ref 5–15)
BUN: 22 mg/dL (ref 8–23)
CO2: 22 mmol/L (ref 22–32)
Calcium: 8.8 mg/dL — ABNORMAL LOW (ref 8.9–10.3)
Chloride: 98 mmol/L (ref 98–111)
Creatinine, Ser: 1.1 mg/dL — ABNORMAL HIGH (ref 0.44–1.00)
GFR, Estimated: 49 mL/min — ABNORMAL LOW (ref >60)
Glucose, Bld: 99 mg/dL (ref 70–99)
Potassium: 3.6 mmol/L (ref 3.5–5.1)
Sodium: 128 mmol/L — ABNORMAL LOW (ref 135–145)

## 2021-02-03 LAB — OSMOLALITY: Osmolality: 275 mOsm/kg (ref 275–295)

## 2021-02-03 LAB — URINALYSIS, ROUTINE W REFLEX MICROSCOPIC
Bilirubin Urine: NEGATIVE
Glucose, UA: NEGATIVE mg/dL
Hgb urine dipstick: NEGATIVE
Ketones, ur: 5 mg/dL — AB
Nitrite: NEGATIVE
Protein, ur: 30 mg/dL — AB
Specific Gravity, Urine: 1.014 (ref 1.005–1.030)
pH: 5 (ref 5.0–8.0)

## 2021-02-03 LAB — CBC
HCT: 33.9 % — ABNORMAL LOW (ref 36.0–46.0)
Hemoglobin: 11.2 g/dL — ABNORMAL LOW (ref 12.0–15.0)
MCH: 29.4 pg (ref 26.0–34.0)
MCHC: 33 g/dL (ref 30.0–36.0)
MCV: 89 fL (ref 80.0–100.0)
Platelets: 165 10*3/uL (ref 150–400)
RBC: 3.81 MIL/uL — ABNORMAL LOW (ref 3.87–5.11)
RDW: 14.6 % (ref 11.5–15.5)
WBC: 14.9 10*3/uL — ABNORMAL HIGH (ref 4.0–10.5)
nRBC: 0 % (ref 0.0–0.2)

## 2021-02-03 LAB — OSMOLALITY, URINE: Osmolality, Ur: 456 mOsm/kg (ref 300–900)

## 2021-02-03 LAB — LACTIC ACID, PLASMA: Lactic Acid, Venous: 1 mmol/L (ref 0.5–1.9)

## 2021-02-03 LAB — VITAMIN B12: Vitamin B-12: 172 pg/mL — ABNORMAL LOW (ref 180–914)

## 2021-02-03 LAB — TSH: TSH: 3.95 u[IU]/mL (ref 0.350–4.500)

## 2021-02-03 LAB — SODIUM, URINE, RANDOM: Sodium, Ur: 48 mmol/L

## 2021-02-03 MED ORDER — SODIUM CHLORIDE 0.9 % IV SOLN: INTRAVENOUS | Status: AC

## 2021-02-03 MED ORDER — OXYCODONE HCL 5 MG PO TABS
10.0000 mg | ORAL_TABLET | Freq: Three times a day (TID) | ORAL | Status: DC | PRN
  Administered 2021-02-03 – 2021-02-09 (×14): 10 mg via ORAL
  Filled 2021-02-03 (×15): qty 2

## 2021-02-03 MED ORDER — BISACODYL 10 MG RE SUPP
10.0000 mg | Freq: Once | RECTAL | Status: AC
  Administered 2021-02-03: 10 mg via RECTAL
  Filled 2021-02-03: qty 1

## 2021-02-03 MED ORDER — NALOXEGOL OXALATE 25 MG PO TABS
25.0000 mg | ORAL_TABLET | Freq: Every day | ORAL | Status: DC | PRN
  Administered 2021-02-03 – 2021-02-07 (×2): 25 mg via ORAL
  Filled 2021-02-03 (×4): qty 1

## 2021-02-03 MED ORDER — SODIUM CHLORIDE 0.9 % IV SOLN
1.0000 g | Freq: Two times a day (BID) | INTRAVENOUS | Status: DC
  Administered 2021-02-03: 1 g via INTRAVENOUS
  Filled 2021-02-03 (×2): qty 1

## 2021-02-03 NOTE — Consult Note (Signed)
WOC Nurse Consult Note: Reason for Consult: skin tears to bilateral LEs and left arm Wound type: trauma sustained during falls Pressure Injury POA:N/A Measurement: Left arm:8cm x 4cm x 0.1cm Left LE:11cm x 5cm x 0.1cm Right LE: 16cm x 14cm x 0.1cm Wound WTK:TCCE pink, dry Drainage (amount, consistency, odor) small serous Periwound: thin, fragile Dressing procedure/placement/frequency: Topical care will be with saline cleansing once daily followed by placement of nonadherent wound contact layer (white petrolatum/Vaseline). This is to be covered with ABD pads and secured with Kerlix roll gauze/paper tape. A sacral prophylactic foam dressing is to be placed  for pressure injury prevention as well as turning from side to side with time in the supine position minimized. Prevalon boots are provided for heel floatation/PI prevention.  [facility] nursing team will not follow, but will remain available to this patient, the nursing and medical teams.  Please re-consult if needed. Thanks, Maudie Flakes, MSN, RN, [place], [facility]  Pager# [phone]

## 2021-02-03 NOTE — Evaluation (Signed)
Physical Therapy Evaluation Patient Details Name: Penny Perez MRN: [id number] DOB: September 18, 1932 Today's Date: 02/03/2021  History of Present Illness  Pt admitted from home 2* constipation, multiple falls and skin tears bilat shins (dressings in place).  Pt with hx of DDD, scoliosis, back surgery, Fibromyalgia, bil THR, dementia, chronic hyponatremia, , CKDIIIa and hearing impairment  Clinical Impression  Pt admitted as above and presenting with functional mobility limitations 2* generalized weakness, LE pain, balance deficits and limited endurance.  Pt would benefit from follow up rehab at SNF level to maximize IND and safety prior to return home with limited assist.     Recommendations for follow up therapy are one component of a multi-disciplinary discharge planning process, led by the attending physician.  Recommendations may be updated based on patient status, additional functional criteria and insurance authorization.  Follow Up Recommendations Skilled nursing-short term rehab (<3 hours/day)    Assistance Recommended at Discharge Frequent or constant Supervision/Assistance  Patient can return home with the following       Equipment Recommendations None recommended by PT  Recommendations for Other Services       Functional Status Assessment Patient has had a recent decline in their functional status and demonstrates the ability to make significant improvements in function in a reasonable and predictable amount of time.     Precautions / Restrictions Precautions Precautions: Fall Restrictions Weight Bearing Restrictions: No      Mobility  Bed Mobility Overal bed mobility: Needs Assistance Bed Mobility: Supine to Sit     Supine to sit: Mod assist     General bed mobility comments: assist to rotate to EOB sitting with use of pad; to manage LEs and to bring trunk to upright    Transfers Overall transfer level: Needs assistance Equipment used: Rolling walker (2  wheels) Transfers: Sit to/from Stand;Bed to chair/wheelchair/BSC Sit to Stand: Min assist;Mod assist   Step pivot transfers: Min assist (elevated BSC to recliner)       General transfer comment: cues for LE management and use of UEs to self assist;  Physical assist to bring wt up and fwd and to balance in intial standing    Ambulation/Gait Ambulation/Gait assistance: Min assist Gait Distance (Feet): 21 Feet Assistive device: Rolling walker (2 wheels) Gait Pattern/deviations: Step-to pattern;Step-through pattern;Decreased step length - right;Decreased step length - left;Shuffle;Trunk flexed Gait velocity: decr     General Gait Details: Increased time with cues for posture and position from RW;  Physical assist for balance/support and RW management  Stairs            Wheelchair Mobility    Modified Rankin (Stroke Patients Only)       Balance Overall balance assessment: Needs assistance Sitting-balance support: No upper extremity supported;Feet supported Sitting balance-Leahy Scale: Fair     Standing balance support: Bilateral upper extremity supported Standing balance-Leahy Scale: Poor                               Pertinent Vitals/Pain Pain Assessment: Faces Faces Pain Scale: Hurts even more Pain Location: bil LEs and buttocks Pain Descriptors / Indicators: Burning;Sore Pain Intervention(s): Limited activity within patient's tolerance;Monitored during session;Repositioned    Home Living Family/patient expects to be discharged to:: Skilled nursing facility Living Arrangements: Alone Available Help at Discharge: Family;Available PRN/intermittently Type of Home: House Home Access: Stairs to enter Entrance Stairs-Rails: Left Entrance Stairs-Number of Steps: 5   Home Layout: Multi-level;Able to live on  main level with bedroom/bathroom Home Equipment: Conservation officer, nature (2 wheels)      Prior Function Prior Level of Function : Independent/Modified  Independent             Mobility Comments: Using RW as needed but reports multiple falls       Hand Dominance   Dominant Hand: Right    Extremity/Trunk Assessment   Upper Extremity Assessment Upper Extremity Assessment: Defer to OT evaluation;Generalized weakness    Lower Extremity Assessment Lower Extremity Assessment: Generalized weakness;RLE deficits/detail;LLE deficits/detail RLE: Unable to fully assess due to pain LLE: Unable to fully assess due to pain    Cervical / Trunk Assessment Cervical / Trunk Assessment: Kyphotic  Communication   Communication: HOH  Cognition Arousal/Alertness: Awake/alert Behavior During Therapy: WFL for tasks assessed/performed Overall Cognitive Status: History of cognitive impairments - at baseline                                          General Comments      Exercises     Assessment/Plan    PT Assessment Patient needs continued PT services  PT Problem List Decreased strength;Decreased range of motion;Decreased activity tolerance;Decreased balance;Decreased mobility;Decreased knowledge of use of DME;Pain       PT Treatment Interventions DME instruction;Gait training;Functional mobility training;Therapeutic exercise;Therapeutic activities;Balance training;Patient/family education    PT Goals (Current goals can be found in the Care Plan section)  Acute Rehab PT Goals Patient Stated Goal: Less pain, regain IND PT Goal Formulation: With patient Time For Goal Achievement: 02/17/21 Potential to Achieve Goals: Fair    Frequency Min 3X/week     Co-evaluation PT/OT/SLP Co-Evaluation/Treatment: Yes Reason for Co-Treatment: To address functional/ADL transfers PT goals addressed during session: Mobility/safety with mobility OT goals addressed during session: ADL's and self-care       AM-PAC PT "6 Clicks" Mobility  Outcome Measure Help needed turning from your back to your side while in a flat bed without  using bedrails?: A Lot Help needed moving from lying on your back to sitting on the side of a flat bed without using bedrails?: A Lot Help needed moving to and from a bed to a chair (including a wheelchair)?: A Lot Help needed standing up from a chair using your arms (e.g., wheelchair or bedside chair)?: A Lot Help needed to walk in hospital room?: A Lot Help needed climbing 3-5 steps with a railing? : A Lot 6 Click Score: 12    End of Session Equipment Utilized During Treatment: Gait belt Activity Tolerance: Patient limited by fatigue;Patient limited by pain Patient left: in chair;with call bell/phone within reach Nurse Communication: Mobility status PT Visit Diagnosis: Difficulty in walking, not elsewhere classified (R26.2);Muscle weakness (generalized) (M62.81)    Time: 6387-5643 PT Time Calculation (min) (ACUTE ONLY): 34 min   Charges:   PT Evaluation $PT Eval Low Complexity: 1 Low PT Treatments $Gait Training: 8-22 mins        [place] Pager [phone] Office [phone]   Ethylene Reznick 02/03/2021, 3:29 PM

## 2021-02-03 NOTE — Evaluation (Signed)
Occupational Therapy Evaluation Patient Details Name: Penny Perez MRN: [id number] DOB: 12-Aug-1932 Today's Date: 02/03/2021   History of Present Illness Pt admitted from home 2* constipation, multiple falls and skin tears bilat shins (dressings in place).  Pt with hx of DDD, scoliosis, back surgery, Fibromyalgia, bil THR, dementia, chronic hyponatremia, , CKDIIIa and hearing impairment   Clinical Impression   Mrs. Penny Perez is an 88 year old woman admitted to hospital with above medical history and presents with generalized weakness, decreased activity tolerance, impaired balance and pain. Patient needing min assist to ambulate with walker and increased assistance with ADLs including max-total assist for LB ADLs and toileting. Patient will benefit from skilled OT services while in hospital to improve deficits and learn compensatory strategies as needed in order to return to PLOF.  Recommend short term rehab at discharge.     Recommendations for follow up therapy are one component of a multi-disciplinary discharge planning process, led by the attending physician.  Recommendations may be updated based on patient status, additional functional criteria and insurance authorization.   Follow Up Recommendations  Skilled nursing-short term rehab (<3 hours/day)    Assistance Recommended at Discharge Frequent or constant Supervision/Assistance  Patient can return home with the following A little help with walking and/or transfers;A lot of help with bathing/dressing/bathroom;Direct supervision/assist for medications management;Assistance with cooking/housework    Functional Status Assessment  Patient has had a recent decline in their functional status and demonstrates the ability to make significant improvements in function in a reasonable and predictable amount of time.  Equipment Recommendations  None recommended by OT    Recommendations for Other Services       Precautions /  Restrictions Precautions Precautions: Fall Restrictions Weight Bearing Restrictions: No      Mobility Bed Mobility Overal bed mobility: Needs Assistance Bed Mobility: Supine to Sit     Supine to sit: Mod assist     General bed mobility comments: up with PT    Transfers Overall transfer level: Needs assistance Equipment used: Rolling walker (2 wheels) Transfers: Sit to/from Stand;Bed to chair/wheelchair/BSC Sit to Stand: Min assist;Mod assist     Step pivot transfers: Min assist     General transfer comment: cues for LE management and use of UEs to self assist;  Physical assist to bring wt up and fwd and to balance in intial standing      Balance Overall balance assessment: Needs assistance Sitting-balance support: No upper extremity supported;Feet supported Sitting balance-Leahy Scale: Fair     Standing balance support: Bilateral upper extremity supported;Reliant on assistive device for balance Standing balance-Leahy Scale: Poor                             ADL either performed or assessed with clinical judgement   ADL Overall ADL's : Needs assistance/impaired Eating/Feeding: Independent   Grooming: Set up;Sitting   Upper Body Bathing: Set up;Sitting   Lower Body Bathing: Maximal assistance;Sit to/from stand   Upper Body Dressing : Set up;Sitting   Lower Body Dressing: Maximal assistance;Sit to/from stand   Toilet Transfer: Minimal assistance;Rolling walker (2 wheels)   Toileting- Clothing Manipulation and Hygiene: Sit to/from stand;Total assistance Toileting - Clothing Manipulation Details (indicate cue type and reason): reliant on holding walker - and BSC very high for her.     Functional mobility during ADLs: Minimal assistance;Rolling walker (2 wheels)       Vision Patient Visual Report: No change from baseline  Vision Assessment?: No apparent visual deficits     Perception     Praxis      Pertinent Vitals/Pain Pain Assessment:  Faces Faces Pain Scale: Hurts even more Pain Location: bil LEs and buttocks Pain Descriptors / Indicators: Burning;Sore Pain Intervention(s): Limited activity within patient's tolerance     Hand Dominance Right   Extremity/Trunk Assessment Upper Extremity Assessment Upper Extremity Assessment: RUE deficits/detail;LUE deficits/detail RUE Deficits / Details: WFL ROM, 3+/5 shoulder strength, 4-/5 elbow, 5/5 wrist, 4/5 grip RUE Sensation: WNL RUE Coordination: WNL LUE Deficits / Details: WFL ROM, 3-/5 shoulder strength, 4/5 elbow, 5/5 wrist, 4/5 grip LUE Sensation: WNL LUE Coordination: WNL   Lower Extremity Assessment Lower Extremity Assessment: Defer to PT evaluation RLE: Unable to fully assess due to pain LLE: Unable to fully assess due to pain   Cervical / Trunk Assessment Cervical / Trunk Assessment: Kyphotic   Communication Communication Communication: HOH   Cognition Arousal/Alertness: Awake/alert Behavior During Therapy: WFL for tasks assessed/performed Overall Cognitive Status: Within Functional Limits for tasks assessed                                 General Comments: HOH     General Comments       Exercises     Shoulder Instructions      Home Living Family/patient expects to be discharged to:: Skilled nursing facility Living Arrangements: Alone Available Help at Discharge: Family;Available PRN/intermittently Type of Home: House Home Access: Stairs to enter [employer] of Steps: 5 Entrance Stairs-Rails: Left Home Layout: Multi-level;Able to live on main level with bedroom/bathroom     Bathroom Shower/Tub: Walk-in shower   Bathroom Toilet: Handicapped height     Home Equipment: Conservation officer, nature (2 wheels)          Prior Functioning/Environment Prior Level of Function : Independent/Modified Independent             Mobility Comments: Using RW as needed but reports multiple falls          OT Problem List:  Decreased strength;Decreased range of motion;Decreased activity tolerance;Impaired balance (sitting and/or standing);Pain;Decreased knowledge of use of DME or AE      OT Treatment/Interventions: Self-care/ADL training;Therapeutic exercise;DME and/or AE instruction;Therapeutic activities;Balance training;Patient/family education    OT Goals(Current goals can be found in the care plan section) Acute Rehab OT Goals Patient Stated Goal: get stronger OT Goal Formulation: With patient Time For Goal Achievement: 02/17/21 Potential to Achieve Goals: Good  OT Frequency: Min 2X/week    Co-evaluation PT/OT/SLP Co-Evaluation/Treatment: Yes (c o eval) Reason for Co-Treatment: To address functional/ADL transfers PT goals addressed during session: Mobility/safety with mobility OT goals addressed during session: ADL's and self-care      AM-PAC OT "6 Clicks" Daily Activity     Outcome Measure Help from another person eating meals?: None Help from another person taking care of personal grooming?: A Little Help from another person toileting, which includes using toliet, bedpan, or urinal?: Total Help from another person bathing (including washing, rinsing, drying)?: A Lot Help from another person to put on and taking off regular upper body clothing?: A Little Help from another person to put on and taking off regular lower body clothing?: A Lot 6 Click Score: 15   End of Session Equipment Utilized During Treatment: Rolling walker (2 wheels) Nurse Communication: Mobility status  Activity Tolerance: Patient limited by pain Patient left: in chair;with call bell/phone within reach;with chair  alarm set  OT Visit Diagnosis: Unsteadiness on feet (R26.81);Muscle weakness (generalized) (M62.81);Pain                Time: 4967-5916 OT Time Calculation (min): 21 min Charges:  OT General Charges $OT Visit: 1 Visit OT Evaluation $OT Eval Low Complexity: 1 Low  Lysha Schrade, OTR/L [place]   Office [phone] Pager: [place] 02/03/2021, 4:30 PM

## 2021-02-03 NOTE — Progress Notes (Signed)
New order placed for patient to have cardiac monitoring which is not available on this unit. Spoke w/Dr. Marlowe Sax who states she still wants patient to have telemetry. I explained that currently there are no telemetry beds in hospital. She states to monitor VS and transfer to telemetry bed when possible.

## 2021-02-03 NOTE — Progress Notes (Signed)
PROGRESS NOTE    Penny Perez  NFA:[id number] DOB: 04-Apr-1932 DOA: 02/02/2021 PCP: Chesley Noon, MD   Brief Narrative: No notes on file   Assessment & Plan:   Severe sepsis Possible RLE cellulitis Present on admission. Empiric Vancomycin and meropenem ordered on admission. Blood cultures obtained. Leukocytosis slightly worsened. Afebrile overnight. -Discontinue meropenem -Continue vancomycin -Follow-up blood cultures  Severe constipation Chronic issue. Failed fleet enema. -Continue home Movantik -Miralax, Colace, Senokot -Dulcolax suppository  Acute on chronic hyponatremia Likely secondary to dehydration. Started on IV fluids with mild improvement. -Re-dose normal saline for 12 hours  Acute metabolic encephalopathy Baseline dementia but appears to back to baseline currently.  Generalized weakness Multiple falls PT recommending SNF.  Multiple skin tears Wound care recommendations: Topical care will be with saline cleansing once daily followed by placement of nonadherent wound contact layer (white petrolatum/Vaseline). This is to be covered with ABD pads and secured with Kerlix roll gauze/paper tape. A sacral prophylactic foam dressing is to be placed  for pressure injury prevention as well as turning from side to side with time in the supine position minimized. Prevalon boots are provided for heel floatation/PI prevention.  Asthma Stable. No exacerbation. -Continue Dulera (substituted for home Symbicort) and albuterol  Hypothyroidism -Continue Synthroid  GERD -Continue Protonix (substituted for Prilosec)  Chronic pain -Resume home oxycodon  CKD stage IIIa Stable.   DVT prophylaxis: Lovenox Code Status:   Code Status: Full Code Family Communication: None at bedside. Patient declined to have me call family Disposition Plan: Discharge to SNF likely in 1-2 days pending negative cultures and outpatient antibiotic regimen, in addition to stable  sodium   Consultants:  None  Procedures:  None  Antimicrobials: Vancomycin Meropenem    Subjective: Some right leg pain, otherwise no concerns.  Objective: Vitals:   02/02/21 2103 02/02/21 2300 02/03/21 0108 02/03/21 0610  BP: (!) 158/91  118/66 127/69  Pulse: (!) 110  81 70  Resp: 18  16 16   Temp: 98.2 F (36.8 C)  98.4 F (36.9 C) 98.1 F (36.7 C)  TempSrc: Oral  Oral Oral  SpO2: 96%  96% 97%  Weight:  49.2 kg    Height:  5' (1.524 m)      Intake/Output Summary (Last 24 hours) at 02/03/2021 1059 Last data filed at 02/03/2021 0600 Gross per 24 hour  Intake 188.22 ml  Output 600 ml  Net -411.78 ml   Filed Weights   02/02/21 2300  Weight: 49.2 kg    Examination:  General exam: Appears calm and comfortable  Respiratory system: Clear to auscultation. Respiratory effort normal. Cardiovascular system: S1 & S2 heard, RRR. 2/6 systolic murmur Gastrointestinal system: Abdomen is nondistended, soft and nontender. No organomegaly or masses felt. Normal bowel sounds heard. Central nervous system: Alert and oriented. No focal neurological deficits. Musculoskeletal: No edema. No calf tenderness Skin: No cyanosis. Bilateral LE erythema noted, worse on right. Gauze bandages applied. Psychiatry: Judgement and insight appear normal. Mood & affect appropriate.     Data Reviewed: I have personally reviewed following labs and imaging studies  CBC Lab Results  Component Value Date   WBC 14.9 (H) 02/03/2021   RBC 3.81 (L) 02/03/2021   HGB 11.2 (L) 02/03/2021   HCT 33.9 (L) 02/03/2021   MCV 89.0 02/03/2021   MCH 29.4 02/03/2021   PLT 165 02/03/2021   MCHC 33.0 02/03/2021   RDW 14.6 02/03/2021   LYMPHSABS 1.5 02/02/2021   MONOABS 1.2 (H) 02/02/2021   EOSABS 0.1  02/02/2021   BASOSABS 0.0 54/27/0623     Last metabolic panel Lab Results  Component Value Date   NA 128 (L) 02/03/2021   K 3.6 02/03/2021   CL 98 02/03/2021   CO2 22 02/03/2021   BUN 22 02/03/2021    CREATININE 1.10 (H) 02/03/2021   GLUCOSE 99 02/03/2021   GFRNONAA 49 (L) 02/03/2021   GFRAA 38 (L) 08/28/2019   CALCIUM 8.8 (L) 02/03/2021   PHOS 2.3 (L) 08/21/2019   PROT 8.1 02/02/2021   ALBUMIN 4.1 02/02/2021   BILITOT 0.7 02/02/2021   ALKPHOS 92 02/02/2021   AST 14 (L) 02/02/2021   ALT 10 02/02/2021   ANIONGAP 8 02/03/2021    CBG (last 3)  No results for input(s): GLUCAP in the last 72 hours.   GFR: Estimated Creatinine Clearance: 26.4 mL/min (A) (by C-G formula based on SCr of 1.1 mg/dL (H)).  Coagulation Profile: No results for input(s): INR, PROTIME in the last 168 hours.  Recent Results (from the past 240 hour(s))  Culture, blood (routine x 2)     Status: None (Preliminary result)   Collection Time: 02/02/21  7:31 PM   Specimen: BLOOD  Result Value Ref Range Status   Specimen Description   Final    BLOOD BLOOD RIGHT FOREARM Performed at [facility] [address], [place], [place] [address]    Special Requests   Final    BOTTLES DRAWN AEROBIC ONLY Blood Culture results may not be optimal due to an inadequate volume of blood received in culture bottles Performed at [facility] [address] [place], [place] [address]    Culture   Final    NO GROWTH < 12 HOURS Performed at [facility] [address], [place], [place] [address]    Report Status PENDING  Incomplete  Culture, blood (routine x 2)     Status: None (Preliminary result)   Collection Time: 02/02/21  7:31 PM   Specimen: BLOOD  Result Value Ref Range Status   Specimen Description   Final    BLOOD LEFT ANTECUBITAL Performed at [facility] [address], [place], [place] [address]    Special Requests   Final    BOTTLES DRAWN AEROBIC AND ANAEROBIC Blood Culture adequate volume Performed at [facility] [address], [place], [place] [address]    Culture   Final    NO GROWTH < 12 HOURS Performed at [facility] [address] [place], [place] [address]    Report Status PENDING  Incomplete  Resp Panel by RT-PCR (Flu A&B, Covid) Nasopharyngeal Swab     Status: None   Collection Time: 02/02/21  7:33 PM   Specimen: Nasopharyngeal Swab; Nasopharyngeal(NP) swabs in vial transport medium  Result Value Ref Range Status   SARS Coronavirus 2 by RT PCR NEGATIVE NEGATIVE Final    Comment: (NOTE) SARS-CoV-2 target nucleic acids are NOT DETECTED.  The SARS-CoV-2 RNA is generally detectable in upper respiratory specimens during the acute phase of infection. The lowest concentration of SARS-CoV-2 viral copies this assay can detect is 138 copies/mL. A negative result does not preclude SARS-Cov-2 infection and should not be used as the sole basis for treatment or other patient management decisions. A negative result may occur with  improper specimen collection/handling, submission of specimen other than nasopharyngeal swab, presence of viral mutation(s) within the areas targeted by this assay, and inadequate number of viral copies(<138 copies/mL). A negative  result must be combined with clinical observations, patient history, and epidemiological information. The expected result is Negative.  Fact Sheet for Patients:  EntrepreneurPulse.com.au  Fact Sheet for Healthcare Providers:  IncredibleEmployment.be  This test is no t yet approved or cleared by the [place] FDA and  has been authorized for detection and/or diagnosis of SARS-CoV-2 by FDA under an Emergency Use Authorization (EUA). This EUA will remain  in effect (meaning this test can be used) for the duration of the COVID-19 declaration under Section 564(b)(1) of the Act, 21 U.S.C.section 360bbb-3(b)(1), unless the authorization is terminated  or revoked sooner.       Influenza A by PCR NEGATIVE NEGATIVE Final   Influenza B by PCR NEGATIVE NEGATIVE Final    Comment: (NOTE) The Xpert Xpress  SARS-CoV-2/FLU/RSV plus assay is intended as an aid in the diagnosis of influenza from Nasopharyngeal swab specimens and should not be used as a sole basis for treatment. Nasal washings and aspirates are unacceptable for Xpert Xpress SARS-CoV-2/FLU/RSV testing.  Fact Sheet for Patients: EntrepreneurPulse.com.au  Fact Sheet for Healthcare Providers: IncredibleEmployment.be  This test is not yet approved or cleared by the [place] FDA and has been authorized for detection and/or diagnosis of SARS-CoV-2 by FDA under an Emergency Use Authorization (EUA). This EUA will remain in effect (meaning this test can be used) for the duration of the COVID-19 declaration under Section 564(b)(1) of the Act, 21 U.S.C. section 360bbb-3(b)(1), unless the authorization is terminated or revoked.  Performed at [facility], [place] [address] [place], [place] [address]         Radiology Studies: CT ABDOMEN PELVIS WO CONTRAST  Result Date: 02/02/2021 CLINICAL DATA:  Abdominal pain, acute, nonlocalized EXAM: CT ABDOMEN AND PELVIS WITHOUT CONTRAST TECHNIQUE: Multidetector CT imaging of the abdomen and pelvis was performed following the standard protocol without IV contrast. COMPARISON:  None. FINDINGS: Lower chest: Bilateral lower lobe scarring versus atelectasis. Bronchial wall thickening. Otherwise no acute abnormality. Hepatobiliary: No focal liver abnormality. No gallstones, gallbladder wall thickening, or pericholecystic fluid. No biliary dilatation. Pancreas: Diffusely atrophic. No focal lesion. Otherwise normal pancreatic contour. No surrounding inflammatory changes. No main pancreatic ductal dilatation. Spleen: Normal in size without focal abnormality. Adrenals/Urinary Tract: The adrenal glands are not well visualized.  No definite nodularity. No nephrolithiasis and no hydronephrosis. No definite contour-deforming renal mass. No ureterolithiasis or  hydroureter. The urinary bladder is unremarkable. Stomach/Bowel: Stomach is within normal limits. No evidence of small bowel wall thickening or dilatation. Stool noted throughout the ascending colon and mid transverse colon. 8 cm rectal stool ball. Mild perirectal and proximal colon fat stranding. Possible mild rectosigmoid bowel wall thickening just proximal to the rectal stool ball. Status post appendectomy. Vascular/Lymphatic: No abdominal aorta or iliac aneurysm. Severe atherosclerotic plaque of the aorta and its branches. No abdominal, pelvic, or inguinal lymphadenopathy. Reproductive: Status post hysterectomy. No adnexal masses. Other: No intraperitoneal free fluid. No intraperitoneal free gas. No organized fluid collection. Musculoskeletal: No abdominal wall hernia or abnormality. No suspicious lytic or blastic osseous lesions. No acute displaced fracture. Thoracolumbar levoscoliosis. L4-L5 posterolateral and interbody fusion. Bilateral hip degenerative changes. IMPRESSION: 1. An 8 cm rectal stool ball with query developing cervical or colitis. 2.  Aortic Atherosclerosis (ICD10-I70.0). 3. Please note markedly limited evaluation on this noncontrast study. Electronically Signed   By: Iven Finn M.D.   On: 02/02/2021 18:23   CT Head Wo Contrast  Result Date: 02/02/2021 CLINICAL DATA:  Altered level of consciousness EXAM: CT  HEAD WITHOUT CONTRAST TECHNIQUE: Contiguous axial images were obtained from the base of the skull through the vertex without intravenous contrast. COMPARISON:  07/11/2009 FINDINGS: Brain: No acute infarct or hemorrhage. Lateral ventricles and midline structures are unremarkable. No acute extra-axial fluid collections. No mass effect. Vascular: No hyperdense vessel or unexpected calcification. Skull: Normal. Negative for fracture or focal lesion. Sinuses/Orbits: No acute finding. Other: None. IMPRESSION: 1. No acute intracranial process. Electronically Signed   By: Randa Ngo M.D.    On: 02/02/2021 20:16   DG CHEST PORT 1 VIEW  Result Date: 02/02/2021 CLINICAL DATA:  Sepsis. EXAM: PORTABLE CHEST 1 VIEW COMPARISON:  Chest x-ray 08/19/2019. FINDINGS: The aorta is tortuous. Cardiac silhouette is within normal limits. There is no focal lung consolidation, pleural effusion or pneumothorax. Right shoulder arthroplasty is present. There is stable scoliosis of the thoracolumbar spine. Lumbar fusion hardware is partially visualized. IMPRESSION: No active disease. Electronically Signed   By: Ronney Asters M.D.   On: 02/02/2021 23:45        Scheduled Meds:  docusate sodium  100 mg Oral Daily   enoxaparin (LOVENOX) injection  30 mg Subcutaneous Q24H   levothyroxine  100 mcg Oral QODAY   [START ON 02/04/2021] levothyroxine  112 mcg Oral QODAY   loratadine  10 mg Oral Daily   mometasone-formoterol  2 puff Inhalation BID   pantoprazole  40 mg Oral Daily   polyethylene glycol  17 g Oral Daily   senna  1 tablet Oral Daily   Continuous Infusions:  meropenem (MERREM) IV 1 g (02/03/21 0101)   [START ON 02/04/2021] vancomycin       LOS: 0 days     Cordelia Poche, MD [facility] 02/03/2021, 10:59 AM  If 7PM-7AM, please contact night-coverage [email or web address]

## 2021-02-04 DIAGNOSIS — G9341 Metabolic encephalopathy: Secondary | ICD-10-CM | POA: Diagnosis not present

## 2021-02-04 DIAGNOSIS — A419 Sepsis, unspecified organism: Secondary | ICD-10-CM | POA: Diagnosis not present

## 2021-02-04 DIAGNOSIS — E871 Hypo-osmolality and hyponatremia: Secondary | ICD-10-CM | POA: Diagnosis not present

## 2021-02-04 DIAGNOSIS — R6520 Severe sepsis without septic shock: Secondary | ICD-10-CM | POA: Diagnosis not present

## 2021-02-04 LAB — BASIC METABOLIC PANEL
Anion gap: 7 (ref 5–15)
BUN: 20 mg/dL (ref 8–23)
CO2: 21 mmol/L — ABNORMAL LOW (ref 22–32)
Calcium: 8.5 mg/dL — ABNORMAL LOW (ref 8.9–10.3)
Chloride: 102 mmol/L (ref 98–111)
Creatinine, Ser: 1.07 mg/dL — ABNORMAL HIGH (ref 0.44–1.00)
GFR, Estimated: 51 mL/min — ABNORMAL LOW (ref >60)
Glucose, Bld: 89 mg/dL (ref 70–99)
Potassium: 3.3 mmol/L — ABNORMAL LOW (ref 3.5–5.1)
Sodium: 130 mmol/L — ABNORMAL LOW (ref 135–145)

## 2021-02-04 LAB — MRSA NEXT GEN BY PCR, NASAL: MRSA by PCR Next Gen: NOT DETECTED

## 2021-02-04 LAB — CBC
HCT: 33.2 % — ABNORMAL LOW (ref 36.0–46.0)
Hemoglobin: 10.5 g/dL — ABNORMAL LOW (ref 12.0–15.0)
MCH: 28.9 pg (ref 26.0–34.0)
MCHC: 31.6 g/dL (ref 30.0–36.0)
MCV: 91.5 fL (ref 80.0–100.0)
Platelets: 200 10*3/uL (ref 150–400)
RBC: 3.63 MIL/uL — ABNORMAL LOW (ref 3.87–5.11)
RDW: 14.7 % (ref 11.5–15.5)
WBC: 10.2 10*3/uL (ref 4.0–10.5)
nRBC: 0 % (ref 0.0–0.2)

## 2021-02-04 MED ORDER — VANCOMYCIN HCL 750 MG/150ML IV SOLN
750.0000 mg | INTRAVENOUS | Status: DC
  Administered 2021-02-05 – 2021-02-08 (×3): 750 mg via INTRAVENOUS
  Filled 2021-02-04 (×3): qty 150

## 2021-02-04 MED ORDER — SODIUM CHLORIDE 0.9 % IV SOLN: INTRAVENOUS | Status: AC

## 2021-02-04 MED ORDER — OLOPATADINE HCL 0.1 % OP SOLN
1.0000 [drp] | Freq: Two times a day (BID) | OPHTHALMIC | Status: DC
  Administered 2021-02-04 – 2021-02-06 (×5): 1 [drp] via OPHTHALMIC
  Filled 2021-02-04: qty 5

## 2021-02-04 MED ORDER — CHLORHEXIDINE GLUCONATE CLOTH 2 % EX PADS
6.0000 | MEDICATED_PAD | Freq: Every day | CUTANEOUS | Status: DC
  Administered 2021-02-04 – 2021-02-05 (×2): 6 via TOPICAL

## 2021-02-04 NOTE — Progress Notes (Signed)
I&O catheterization performed with 500 ml of urine obtained. This is third cath done with significant volume output. #14 french, 10 ml balloon foley inserted. Placement confirmed with immediate clear, yellow urine return. Patient tolerated procedure well.  Patient c/o discomfort when she touches her left eye. It is slightly reddened and some scleral edema noted.  Dr. Lonny Prude notified regarding findings.  Ivan Anchors, RN 02/04/21 9:22 AM

## 2021-02-04 NOTE — Plan of Care (Signed)
  Problem: Education: Goal: Knowledge of General Education information will improve Description Including pain rating scale, medication(s)/side effects and non-pharmacologic comfort measures Outcome: Progressing   Problem: Coping: Goal: Level of anxiety will decrease Outcome: Progressing   Problem: Elimination: Goal: Will not experience complications related to urinary retention Outcome: Progressing   Problem: Pain Managment: Goal: General experience of comfort will improve Outcome: Progressing   Problem: Safety: Goal: Ability to remain free from injury will improve Outcome: Progressing   

## 2021-02-04 NOTE — Progress Notes (Addendum)
PROGRESS NOTE    Penny Perez  CHE:[id number] DOB: 1932-03-02 DOA: 02/02/2021 PCP: Chesley Noon, MD   Brief Narrative:  Penny Perez is a 88 y.o. female with a history of fibromyalgia, cervical degenerative disc disease, osteoarthritis, scoliosis, chronic pain on opiate, chronic constipation, CKD stage IIIa, chronic hyponatremia, asthma, allergic rhinitis, hypertension, hypothyroidism, GERD, dementia, hard of hearing. Patient presented secondary to constipation, confusion and multiple falls and was found to have evidence of sepsis secondary to a right lower leg cellulitis. Empiric Vancomycin and Meropenem started.   Assessment & Plan:   Severe sepsis Possible RLE cellulitis Present on admission. Chronic leg wounds. Empiric Vancomycin and meropenem ordered on admission. Blood cultures obtained. Leukocytosis slightly worsened. Afebrile overnight. -Continue vancomycin IV -Follow-up blood cultures  Severe constipation Chronic issue. Failed fleet enema. -Continue home Movantik -Miralax, Colace, Senokot -Dulcolax suppository  Acute on chronic hyponatremia Likely secondary to dehydration. Started on IV fluids with improvement.  Acute metabolic encephalopathy Baseline dementia but possibly worsened secondary to infection. Improving.  Generalized weakness Multiple falls PT recommending SNF.  Multiple skin tears Wound care recommendations: Topical care will be with saline cleansing once daily followed by placement of nonadherent wound contact layer (white petrolatum/Vaseline). This is to be covered with ABD pads and secured with Kerlix roll gauze/paper tape. A sacral prophylactic foam dressing is to be placed  for pressure injury prevention as well as turning from side to side with time in the supine position minimized. Prevalon boots are provided for heel floatation/PI prevention.  Asthma Stable. No exacerbation. -Continue Dulera (substituted for home Symbicort) and  albuterol  Hypothyroidism -Continue Synthroid  GERD -Continue Protonix (substituted for Prilosec)  Chronic pain -Resume home oxycodon  CKD stage IIIa Stable.  Left eye chemosis Likely irritant etiology -olopatadine eye drops   DVT prophylaxis: Lovenox Code Status:   Code Status: Full Code Family Communication: Daughter on telephone. Disposition Plan: Discharge to SNF likely in 1-2 days pending negative cultures and outpatient antibiotic regimen, in addition to stable sodium   Consultants:  None  Procedures:  None  Antimicrobials: Vancomycin Meropenem    Subjective: Some eye discomfort.  Objective: Vitals:   02/04/21 0624 02/04/21 0934 02/04/21 1023 02/04/21 1419  BP: (!) 161/79  (!) 152/65 128/66  Pulse: 73  69 90  Resp: 16  18 18   Temp: 98.1 F (36.7 C)  (!) 97.4 F (36.3 C) 98.2 F (36.8 C)  TempSrc:   Oral Oral  SpO2: 95% 95% 98% 96%  Weight:      Height:        Intake/Output Summary (Last 24 hours) at 02/04/2021 1723 Last data filed at 02/04/2021 1400 Gross per 24 hour  Intake 2860.63 ml  Output 900 ml  Net 1960.63 ml    Filed Weights   02/02/21 2300  Weight: 49.2 kg    Examination:  General exam: Appears calm and comfortable HEENT: left lateral eye with chemosis present Respiratory system: Clear to auscultation. Respiratory effort normal. Cardiovascular system: S1 & S2 heard, RRR. No murmurs, rubs, gallops or clicks. Gastrointestinal system: Abdomen is nondistended, soft and nontender. No organomegaly or masses felt. Normal bowel sounds heard. Central nervous system: Alert and oriented. No focal neurological deficits. Musculoskeletal: No edema. No calf tenderness Skin: No cyanosis. Right anterior leg with significant erythema Psychiatry: Judgement and insight appear normal. Mood & affect appropriate.     Data Reviewed: I have personally reviewed following labs and imaging studies  CBC Lab Results  Component Value Date  WBC 10.2  02/04/2021   RBC 3.63 (L) 02/04/2021   HGB 10.5 (L) 02/04/2021   HCT 33.2 (L) 02/04/2021   MCV 91.5 02/04/2021   MCH 28.9 02/04/2021   PLT 200 02/04/2021   MCHC 31.6 02/04/2021   RDW 14.7 02/04/2021   LYMPHSABS 1.5 02/02/2021   MONOABS 1.2 (H) 02/02/2021   EOSABS 0.1 02/02/2021   BASOSABS 0.0 10/93/2355     Last metabolic panel Lab Results  Component Value Date   NA 130 (L) 02/04/2021   K 3.3 (L) 02/04/2021   CL 102 02/04/2021   CO2 21 (L) 02/04/2021   BUN 20 02/04/2021   CREATININE 1.07 (H) 02/04/2021   GLUCOSE 89 02/04/2021   GFRNONAA 51 (L) 02/04/2021   GFRAA 38 (L) 08/28/2019   CALCIUM 8.5 (L) 02/04/2021   PHOS 2.3 (L) 08/21/2019   PROT 8.1 02/02/2021   ALBUMIN 4.1 02/02/2021   BILITOT 0.7 02/02/2021   ALKPHOS 92 02/02/2021   AST 14 (L) 02/02/2021   ALT 10 02/02/2021   ANIONGAP 7 02/04/2021    CBG (last 3)  No results for input(s): GLUCAP in the last 72 hours.   GFR: Estimated Creatinine Clearance: 27.1 mL/min (A) (by C-G formula based on SCr of 1.07 mg/dL (H)).  Coagulation Profile: No results for input(s): INR, PROTIME in the last 168 hours.  Recent Results (from the past 240 hour(s))  Culture, blood (routine x 2)     Status: None (Preliminary result)   Collection Time: 02/02/21  7:31 PM   Specimen: BLOOD  Result Value Ref Range Status   Specimen Description   Final    BLOOD BLOOD RIGHT FOREARM Performed at [facility] [address] [place] [address], [place], [place] [address]    Special Requests   Final    BOTTLES DRAWN AEROBIC ONLY Blood Culture results may not be optimal due to an inadequate volume of blood received in culture bottles Performed at [facility] [address] [place], [place] [address]    Culture   Final    NO GROWTH 2 DAYS Performed at [facility] [address] [place], [place] [address]    Report Status PENDING  Incomplete  Culture, blood (routine x 2)     Status: None (Preliminary result)    Collection Time: 02/02/21  7:31 PM   Specimen: BLOOD  Result Value Ref Range Status   Specimen Description   Final    BLOOD LEFT ANTECUBITAL Performed at [facility] [address] [place], [place] [address]    Special Requests   Final    BOTTLES DRAWN AEROBIC AND ANAEROBIC Blood Culture adequate volume Performed at [facility] [address] [place], [place] [address]    Culture   Final    NO GROWTH 2 DAYS Performed at [facility] [address], [place], [place] [address]    Report Status PENDING  Incomplete  Resp Panel by RT-PCR (Flu A&B, Covid) Nasopharyngeal Swab     Status: None   Collection Time: 02/02/21  7:33 PM   Specimen: Nasopharyngeal Swab; Nasopharyngeal(NP) swabs in vial transport medium  Result Value Ref Range Status   SARS Coronavirus 2 by RT PCR NEGATIVE NEGATIVE Final    Comment: (NOTE) SARS-CoV-2 target nucleic acids are NOT DETECTED.  The SARS-CoV-2 RNA is generally detectable in upper respiratory specimens during the acute phase of infection. The lowest concentration of SARS-CoV-2 viral copies this assay can detect is 138 copies/mL.  A negative result does not preclude SARS-Cov-2 infection and should not be used as the sole basis for treatment or other patient management decisions. A negative result may occur with  improper specimen collection/handling, submission of specimen other than nasopharyngeal swab, presence of viral mutation(s) within the areas targeted by this assay, and inadequate number of viral copies(<138 copies/mL). A negative result must be combined with clinical observations, patient history, and epidemiological information. The expected result is Negative.  Fact Sheet for Patients:  EntrepreneurPulse.com.au  Fact Sheet for Healthcare Providers:  IncredibleEmployment.be  This test is no t yet approved or cleared by the [place] FDA and  has  been authorized for detection and/or diagnosis of SARS-CoV-2 by FDA under an Emergency Use Authorization (EUA). This EUA will remain  in effect (meaning this test can be used) for the duration of the COVID-19 declaration under Section 564(b)(1) of the Act, 21 U.S.C.section 360bbb-3(b)(1), unless the authorization is terminated  or revoked sooner.       Influenza A by PCR NEGATIVE NEGATIVE Final   Influenza B by PCR NEGATIVE NEGATIVE Final    Comment: (NOTE) The Xpert Xpress SARS-CoV-2/FLU/RSV plus assay is intended as an aid in the diagnosis of influenza from Nasopharyngeal swab specimens and should not be used as a sole basis for treatment. Nasal washings and aspirates are unacceptable for Xpert Xpress SARS-CoV-2/FLU/RSV testing.  Fact Sheet for Patients: EntrepreneurPulse.com.au  Fact Sheet for Healthcare Providers: IncredibleEmployment.be  This test is not yet approved or cleared by the [place] FDA and has been authorized for detection and/or diagnosis of SARS-CoV-2 by FDA under an Emergency Use Authorization (EUA). This EUA will remain in effect (meaning this test can be used) for the duration of the COVID-19 declaration under Section 564(b)(1) of the Act, 21 U.S.C. section 360bbb-3(b)(1), unless the authorization is terminated or revoked.  Performed at [facility], [place] [address] [place], [place] [address]         Radiology Studies: CT ABDOMEN PELVIS WO CONTRAST  Result Date: 02/02/2021 CLINICAL DATA:  Abdominal pain, acute, nonlocalized EXAM: CT ABDOMEN AND PELVIS WITHOUT CONTRAST TECHNIQUE: Multidetector CT imaging of the abdomen and pelvis was performed following the standard protocol without IV contrast. COMPARISON:  None. FINDINGS: Lower chest: Bilateral lower lobe scarring versus atelectasis. Bronchial wall thickening. Otherwise no acute abnormality. Hepatobiliary: No focal liver abnormality. No  gallstones, gallbladder wall thickening, or pericholecystic fluid. No biliary dilatation. Pancreas: Diffusely atrophic. No focal lesion. Otherwise normal pancreatic contour. No surrounding inflammatory changes. No main pancreatic ductal dilatation. Spleen: Normal in size without focal abnormality. Adrenals/Urinary Tract: The adrenal glands are not well visualized.  No definite nodularity. No nephrolithiasis and no hydronephrosis. No definite contour-deforming renal mass. No ureterolithiasis or hydroureter. The urinary bladder is unremarkable. Stomach/Bowel: Stomach is within normal limits. No evidence of small bowel wall thickening or dilatation. Stool noted throughout the ascending colon and mid transverse colon. 8 cm rectal stool ball. Mild perirectal and proximal colon fat stranding. Possible mild rectosigmoid bowel wall thickening just proximal to the rectal stool ball. Status post appendectomy. Vascular/Lymphatic: No abdominal aorta or iliac aneurysm. Severe atherosclerotic plaque of the aorta and its branches. No abdominal, pelvic, or inguinal lymphadenopathy. Reproductive: Status post hysterectomy. No adnexal masses. Other: No intraperitoneal free fluid. No intraperitoneal free gas. No organized fluid collection. Musculoskeletal: No abdominal wall hernia or abnormality. No suspicious lytic or blastic osseous lesions. No acute displaced fracture. Thoracolumbar levoscoliosis. L4-L5 posterolateral and interbody fusion. Bilateral hip degenerative changes. IMPRESSION:  1. An 8 cm rectal stool ball with query developing cervical or colitis. 2.  Aortic Atherosclerosis (ICD10-I70.0). 3. Please note markedly limited evaluation on this noncontrast study. Electronically Signed   By: Iven Finn M.D.   On: 02/02/2021 18:23   CT Head Wo Contrast  Result Date: 02/02/2021 CLINICAL DATA:  Altered level of consciousness EXAM: CT HEAD WITHOUT CONTRAST TECHNIQUE: Contiguous axial images were obtained from the base of the  skull through the vertex without intravenous contrast. COMPARISON:  07/11/2009 FINDINGS: Brain: No acute infarct or hemorrhage. Lateral ventricles and midline structures are unremarkable. No acute extra-axial fluid collections. No mass effect. Vascular: No hyperdense vessel or unexpected calcification. Skull: Normal. Negative for fracture or focal lesion. Sinuses/Orbits: No acute finding. Other: None. IMPRESSION: 1. No acute intracranial process. Electronically Signed   By: Randa Ngo M.D.   On: 02/02/2021 20:16   DG CHEST PORT 1 VIEW  Result Date: 02/02/2021 CLINICAL DATA:  Sepsis. EXAM: PORTABLE CHEST 1 VIEW COMPARISON:  Chest x-ray 08/19/2019. FINDINGS: The aorta is tortuous. Cardiac silhouette is within normal limits. There is no focal lung consolidation, pleural effusion or pneumothorax. Right shoulder arthroplasty is present. There is stable scoliosis of the thoracolumbar spine. Lumbar fusion hardware is partially visualized. IMPRESSION: No active disease. Electronically Signed   By: Ronney Asters M.D.   On: 02/02/2021 23:45        Scheduled Meds:  Chlorhexidine Gluconate Cloth  6 each Topical Daily   docusate sodium  100 mg Oral Daily   enoxaparin (LOVENOX) injection  30 mg Subcutaneous Q24H   levothyroxine  100 mcg Oral QODAY   levothyroxine  112 mcg Oral QODAY   loratadine  10 mg Oral Daily   mometasone-formoterol  2 puff Inhalation BID   pantoprazole  40 mg Oral Daily   polyethylene glycol  17 g Oral Daily   senna  1 tablet Oral Daily   Continuous Infusions:  [START ON 02/05/2021] vancomycin       LOS: 1 day     Cordelia Poche, MD [facility] 02/04/2021, 5:23 PM  If 7PM-7AM, please contact night-coverage [email or web address]

## 2021-02-05 DIAGNOSIS — E871 Hypo-osmolality and hyponatremia: Secondary | ICD-10-CM | POA: Diagnosis not present

## 2021-02-05 DIAGNOSIS — A419 Sepsis, unspecified organism: Secondary | ICD-10-CM | POA: Diagnosis not present

## 2021-02-05 DIAGNOSIS — R6520 Severe sepsis without septic shock: Secondary | ICD-10-CM | POA: Diagnosis not present

## 2021-02-05 DIAGNOSIS — G9341 Metabolic encephalopathy: Secondary | ICD-10-CM | POA: Diagnosis not present

## 2021-02-05 LAB — CREATININE, SERUM
Creatinine, Ser: 1.11 mg/dL — ABNORMAL HIGH (ref 0.44–1.00)
GFR, Estimated: 48 mL/min — ABNORMAL LOW (ref >60)

## 2021-02-05 NOTE — Progress Notes (Signed)
PROGRESS NOTE    Penny Perez  MBT:[id number] DOB: [birth year] DOA: 02/02/2021 PCP: Penny Noon, MD   Brief Narrative:  Penny Perez is a 88 y.o. female with a history of fibromyalgia, cervical degenerative disc disease, osteoarthritis, scoliosis, chronic pain on opiate, chronic constipation, CKD stage IIIa, chronic hyponatremia, asthma, allergic rhinitis, hypertension, hypothyroidism, GERD, dementia, hard of hearing. Patient presented secondary to constipation, confusion and multiple falls and was found to have evidence of sepsis secondary to a right lower leg cellulitis. Empiric Vancomycin and Meropenem started.   Assessment & Plan:   Severe sepsis Possible RLE cellulitis Present on admission. Chronic leg wounds. Empiric Vancomycin and meropenem ordered on admission. Blood cultures obtained. Leukocytosis slightly worsened but now resolved.. Afebrile. -Continue vancomycin IV -Follow-up blood cultures  Severe constipation Chronic issue. Failed fleet enema. Has now had several bowel movements. -Continue home Movantik -Miralax, Colace, Senokot -Dulcolax suppository  Acute on chronic hyponatremia Baseline sodium around 130. Likely secondary to dehydration. Started on IV fluids with improvement.  Acute metabolic encephalopathy Baseline dementia but possibly worsened secondary to infection. Improving.  Generalized weakness Multiple falls PT recommending SNF.  Multiple skin tears Wound care recommendations: Topical care will be with saline cleansing once daily followed by placement of nonadherent wound contact layer (white petrolatum/Vaseline). This is to be covered with ABD pads and secured with Kerlix roll gauze/paper tape. A sacral prophylactic foam dressing is to be placed  for pressure injury prevention as well as turning from side to side with time in the supine position minimized. Prevalon boots are provided for heel floatation/PI prevention.  Asthma Stable.  No exacerbation. -Continue Dulera (substituted for home Symbicort) and albuterol  Hypothyroidism -Continue Synthroid  GERD -Continue Protonix (substituted for Prilosec)  Chronic pain -Continue home oxycodon  CKD stage IIIa Stable.  Left eye chemosis Likely irritant etiology. Improved today but still with symptoms. -olopatadine eye drops   DVT prophylaxis: Lovenox Code Status:   Code Status: Full Code Family Communication: None at bedside. Disposition Plan: Discharge to SNF likely in 1-2 days pending negative cultures and outpatient antibiotic regimen, in addition to stable sodium   Consultants:  None  Procedures:  None  Antimicrobials: Vancomycin Meropenem    Subjective: Continues to have some eye discomfort. Also has some issues with an ingrown toenail on her left foot.  Objective: Vitals:   02/05/21 0545 02/05/21 0911 02/05/21 1009 02/05/21 1510  BP: (!) 178/99  (!) 141/81 (!) 131/93  Pulse: 76  98 84  Resp: 16  16 15   Temp: 97.8 F (36.6 C)  98.2 F (36.8 C) 98.5 F (36.9 C)  TempSrc: Oral  Oral Oral  SpO2: 97% 95% 97% 98%  Weight:      Height:        Intake/Output Summary (Last 24 hours) at 02/05/2021 1511 Last data filed at 02/05/2021 1400 Gross per 24 hour  Intake 2239.89 ml  Output 470 ml  Net 1769.89 ml    Filed Weights   02/02/21 2300  Weight: 49.2 kg    Examination:  General exam: Appears calm and comfortable HEENT: chemosis of left eye is resolved Respiratory system: Clear to auscultation. Respiratory effort normal. Cardiovascular system: S1 & S2 heard, RRR. Gastrointestinal system: Abdomen is nondistended, soft and nontender. No organomegaly or masses felt. Normal bowel sounds heard. Central nervous system: Alert and oriented. No focal neurological deficits. Musculoskeletal: No edema. No calf tenderness Skin: No cyanosis. Lesions in gauze bandages. Left great toe with ingrown toenail noted and no  surrounding erythema or  swelling Psychiatry: Judgement and insight appear normal. Mood & affect appropriate.    Data Reviewed: I have personally reviewed following labs and imaging studies  CBC Lab Results  Component Value Date   WBC 10.2 02/04/2021   RBC 3.63 (L) 02/04/2021   HGB 10.5 (L) 02/04/2021   HCT 33.2 (L) 02/04/2021   MCV 91.5 02/04/2021   MCH 28.9 02/04/2021   PLT 200 02/04/2021   MCHC 31.6 02/04/2021   RDW 14.7 02/04/2021   LYMPHSABS 1.5 02/02/2021   MONOABS 1.2 (H) 02/02/2021   EOSABS 0.1 02/02/2021   BASOSABS 0.0 37/16/9678     Last metabolic panel Lab Results  Component Value Date   NA 130 (L) 02/04/2021   K 3.3 (L) 02/04/2021   CL 102 02/04/2021   CO2 21 (L) 02/04/2021   BUN 20 02/04/2021   CREATININE 1.11 (H) 02/05/2021   GLUCOSE 89 02/04/2021   GFRNONAA 48 (L) 02/05/2021   GFRAA 38 (L) 08/28/2019   CALCIUM 8.5 (L) 02/04/2021   PHOS 2.3 (L) 08/21/2019   PROT 8.1 02/02/2021   ALBUMIN 4.1 02/02/2021   BILITOT 0.7 02/02/2021   ALKPHOS 92 02/02/2021   AST 14 (L) 02/02/2021   ALT 10 02/02/2021   ANIONGAP 7 02/04/2021    CBG (last 3)  No results for input(s): GLUCAP in the last 72 hours.   GFR: Estimated Creatinine Clearance: 26.1 mL/min (A) (by C-G formula based on SCr of 1.11 mg/dL (H)).  Coagulation Profile: No results for input(s): INR, PROTIME in the last 168 hours.  Recent Results (from the past 240 hour(s))  Culture, blood (routine x 2)     Status: None (Preliminary result)   Collection Time: 02/02/21  7:31 PM   Specimen: BLOOD  Result Value Ref Range Status   Specimen Description   Final    BLOOD BLOOD RIGHT FOREARM Performed at [facility] [address] [place], [place] [address]    Special Requests   Final    BOTTLES DRAWN AEROBIC ONLY Blood Culture results may not be optimal due to an inadequate volume of blood received in culture bottles Performed at [facility] [address] [place], [place] [address]     Culture   Final    NO GROWTH 3 DAYS Performed at [facility], [place] [address] [place], [place] [address]    Report Status PENDING  Incomplete  Culture, blood (routine x 2)     Status: None (Preliminary result)   Collection Time: 02/02/21  7:31 PM   Specimen: BLOOD  Result Value Ref Range Status   Specimen Description   Final    BLOOD LEFT ANTECUBITAL Performed at [facility] [address], [place], [place] [address]    Special Requests   Final    BOTTLES DRAWN AEROBIC AND ANAEROBIC Blood Culture adequate volume Performed at [facility] [address] [place], [place] [address]    Culture   Final    NO GROWTH 3 DAYS Performed at [facility], [place] [address] [place], [place] [address]    Report Status PENDING  Incomplete  Resp Panel by RT-PCR (Flu A&B, Covid) Nasopharyngeal Swab     Status: None   Collection Time: 02/02/21  7:33 PM   Specimen: Nasopharyngeal Swab; Nasopharyngeal(NP) swabs in vial transport medium  Result Value Ref Range Status   SARS Coronavirus 2 by RT PCR NEGATIVE NEGATIVE Final    Comment: (NOTE)  SARS-CoV-2 target nucleic acids are NOT DETECTED.  The SARS-CoV-2 RNA is generally detectable in upper respiratory specimens during the acute phase of infection. The lowest concentration of SARS-CoV-2 viral copies this assay can detect is 138 copies/mL. A negative result does not preclude SARS-Cov-2 infection and should not be used as the sole basis for treatment or other patient management decisions. A negative result may occur with  improper specimen collection/handling, submission of specimen other than nasopharyngeal swab, presence of viral mutation(s) within the areas targeted by this assay, and inadequate number of viral copies(<138 copies/mL). A negative result must be combined with clinical observations, patient history, and epidemiological information. The expected result is Negative.  Fact  Sheet for Patients:  EntrepreneurPulse.com.au  Fact Sheet for Healthcare Providers:  IncredibleEmployment.be  This test is no t yet approved or cleared by the [place] FDA and  has been authorized for detection and/or diagnosis of SARS-CoV-2 by FDA under an Emergency Use Authorization (EUA). This EUA will remain  in effect (meaning this test can be used) for the duration of the COVID-19 declaration under Section 564(b)(1) of the Act, 21 U.S.C.section 360bbb-3(b)(1), unless the authorization is terminated  or revoked sooner.       Influenza A by PCR NEGATIVE NEGATIVE Final   Influenza B by PCR NEGATIVE NEGATIVE Final    Comment: (NOTE) The Xpert Xpress SARS-CoV-2/FLU/RSV plus assay is intended as an aid in the diagnosis of influenza from Nasopharyngeal swab specimens and should not be used as a sole basis for treatment. Nasal washings and aspirates are unacceptable for Xpert Xpress SARS-CoV-2/FLU/RSV testing.  Fact Sheet for Patients: EntrepreneurPulse.com.au  Fact Sheet for Healthcare Providers: IncredibleEmployment.be  This test is not yet approved or cleared by the [place] FDA and has been authorized for detection and/or diagnosis of SARS-CoV-2 by FDA under an Emergency Use Authorization (EUA). This EUA will remain in effect (meaning this test can be used) for the duration of the COVID-19 declaration under Section 564(b)(1) of the Act, 21 U.S.C. section 360bbb-3(b)(1), unless the authorization is terminated or revoked.  Performed at [facility], [place] [address], [place], [place] [address]   MRSA Next Gen by PCR, Nasal     Status: None   Collection Time: 02/04/21  8:08 PM   Specimen: Nasal Mucosa; Nasal Swab  Result Value Ref Range Status   MRSA by PCR Next Gen NOT DETECTED NOT DETECTED Final    Comment: (NOTE) The GeneXpert MRSA Assay (FDA approved for NASAL specimens  only), is one component of a comprehensive MRSA colonization surveillance program. It is not intended to diagnose MRSA infection nor to guide or monitor treatment for MRSA infections. Test performance is not FDA approved in patients less than 69 years old. Performed at [facility], [place] [address] [place], [place] [address]         Radiology Studies: No results found.      Scheduled Meds:  docusate sodium  100 mg Oral Daily   enoxaparin (LOVENOX) injection  30 mg Subcutaneous Q24H   levothyroxine  100 mcg Oral QODAY   levothyroxine  112 mcg Oral QODAY   loratadine  10 mg Oral Daily   mometasone-formoterol  2 puff Inhalation BID   olopatadine  1 drop Left Eye BID   pantoprazole  40 mg Oral Daily   polyethylene glycol  17 g Oral Daily   senna  1 tablet Oral Daily   Continuous Infusions:  vancomycin  LOS: 2 days     Cordelia Poche, MD [facility] 02/05/2021, 3:11 PM  If 7PM-7AM, please contact night-coverage [email or web address]

## 2021-02-05 NOTE — Plan of Care (Signed)
Pt complained of itching when given CHG wipes. Order discontinued, MD updated.  Dressing to all extremities changed per order. Sacral foam dressing changed, skin underneath intact.   Problem: Education: Goal: Knowledge of General Education information will improve Description: Including pain rating scale, medication(s)/side effects and non-pharmacologic comfort measures Outcome: Progressing   Problem: Health Behavior/Discharge Planning: Goal: Ability to manage health-related needs will improve Outcome: Progressing   Problem: Clinical Measurements: Goal: Ability to maintain clinical measurements within normal limits will improve Outcome: Progressing   Problem: Activity: Goal: Risk for activity intolerance will decrease Outcome: Progressing   Problem: Nutrition: Goal: Adequate nutrition will be maintained Outcome: Progressing   Problem: Coping: Goal: Level of anxiety will decrease Outcome: Progressing   Problem: Elimination: Goal: Will not experience complications related to bowel motility Outcome: Progressing   Problem: Pain Managment: Goal: General experience of comfort will improve Outcome: Progressing   Problem: Safety: Goal: Ability to remain free from injury will improve Outcome: Progressing   Problem: Skin Integrity: Goal: Risk for impaired skin integrity will decrease Outcome: Progressing

## 2021-02-06 DIAGNOSIS — A419 Sepsis, unspecified organism: Secondary | ICD-10-CM | POA: Diagnosis not present

## 2021-02-06 DIAGNOSIS — E871 Hypo-osmolality and hyponatremia: Secondary | ICD-10-CM | POA: Diagnosis not present

## 2021-02-06 DIAGNOSIS — G9341 Metabolic encephalopathy: Secondary | ICD-10-CM | POA: Diagnosis not present

## 2021-02-06 DIAGNOSIS — R6520 Severe sepsis without septic shock: Secondary | ICD-10-CM | POA: Diagnosis not present

## 2021-02-06 LAB — BASIC METABOLIC PANEL
Anion gap: 6 (ref 5–15)
BUN: 14 mg/dL (ref 8–23)
CO2: 24 mmol/L (ref 22–32)
Calcium: 8.7 mg/dL — ABNORMAL LOW (ref 8.9–10.3)
Chloride: 102 mmol/L (ref 98–111)
Creatinine, Ser: 1.07 mg/dL — ABNORMAL HIGH (ref 0.44–1.00)
GFR, Estimated: 51 mL/min — ABNORMAL LOW (ref >60)
Glucose, Bld: 90 mg/dL (ref 70–99)
Potassium: 3.9 mmol/L (ref 3.5–5.1)
Sodium: 132 mmol/L — ABNORMAL LOW (ref 135–145)

## 2021-02-06 MED ORDER — BISACODYL 10 MG RE SUPP
10.0000 mg | Freq: Once | RECTAL | Status: AC
  Administered 2021-02-06: 10 mg via RECTAL
  Filled 2021-02-06: qty 1

## 2021-02-06 NOTE — Progress Notes (Signed)
Pharmacy Antibiotic Note  Penny Perez is a 88 y.o. female admitted on 02/02/2021 with left lower leg cellulitis and sepsis.  Pharmacy has been consulted for Vancomycin and Meropenem dosing.  Patient received Vancomycin 1gm IV x 1 dose in the ED.  Plan: Vancomycin 750 mg IV Q 36 hrs. Goal AUC 400-550.  Expected AUC: 524,  SCr used: 1.07 Follow renal function, duration   Height: 5' (152.4 cm) Weight: 49.2 kg (108 lb 7.5 oz) IBW/kg (Calculated) : 45.5  Temp (24hrs), Avg:98.3 F (36.8 C), Min:98 F (36.7 C), Max:98.6 F (37 C)  Recent Labs  Lab 02/02/21 1722 02/02/21 1931 02/02/21 2127 02/02/21 2311 02/03/21 0036 02/04/21 0309 02/05/21 0306 02/06/21 0330  WBC 12.9*  --   --   --  14.9* 10.2  --   --   CREATININE 1.12*  --   --  1.04* 1.10* 1.07* 1.11* 1.07*  LATICACIDVEN  --  1.0 3.9*  --  1.0  --   --   --      Estimated Creatinine Clearance: 27.1 mL/min (A) (by C-G formula based on SCr of 1.07 mg/dL (H)).    Allergies  Allergen Reactions   Linezolid Nausea And Vomiting   Sulfa Antibiotics     Other reaction(s): Other Abdominal pain and bloating Abdominal pain and bloating   Ampicillin Other (See Comments)    Ulcers in mouth,stomach upset   Cephalexin Other (See Comments)    Mouth ulcer   Chlorhexidine Itching   Clarithromycin Other (See Comments)    Mouth ulcer   Codeine Other (See Comments)    unknown   Doxycycline Other (See Comments)    Mouth ulcer   Erythromycin Other (See Comments)    Mouth ulcer   Gatifloxacin Other (See Comments)    Mouth ulcer   Hydrocodone Other (See Comments)    unknown   Penicillins Other (See Comments)    Mouth ulcer   Latex Rash   Tape Rash    Antimicrobials this admission: 1/5/ Vancomycin >>   1/6 Meropenem >>1/6    Dose adjustments this admission:    Microbiology results: 1/5 BCx:   ngtd 1/7 MRSA PCR: not detected  Thank you for allowing pharmacy to be a part of this patients care.  Gretta Arab  PharmD, BCPS Clinical Pharmacist [facility] main pharmacy [phone] 02/06/2021 12:50 PM

## 2021-02-06 NOTE — Plan of Care (Signed)

## 2021-02-06 NOTE — Progress Notes (Signed)
PROGRESS NOTE    [name]  BSW:[id number] DOB: 1932-10-23 DOA: 02/02/2021 PCP: Chesley Noon, MD   Brief Narrative:  Penny Perez is a 88 y.o. female with a history of fibromyalgia, cervical degenerative disc disease, osteoarthritis, scoliosis, chronic pain on opiate, chronic constipation, CKD stage IIIa, chronic hyponatremia, asthma, allergic rhinitis, hypertension, hypothyroidism, GERD, dementia, hard of hearing. Patient presented secondary to constipation, confusion and multiple falls and was found to have evidence of sepsis secondary to a right lower leg cellulitis. Empiric Vancomycin and Meropenem started.   Assessment & Plan:   Severe sepsis Possible RLE cellulitis Present on admission. Chronic leg wounds. Empiric Vancomycin and meropenem ordered on admission. Blood cultures obtained. Leukocytosis slightly worsened initially but now resolved.. Afebrile. -Continue vancomycin IV x 7 days -Follow-up blood cultures  Severe constipation Chronic issue. Failed fleet enema. Has now had several bowel movements. Abdominal distension increased today. -Continue home Movantik -Miralax, Colace, Senokot -Dulcolax suppository today  Acute on chronic hyponatremia Baseline sodium around 130. Likely secondary to dehydration. Started on IV fluids with improvement.  Acute metabolic encephalopathy Baseline dementia but possibly worsened secondary to infection. Appears to be resolved.  Generalized weakness Multiple falls PT recommending SNF.  Multiple skin tears Wound care recommendations: Topical care will be with saline cleansing once daily followed by placement of nonadherent wound contact layer (white petrolatum/Vaseline). This is to be covered with ABD pads and secured with Kerlix roll gauze/paper tape. A sacral prophylactic foam dressing is to be placed  for pressure injury prevention as well as turning from side to side with time in the supine position minimized.  Prevalon boots are provided for heel floatation/PI prevention.  Asthma Stable. No exacerbation. -Continue Dulera (substituted for home Symbicort) and albuterol  Hypothyroidism -Continue Synthroid  GERD -Continue Protonix (substituted for Prilosec)  Chronic pain -Continue home oxycodon  CKD stage IIIa Stable.  Left eye chemosis Likely irritant etiology. Improved. -olopatadine eye drops x3-5 days   DVT prophylaxis: Lovenox Code Status:   Code Status: Full Code Family Communication: None at bedside. Disposition Plan: Discharge to SNF likely in 2 days pending negative cultures and completion of antibiotics   Consultants:  None  Procedures:  None  Antimicrobials: Vancomycin IV Meropenem IV   Subjective: No issues overnight. Small bowel movements.  Objective: Vitals:   02/05/21 1510 02/05/21 2028 02/05/21 2127 02/06/21 0412  BP: (!) 131/93 (!) 150/71  119/77  Pulse: 84 81  75  Resp: 15 16  16   Temp: 98.5 F (36.9 C) 98.2 F (36.8 C)  98.6 F (37 C)  TempSrc: Oral Oral  Oral  SpO2: 98% 98% 98% 96%  Weight:      Height:        Intake/Output Summary (Last 24 hours) at 02/06/2021 0849 Last data filed at 02/06/2021 0600 Gross per 24 hour  Intake 1290.73 ml  Output 1150 ml  Net 140.73 ml    Filed Weights   02/02/21 2300  Weight: 49.2 kg    Examination:  General exam: Appears calm and comfortable Respiratory system: Clear to auscultation. Respiratory effort normal. Cardiovascular system: S1 & S2 heard, RRR. Systolic murmur Gastrointestinal system: Abdomen is nondistended, soft and nontender. No organomegaly or masses felt. Normal bowel sounds heard. Central nervous system: Alert and oriented. No focal neurological deficits. Musculoskeletal: Right LE with erythema and ulcers noted Skin: No cyanosis. No rashes Psychiatry: Judgement and insight appear normal. Mood & affect appropriate.    Data Reviewed: I have personally reviewed following labs and  imaging studies  CBC Lab Results  Component Value Date   WBC 10.2 02/04/2021   RBC 3.63 (L) 02/04/2021   HGB 10.5 (L) 02/04/2021   HCT 33.2 (L) 02/04/2021   MCV 91.5 02/04/2021   MCH 28.9 02/04/2021   PLT 200 02/04/2021   MCHC 31.6 02/04/2021   RDW 14.7 02/04/2021   LYMPHSABS 1.5 02/02/2021   MONOABS 1.2 (H) 02/02/2021   EOSABS 0.1 02/02/2021   BASOSABS 0.0 32/20/2542     Last metabolic panel Lab Results  Component Value Date   NA 132 (L) 02/06/2021   K 3.9 02/06/2021   CL 102 02/06/2021   CO2 24 02/06/2021   BUN 14 02/06/2021   CREATININE 1.07 (H) 02/06/2021   GLUCOSE 90 02/06/2021   GFRNONAA 51 (L) 02/06/2021   GFRAA 38 (L) 08/28/2019   CALCIUM 8.7 (L) 02/06/2021   PHOS 2.3 (L) 08/21/2019   PROT 8.1 02/02/2021   ALBUMIN 4.1 02/02/2021   BILITOT 0.7 02/02/2021   ALKPHOS 92 02/02/2021   AST 14 (L) 02/02/2021   ALT 10 02/02/2021   ANIONGAP 6 02/06/2021    CBG (last 3)  No results for input(s): GLUCAP in the last 72 hours.   GFR: Estimated Creatinine Clearance: 27.1 mL/min (A) (by C-G formula based on SCr of 1.07 mg/dL (H)).  Coagulation Profile: No results for input(s): INR, PROTIME in the last 168 hours.  Recent Results (from the past 240 hour(s))  Culture, blood (routine x 2)     Status: None (Preliminary result)   Collection Time: 02/02/21  7:31 PM   Specimen: BLOOD  Result Value Ref Range Status   Specimen Description   Final    BLOOD BLOOD RIGHT FOREARM Performed at [facility] [address], [place], [place] [address]    Special Requests   Final    BOTTLES DRAWN AEROBIC ONLY Blood Culture results may not be optimal due to an inadequate volume of blood received in culture bottles Performed at [facility] [address] [place], [place] [address]    Culture   Final    NO GROWTH 3 DAYS Performed at [facility], [place] [address] [place], [place] [address]    Report Status PENDING  Incomplete   Culture, blood (routine x 2)     Status: None (Preliminary result)   Collection Time: 02/02/21  7:31 PM   Specimen: BLOOD  Result Value Ref Range Status   Specimen Description   Final    BLOOD LEFT ANTECUBITAL Performed at [facility] [address] [place], [place] [address]    Special Requests   Final    BOTTLES DRAWN AEROBIC AND ANAEROBIC Blood Culture adequate volume Performed at [facility] [address], [place], [place] [address]    Culture   Final    NO GROWTH 3 DAYS Performed at [facility], [place] [address], [place], [place] [address]    Report Status PENDING  Incomplete  Resp Panel by RT-PCR (Flu A&B, Covid) Nasopharyngeal Swab     Status: None   Collection Time: 02/02/21  7:33 PM   Specimen: Nasopharyngeal Swab; Nasopharyngeal(NP) swabs in vial transport medium  Result Value Ref Range Status   SARS Coronavirus 2 by RT PCR NEGATIVE NEGATIVE Final    Comment: (NOTE) SARS-CoV-2 target nucleic acids are NOT DETECTED.  The SARS-CoV-2 RNA is generally detectable in upper respiratory specimens during the acute phase of infection. The lowest concentration of SARS-CoV-2  viral copies this assay can detect is 138 copies/mL. A negative result does not preclude SARS-Cov-2 infection and should not be used as the sole basis for treatment or other patient management decisions. A negative result may occur with  improper specimen collection/handling, submission of specimen other than nasopharyngeal swab, presence of viral mutation(s) within the areas targeted by this assay, and inadequate number of viral copies(<138 copies/mL). A negative result must be combined with clinical observations, patient history, and epidemiological information. The expected result is Negative.  Fact Sheet for Patients:  EntrepreneurPulse.com.au  Fact Sheet for Healthcare Providers:  IncredibleEmployment.be  This test  is no t yet approved or cleared by the [place] FDA and  has been authorized for detection and/or diagnosis of SARS-CoV-2 by FDA under an Emergency Use Authorization (EUA). This EUA will remain  in effect (meaning this test can be used) for the duration of the COVID-19 declaration under Section 564(b)(1) of the Act, 21 U.S.C.section 360bbb-3(b)(1), unless the authorization is terminated  or revoked sooner.       Influenza A by PCR NEGATIVE NEGATIVE Final   Influenza B by PCR NEGATIVE NEGATIVE Final    Comment: (NOTE) The Xpert Xpress SARS-CoV-2/FLU/RSV plus assay is intended as an aid in the diagnosis of influenza from Nasopharyngeal swab specimens and should not be used as a sole basis for treatment. Nasal washings and aspirates are unacceptable for Xpert Xpress SARS-CoV-2/FLU/RSV testing.  Fact Sheet for Patients: EntrepreneurPulse.com.au  Fact Sheet for Healthcare Providers: IncredibleEmployment.be  This test is not yet approved or cleared by the [place] FDA and has been authorized for detection and/or diagnosis of SARS-CoV-2 by FDA under an Emergency Use Authorization (EUA). This EUA will remain in effect (meaning this test can be used) for the duration of the COVID-19 declaration under Section 564(b)(1) of the Act, 21 U.S.C. section 360bbb-3(b)(1), unless the authorization is terminated or revoked.  Performed at [facility], [place] [address] [place], [place] [address]   MRSA Next Gen by PCR, Nasal     Status: None   Collection Time: 02/04/21  8:08 PM   Specimen: Nasal Mucosa; Nasal Swab  Result Value Ref Range Status   MRSA by PCR Next Gen NOT DETECTED NOT DETECTED Final    Comment: (NOTE) The GeneXpert MRSA Assay (FDA approved for NASAL specimens only), is one component of a comprehensive MRSA colonization surveillance program. It is not intended to diagnose MRSA infection nor to guide or monitor  treatment for MRSA infections. Test performance is not FDA approved in patients less than 29 years old. Performed at [facility], [place] [address], [place], [place] [address]         Radiology Studies: No results found.      Scheduled Meds:  docusate sodium  100 mg Oral Daily   enoxaparin (LOVENOX) injection  30 mg Subcutaneous Q24H   levothyroxine  100 mcg Oral QODAY   levothyroxine  112 mcg Oral QODAY   loratadine  10 mg Oral Daily   mometasone-formoterol  2 puff Inhalation BID   olopatadine  1 drop Left Eye BID   pantoprazole  40 mg Oral Daily   polyethylene glycol  17 g Oral Daily   senna  1 tablet Oral Daily   Continuous Infusions:  vancomycin 750 mg (02/05/21 1954)     LOS: 3 days     Cordelia Poche, MD [facility] 02/06/2021, 8:49 AM  If 7PM-7AM, please contact night-coverage [email or web address]

## 2021-02-07 DIAGNOSIS — R6520 Severe sepsis without septic shock: Secondary | ICD-10-CM | POA: Diagnosis not present

## 2021-02-07 DIAGNOSIS — E871 Hypo-osmolality and hyponatremia: Secondary | ICD-10-CM | POA: Diagnosis not present

## 2021-02-07 DIAGNOSIS — G9341 Metabolic encephalopathy: Secondary | ICD-10-CM | POA: Diagnosis not present

## 2021-02-07 DIAGNOSIS — A419 Sepsis, unspecified organism: Secondary | ICD-10-CM | POA: Diagnosis not present

## 2021-02-07 LAB — CBC
HCT: 39.5 % (ref 36.0–46.0)
Hemoglobin: 12.5 g/dL (ref 12.0–15.0)
MCH: 29 pg (ref 26.0–34.0)
MCHC: 31.6 g/dL (ref 30.0–36.0)
MCV: 91.6 fL (ref 80.0–100.0)
Platelets: 229 10*3/uL (ref 150–400)
RBC: 4.31 MIL/uL (ref 3.87–5.11)
RDW: 14.5 % (ref 11.5–15.5)
WBC: 8.2 10*3/uL (ref 4.0–10.5)
nRBC: 0 % (ref 0.0–0.2)

## 2021-02-07 LAB — C-REACTIVE PROTEIN: CRP: 3.9 mg/dL — ABNORMAL HIGH (ref <1.0)

## 2021-02-07 LAB — CULTURE, BLOOD (ROUTINE X 2)
Culture: NO GROWTH
Culture: NO GROWTH
Special Requests: ADEQUATE

## 2021-02-07 LAB — SEDIMENTATION RATE: Sed Rate: 15 mm/hr (ref 0–22)

## 2021-02-07 NOTE — Care Management Important Message (Signed)
Important Message  Patient Details IM Letter placed in Patients room. Name: Penny Perez MRN: [id number] Date of Birth: 05/09/1932   Medicare Important Message Given:  Yes     Kerin Salen 02/07/2021, 11:52 AM

## 2021-02-07 NOTE — Progress Notes (Signed)
° °PROGRESS NOTE ° ° ° °Penny Perez  MRN:[id number] DOB: 04/26/1932 DOA: 02/02/2021 °PCP: Badger, Michael C, MD ° ° °Brief Narrative: ° °Penny Perez is a 88 y.o. female with a history of fibromyalgia, cervical degenerative disc disease, osteoarthritis, scoliosis, chronic pain on opiate, chronic constipation, CKD stage IIIa, chronic hyponatremia, asthma, allergic rhinitis, hypertension, hypothyroidism, GERD, dementia, hard of hearing. Patient presented secondary to constipation, confusion and multiple falls and was found to have evidence of sepsis secondary to a right lower leg cellulitis. Empiric Vancomycin and Meropenem started. ° ° °Assessment & Plan: °  °Severe sepsis °Possible RLE cellulitis °Present on admission. Chronic leg wounds. Empiric Vancomycin and meropenem ordered on admission. Blood cultures obtained and are no growth to date. Leukocytosis slightly worsened initially but now resolved.. Afebrile. °-Continue vancomycin IV x 7 days °-CRP, ESR, CBC ° °Severe constipation °Chronic issue. Initially failed fleet enema. Has now had several bowel movements. Abdominal distension increased today. °-Continue home Movantik °-Scheduled Miralax, Colace, Senokot ° °Acute on chronic hyponatremia °Baseline sodium around 130. Likely secondary to dehydration. Started on IV fluids with improvement. ° °Acute metabolic encephalopathy °Baseline dementia but possibly worsened secondary to infection. Appears to be resolved. ° °Generalized weakness °Multiple falls °PT recommending SNF. ° °Multiple skin tears °Wound care recommendations: °Topical care will be with saline cleansing once daily followed by placement of nonadherent wound contact layer (white petrolatum/Vaseline). This is to be covered with ABD pads and secured with Kerlix roll gauze/paper tape. A sacral prophylactic foam dressing is to be placed  for pressure injury prevention as well as turning from side to side with time in the supine position minimized.  Prevalon boots are provided for heel floatation/PI prevention. ° °Asthma °Stable. No exacerbation. °-Continue Dulera (substituted for home Symbicort) and albuterol ° °Hypothyroidism °-Continue Synthroid ° °GERD °-Continue Protonix (substituted for Prilosec) ° °Chronic pain °-Continue home oxycodon ° °CKD stage IIIa °Stable. ° °Left eye chemosis °Likely irritant etiology. Improved. °-Discontinue olopatadine eye drops ° ° °DVT prophylaxis: Lovenox °Code Status:   Code Status: Full Code °Family Communication: None at bedside. °Disposition Plan: Discharge to SNF likely in 2 days pending negative cultures and completion of antibiotics ° ° °Consultants:  °None ° °Procedures:  °None ° °Antimicrobials: °Vancomycin IV °Meropenem IV ° ° °Subjective: °No issues overnight. Small bowel movements. ° °Objective: °Vitals:  ° 02/06/21 2015 02/06/21 2030 02/07/21 0632 02/07/21 0749  °BP:  (!) 158/58 (!) 153/77   °Pulse:  84 82   °Resp:  18 15   °Temp:  99.1 °F (37.3 °C) 98.1 °F (36.7 °C)   °TempSrc:  Oral    °SpO2: 95% 98% 96% 96%  °Weight:      °Height:      ° ° °Intake/Output Summary (Last 24 hours) at 02/07/2021 1031 °Last data filed at 02/07/2021 0630 °Gross per 24 hour  °Intake 600 ml  °Output 2225 ml  °Net -1625 ml  ° ° °Filed Weights  ° 02/02/21 2300  °Weight: 49.2 kg  ° ° °Examination: ° °General exam: Appears calm and comfortable °Respiratory system: Clear to auscultation. Respiratory effort normal. °Cardiovascular system: S1 & S2 heard, RRR. Systolic murmur °Gastrointestinal system: Abdomen is nondistended, soft and nontender. No organomegaly or masses felt. Normal bowel sounds heard. °Central nervous system: Alert and oriented. No focal neurological deficits. °Musculoskeletal: No edema. No calf tenderness °Skin: No cyanosis. Right LE wrapped in dressing gauze °Psychiatry: Judgement and insight appear normal. Mood & affect appropriate.  ° ° °Data Reviewed: I have personally reviewed   reviewed following labs and imaging  studies  CBC Lab Results  Component Value Date   WBC 10.2 02/04/2021   RBC 3.63 (L) 02/04/2021   HGB 10.5 (L) 02/04/2021   HCT 33.2 (L) 02/04/2021   MCV 91.5 02/04/2021   MCH 28.9 02/04/2021   PLT 200 02/04/2021   MCHC 31.6 02/04/2021   RDW 14.7 02/04/2021   LYMPHSABS 1.5 02/02/2021   MONOABS 1.2 (H) 02/02/2021   EOSABS 0.1 02/02/2021   BASOSABS 0.0 82/95/6213     Last metabolic panel Lab Results  Component Value Date   NA 132 (L) 02/06/2021   K 3.9 02/06/2021   CL 102 02/06/2021   CO2 24 02/06/2021   BUN 14 02/06/2021   CREATININE 1.07 (H) 02/06/2021   GLUCOSE 90 02/06/2021   GFRNONAA 51 (L) 02/06/2021   GFRAA 38 (L) 08/28/2019   CALCIUM 8.7 (L) 02/06/2021   PHOS 2.3 (L) 08/21/2019   PROT 8.1 02/02/2021   ALBUMIN 4.1 02/02/2021   BILITOT 0.7 02/02/2021   ALKPHOS 92 02/02/2021   AST 14 (L) 02/02/2021   ALT 10 02/02/2021   ANIONGAP 6 02/06/2021    CBG (last 3)  No results for input(s): GLUCAP in the last 72 hours.   GFR: Estimated Creatinine Clearance: 27.1 mL/min (A) (by C-G formula based on SCr of 1.07 mg/dL (H)).  Coagulation Profile: No results for input(s): INR, PROTIME in the last 168 hours.  Recent Results (from the past 240 hour(s))  Culture, blood (routine x 2)     Status: None   Collection Time: 02/02/21  7:31 PM   Specimen: BLOOD  Result Value Ref Range Status   Specimen Description   Final    BLOOD BLOOD RIGHT FOREARM Performed at [facility] [address] [place], [place] [address]    Special Requests   Final    BOTTLES DRAWN AEROBIC ONLY Blood Culture results may not be optimal due to an inadequate volume of blood received in culture bottles Performed at [facility] [address] [place], [place] [address]    Culture   Final    NO GROWTH 5 DAYS Performed at [facility], [place] [address] [place], [place] [address]    Report Status 02/07/2021 FINAL  Final  Culture, blood (routine x 2)      Status: None   Collection Time: 02/02/21  7:31 PM   Specimen: BLOOD  Result Value Ref Range Status   Specimen Description   Final    BLOOD LEFT ANTECUBITAL Performed at [facility] [address] [place], [place] [address]    Special Requests   Final    BOTTLES DRAWN AEROBIC AND ANAEROBIC Blood Culture adequate volume Performed at [facility] [address] [place], [place] [address]    Culture   Final    NO GROWTH 5 DAYS Performed at [facility], [place] [address], [place], [place] [address]    Report Status 02/07/2021 FINAL  Final  Resp Panel by RT-PCR (Flu A&B, Covid) Nasopharyngeal Swab     Status: None   Collection Time: 02/02/21  7:33 PM   Specimen: Nasopharyngeal Swab; Nasopharyngeal(NP) swabs in vial transport medium  Result Value Ref Range Status   SARS Coronavirus 2 by RT PCR NEGATIVE NEGATIVE Final    Comment: (NOTE) SARS-CoV-2 target nucleic acids are NOT DETECTED.  The SARS-CoV-2 RNA is generally detectable in upper respiratory specimens during the acute phase of infection. The lowest  of SARS-CoV-2 viral copies this assay can detect is °138 copies/mL. A negative result does not preclude SARS-Cov-2 °infection and should not be used as the sole basis for treatment or °other patient management decisions. A negative result may occur with  °improper specimen collection/handling, submission of specimen other °than nasopharyngeal swab, presence of viral mutation(s) within the °areas targeted by this assay, and inadequate number of viral °copies(<138 copies/mL). A negative result must be combined with °clinical observations, patient history, and epidemiological °information. The expected result is Negative. ° °Fact Sheet for Patients:  °https://www.fda.gov/media/152166/download ° °Fact Sheet for Healthcare Providers:  °https://www.fda.gov/media/152162/download ° °This test is no t yet approved or cleared by the [place]  [place] FDA and  °has been authorized for detection and/or diagnosis of SARS-CoV-2 by °FDA under an Emergency Use Authorization (EUA). This EUA will remain  °in effect (meaning this test can be used) for the duration of the °COVID-19 declaration under Section 564(b)(1) of the Act, 21 °U.S.C.section 360bbb-3(b)(1), unless the authorization is terminated  °or revoked sooner.  ° ° °  ° Influenza A by PCR NEGATIVE NEGATIVE Final  ° Influenza B by PCR NEGATIVE NEGATIVE Final  °  Comment: (NOTE) °The Xpert Xpress SARS-CoV-2/FLU/RSV plus assay is intended as an aid °in the diagnosis of influenza from Nasopharyngeal swab specimens and °should not be used as a sole basis for treatment. Nasal washings and °aspirates are unacceptable for Xpert Xpress SARS-CoV-2/FLU/RSV °testing. ° °Fact Sheet for Patients: °https://www.fda.gov/media/152166/download ° °Fact Sheet for Healthcare Providers: °https://www.fda.gov/media/152162/download ° °This test is not yet approved or cleared by the [place] FDA and °has been authorized for detection and/or diagnosis of SARS-CoV-2 by °FDA under an Emergency Use Authorization (EUA). This EUA will remain °in effect (meaning this test can be used) for the duration of the °COVID-19 declaration under Section 564(b)(1) of the Act, 21 U.S.C. °section 360bbb-3(b)(1), unless the authorization is terminated or °revoked. ° °Performed at [facility], [address] °[place], [place] [address] °  °MRSA Next Gen by PCR, Nasal     Status: None  ° Collection Time: 02/04/21  8:08 PM  ° Specimen: Nasal Mucosa; Nasal Swab  °Result Value Ref Range Status  ° MRSA by PCR Next Gen NOT DETECTED NOT DETECTED Final  °  Comment: (NOTE) °The GeneXpert MRSA Assay (FDA approved for NASAL specimens only), °is one component of a comprehensive MRSA colonization surveillance °program. It is not intended to diagnose MRSA infection nor to guide °or monitor treatment for MRSA infections. °Test performance is  not FDA approved in patients less than 2 years °old. °Performed at [facility], [address] °[place], [place] [address] °  °  ° ° ° ° °Radiology Studies: °No results found. ° ° ° ° ° °Scheduled Meds: ° docusate sodium  100 mg Oral Daily  ° enoxaparin (LOVENOX) injection  30 mg Subcutaneous Q24H  ° levothyroxine  100 mcg Oral QODAY  ° levothyroxine  112 mcg Oral QODAY  ° loratadine  10 mg Oral Daily  ° mometasone-formoterol  2 puff Inhalation BID  ° olopatadine  1 drop Left Eye BID  ° pantoprazole  40 mg Oral Daily  ° polyethylene glycol  17 g Oral Daily  ° senna  1 tablet Oral Daily  ° °Continuous Infusions: ° vancomycin 750 mg (02/07/21 0845)  ° ° ° LOS: 4 days  ° ° ° ° , MD °[facility] °02/07/2021, 10:31 AM ° °If 7PM-7AM, please contact night-coverage °[email or web address] °

## 2021-02-07 NOTE — TOC Initial Note (Signed)
Transition of Care [facility]) - Initial/Assessment Note   Patient Details  Name: Penny Perez MRN: [id number] Date of Birth: 1932/08/21  [facility] of [facility]) CM/SW Contact:    Sherie Don, LCSW Phone Number: 02/07/2021, 12:51 PM  Clinical Narrative: PT and OT evaluations recommended SNF. CSW spoke with patient's daughter, [name], as patient is oriented x2. Per daughter, family is agreeable to SNF and patient has previously been to [place] and [place].  FL2 done; PASRR confirmed. Initial referral faxed out. TOC awaiting bed offers.  Expected Discharge Plan: Skilled Nursing Facility Barriers to Discharge: Continued Medical Work up, SNF Pending bed offer  Patient Goals and CMS Choice Patient states their goals for this hospitalization and ongoing recovery are:: Go to rehab CMS Medicare.gov Compare Post Acute Care list provided to:: Patient Represenative (must comment) Choice offered to / list presented to : Adult Children  Expected Discharge Plan and Services Expected Discharge Plan: [place] In-house Referral: Clinical Social Work Post Acute Care Choice: [place] Living arrangements for the past 2 months: [place]          DME Arranged: N/A DME Agency: NA  Prior Living Arrangements/Services Living arrangements for the past 2 months: Single Family Home Lives with:: Self Patient language and need for interpreter reviewed:: Yes Do you feel safe going back to the place where you live?: Yes      Need for Family Participation in Patient Care: Yes (Comment) Care giver support system in place?: Yes (comment) Criminal Activity/Legal Involvement Pertinent to Current Situation/Hospitalization: No - Comment as needed  Activities of Daily Living Home Assistive Devices/Equipment: Cane (specify quad or straight), Walker (specify type) ADL Screening (condition at time of admission) Patient's cognitive ability adequate to safely  complete daily activities?: Yes Is the patient deaf or have difficulty hearing?: Yes Does the patient have difficulty seeing, even when wearing glasses/contacts?: Yes Does the patient have difficulty concentrating, remembering, or making decisions?: No Patient able to express need for assistance with ADLs?: Yes Does the patient have difficulty dressing or bathing?: Yes Independently performs ADLs?: Yes (appropriate for developmental age) Does the patient have difficulty walking or climbing stairs?: Yes Weakness of Legs: Both Weakness of Arms/Hands: Both  Permission Sought/Granted Permission sought to share information with : Facility Art therapist granted to share information with : Yes, Verbal Permission Granted Permission granted to share info w AGENCY: SNFs  Emotional Assessment Orientation: : Oriented to Self, Oriented to Place Alcohol / Substance Use: Not Applicable Psych Involvement: No (comment)  Admission diagnosis:  Hyponatremia [E87.1] Left leg cellulitis [L03.116] Constipation [K59.00] Constipation, unspecified constipation type [K59.00] Severe sepsis ([place]) [A41.9, R65.20] Patient Active Problem List   Diagnosis Date Noted   Severe sepsis ([place]) 02/03/2021   Cellulitis 02/03/2021   Hyponatremia 41/28/7867   Acute metabolic encephalopathy 67/20/9470   Constipation 02/02/2021   Uremia    AKI (acute kidney injury) ([place]) 08/19/2019   Normal anion gap metabolic acidosis 96/28/3662   Right rib fracture 08/19/2019   Acute on chronic renal failure w/ Uremia ([place]) 05/05/2019   Essential hypertension 05/05/2019   C. difficile colitis 05/05/2019   Scoliosis of thoracic spine 05/05/2019   Neck pain 05/05/2019   Anxiety 05/05/2019   Muscle spasms of neck 05/05/2019   Acute renal failure superimposed on stage 3 chronic kidney disease ([place]) 05/04/2019   Hormone replacement therapy (HRT) 03/22/2014   HYPERTENSION 12/07/2009   Acute bronchitis 12/07/2009    ALLERGIC RHINITIS 12/07/2009   OSTEOARTHRITIS,  MULTIPLE JOINTS 12/07/2009   CAROTID BRUIT, LEFT 12/07/2009   PCP:  Chesley Noon, MD Pharmacy:   [facility] [phone] - [place], [place] - [address] [place] [address] [place] [address] Phone: [phone] Fax: [phone]  Readmission Risk Interventions No flowsheet data found.

## 2021-02-07 NOTE — NC FL2 (Signed)
[place] LEVEL OF CARE SCREENING TOOL     IDENTIFICATION  Patient Name: Penny Perez Birthdate: Oct 06, 1932 Sex: female Admission Date (Current Location): 02/02/2021  [facility] and [place] Number:  Herbalist and Address:  [facility],  [place], [place]      Provider Number: [id number]  Attending Physician Name and Address:  Mariel Aloe, MD  Relative Name and Phone Number:  [name] (daughter) Ph: [phone]    Current Level of Care: Hospital Recommended Level of Care: [place] Prior Approval Number:    Date Approved/Denied:   PASRR Number: [id number]  Discharge Plan: SNF    Current Diagnoses: Patient Active Problem List   Diagnosis Date Noted   Severe sepsis ([place]) 02/03/2021   Cellulitis 02/03/2021   Hyponatremia 09/11/4816   Acute metabolic encephalopathy 56/31/4970   Constipation 02/02/2021   Uremia    AKI (acute kidney injury) ([place]) 08/19/2019   Normal anion gap metabolic acidosis 26/37/8588   Right rib fracture 08/19/2019   Acute on chronic renal failure w/ Uremia ([place]) 05/05/2019   Essential hypertension 05/05/2019   C. difficile colitis 05/05/2019   Scoliosis of thoracic spine 05/05/2019   Neck pain 05/05/2019   Anxiety 05/05/2019   Muscle spasms of neck 05/05/2019   Acute renal failure superimposed on stage 3 chronic kidney disease ([place]) 05/04/2019   Hormone replacement therapy (HRT) 03/22/2014   HYPERTENSION 12/07/2009   Acute bronchitis 12/07/2009   ALLERGIC RHINITIS 12/07/2009   OSTEOARTHRITIS, MULTIPLE JOINTS 12/07/2009   CAROTID BRUIT, LEFT 12/07/2009    Orientation RESPIRATION BLADDER Height & Weight     Self, Place  Normal Continent Weight: 108 lb 7.5 oz (49.2 kg) Height:  5' (152.4 cm)  BEHAVIORAL SYMPTOMS/MOOD NEUROLOGICAL BOWEL NUTRITION STATUS   (N/A)  (N/A) Incontinent Diet (Regular diet)  AMBULATORY STATUS COMMUNICATION OF NEEDS Skin   Limited  Assist Verbally Skin abrasions, Other (Comment) (Cellulitis: left leg; Abrasion: left leg, right arm)                       Personal Care Assistance Level of Assistance  Bathing, Feeding, Dressing Bathing Assistance: Maximum assistance (Lower body requires max assist) Feeding assistance: Independent Dressing Assistance: Maximum assistance (Lower body requires max assist)     Functional Limitations Info  Sight, Hearing, Speech Sight Info: Impaired Hearing Info: Impaired Speech Info: Adequate    SPECIAL CARE FACTORS FREQUENCY  PT (By licensed PT), OT (By licensed OT)     PT Frequency: 5x's/week OT Frequency: 5x's/week            Contractures Contractures Info: Not present    Additional Factors Info  Code Status, Allergies Code Status Info: Full Allergies Info: Linezolid, Sulfa Antibiotics, Ampicillin, Cephalexin, Chlorhexidine, Clarithromycin, Codeine, Doxycycline, Erythromycin, Gatifloxacin, Hydrocodone, Penicillins, Latex, Tape           Current Medications (02/07/2021):  This is the current hospital active medication list Current Facility-Administered Medications  Medication Dose Route Frequency Provider Last Rate Last Admin   acetaminophen (TYLENOL) tablet 650 mg  650 mg Oral Q6H PRN Shela Leff, MD   650 mg at 02/06/21 2035   Or   acetaminophen (TYLENOL) suppository 650 mg  650 mg Rectal Q6H PRN Shela Leff, MD       albuterol (PROVENTIL) (2.5 MG/3ML) 0.083% nebulizer solution 2.5 mg  2.5 mg Inhalation Q4H PRN Shela Leff, MD       docusate sodium (COLACE) capsule 100 mg  100 mg Oral Daily Shela Leff, MD   100 mg at 02/07/21 0834   enoxaparin (LOVENOX) injection 30 mg  30 mg Subcutaneous Q24H Poindexter, Leann T, RPH   30 mg at 02/06/21 2248   levothyroxine (SYNTHROID) tablet 100 mcg  100 mcg Oral Derrek Gu, MD   100 mcg at 02/07/21 9379   levothyroxine (SYNTHROID) tablet 112 mcg  112 mcg Oral Derrek Gu,  MD   112 mcg at 02/06/21 0600   loratadine (CLARITIN) tablet 10 mg  10 mg Oral Daily Shela Leff, MD   10 mg at 02/07/21 0240   mometasone-formoterol (DULERA) 100-5 MCG/ACT inhaler 2 puff  2 puff Inhalation BID Shela Leff, MD   2 puff at 02/07/21 0747   naloxegol oxalate (MOVANTIK) tablet 25 mg  25 mg Oral Daily PRN Mariel Aloe, MD   25 mg at 02/03/21 1615   oxyCODONE (Oxy IR/ROXICODONE) immediate release tablet 10 mg  10 mg Oral TID PRN Mariel Aloe, MD   10 mg at 02/07/21 0625   pantoprazole (PROTONIX) EC tablet 40 mg  40 mg Oral Daily Shela Leff, MD   40 mg at 02/07/21 0833   polyethylene glycol (MIRALAX / GLYCOLAX) packet 17 g  17 g Oral Daily Shela Leff, MD   17 g at 02/06/21 0926   senna (SENOKOT) tablet 8.6 mg  1 tablet Oral Daily Shela Leff, MD   8.6 mg at 02/07/21 0833   vancomycin (VANCOREADY) IVPB 750 mg/150 mL  750 mg Intravenous Q36H Lenis Noon, RPH 150 mL/hr at 02/07/21 0845 750 mg at 02/07/21 0845     Discharge Medications: Please see discharge summary for a list of discharge medications.  Relevant Imaging Results:  Relevant Lab Results:   Additional Information SSN: [id number]  Sherie Don, LCSW

## 2021-02-07 NOTE — Progress Notes (Signed)
RN changed bilateral leg and arm dressings per order.

## 2021-02-07 NOTE — Progress Notes (Signed)
Physical Therapy Treatment Patient Details Name: Penny Perez MRN: [id number] DOB: [birth year] Today's Date: 02/07/2021   History of Present Illness Pt admitted from home 2* constipation, multiple falls and skin tears bilat shins (dressings in place).  Pt with hx of DDD, scoliosis, back surgery, Fibromyalgia, bil THR, dementia, chronic hyponatremia, , CKDIIIa and hearing impairment    PT Comments    Pt making gradual progress but does need mod cues and mod A at times.  She ambulated 51' with RW and assist.  Pt lives alone - continue to recommend SNF unless could have near 24 hr care with mod A.   Recommendations for follow up therapy are one component of a multi-disciplinary discharge planning process, led by the attending physician.  Recommendations may be updated based on patient status, additional functional criteria and insurance authorization.  Follow Up Recommendations  Skilled nursing-short term rehab (<3 hours/day)     Assistance Recommended at Discharge Frequent or constant Supervision/Assistance  Patient can return home with the following Direct supervision/assist for financial management;Help with stairs or ramp for entrance;Assist for transportation;Direct supervision/assist for medications management;Assistance with cooking/housework;A lot of help with bathing/dressing/bathroom;A lot of help with walking and/or transfers   Equipment Recommendations  None recommended by PT    Recommendations for Other Services       Precautions / Restrictions Precautions Precautions: Fall Restrictions Weight Bearing Restrictions: No     Mobility  Bed Mobility Overal bed mobility: Needs Assistance Bed Mobility: Supine to Sit     Supine to sit: Mod assist     General bed mobility comments: mod cues for sequencing and mod A to lift trunk and assist with legs    Transfers Overall transfer level: Needs assistance Equipment used: Rolling walker (2 wheels) Transfers: Sit  to/from Stand Sit to Stand: Mod assist           General transfer comment: Cues for hand placement and safety with sitting    Ambulation/Gait Ambulation/Gait assistance: Min assist Gait Distance (Feet): 30 Feet Assistive device: Rolling walker (2 wheels) Gait Pattern/deviations: Step-to pattern;Decreased stride length;Trunk flexed;Shuffle Gait velocity: decr     General Gait Details: increased time with mod cues for posture and RW proximity   Stairs             Wheelchair Mobility    Modified Rankin (Stroke Patients Only)       Balance Overall balance assessment: Needs assistance Sitting-balance support: No upper extremity supported;Feet supported Sitting balance-Leahy Scale: Good Sitting balance - Comments: EOB for 10-15 mins with work on balance with ADLs (washing face, combing hair) with weight shifting and reaching   Standing balance support: Bilateral upper extremity supported;Reliant on assistive device for balance Standing balance-Leahy Scale: Poor Standing balance comment: Requiring RW and min A; required assist for adls in standing                            Cognition Arousal/Alertness: Awake/alert Behavior During Therapy: WFL for tasks assessed/performed Overall Cognitive Status: History of cognitive impairments - at baseline                                 General Comments: HOH        Exercises      General Comments        Pertinent Vitals/Pain Pain Assessment: Faces Faces Pain Scale: Hurts little more Pain Location: R leg  burning in gravity dependent position Pain Descriptors / Indicators: Burning;Sore Pain Intervention(s): Limited activity within patient's tolerance;Monitored during session;Repositioned    Home Living                          Prior Function            PT Goals (current goals can now be found in the care plan section) Progress towards PT goals: Progressing toward goals     Frequency    Min 2X/week      PT Plan Frequency needs to be updated    Co-evaluation              AM-PAC PT "6 Clicks" Mobility   Outcome Measure  Help needed turning from your back to your side while in a flat bed without using bedrails?: A Lot Help needed moving from lying on your back to sitting on the side of a flat bed without using bedrails?: A Lot Help needed moving to and from a bed to a chair (including a wheelchair)?: A Lot Help needed standing up from a chair using your arms (e.g., wheelchair or bedside chair)?: A Lot Help needed to walk in hospital room?: A Lot Help needed climbing 3-5 steps with a railing? : Total 6 Click Score: 11    End of Session Equipment Utilized During Treatment: Gait belt Activity Tolerance: Patient limited by fatigue Patient left: with chair alarm set;in chair;with call bell/phone within reach Nurse Communication: Mobility status PT Visit Diagnosis: Difficulty in walking, not elsewhere classified (R26.2);Muscle weakness (generalized) (M62.81)     Time: 4098-1191 PT Time Calculation (min) (ACUTE ONLY): 33 min  Charges:  $Gait Training: 8-22 mins $Therapeutic Activity: 8-22 mins                     Abran Richard, PT Acute Rehab Services Pager [phone] [facility] [place] 02/07/2021, 11:44 AM

## 2021-02-08 ENCOUNTER — Inpatient Hospital Stay (HOSPITAL_COMMUNITY): Payer: Medicare Other

## 2021-02-08 DIAGNOSIS — E538 Deficiency of other specified B group vitamins: Secondary | ICD-10-CM | POA: Diagnosis present

## 2021-02-08 DIAGNOSIS — E039 Hypothyroidism, unspecified: Secondary | ICD-10-CM | POA: Diagnosis present

## 2021-02-08 DIAGNOSIS — J45909 Unspecified asthma, uncomplicated: Secondary | ICD-10-CM | POA: Diagnosis present

## 2021-02-08 DIAGNOSIS — N1831 Chronic kidney disease, stage 3a: Secondary | ICD-10-CM | POA: Diagnosis present

## 2021-02-08 DIAGNOSIS — E876 Hypokalemia: Secondary | ICD-10-CM | POA: Diagnosis present

## 2021-02-08 LAB — RESP PANEL BY RT-PCR (FLU A&B, COVID) ARPGX2
Influenza A by PCR: NEGATIVE
Influenza B by PCR: NEGATIVE
SARS Coronavirus 2 by RT PCR: NEGATIVE

## 2021-02-08 NOTE — TOC Progression Note (Signed)
Transition of Care [facility]) - Progression Note    Patient Details  Name: Penny Perez MRN: [id number] Date of Birth: Jun 29, 1932  Transition of Care [facility]) CM/SW Contact  Lennart Pall, LCSW Phone Number: 02/08/2021, 2:03 PM  Clinical Narrative:    Have reviewed SNF bed offers with pt and daughter and they have accepted bed at [facility] who can admit her tomorrow.  MD/RN aware. Have begun insurance authorization.     Expected Discharge Plan: [facility] Barriers to Discharge: Continued Medical Work up, SNF Pending bed offer  Expected Discharge Plan and Services Expected Discharge Plan: [place] In-house Referral: Clinical Social Work   Post Acute Care Choice: [place] Living arrangements for the past 2 months: Single Family Home                 DME Arranged: N/A DME Agency: NA                   Social Determinants of Health (SDOH) Interventions    Readmission Risk Interventions No flowsheet data found.

## 2021-02-08 NOTE — Progress Notes (Signed)
Occupational Therapy Treatment Patient Details Name: Penny Perez MRN: [id number] DOB: 12-03-1932 Today's Date: 02/08/2021   History of present illness Pt admitted from home 2* constipation, multiple falls and skin tears bilat shins (dressings in place).  Pt with hx of DDD, scoliosis, back surgery, Fibromyalgia, bil THR, dementia, chronic hyponatremia, , CKDIIIa and hearing impairment   OT comments  Patient was noted to have improved ability to participate in ADLs in standing with patient able to use one UE support to assist with hygiene tasks on this date. Patient would continue to benefit from skilled OT services at this time while admitted and after d/c to address noted deficits in order to improve overall safety and independence in ADLs.     Recommendations for follow up therapy are one component of a multi-disciplinary discharge planning process, led by the attending physician.  Recommendations may be updated based on patient status, additional functional criteria and insurance authorization.    Follow Up Recommendations  Skilled nursing-short term rehab (<3 hours/day)    Assistance Recommended at Discharge Frequent or constant Supervision/Assistance  Patient can return home with the following  A little help with walking and/or transfers;A lot of help with bathing/dressing/bathroom;Direct supervision/assist for medications management;Assistance with cooking/housework   Equipment Recommendations  None recommended by OT    Recommendations for Other Services      Precautions / Restrictions Precautions Precautions: Fall Precaution Comments: HOH Restrictions Weight Bearing Restrictions: No       Mobility Bed Mobility                    Transfers                         Balance                                           ADL either performed or assessed with clinical judgement   ADL Overall ADL's : Needs assistance/impaired    Eating/Feeding Details (indicate cue type and reason): patient needs set up for self feeding tasks this Am with encouragemnet provided to patient to participate in tasks prior to asking for others to complete Grooming: Set up;Sitting                       Toileting- Clothing Manipulation and Hygiene: Moderate assistance;Sit to/from stand Toileting - Clothing Manipulation Details (indicate cue type and reason): patient was able to take one UE off walker and attempt to participate in hygiene tasks on this date after patient was noted to be soiled attempting to get out of bed.       General ADL Comments: patient was able to transfer from edge of bed to recliner in room with RW with min A with education for proper hand and foot placement.    Extremity/Trunk Assessment              Vision       Perception     Praxis      Cognition Arousal/Alertness: Awake/alert Behavior During Therapy: WFL for tasks assessed/performed Overall Cognitive Status: History of cognitive impairments - at baseline                                 General Comments: HOH  Exercises     Shoulder Instructions       General Comments      Pertinent Vitals/ Pain       Pain Assessment: Faces Faces Pain Scale: Hurts a little bit Pain Location: all over Pain Descriptors / Indicators: Discomfort Pain Intervention(s): Limited activity within patient's tolerance;Monitored during session  Home Living                                          Prior Functioning/Environment              Frequency  Min 2X/week        Progress Toward Goals  OT Goals(current goals can now be found in the care plan section)  Progress towards OT goals: Progressing toward goals     Plan Discharge plan remains appropriate    Co-evaluation                 AM-PAC OT "6 Clicks" Daily Activity     Outcome Measure   Help from another person eating meals?:  None Help from another person taking care of personal grooming?: A Little Help from another person toileting, which includes using toliet, bedpan, or urinal?: A Lot Help from another person bathing (including washing, rinsing, drying)?: A Lot Help from another person to put on and taking off regular upper body clothing?: A Little Help from another person to put on and taking off regular lower body clothing?: A Lot 6 Click Score: 16    End of Session Equipment Utilized During Treatment: Rolling walker (2 wheels);Gait belt  OT Visit Diagnosis: Unsteadiness on feet (R26.81);Muscle weakness (generalized) (M62.81);Pain   Activity Tolerance Patient tolerated treatment well   Patient Left in chair;with call bell/phone within reach;with chair alarm set   Nurse Communication Mobility status        Time: 6440-3474 OT Time Calculation (min): 18 min  Charges: OT General Charges $OT Visit: 1 Visit OT Treatments $Self Care/Home Management : 8-22 mins  Jackelyn Poling OTR/L, MS Acute Rehabilitation Department Office# [phone] Pager# [phone]   Marcellina Millin 02/08/2021, 1:34 PM

## 2021-02-08 NOTE — Progress Notes (Signed)
Physical Therapy Treatment Patient Details Name: Penny Perez MRN: [id number] DOB: October 13, 1932 Today's Date: 02/08/2021   History of Present Illness Pt admitted from home 2* constipation, multiple falls and skin tears bilat shins (dressings in place).  Pt with hx of DDD, scoliosis, back surgery, Fibromyalgia, bil THR, dementia, chronic hyponatremia, CKDIIIa and hearing impairment    PT Comments    Pt agreeable to mobilize and had BM on bed pad upon scooting to EOB.   Pt assisted with pericare upon standing and then ambulated in room.  Pt reports burning bilateral lower leg pain limiting distance.  Pt assisted back to bed and positioned as found (pillows under left side and prevalon boots in place).  Pt anticipates d/c to [facility] SNF for rehab tomorrow.   Recommendations for follow up therapy are one component of a multi-disciplinary discharge planning process, led by the attending physician.  Recommendations may be updated based on patient status, additional functional criteria and insurance authorization.  Follow Up Recommendations  Skilled nursing-short term rehab (<3 hours/day)     Assistance Recommended at Discharge Frequent or constant Supervision/Assistance  Patient can return home with the following Direct supervision/assist for financial management;Help with stairs or ramp for entrance;Assist for transportation;Direct supervision/assist for medications management;Assistance with cooking/housework;A lot of help with bathing/dressing/bathroom;A lot of help with walking and/or transfers   Equipment Recommendations  None recommended by PT    Recommendations for Other Services       Precautions / Restrictions Precautions Precautions: Fall Precaution Comments: HOH Restrictions Weight Bearing Restrictions: No     Mobility  Bed Mobility Overal bed mobility: Needs Assistance Bed Mobility: Supine to Sit;Sit to Supine     Supine to sit: Mod assist Sit to supine: Mod  assist   General bed mobility comments: assist for trunk upright and LEs onto bed    Transfers Overall transfer level: Needs assistance Equipment used: Rolling walker (2 wheels) Transfers: Sit to/from Stand Sit to Stand: Min assist           General transfer comment: verbal cues for hand placement    Ambulation/Gait Ambulation/Gait assistance: Min assist;Min guard Gait Distance (Feet): 30 Feet Assistive device: Rolling walker (2 wheels) Gait Pattern/deviations: Decreased stride length;Trunk flexed;Shuffle Gait velocity: decr     General Gait Details: pt ambulated around room, cues for RW positioning and posture; had to stop for use of BSC once (had stool softener this morning)   Stairs             Wheelchair Mobility    Modified Rankin (Stroke Patients Only)       Balance Overall balance assessment: Needs assistance         Standing balance support: Bilateral upper extremity supported;Reliant on assistive device for balance Standing balance-Leahy Scale: Poor                              Cognition Arousal/Alertness: Awake/alert Behavior During Therapy: WFL for tasks assessed/performed Overall Cognitive Status: History of cognitive impairments - at baseline                                 General Comments: HOH        Exercises      General Comments        Pertinent Vitals/Pain Pain Assessment: Faces Faces Pain Scale: Hurts little more Pain Location: LEs (right lower leg more then  left) Pain Descriptors / Indicators: Discomfort;Burning Pain Intervention(s): Repositioned;Monitored during session    Home Living                          Prior Function            PT Goals (current goals can now be found in the care plan section) Progress towards PT goals: Progressing toward goals    Frequency    Min 2X/week      PT Plan Current plan remains appropriate    Co-evaluation               AM-PAC PT "6 Clicks" Mobility   Outcome Measure  Help needed turning from your back to your side while in a flat bed without using bedrails?: A Lot Help needed moving from lying on your back to sitting on the side of a flat bed without using bedrails?: A Lot Help needed moving to and from a bed to a chair (including a wheelchair)?: A Lot Help needed standing up from a chair using your arms (e.g., wheelchair or bedside chair)?: A Lot Help needed to walk in hospital room?: A Lot Help needed climbing 3-5 steps with a railing? : Total 6 Click Score: 11    End of Session Equipment Utilized During Treatment: Gait belt Activity Tolerance: Patient limited by fatigue;Patient limited by pain Patient left: with call bell/phone within reach;in bed;with bed alarm set Nurse Communication: Mobility status PT Visit Diagnosis: Difficulty in walking, not elsewhere classified (R26.2);Muscle weakness (generalized) (M62.81)     Time: 3546-5681 PT Time Calculation (min) (ACUTE ONLY): 22 min  Charges:  $Gait Training: 8-22 mins                    Jannette Spanner PT, DPT Acute Rehabilitation Services Pager: [phone] Office: [place] 02/08/2021, 3:34 PM

## 2021-02-08 NOTE — Plan of Care (Signed)

## 2021-02-08 NOTE — Progress Notes (Signed)
PROGRESS NOTE    Penny Perez  QQV:[id number] DOB: 1932-11-13 DOA: 02/02/2021 PCP: Chesley Noon, MD   Brief Narrative:  Penny Perez is a 88 y.o. female with a history of fibromyalgia, cervical degenerative disc disease, osteoarthritis, scoliosis, chronic pain on opiate, chronic constipation, CKD stage IIIa, chronic hyponatremia, asthma, allergic rhinitis, hypertension, hypothyroidism, GERD, dementia, hard of hearing. Patient presented secondary to constipation, confusion and multiple falls and was found to have evidence of sepsis secondary to a right lower leg cellulitis. Empiric Vancomycin and Meropenem started.   Assessment & Plan:   Severe sepsis Possible RLE cellulitis Present on admission. Chronic leg wounds. Empiric Vancomycin and meropenem ordered on admission. Blood cultures obtained and are no growth to date. Leukocytosis slightly worsened initially but now resolved.. Afebrile. -Continue vancomycin IV x 7 days last day 1/11. -CRP, ESR, CBC weekly.  Severe constipation Chronic issue. Initially failed fleet enema. Has now had several bowel movements. Abdominal distension increased today. -Continue home Movantik -Scheduled Miralax, Colace, Senokot  Acute on chronic hyponatremia Baseline sodium around 130. Likely secondary to dehydration. Started on IV fluids with improvement.  Acute metabolic encephalopathy Baseline dementia but possibly worsened secondary to infection. Appears to be resolved.  Generalized weakness Multiple falls PT recommending SNF.  Multiple skin tears Wound care recommendations: Topical care will be with saline cleansing once daily followed by placement of nonadherent wound contact layer (white petrolatum/Vaseline). This is to be covered with ABD pads and secured with Kerlix roll gauze/paper tape. A sacral prophylactic foam dressing is to be placed  for pressure injury prevention as well as turning from side to side with time in the  supine position minimized. Prevalon boots are provided for heel floatation/PI prevention.  Asthma Stable. No exacerbation. -Continue Dulera (substituted for home Symbicort) and albuterol  Hypothyroidism -Continue Synthroid  GERD -Continue Protonix (substituted for Prilosec)  Chronic pain -Continue home oxycodon  CKD stage IIIa Stable.  Left eye chemosis Likely irritant etiology. Improved. -Discontinue olopatadine eye drops   DVT prophylaxis: Lovenox Code Status:   Code Status: Full Code Family Communication: None at bedside. Disposition Plan: Discharge to SNF tomorrow.   Consultants:  None  Procedures:  None  Antimicrobials: Vancomycin IV Meropenem IV   Subjective: No acute complaint.  No nausea no vomiting.  Still reports constipation.  Passing gas.  Tolerating oral diet.  Had a BM yesterday.  Objective: Vitals:   02/08/21 0858 02/08/21 1401 02/08/21 1944 02/08/21 2206  BP: 123/63 132/63  131/64  Pulse: 80 61  83  Resp: _0 Temp: 98.5 F (36.9 C) 98.3 F (36.8 C)  98.6 F (37 C)  TempSrc: Oral Oral  Oral  SpO2: 96% 99% 96% 99%  Weight:      Height:        Intake/Output Summary (Last 24 hours) at 02/08/2021 2224 Last data filed at 02/08/2021 2006 Gross per 24 hour  Intake 720 ml  Output 1320 ml  Net -600 ml    Filed Weights   02/02/21 2300  Weight: 49.2 kg    Examination:  Vitals:   02/08/21 0858 02/08/21 1401 02/08/21 1944 02/08/21 2206  BP: 123/63 132/63  131/64  Pulse: 80 61  83  Resp: _1 Temp: 98.5 F (36.9 C) 98.3 F (36.8 C)  98.6 F (37 C)  TempSrc: Oral Oral  Oral  SpO2: 96% 99% 96% 99%  Weight:      Height:  General: Appear in mild distress, no Rash; Oral Mucosa Clear, moist. no Abnormal Neck Mass Or lumps, Conjunctiva normal  Cardiovascular: S1 and S2 Present, no Murmur, Respiratory: good respiratory effort, Bilateral Air entry present and CTA, no Crackles, no wheezes Abdomen: Bowel Sound present,  Soft and distended, no tenderness Extremities: no Pedal edema Neurology: alert and oriented to time, place, and person affect appropriate. no new focal deficit Gait not checked due to patient safety concerns    Data Reviewed: I have personally reviewed following labs and imaging studies  CBC Lab Results  Component Value Date   WBC 8.2 02/07/2021   RBC 4.31 02/07/2021   HGB 12.5 02/07/2021   HCT 39.5 02/07/2021   MCV 91.6 02/07/2021   MCH 29.0 02/07/2021   PLT 229 02/07/2021   MCHC 31.6 02/07/2021   RDW 14.5 02/07/2021   LYMPHSABS 1.5 02/02/2021   MONOABS 1.2 (H) 02/02/2021   EOSABS 0.1 02/02/2021   BASOSABS 0.0 41/96/2229     Last metabolic panel Lab Results  Component Value Date   NA 132 (L) 02/06/2021   K 3.9 02/06/2021   CL 102 02/06/2021   CO2 24 02/06/2021   BUN 14 02/06/2021   CREATININE 1.07 (H) 02/06/2021   GLUCOSE 90 02/06/2021   GFRNONAA 51 (L) 02/06/2021   GFRAA 38 (L) 08/28/2019   CALCIUM 8.7 (L) 02/06/2021   PHOS 2.3 (L) 08/21/2019   PROT 8.1 02/02/2021   ALBUMIN 4.1 02/02/2021   BILITOT 0.7 02/02/2021   ALKPHOS 92 02/02/2021   AST 14 (L) 02/02/2021   ALT 10 02/02/2021   ANIONGAP 6 02/06/2021    CBG (last 3)  No results for input(s): GLUCAP in the last 72 hours.   GFR: Estimated Creatinine Clearance: 27.1 mL/min (A) (by C-G formula based on SCr of 1.07 mg/dL (H)).  Coagulation Profile: No results for input(s): INR, PROTIME in the last 168 hours.  Recent Results (from the past 240 hour(s))  Culture, blood (routine x 2)     Status: None   Collection Time: 02/02/21  7:31 PM   Specimen: BLOOD  Result Value Ref Range Status   Specimen Description   Final    BLOOD BLOOD RIGHT FOREARM Performed at [facility] [address], [place], [place] [address]    Special Requests   Final    BOTTLES DRAWN AEROBIC ONLY Blood Culture results may not be optimal due to an inadequate volume of blood received in culture  bottles Performed at [facility] [address] [place], [place] [address]    Culture   Final    NO GROWTH 5 DAYS Performed at [facility], [place] [address] [place], [place] [address]    Report Status 02/07/2021 FINAL  Final  Culture, blood (routine x 2)     Status: None   Collection Time: 02/02/21  7:31 PM   Specimen: BLOOD  Result Value Ref Range Status   Specimen Description   Final    BLOOD LEFT ANTECUBITAL Performed at [facility] [address], [place], [place] [address]    Special Requests   Final    BOTTLES DRAWN AEROBIC AND ANAEROBIC Blood Culture adequate volume Performed at [facility] [address], [place], [place] [address]    Culture   Final    NO GROWTH 5 DAYS Performed at [facility], [place] [address] [place], [place] [address]    Report Status 02/07/2021 FINAL  Final  Resp Panel by RT-PCR (Flu A&B, Covid) Nasopharyngeal Swab     Status: None   Collection Time: 02/02/21  7:33 PM   Specimen: Nasopharyngeal Swab; Nasopharyngeal(NP) swabs in vial transport medium  Result Value Ref Range Status   SARS Coronavirus 2 by RT PCR NEGATIVE NEGATIVE Final    Comment: (NOTE) SARS-CoV-2 target nucleic acids are NOT DETECTED.  The SARS-CoV-2 RNA is generally detectable in upper respiratory specimens during the acute phase of infection. The lowest concentration of SARS-CoV-2 viral copies this assay can detect is 138 copies/mL. A negative result does not preclude SARS-Cov-2 infection and should not be used as the sole basis for treatment or other patient management decisions. A negative result may occur with  improper specimen collection/handling, submission of specimen other than nasopharyngeal swab, presence of viral mutation(s) within the areas targeted by this assay, and inadequate number of viral copies(<138 copies/mL). A negative result must be combined with clinical observations, patient  history, and epidemiological information. The expected result is Negative.  Fact Sheet for Patients:  EntrepreneurPulse.com.au  Fact Sheet for Healthcare Providers:  IncredibleEmployment.be  This test is no t yet approved or cleared by the [place] FDA and  has been authorized for detection and/or diagnosis of SARS-CoV-2 by FDA under an Emergency Use Authorization (EUA). This EUA will remain  in effect (meaning this test can be used) for the duration of the COVID-19 declaration under Section 564(b)(1) of the Act, 21 U.S.C.section 360bbb-3(b)(1), unless the authorization is terminated  or revoked sooner.       Influenza A by PCR NEGATIVE NEGATIVE Final   Influenza B by PCR NEGATIVE NEGATIVE Final    Comment: (NOTE) The Xpert Xpress SARS-CoV-2/FLU/RSV plus assay is intended as an aid in the diagnosis of influenza from Nasopharyngeal swab specimens and should not be used as a sole basis for treatment. Nasal washings and aspirates are unacceptable for Xpert Xpress SARS-CoV-2/FLU/RSV testing.  Fact Sheet for Patients: EntrepreneurPulse.com.au  Fact Sheet for Healthcare Providers: IncredibleEmployment.be  This test is not yet approved or cleared by the [place] FDA and has been authorized for detection and/or diagnosis of SARS-CoV-2 by FDA under an Emergency Use Authorization (EUA). This EUA will remain in effect (meaning this test can be used) for the duration of the COVID-19 declaration under Section 564(b)(1) of the Act, 21 U.S.C. section 360bbb-3(b)(1), unless the authorization is terminated or revoked.  Performed at [facility], [place] [address], [place], [place] [address]   MRSA Next Gen by PCR, Nasal     Status: None   Collection Time: 02/04/21  8:08 PM   Specimen: Nasal Mucosa; Nasal Swab  Result Value Ref Range Status   MRSA by PCR Next Gen NOT DETECTED NOT DETECTED  Final    Comment: (NOTE) The GeneXpert MRSA Assay (FDA approved for NASAL specimens only), is one component of a comprehensive MRSA colonization surveillance program. It is not intended to diagnose MRSA infection nor to guide or monitor treatment for MRSA infections. Test performance is not FDA approved in patients less than 78 years old. Performed at [facility], [place] [address], [place], [place] [address]   Resp Panel by RT-PCR (Flu A&B, Covid) Nasopharyngeal Swab     Status: None   Collection Time: 02/08/21  3:01 PM   Specimen: Nasopharyngeal Swab; Nasopharyngeal(NP) swabs in vial transport medium  Result Value Ref Range Status   SARS Coronavirus 2 by RT PCR NEGATIVE NEGATIVE Final    Comment: (NOTE) SARS-CoV-2 target  nucleic acids are NOT DETECTED.  The SARS-CoV-2 RNA is generally detectable in upper respiratory specimens during the acute phase of infection. The lowest concentration of SARS-CoV-2 viral copies this assay can detect is 138 copies/mL. A negative result does not preclude SARS-Cov-2 infection and should not be used as the sole basis for treatment or other patient management decisions. A negative result may occur with  improper specimen collection/handling, submission of specimen other than nasopharyngeal swab, presence of viral mutation(s) within the areas targeted by this assay, and inadequate number of viral copies(<138 copies/mL). A negative result must be combined with clinical observations, patient history, and epidemiological information. The expected result is Negative.  Fact Sheet for Patients:  EntrepreneurPulse.com.au  Fact Sheet for Healthcare Providers:  IncredibleEmployment.be  This test is no t yet approved or cleared by the [place] FDA and  has been authorized for detection and/or diagnosis of SARS-CoV-2 by FDA under an Emergency Use Authorization (EUA). This EUA will remain  in effect  (meaning this test can be used) for the duration of the COVID-19 declaration under Section 564(b)(1) of the Act, 21 U.S.C.section 360bbb-3(b)(1), unless the authorization is terminated  or revoked sooner.       Influenza A by PCR NEGATIVE NEGATIVE Final   Influenza B by PCR NEGATIVE NEGATIVE Final    Comment: (NOTE) The Xpert Xpress SARS-CoV-2/FLU/RSV plus assay is intended as an aid in the diagnosis of influenza from Nasopharyngeal swab specimens and should not be used as a sole basis for treatment. Nasal washings and aspirates are unacceptable for Xpert Xpress SARS-CoV-2/FLU/RSV testing.  Fact Sheet for Patients: EntrepreneurPulse.com.au  Fact Sheet for Healthcare Providers: IncredibleEmployment.be  This test is not yet approved or cleared by the [place] FDA and has been authorized for detection and/or diagnosis of SARS-CoV-2 by FDA under an Emergency Use Authorization (EUA). This EUA will remain in effect (meaning this test can be used) for the duration of the COVID-19 declaration under Section 564(b)(1) of the Act, 21 U.S.C. section 360bbb-3(b)(1), unless the authorization is terminated or revoked.  Performed at [facility], [place] [address], [place], [place] [address]         Radiology Studies: No results found.      Scheduled Meds:  docusate sodium  100 mg Oral Daily   enoxaparin (LOVENOX) injection  30 mg Subcutaneous Q24H   levothyroxine  100 mcg Oral QODAY   levothyroxine  112 mcg Oral QODAY   loratadine  10 mg Oral Daily   mometasone-formoterol  2 puff Inhalation BID   pantoprazole  40 mg Oral Daily   polyethylene glycol  17 g Oral Daily   senna  1 tablet Oral Daily   Continuous Infusions:  vancomycin 750 mg (02/08/21 2036)     LOS: 5 days     Berle Mull, MD  [facility] 02/08/2021, 10:24 PM  If 7PM-7AM, please contact night-coverage [email or web address]

## 2021-02-09 DIAGNOSIS — K219 Gastro-esophageal reflux disease without esophagitis: Secondary | ICD-10-CM | POA: Diagnosis present

## 2021-02-09 DIAGNOSIS — T148XXA Other injury of unspecified body region, initial encounter: Secondary | ICD-10-CM | POA: Diagnosis present

## 2021-02-09 DIAGNOSIS — L0390 Cellulitis, unspecified: Secondary | ICD-10-CM

## 2021-02-09 DIAGNOSIS — G8929 Other chronic pain: Secondary | ICD-10-CM | POA: Diagnosis present

## 2021-02-09 DIAGNOSIS — R338 Other retention of urine: Secondary | ICD-10-CM | POA: Diagnosis not present

## 2021-02-09 MED ORDER — DOCUSATE SODIUM 100 MG PO CAPS: 100.0000 mg | ORAL_CAPSULE | Freq: Every day | ORAL | 0 refills | Status: AC

## 2021-02-09 MED ORDER — OXYCODONE HCL 10 MG PO TABS: 10.0000 mg | ORAL_TABLET | Freq: Three times a day (TID) | ORAL | 0 refills | Status: AC | PRN

## 2021-02-09 MED ORDER — CYANOCOBALAMIN 1000 MCG PO TABS: 1000.0000 ug | ORAL_TABLET | Freq: Every day | ORAL | 0 refills | Status: AC

## 2021-02-09 MED ORDER — VITAMIN B-12 1000 MCG PO TABS
1000.0000 ug | ORAL_TABLET | Freq: Every day | ORAL | Status: DC
  Administered 2021-02-09: 1000 ug via ORAL
  Filled 2021-02-09: qty 1

## 2021-02-09 NOTE — Assessment & Plan Note (Signed)
b12 level 172 Will replace.

## 2021-02-09 NOTE — Assessment & Plan Note (Signed)
Continue inhalers

## 2021-02-09 NOTE — Assessment & Plan Note (Addendum)
Acute on chronic hyponatremia Baseline sodium around 130. Likely secondary to dehydration. Started on IV fluids with improvement. Potassium replaced.

## 2021-02-09 NOTE — Assessment & Plan Note (Signed)
Continue PPI ?

## 2021-02-09 NOTE — Assessment & Plan Note (Signed)
Blood pressure stable without meds.

## 2021-02-09 NOTE — Discharge Summary (Addendum)
Physician Discharge Summary   Patient: Penny Perez MRN: [id number] DOB: July 17, 1932  Admit date:     02/02/2021  Discharge date: 02/09/21  Discharge Physician: Berle Mull   PCP: Chesley Noon, MD   Recommendations at discharge:    Please continue aggressive bowel regimen for constipation.   Discharge Diagnoses Principal Problem:   Sepsis due to cellulitis [facility]) Active Problems:   Constipation   Hyponatremia   Hypokalemia   Acute metabolic encephalopathy   B12 deficiency   Essential hypertension   Asthma, chronic   Chronic kidney disease, stage 3a (HCC)   Hypothyroidism   Chronic pain   GERD (gastroesophageal reflux disease)   Multiple skin tears   Acute urinary retention  Resolved Problems:   * No resolved hospital problems. [place] is a 88 y.o. female with a history of fibromyalgia, cervical degenerative disc disease, osteoarthritis, scoliosis, chronic pain on opiate, chronic constipation, CKD stage IIIa, chronic hyponatremia, asthma, allergic rhinitis, hypertension, hypothyroidism, GERD, dementia, hard of hearing. Patient presented secondary to constipation, confusion and multiple falls and was found to have evidence of sepsis secondary to a right lower leg cellulitis. Empiric Vancomycin and Meropenem started.  * Sepsis due to cellulitis [facility])- (present on admission) Present on admission. Met SIRS criteria with tachycardia and leucocytosis. Chronic leg wounds.  Empiric Vancomycin and meropenem ordered on admission.  Blood cultures obtained and are no growth to date. Leukocytosis slightly worsened initially but now resolved.. Afebrile. Treated with IV vancomycin x 7 days, last day 1/11.  Hypokalemia- (present on admission) Acute on chronic hyponatremia Baseline sodium around 130. Likely secondary to dehydration. Started on IV fluids with improvement. Potassium replaced.   Constipation- (present on admission) Treated  aggressively.  Chronic issue. Initially failed fleet enema. Has now had several bowel movements.  -Continue home Movantik -Scheduled Miralax, Colace, Senokot   Acute metabolic encephalopathy- (present on admission) Baseline dementia but possibly worsened secondary to infection. Appears to be resolved.  B12 deficiency- (present on admission) b12 level 172 Will replace.   Asthma, chronic- (present on admission) Continue inhalers.   Essential hypertension- (present on admission) Blood pressure stable without meds.   Chronic kidney disease, stage 3a ([place])- (present on admission) Creatinine at baseline at 1.07 GFR in 50s.   Hypothyroidism- (present on admission) Continue synthroid.  TSH 3.95   Chronic pain- (present on admission) On chronic oxycodone 10 mg   GERD (gastroesophageal reflux disease)- (present on admission) Continue PPI  Multiple skin tears- (present on admission) Wound care recommendations: Topical care will be with saline cleansing once daily followed by placement of nonadherent wound contact layer (white petrolatum/Vaseline). This is to be covered with ABD pads and secured with Kerlix roll gauze/paper tape. A sacral prophylactic foam dressing is to be placed  for pressure injury prevention as well as turning from side to side with time in the supine position minimized. Prevalon boots are provided for heel floatation/PI prevention.  Acute urinary retention 1/7 pt required 3 in and out catheterization and a foley catheter was inserted. Pt was able to void before discharge after removal of the catheter.       Pain control - [employer] Controlled Substance Reporting System database was reviewed. and patient was instructed, not to drive, operate heavy machinery, perform activities at heights, swimming or participation in water activities or provide baby-sitting services while on Pain, Sleep and Anxiety Medications; until their outpatient Physician has advised to  do so again. Also recommended  to not to take more than prescribed Pain, Sleep and Anxiety Medications.   Consultants: none Procedures performed: none  Disposition: Skilled nursing facility Diet recommendation: Regular diet  DISCHARGE MEDICATION: Allergies as of 02/09/2021       Reactions   Linezolid Nausea And Vomiting   Sulfa Antibiotics    Other reaction(s): Other Abdominal pain and bloating Abdominal pain and bloating   Ampicillin Other (See Comments)   Ulcers in mouth,stomach upset   Cephalexin Other (See Comments)   Mouth ulcer   Chlorhexidine Itching   Clarithromycin Other (See Comments)   Mouth ulcer   Codeine Other (See Comments)   unknown   Doxycycline Other (See Comments)   Mouth ulcer   Erythromycin Other (See Comments)   Mouth ulcer   Gatifloxacin Other (See Comments)   Mouth ulcer   Hydrocodone Other (See Comments)   unknown   Penicillins Other (See Comments)   Mouth ulcer   Latex Rash   Tape Rash        Medication List     TAKE these medications    AeroChamber MV inhaler by Other route. Use as instructed   albuterol 108 (90 Base) MCG/ACT inhaler Commonly known as: ProAir HFA 2 puffs every 4 hours if needed- rescue inhaler What changed:  how much to take how to take this when to take this reasons to take this additional instructions   budesonide-formoterol 80-4.5 MCG/ACT inhaler Commonly known as: SYMBICORT Inhale 2 puffs into the lungs 2 (two) times daily. Rinse mouth What changed:  when to take this reasons to take this   cetirizine 10 MG tablet Commonly known as: ZYRTEC Take 10 mg by mouth daily.   conjugated estrogens 0.625 MG/GM vaginal cream Commonly known as: Premarin USE AS DIRECTED   cromolyn 5.2 MG/ACT nasal spray Commonly known as: NASALCROM Place 1 spray into the nose 4 (four) times daily as needed for allergies or rhinitis.   cyanocobalamin 1000 MCG tablet Take 1 tablet (1,000 mcg total) by mouth daily.    diazepam 10 MG tablet Commonly known as: VALIUM Take 1 tablet by mouth as needed for anxiety or sleep.   docusate sodium 100 MG capsule Commonly known as: COLACE Take 1 capsule (100 mg total) by mouth daily. Start taking on: February 10, 2021   estradiol 0.05 MG/24HR patch Commonly known as: Vivelle-Dot APPLY 1 PATCH EXTERNALLY TO THE SKIN EVERY 72 HOURS What changed:  how much to take how to take this when to take this additional instructions   hydrOXYzine 10 MG tablet Commonly known as: ATARAX Take 10 mg by mouth 3 (three) times daily as needed for itching.   levothyroxine 112 MCG tablet Commonly known as: SYNTHROID Take 112 mcg by mouth every other day. Alternate Daily between 100 mcg tablet   levothyroxine 100 MCG tablet Commonly known as: SYNTHROID Take 100 mcg by mouth every other day. Alternate Daily between 112 mcg tablet   Movantik 25 MG Tabs tablet Generic drug: naloxegol oxalate Take 1 tablet (25 mg total) by mouth daily as needed (constipation).   nystatin powder Commonly known as: MYCOSTATIN/NYSTOP Apply topically 2 (two) times daily. To effected areas What changed:  when to take this reasons to take this   omeprazole 40 MG capsule Commonly known as: PRILOSEC Take 40 mg by mouth daily.   Oxycodone HCl 10 MG Tabs Take 1 tablet (10 mg total) by mouth 3 (three) times daily as needed (pain).   polyethylene glycol powder 17 GM/SCOOP powder Commonly known  as: MiraLax Start taking 1 capful 3 times a day. Slowly cut back as needed until you have normal bowel movements.   pramoxine 1 % foam Commonly known as: PROCTOFOAM Place 1 application rectally once as needed for hemorrhoids.   Vitamin D3 25 MCG (1000 UT) Caps Take 1,000 Units by mouth daily.               Discharge Care Instructions  (From admission, onward)           Start     Ordered   02/09/21 0000  Discharge wound care:       Comments: Wound care to bilateral LE and left upper  extremity skin tears: Cleanse with NS, pat gently dry. Cover with folded pieces of white petrolatum (Vaseline) gauze, top with ABD pad and secure with Kerlix roll gauze/paper tape.  Change daily. Place feet into [employer].   02/09/21 4239            Contact information for follow-up providers     Chesley Noon, MD. Schedule an appointment as soon as possible for a visit in 2 week(s).   Specialty: Family Medicine Contact information: [place] [address] [phone]              Contact information for after-discharge care     Destination     [facility] .   Service: Skilled Nursing Contact information: [address] [place] [phone]                     Discharge Exam: Danley Danker Weights   02/02/21 2300  Weight: 49.2 kg   Vitals:   02/08/21 2206 02/09/21 0632 02/09/21 0814 02/09/21 0906  BP: 131/64 (!) 147/73 (!) 157/58   Pulse: 83 82 76   Resp: $Remo'17 18 15   'ovaWr$ Temp: 98.6 F (37 C) 98.5 F (36.9 C) 98.4 F (36.9 C)   TempSrc: Oral  Oral   SpO2: 99% 98% 96% 90%  Weight:      Height:        General: Appear in mild distress, no Rash; Oral Mucosa Clear, moist. no Abnormal Neck Mass Or lumps, Conjunctiva normal  Cardiovascular: S1 and S2 Present, no Murmur, Respiratory: good respiratory effort, Bilateral Air entry present and CTA, no Crackles, no wheezes Abdomen: Bowel Sound present, Soft and no tenderness Extremities: trace Pedal edema on right, improving erythema Neurology: alert and oriented to time, place, and person affect appropriate. no new focal deficit Gait not checked due to patient safety concerns   Condition at discharge: good  The results of significant diagnostics from this hospitalization (including imaging, microbiology, ancillary and laboratory) are listed below for reference.   Imaging Studies: CT ABDOMEN PELVIS WO CONTRAST  Result Date:  02/02/2021 CLINICAL DATA:  Abdominal pain, acute, nonlocalized EXAM: CT ABDOMEN AND PELVIS WITHOUT CONTRAST TECHNIQUE: Multidetector CT imaging of the abdomen and pelvis was performed following the standard protocol without IV contrast. COMPARISON:  None. FINDINGS: Lower chest: Bilateral lower lobe scarring versus atelectasis. Bronchial wall thickening. Otherwise no acute abnormality. Hepatobiliary: No focal liver abnormality. No gallstones, gallbladder wall thickening, or pericholecystic fluid. No biliary dilatation. Pancreas: Diffusely atrophic. No focal lesion. Otherwise normal pancreatic contour. No surrounding inflammatory changes. No main pancreatic ductal dilatation. Spleen: Normal in size without focal abnormality. Adrenals/Urinary Tract: The adrenal glands are not well visualized.  No definite nodularity. No nephrolithiasis and no hydronephrosis. No definite contour-deforming renal  mass. No ureterolithiasis or hydroureter. The urinary bladder is unremarkable. Stomach/Bowel: Stomach is within normal limits. No evidence of small bowel wall thickening or dilatation. Stool noted throughout the ascending colon and mid transverse colon. 8 cm rectal stool ball. Mild perirectal and proximal colon fat stranding. Possible mild rectosigmoid bowel wall thickening just proximal to the rectal stool ball. Status post appendectomy. Vascular/Lymphatic: No abdominal aorta or iliac aneurysm. Severe atherosclerotic plaque of the aorta and its branches. No abdominal, pelvic, or inguinal lymphadenopathy. Reproductive: Status post hysterectomy. No adnexal masses. Other: No intraperitoneal free fluid. No intraperitoneal free gas. No organized fluid collection. Musculoskeletal: No abdominal wall hernia or abnormality. No suspicious lytic or blastic osseous lesions. No acute displaced fracture. Thoracolumbar levoscoliosis. L4-L5 posterolateral and interbody fusion. Bilateral hip degenerative changes. IMPRESSION: 1. An 8 cm rectal  stool ball with query developing cervical or colitis. 2.  Aortic Atherosclerosis (ICD10-I70.0). 3. Please note markedly limited evaluation on this noncontrast study. Electronically Signed   By: Iven Finn M.D.   On: 02/02/2021 18:23   CT Head Wo Contrast  Result Date: 02/02/2021 CLINICAL DATA:  Altered level of consciousness EXAM: CT HEAD WITHOUT CONTRAST TECHNIQUE: Contiguous axial images were obtained from the base of the skull through the vertex without intravenous contrast. COMPARISON:  07/11/2009 FINDINGS: Brain: No acute infarct or hemorrhage. Lateral ventricles and midline structures are unremarkable. No acute extra-axial fluid collections. No mass effect. Vascular: No hyperdense vessel or unexpected calcification. Skull: Normal. Negative for fracture or focal lesion. Sinuses/Orbits: No acute finding. Other: None. IMPRESSION: 1. No acute intracranial process. Electronically Signed   By: Randa Ngo M.D.   On: 02/02/2021 20:16   DG CHEST PORT 1 VIEW  Result Date: 02/02/2021 CLINICAL DATA:  Sepsis. EXAM: PORTABLE CHEST 1 VIEW COMPARISON:  Chest x-ray 08/19/2019. FINDINGS: The aorta is tortuous. Cardiac silhouette is within normal limits. There is no focal lung consolidation, pleural effusion or pneumothorax. Right shoulder arthroplasty is present. There is stable scoliosis of the thoracolumbar spine. Lumbar fusion hardware is partially visualized. IMPRESSION: No active disease. Electronically Signed   By: Ronney Asters M.D.   On: 02/02/2021 23:45    Microbiology: Results for orders placed or performed during the hospital encounter of 02/02/21  Culture, blood (routine x 2)     Status: None   Collection Time: 02/02/21  7:31 PM   Specimen: BLOOD  Result Value Ref Range Status   Specimen Description   Final    BLOOD BLOOD RIGHT FOREARM Performed at [facility] [address], [place], [place] [address]    Special Requests   Final    BOTTLES DRAWN AEROBIC ONLY Blood  Culture results may not be optimal due to an inadequate volume of blood received in culture bottles Performed at [facility] [address], [place], [place] [address]    Culture   Final    NO GROWTH 5 DAYS Performed at [facility], [place] [address] [place], [place] [address]    Report Status 02/07/2021 FINAL  Final  Culture, blood (routine x 2)     Status: None   Collection Time: 02/02/21  7:31 PM   Specimen: BLOOD  Result Value Ref Range Status   Specimen Description   Final    BLOOD LEFT ANTECUBITAL Performed at [facility] [address], [place], [place] [address]    Special Requests   Final    BOTTLES DRAWN AEROBIC AND ANAEROBIC Blood Culture adequate volume Performed  at [facility], [place] [address] [place], [place] [address]    Culture   Final    NO GROWTH 5 DAYS Performed at [facility], [place] [address], [place], [place] [address]    Report Status 02/07/2021 FINAL  Final  Resp Panel by RT-PCR (Flu A&B, Covid) Nasopharyngeal Swab     Status: None   Collection Time: 02/02/21  7:33 PM   Specimen: Nasopharyngeal Swab; Nasopharyngeal(NP) swabs in vial transport medium  Result Value Ref Range Status   SARS Coronavirus 2 by RT PCR NEGATIVE NEGATIVE Final    Comment: (NOTE) SARS-CoV-2 target nucleic acids are NOT DETECTED.  The SARS-CoV-2 RNA is generally detectable in upper respiratory specimens during the acute phase of infection. The lowest concentration of SARS-CoV-2 viral copies this assay can detect is 138 copies/mL. A negative result does not preclude SARS-Cov-2 infection and should not be used as the sole basis for treatment or other patient management decisions. A negative result may occur with  improper specimen collection/handling, submission of specimen other than nasopharyngeal swab, presence of viral mutation(s) within the areas targeted by this assay, and inadequate number of  viral copies(<138 copies/mL). A negative result must be combined with clinical observations, patient history, and epidemiological information. The expected result is Negative.  Fact Sheet for Patients:  EntrepreneurPulse.com.au  Fact Sheet for Healthcare Providers:  IncredibleEmployment.be  This test is no t yet approved or cleared by the [place] FDA and  has been authorized for detection and/or diagnosis of SARS-CoV-2 by FDA under an Emergency Use Authorization (EUA). This EUA will remain  in effect (meaning this test can be used) for the duration of the COVID-19 declaration under Section 564(b)(1) of the Act, 21 U.S.C.section 360bbb-3(b)(1), unless the authorization is terminated  or revoked sooner.       Influenza A by PCR NEGATIVE NEGATIVE Final   Influenza B by PCR NEGATIVE NEGATIVE Final    Comment: (NOTE) The Xpert Xpress SARS-CoV-2/FLU/RSV plus assay is intended as an aid in the diagnosis of influenza from Nasopharyngeal swab specimens and should not be used as a sole basis for treatment. Nasal washings and aspirates are unacceptable for Xpert Xpress SARS-CoV-2/FLU/RSV testing.  Fact Sheet for Patients: EntrepreneurPulse.com.au  Fact Sheet for Healthcare Providers: IncredibleEmployment.be  This test is not yet approved or cleared by the [place] FDA and has been authorized for detection and/or diagnosis of SARS-CoV-2 by FDA under an Emergency Use Authorization (EUA). This EUA will remain in effect (meaning this test can be used) for the duration of the COVID-19 declaration under Section 564(b)(1) of the Act, 21 U.S.C. section 360bbb-3(b)(1), unless the authorization is terminated or revoked.  Performed at [facility], [place] [address] [place], [place] [address]   MRSA Next Gen by PCR, Nasal     Status: None   Collection Time: 02/04/21  8:08 PM   Specimen: Nasal  Mucosa; Nasal Swab  Result Value Ref Range Status   MRSA by PCR Next Gen NOT DETECTED NOT DETECTED Final    Comment: (NOTE) The GeneXpert MRSA Assay (FDA approved for NASAL specimens only), is one component of a comprehensive MRSA colonization surveillance program. It is not intended to diagnose MRSA infection nor to guide or monitor treatment for MRSA infections. Test performance is not FDA approved in patients less than 65 years old. Performed at [facility], [place] [address], [place], [place] [address]   Resp Panel by RT-PCR (Flu A&B, Covid) Nasopharyngeal Swab  Status: None   Collection Time: 02/08/21  3:01 PM   Specimen: Nasopharyngeal Swab; Nasopharyngeal(NP) swabs in vial transport medium  Result Value Ref Range Status   SARS Coronavirus 2 by RT PCR NEGATIVE NEGATIVE Final    Comment: (NOTE) SARS-CoV-2 target nucleic acids are NOT DETECTED.  The SARS-CoV-2 RNA is generally detectable in upper respiratory specimens during the acute phase of infection. The lowest concentration of SARS-CoV-2 viral copies this assay can detect is 138 copies/mL. A negative result does not preclude SARS-Cov-2 infection and should not be used as the sole basis for treatment or other patient management decisions. A negative result may occur with  improper specimen collection/handling, submission of specimen other than nasopharyngeal swab, presence of viral mutation(s) within the areas targeted by this assay, and inadequate number of viral copies(<138 copies/mL). A negative result must be combined with clinical observations, patient history, and epidemiological information. The expected result is Negative.  Fact Sheet for Patients:  BloggerCourse.com  Fact Sheet for Healthcare Providers:  SeriousBroker.it  This test is no t yet approved or cleared by the [place] FDA and  has been authorized for detection and/or diagnosis  of SARS-CoV-2 by FDA under an Emergency Use Authorization (EUA). This EUA will remain  in effect (meaning this test can be used) for the duration of the COVID-19 declaration under Section 564(b)(1) of the Act, 21 U.S.C.section 360bbb-3(b)(1), unless the authorization is terminated  or revoked sooner.       Influenza A by PCR NEGATIVE NEGATIVE Final   Influenza B by PCR NEGATIVE NEGATIVE Final    Comment: (NOTE) The Xpert Xpress SARS-CoV-2/FLU/RSV plus assay is intended as an aid in the diagnosis of influenza from Nasopharyngeal swab specimens and should not be used as a sole basis for treatment. Nasal washings and aspirates are unacceptable for Xpert Xpress SARS-CoV-2/FLU/RSV testing.  Fact Sheet for Patients: BloggerCourse.com  Fact Sheet for Healthcare Providers: SeriousBroker.it  This test is not yet approved or cleared by the [place] FDA and has been authorized for detection and/or diagnosis of SARS-CoV-2 by FDA under an Emergency Use Authorization (EUA). This EUA will remain in effect (meaning this test can be used) for the duration of the COVID-19 declaration under Section 564(b)(1) of the Act, 21 U.S.C. section 360bbb-3(b)(1), unless the authorization is terminated or revoked.  Performed at [facility], [address] [place], [place] [address]     Labs: CBC: Recent Labs  Lab 02/02/21 1722 02/03/21 0036 02/04/21 0309 02/07/21 1124  WBC 12.9* 14.9* 10.2 8.2  NEUTROABS 9.9*  --   --   --   HGB 13.3 11.2* 10.5* 12.5  HCT 41.4 33.9* 33.2* 39.5  MCV 89.6 89.0 91.5 91.6  PLT 234 165 200 229   Basic Metabolic Panel: Recent Labs  Lab 02/02/21 1722 02/02/21 2311 02/03/21 0036 02/04/21 0309 02/05/21 0306 02/06/21 0330  NA 126* 127* 128* 130*  --  132*  K 4.1 3.7 3.6 3.3*  --  3.9  CL 94* 97* 98 102  --  102  CO2 26 24 22  21*  --  24  GLUCOSE 107* 109* 99 89  --  90  BUN 23 24* 22  20  --  14  CREATININE 1.12* 1.04* 1.10* 1.07* 1.11* 1.07*  CALCIUM 9.7 9.1 8.8* 8.5*  --  8.7*   Liver Function Tests: Recent Labs  Lab 02/02/21 1722  AST 14*  ALT 10  ALKPHOS 92  BILITOT 0.7  PROT 8.1  ALBUMIN 4.1  CBG: No results for input(s): GLUCAP in the last 168 hours.  Discharge time spent: greater than 30 minutes.  Signed: Berle Mull, MD [facility] 02/09/2021

## 2021-02-09 NOTE — Assessment & Plan Note (Signed)
1/7 pt required 3 in and out catheterization and a foley catheter was inserted. Pt was able to void before discharge after removal of the catheter.

## 2021-02-09 NOTE — TOC Transition Note (Signed)
Transition of Care [facility]) - CM/SW Discharge Note   Patient Details  Name: Kinesha Auten MRN: [id number] Date of Birth: [birth year]  [facility] CM/SW Contact:  Lennart Pall, LCSW Phone Number: 02/09/2021, 10:46 AM   Clinical Narrative:    Pt is medically cleared to [facility] today.  Have received insurance authorization (ref# [id number]).  Daughter to provide dc transporation.  No further TOC needs.   Final next level of care: Skilled Nursing Facility Barriers to Discharge: Barriers Resolved   Patient Goals and CMS Choice Patient states their goals for this hospitalization and ongoing recovery are:: Go to rehab CMS Medicare.gov Compare Post Acute Care list provided to:: Patient Represenative (must comment) Choice offered to / list presented to : Adult Children  Discharge Placement   Existing PASRR number confirmed : 02/07/21            Patient to be transferred to facility by: daughter to provide transport Name of family member notified: daughter    Discharge Plan and Services In-house Referral: Clinical Social Work   Post Acute Care Choice: [place]          DME Arranged: N/A DME Agency: NA                  Social Determinants of Health ([place]) Interventions     Readmission Risk Interventions No flowsheet data found.

## 2021-02-09 NOTE — Assessment & Plan Note (Signed)
Creatinine at baseline at 1.07 GFR in 50s.

## 2021-02-09 NOTE — Assessment & Plan Note (Signed)
Treated aggressively.  Chronic issue. Initially failed fleet enema. Has now had several bowel movements.  -Continue home Movantik -Scheduled Miralax, Colace, Senokot

## 2021-02-09 NOTE — Assessment & Plan Note (Signed)
On chronic oxycodone 10 mg

## 2021-02-09 NOTE — Assessment & Plan Note (Signed)
Continue synthroid.  TSH 3.95

## 2021-02-09 NOTE — Assessment & Plan Note (Signed)
Baseline dementia but possibly worsened secondary to infection. Appears to be resolved.

## 2021-02-09 NOTE — Hospital Course (Signed)
Penny Perez is a 88 y.o. female with a history of fibromyalgia, cervical degenerative disc disease, osteoarthritis, scoliosis, chronic pain on opiate, chronic constipation, CKD stage IIIa, chronic hyponatremia, asthma, allergic rhinitis, hypertension, hypothyroidism, GERD, dementia, hard of hearing. Patient presented secondary to constipation, confusion and multiple falls and was found to have evidence of sepsis secondary to a right lower leg cellulitis. Empiric Vancomycin and Meropenem started.

## 2021-02-09 NOTE — Assessment & Plan Note (Signed)
Wound care recommendations: Topical care will be with saline cleansing once daily followed by placement of nonadherent wound contact layer (white petrolatum/Vaseline). This is to be covered with ABD pads and secured with Kerlix roll gauze/paper tape. A sacral prophylactic foam dressing is to be placed for pressure injury prevention as well as turning from side to side with time in the supine position minimized. Prevalon boots are provided for heel floatation/PI prevention.

## 2021-02-09 NOTE — Plan of Care (Signed)

## 2021-02-09 NOTE — Assessment & Plan Note (Signed)
Present on admission. Met SIRS criteria with tachycardia and leucocytosis. Chronic leg wounds.  Empiric Vancomycin and meropenem ordered on admission.  Blood cultures obtained and are no growth to date. Leukocytosis slightly worsened initially but now resolved.. Afebrile. Treated with IV vancomycin x 7 days, last day 1/11.

## 2021-02-09 NOTE — Progress Notes (Addendum)
Attempted to give report to facility, reached voicemail, left callback number.  1440 Report given to [facility], all questions and concerns addressed.  Pt not in acute distress, dressings to all extremities changed per order. Daughter to transport Pt to SNF with belongings.

## 2021-12-18 ENCOUNTER — Emergency Department (HOSPITAL_COMMUNITY): Payer: Medicare Other

## 2021-12-18 ENCOUNTER — Emergency Department (HOSPITAL_COMMUNITY)
Admission: EM | Admit: 2021-12-18 | Discharge: 2021-12-18 | Disposition: A | Discharge status: Skilled Nursing Facility | Payer: Medicare Other | Attending: Emergency Medicine | Admitting: Emergency Medicine

## 2021-12-18 ENCOUNTER — Encounter (HOSPITAL_COMMUNITY): Payer: Self-pay

## 2021-12-18 DIAGNOSIS — Z79899 Other long term (current) drug therapy: Secondary | ICD-10-CM | POA: Diagnosis not present

## 2021-12-18 DIAGNOSIS — W06XXXA Fall from bed, initial encounter: Secondary | ICD-10-CM | POA: Diagnosis not present

## 2021-12-18 DIAGNOSIS — S81811A Laceration without foreign body, right lower leg, initial encounter: Secondary | ICD-10-CM | POA: Diagnosis not present

## 2021-12-18 DIAGNOSIS — S8991XA Unspecified injury of right lower leg, initial encounter: Secondary | ICD-10-CM | POA: Diagnosis present

## 2021-12-18 DIAGNOSIS — Z91040 Latex allergy status: Secondary | ICD-10-CM | POA: Diagnosis not present

## 2021-12-18 DIAGNOSIS — S0990XA Unspecified injury of head, initial encounter: Secondary | ICD-10-CM | POA: Insufficient documentation

## 2021-12-18 DIAGNOSIS — W19XXXA Unspecified fall, initial encounter: Secondary | ICD-10-CM

## 2021-12-18 LAB — COMPREHENSIVE METABOLIC PANEL
ALT: 8 U/L (ref 0–44)
AST: 15 U/L (ref 15–41)
Albumin: 3.2 g/dL — ABNORMAL LOW (ref 3.5–5.0)
Alkaline Phosphatase: 108 U/L (ref 38–126)
Anion gap: 9 (ref 5–15)
BUN: 19 mg/dL (ref 8–23)
CO2: 24 mmol/L (ref 22–32)
Calcium: 9.4 mg/dL (ref 8.9–10.3)
Chloride: 101 mmol/L (ref 98–111)
Creatinine, Ser: 1.24 mg/dL — ABNORMAL HIGH (ref 0.44–1.00)
GFR, Estimated: 42 mL/min — ABNORMAL LOW (ref >60)
Glucose, Bld: 92 mg/dL (ref 70–99)
Potassium: 3.3 mmol/L — ABNORMAL LOW (ref 3.5–5.1)
Sodium: 134 mmol/L — ABNORMAL LOW (ref 135–145)
Total Bilirubin: 0.4 mg/dL (ref 0.3–1.2)
Total Protein: 6.6 g/dL (ref 6.5–8.1)

## 2021-12-18 LAB — CBC
HCT: 33.5 % — ABNORMAL LOW (ref 36.0–46.0)
Hemoglobin: 10.5 g/dL — ABNORMAL LOW (ref 12.0–15.0)
MCH: 27.3 pg (ref 26.0–34.0)
MCHC: 31.3 g/dL (ref 30.0–36.0)
MCV: 87 fL (ref 80.0–100.0)
Platelets: 274 10*3/uL (ref 150–400)
RBC: 3.85 MIL/uL — ABNORMAL LOW (ref 3.87–5.11)
RDW: 13.9 % (ref 11.5–15.5)
WBC: 7.1 10*3/uL (ref 4.0–10.5)
nRBC: 0 % (ref 0.0–0.2)

## 2021-12-18 MED ORDER — LIDOCAINE-EPINEPHRINE 2 %-1:200000 IJ SOLN
20.0000 mL | Freq: Once | INTRAMUSCULAR | Status: AC
Start: 2021-12-18 — End: 2021-12-18
  Administered 2021-12-18: 20 mL
  Filled 2021-12-18: qty 20

## 2021-12-18 NOTE — ED Triage Notes (Signed)
Pt BIBA from [facility]. Pt fell out of bed this morning. Pt has 3" lac to right lower leg.   Disoriented to situation. Pt at baseline.   Pt received 500cc of fluid for BP 90/60 with EMS. Pt received oxycodone before she left. 122/83 after fluids.

## 2021-12-18 NOTE — ED Notes (Signed)
[facility] called for transport to [facility].

## 2021-12-18 NOTE — ED Provider Notes (Signed)
[facility] DEPT Provider Note   CSN: [id number] Arrival date & time: 12/18/21  3295     History  Chief Complaint  Patient presents with   Lytle Michaels    Penny Perez is a 88 y.o. female.  Who presents after unwitnessed fall from her skilled nursing facility.  She normally ambulates with a walker.  Patient slid out of her bed and onto the floor today.  She has history of dementia and is at her baseline.  No blood thinners.  The history is provided by the nursing home. The history is limited by the condition of the patient.  Fall This is a new problem. The current episode started 1 to 2 hours ago.       Home Medications Prior to Admission medications   Medication Sig Start Date End Date Taking? Authorizing Provider  albuterol (PROAIR HFA) 108 (90 BASE) MCG/ACT inhaler 2 puffs every 4 hours if needed- rescue inhaler Patient taking differently: Inhale 2 puffs into the lungs every 4 (four) hours as needed for wheezing or shortness of breath. 11/01/11 12/08/21  Baird Lyons D, MD  budesonide-formoterol (SYMBICORT) 80-4.5 MCG/ACT inhaler Inhale 2 puffs into the lungs 2 (two) times daily. Rinse mouth Patient taking differently: Inhale 2 puffs into the lungs daily as needed (for shortness of breath). Rinse mouth 11/01/11 12/08/21  Baird Lyons D, MD  cetirizine (ZYRTEC) 10 MG tablet Take 10 mg by mouth daily.    [provider]  Cholecalciferol (VITAMIN D3) 1000 UNITS CAPS Take 1,000 Units by mouth daily.    [provider]  conjugated estrogens (PREMARIN) vaginal cream USE AS DIRECTED Patient not taking: Reported on 12/08/2020 05/05/15   Orlena Sheldon, PA-C  cromolyn (NASALCROM) 5.2 MG/ACT nasal spray Place 1 spray into the nose 4 (four) times daily as needed for allergies or rhinitis.     [provider]  diazepam (VALIUM) 10 MG tablet Take 1 tablet by mouth as needed for anxiety or sleep.  09/04/12   [provider]   docusate sodium (COLACE) 100 MG capsule Take 1 capsule (100 mg total) by mouth daily. 02/10/21   Lavina Hamman, MD  estradiol (VIVELLE-DOT) 0.05 MG/24HR patch APPLY 1 PATCH EXTERNALLY TO THE SKIN EVERY 72 HOURS Patient taking differently: Place 1 patch onto the skin every 3 (three) days. 04/10/16   Orlena Sheldon, PA-C  hydrOXYzine (ATARAX) 10 MG tablet Take 10 mg by mouth 3 (three) times daily as needed for itching. 01/13/21   [provider]  levothyroxine (SYNTHROID) 112 MCG tablet Take 112 mcg by mouth every other day. Alternate Daily between 100 mcg tablet    [provider]  levothyroxine (SYNTHROID, LEVOTHROID) 100 MCG tablet Take 100 mcg by mouth every other day. Alternate Daily between 112 mcg tablet    [provider]  MOVANTIK 25 MG TABS tablet Take 1 tablet (25 mg total) by mouth daily as needed (constipation). 05/08/19   Rai, Vernelle Emerald, MD  nystatin (MYCOSTATIN) powder Apply topically 2 (two) times daily. To effected areas Patient taking differently: Apply topically 2 (two) times daily as needed (skin irritation). To effected areas 12/01/14   Orlena Sheldon, PA-C  omeprazole (PRILOSEC) 40 MG capsule Take 40 mg by mouth daily.  01/21/14   [provider]  Oxycodone HCl 10 MG TABS Take 1 tablet (10 mg total) by mouth 3 (three) times daily as needed (pain). 02/09/21   Lavina Hamman, MD  polyethylene glycol powder (MIRALAX) 17  GM/SCOOP powder Start taking 1 capful 3 times a day. Slowly cut back as needed until you have normal bowel movements. 12/08/20   CardamaGrayce Sessions, MD  pramoxine (PROCTOFOAM) 1 % foam Place 1 application rectally once as needed for hemorrhoids. 12/12/20   [provider]  Spacer/Aero-Holding Chambers (AEROCHAMBER MV) inhaler by Other route. Use as instructed     [provider]  vitamin B-12 1000 MCG tablet Take 1 tablet (1,000 mcg total) by mouth daily. 02/09/21   Lavina Hamman, MD      Allergies     Linezolid, Sulfa antibiotics, Ampicillin, Cephalexin, Chlorhexidine, Clarithromycin, Codeine, Doxycycline, Erythromycin, Gatifloxacin, Hydrocodone, Penicillins, Latex, and Tape    Review of Systems   Review of Systems  Physical Exam Updated Vital Signs BP (!) 127/59 (BP Location: Right Arm)   Pulse 67   Temp 97.6 F (36.4 C) (Oral)   Resp 16   Ht 5' (1.524 m)   Wt 47.6 kg   SpO2 96%   BMI 20.51 kg/m  Physical Exam Vitals and nursing note reviewed.  Constitutional:      General: She is not in acute distress.    Appearance: She is well-developed. She is not diaphoretic.  HENT:     Head: Normocephalic and atraumatic.     Right Ear: External ear normal.     Left Ear: External ear normal.     Nose: Nose normal.     Mouth/Throat:     Mouth: Mucous membranes are moist.  Eyes:     General: No scleral icterus.    Extraocular Movements: Extraocular movements intact.     Conjunctiva/sclera: Conjunctivae normal.     Pupils: Pupils are equal, round, and reactive to light.  Cardiovascular:     Rate and Rhythm: Normal rate and regular rhythm.     Heart sounds: Normal heart sounds. No murmur heard.    No friction rub. No gallop.  Pulmonary:     Effort: Pulmonary effort is normal. No respiratory distress.     Breath sounds: Normal breath sounds.  Abdominal:     General: Bowel sounds are normal. There is no distension.     Palpations: Abdomen is soft. There is no mass.     Tenderness: There is no abdominal tenderness. There is no guarding.  Musculoskeletal:     Cervical back: Normal range of motion.     Comments: No obvious deformities to the lower extremities.  Moves arms and legs without ataxia.  Per her 2 lacerations to the lower extremity  Skin:    General: Skin is warm and dry.  Neurological:     Mental Status: She is alert and oriented to person, place, and time.  Psychiatric:        Behavior: Behavior normal.     ED Results / Procedures / Treatments   Labs (all labs  ordered are listed, but only abnormal results are displayed) Labs Reviewed - No data to display  EKG None  Radiology No results found.  Procedures .Marland KitchenLaceration Repair  Date/Time: 12/18/2021 3:46 PM  Performed by: Margarita Mail, PA-C Authorized by: Margarita Mail, PA-C   Consent:    Consent obtained:  Verbal   Risks discussed:  Infection   Alternatives discussed:  No treatment Universal protocol:    Patient identity confirmed:  Verbally with patient Anesthesia:    Anesthesia method:  Local infiltration   Local anesthetic:  Lidocaine 2% WITH epi Laceration details:    Location:  Leg   Leg location:  R lower leg   Length (cm):  7 Pre-procedure details:    Preparation:  Patient was prepped and draped in usual sterile fashion Exploration:    Wound exploration: wound explored through full range of motion and entire depth of wound visualized   Treatment:    Area cleansed with:  Chlorhexidine   Amount of cleaning:  Standard   Irrigation solution:  Sterile saline   Irrigation method:  Pressure wash Skin repair:    Repair method:  Sutures   Suture size:  4-0   Suture material:  Prolene   Suture technique:  Running locked   Number of sutures:  6 Approximation:    Approximation:  Close Repair type:    Repair type:  Simple Post-procedure details:    Dressing:  Non-adherent dressing   Procedure completion:  Tolerated well, no immediate complications .Marland KitchenLaceration Repair  Date/Time: 12/18/2021 3:48 PM  Performed by: Margarita Mail, PA-C Authorized by: Margarita Mail, PA-C   Consent:    Consent obtained:  Verbal   Consent given by:  Patient   Risks discussed:  Pain, infection, poor cosmetic result and nerve damage Universal protocol:    Patient identity confirmed:  Arm band Anesthesia:    Anesthesia method:  Local infiltration   Local anesthetic:  Lidocaine 2% WITH epi Laceration details:    Location:  Leg   Leg location:  R lower leg   Length (cm):   4 Pre-procedure details:    Preparation:  Patient was prepped and draped in usual sterile fashion Exploration:    Wound exploration: wound explored through full range of motion and entire depth of wound visualized   Treatment:    Area cleansed with:  Chlorhexidine   Amount of cleaning:  Standard   Irrigation solution:  Sterile saline   Irrigation method:  Pressure wash Skin repair:    Repair method:  Sutures   Suture size:  5-0   Suture material:  Prolene   Suture technique:  Simple interrupted   Number of sutures:  3 Approximation:    Approximation:  Close Repair type:    Repair type:  Simple Post-procedure details:    Dressing:  Non-adherent dressing   Procedure completion:  Tolerated well, no immediate complications     Medications Ordered in ED Medications - No data to display  ED Course/ Medical Decision Making/ A&P                           Medical Decision Making Patient here with unwitnessed fall and laceration. The emergent differential diagnosis for trauma is extensive and requires complex medical decision making. The differential includes, but is not limited to traumatic brain injury, Orbital trauma, maxillofacial trauma, skull fracture, blunt/penetrating neck trauma, vertebral artery dissection, whiplash, cervical fracture, neurogenic shock, spinal cord injury, thoracic trauma (blunt/penetrating) cardiac trauma, thoracic and lumbar spine trauma. Abdominal trauma (blunt. Penetrating), genitourinary trauma, extremity fractures, skin lacerations/ abrasions, vascular injuries.  After review of all data points patient does not appear to have any significant injuries except for superficial lacerations which were repaired. Patient appears otherwise appropriate for discharge to her skilled nursing facility with wound care and return precautions  Amount and/or Complexity of Data Reviewed Labs: ordered.    Details: Labs without significant abnormality Radiology: ordered.     Details: Visualized interpreted: Imaging including plain films of the chest and pelvis as well as right tibia and fibula and CT head and C-spine all of which are negative  for acute findings  Risk Prescription drug management.          Final Clinical Impression(s) / ED Diagnoses Final diagnoses:  Fall, initial encounter  Laceration of right lower extremity, initial encounter    Rx / DC Orders ED Discharge Orders     None         Margarita Mail, PA-C 12/18/21 1550    Dorie Rank, MD 12/19/21 1623

## 2021-12-18 NOTE — Discharge Instructions (Addendum)
WOUND CARE Please have your stitches/staples removed in 10 days  or sooner if you have concerns. You may do this at any available urgent care or at your primary care doctor's office.  Keep area clean and dry for 24 hours. Do not remove bandage, if applied.  After 24 hours, remove bandage and wash wound gently with mild soap and warm water. Reapply a new bandage after cleaning wound, if directed.  Continue daily cleansing with soap and water until stitches/staples are removed.  Do not apply any ointments or creams to the wound while stitches/staples are in place, as this may cause delayed healing.  Seek medical careif you experience any of the following signs of infection: Swelling, redness, pus drainage, streaking, fever >101.0 F  Seek care if you experience excessive bleeding that does not stop after 15-20 minutes of constant, firm pressure.

## 2021-12-18 NOTE — ED Notes (Signed)
Attempted to call repot to [facility]. Phone continues to ring without answer. Called back, left voicemail for DON to call back.

## 2021-12-18 NOTE — ED Notes (Addendum)
Wrapped pt's leg, used nonadhesive

## 2022-04-03 ENCOUNTER — Ambulatory Visit (HOSPITAL_BASED_OUTPATIENT_CLINIC_OR_DEPARTMENT_OTHER): Payer: Medicare Other | Admitting: Cardiology

## 2022-04-03 ENCOUNTER — Encounter (HOSPITAL_BASED_OUTPATIENT_CLINIC_OR_DEPARTMENT_OTHER): Payer: Self-pay | Admitting: Cardiology

## 2022-04-03 VITALS — BP 130/74 | HR 80 | Ht 60.0 in | Wt 127.7 lb

## 2022-04-03 DIAGNOSIS — N1831 Chronic kidney disease, stage 3a: Secondary | ICD-10-CM | POA: Diagnosis not present

## 2022-04-03 DIAGNOSIS — I350 Nonrheumatic aortic (valve) stenosis: Secondary | ICD-10-CM

## 2022-04-03 DIAGNOSIS — I10 Essential (primary) hypertension: Secondary | ICD-10-CM

## 2022-04-03 DIAGNOSIS — Z7189 Other specified counseling: Secondary | ICD-10-CM

## 2022-04-03 DIAGNOSIS — R011 Cardiac murmur, unspecified: Secondary | ICD-10-CM | POA: Diagnosis not present

## 2022-04-03 NOTE — Progress Notes (Signed)
Cardiology Office Note:    Date:  04/03/2022   ID:  Penny Perez, DOB February 20, 1932, MRN [id number]  PCP:  Jodi Marble, MD  Cardiologist:  Buford Dresser, MD  Referring MD: Jodi Marble, MD   CC: new patient evaluation for murmur  History of Present Illness:    Penny Perez is a 89 y.o. female with a hx of CKD 3a, hypertension, fibromyalgia who is seen as a new consult at the request of Jodi Marble, MD for the evaluation and management of murmur.  Referral from [facility] dated 03/22/22 reviewed. PMH chronic kidney disease stage 3a, hypertension, fibromyalgia. BP logs included, overall well controlld. No recent medical notes from provider. Referral comments note heart murmur as reason for referral.  Today: Here with her daughter today. Patient is very hard of hearing. She denies any current symptoms or concerns. Discussed referral for murmur, prior history of mild-moderate AS. She denies any chest pain, shortness of breath, PND, orthopnea, LE edema, or syncope. She is minimally active/largely sedentary.  Reviewed recent hospitalization (non-cardiac).  Did discuss what AS is, what TAVR is in general today. See discussion below.   Past Medical History:  Diagnosis Date   ALLERGIC RHINITIS    Asthma    Bronchitis    DDD (degenerative disc disease), cervical    Esophageal reflux    Fibromyalgia    Osteoarthritis    Scoliosis    Unspecified essential hypertension     Past Surgical History:  Procedure Laterality Date   APPENDECTOMY     BACK SURGERY  2013   lumb fusion   COLONOSCOPY     ERCP     IR FLUORO GUIDE CV LINE RIGHT  08/20/2019   IR US GUIDE VASC ACCESS RIGHT  08/20/2019   ORIF TOE FRACTURE  02/27/2012   Procedure: OPEN REDUCTION INTERNAL FIXATION (ORIF) METATARSAL (TOE) FRACTURE;  Surgeon: Colin Rhein, MD;  Location: [place] [facility];  Service: Orthopedics;  Laterality: Right;  RIGHT OPEN REDUCTION INTERNAL FIXATION  NON UNION 1ST METATARSAL NECK,FLEXOR HALLUCIS LONGUS TO PROXIMAL PHALANAX TENDON TRANSFER,  GASTROC SLIDE,  LOCAL BONE GRAFT    SHOULDER ARTHROSCOPY  2011   rt   TENDON TRANSFER  02/27/2012   Procedure: TENDON TRANSFER;  Surgeon: Colin Rhein, MD;  Location: [facility];  Service: Orthopedics;  Laterality: Right;   TOTAL ABDOMINAL HYSTERECTOMY     TOTAL ABDOMINAL HYSTERECTOMY W/ BILATERAL SALPINGOOPHORECTOMY     TOTAL HIP ARTHROPLASTY  03,09   rt and lt   TOTAL HIP REVISION      Current Medications: Current Outpatient Medications on File Prior to Visit  Medication Sig   acetaminophen (TYLENOL) 325 MG tablet Take 650 mg by mouth every 6 (six) hours as needed.   albuterol (PROAIR HFA) 108 (90 BASE) MCG/ACT inhaler 2 puffs every 4 hours if needed- rescue inhaler (Patient taking differently: Inhale 2 puffs into the lungs every 4 (four) hours as needed for wheezing or shortness of breath.)   cetirizine (ZYRTEC) 10 MG tablet Take 10 mg by mouth daily.   Cholecalciferol (VITAMIN D3) 1000 UNITS CAPS Take 1,000 Units by mouth daily.   cromolyn (NASALCROM) 5.2 MG/ACT nasal spray Place 1 spray into the nose 4 (four) times daily as needed for allergies or rhinitis.    diazepam (VALIUM) 10 MG tablet Take 1 tablet by mouth as needed for anxiety or sleep.    docusate sodium (COLACE) 100 MG capsule Take 1 capsule (100 mg total)  by mouth daily.   furosemide (LASIX) 40 MG tablet Take 40 mg by mouth daily.   hydrOXYzine (ATARAX) 10 MG tablet Take 10 mg by mouth 3 (three) times daily as needed for itching.   levothyroxine (SYNTHROID) 125 MCG tablet Take 125 mcg by mouth daily.   mirtazapine (REMERON) 7.5 MG tablet Take 7.5 mg by mouth at bedtime.   MOVANTIK 25 MG TABS tablet Take 1 tablet (25 mg total) by mouth daily as needed (constipation).   nitroGLYCERIN (NITROSTAT) 0.4 MG SL tablet Place under the tongue every 5 (five) minutes as needed for chest pain.   nystatin (MYCOSTATIN) powder Apply  topically 2 (two) times daily. To effected areas (Patient taking differently: Apply topically 2 (two) times daily as needed (skin irritation). To effected areas)   omeprazole (PRILOSEC) 40 MG capsule Take 40 mg by mouth daily.    Oxycodone HCl 10 MG TABS Take 1 tablet (10 mg total) by mouth 3 (three) times daily as needed (pain).   polyethylene glycol powder (MIRALAX) 17 GM/SCOOP powder Start taking 1 capful 3 times a day. Slowly cut back as needed until you have normal bowel movements.   potassium chloride SA (KLOR-CON M) 20 MEQ tablet Take 20 mEq by mouth daily.   pramoxine (PROCTOFOAM) 1 % foam Place 1 application rectally once as needed for hemorrhoids.   Spacer/Aero-Holding Chambers (AEROCHAMBER MV) inhaler by Other route. Use as instructed    triamcinolone cream (KENALOG) 0.5 % Apply 1 Application topically daily.   vitamin B-12 1000 MCG tablet Take 1 tablet (1,000 mcg total) by mouth daily.   No current facility-administered medications on file prior to visit.     Allergies:   Linezolid, Sulfa antibiotics, Ampicillin, Cephalexin, Chlorhexidine, Clarithromycin, Codeine, Doxycycline, Erythromycin, Gatifloxacin, Hydrocodone, Penicillins, Latex, and Tape   Social History   Tobacco Use   Smoking status: Never   Smokeless tobacco: Never  Substance Use Topics   Alcohol use: No   Drug use: No    Family History: family history includes Asthma in her mother; Ovarian cancer in her mother; Pancreatic cancer in her father.  ROS:   Please see the history of present illness.  Additional pertinent ROS: Constitutional: Negative for chills, fever, night sweats, unintentional weight loss  HENT: Negative for ear pain and hearing loss.   Eyes: Negative for loss of vision and eye pain.  Respiratory: Negative for cough, sputum, wheezing.   Cardiovascular: See HPI. Gastrointestinal: Negative for abdominal pain, melena, and hematochezia.  Genitourinary: Negative for dysuria and hematuria.   Musculoskeletal: Negative for falls and myalgias.  Skin: Negative for itching and rash.  Neurological: Negative for focal weakness, focal sensory changes and loss of consciousness.  Endo/Heme/Allergies: Does not bruise/bleed easily.     EKGs/Labs/Other Studies Reviewed:    The following studies were reviewed today: Echo 10/23/19  1. Left ventricular ejection fraction, by estimation, is 65 to 70%. The  left ventricle has normal function. The left ventricle has no regional  wall motion abnormalities. There is mild left ventricular hypertrophy.  Left ventricular diastolic parameters  are indeterminate.   2. Right ventricule is not well visualized but appears systolic function  is grossly normal. The right ventricular size appears grossly mildly  enlarged. There is normal pulmonary artery systolic pressure.   3. Left atrial size was mildly dilated.   4. The mitral valve is normal in structure. Trivial mitral valve  regurgitation.   5. The aortic valve is calcified. There is moderate calcification of the  aortic  valve. Aortic valve regurgitation is mild. Mild to moderate aortic  valve stenosis. Mean gradient is only 8 mmHg, but suspect has paradoxical  low flow low gradient aortic  stenosis as SV index is low (31 cc/m^2). AVA 1.3 cm^2, DI 0.46. Suspect  mild to moderate aortic stenosis   EKG:  EKG is personally reviewed.   04/03/22: SR with PVC, PRWP, 80 bpm  Recent Labs: 12/18/2021: ALT 8; BUN 19; Creatinine, Ser 1.24; Hemoglobin 10.5; Platelets 274; Potassium 3.3; Sodium 134  Recent Lipid Panel No results found for: "CHOL", "TRIG", "HDL", "CHOLHDL", "VLDL", "LDLCALC", "LDLDIRECT"  Physical Exam:    VS:  BP 130/74 (BP Location: Right Arm, Patient Position: Sitting, Cuff Size: Normal)   Pulse 80   Ht 5' (1.524 m)   Wt 127 lb 11.2 oz (57.9 kg)   BMI 24.94 kg/m     Wt Readings from Last 3 Encounters:  04/03/22 127 lb 11.2 oz (57.9 kg)  12/18/21 105 lb (47.6 kg)  02/02/21 108  lb 7.5 oz (49.2 kg)    GEN: Well nourished, well developed in no acute distress HEENT: Normal, moist mucous membranes NECK: No JVD CARDIAC: regular rhythm, normal S1 and S2, no rubs or gallops. 3/6 mid peaking systolic ejection murmur, S2 present but diminished. Radiates to carotids VASCULAR: Radial and DP pulses 2+ bilaterally. RESPIRATORY:  Clear to auscultation without rales, wheezing or rhonchi  ABDOMEN: Soft, non-tender, non-distended MUSCULOSKELETAL:  in wheelchair today but moves all 4 limbs independently SKIN: Warm and dry, no edema NEUROLOGIC:  Alert and oriented x 3. No focal neuro deficits noted. PSYCHIATRIC:  Normal affect    ASSESSMENT:    1. Murmur, cardiac   2. Essential hypertension   3. Chronic kidney disease, stage 3a ([place])   4. Cardiac risk counseling   5. Nonrheumatic aortic valve stenosis    PLAN:    Murmur History of mild-moderate AS -echo ordered today -echo from 2021 reviewed, noted mild-moderate AS -discussed with family, in the absence of symptoms, would monitor. She is very sedentary, asymptomatic. -reviewed red flag warning signs that need immediate medical attention  Hypertension Chronic kidney disease, stage 3a -at goal today, continue current medications  Cardiac risk counseling and prevention recommendations: -recommend heart healthy/Mediterranean diet, with whole grains, fruits, vegetable, fish, lean meats, nuts, and olive oil. Limit salt. -recommend moderate walking, 3-5 times/week for 30-50 minutes each session. Aim for at least 150 minutes.week. Goal should be pace of 3 miles/hours, or walking 1.5 miles in 30 minutes -recommend avoidance of tobacco products. Avoid excess alcohol.  Plan for follow up: 1 year or sooner as needed  Buford Dresser, MD, PhD, [facility] at [facility] at [facility] [address] [place], [place] [address] [phone]   Medication Adjustments/Labs and Tests Ordered: Current medicines are reviewed at length with the patient today.  Concerns regarding medicines are outlined above.  Orders Placed This Encounter  Procedures   EKG 12-Lead   ECHOCARDIOGRAM COMPLETE   No orders of the defined types were placed in this encounter.   Patient Instructions  Medication Instructions:  The current medical regimen is effective;  continue present plan and medications.   *If you need a refill on your cardiac medications before your next appointment, please call your pharmacy*   Lab Work: None   Testing/Procedures: Your physician has requested that you have an echocardiogram. Echocardiography is a painless test that uses sound waves  to create images of your heart. It provides your doctor with information about the size and shape of your heart and how well your heart's chambers and valves are working. This procedure takes approximately one hour. There are no restrictions for this procedure. Please do NOT wear cologne, perfume, aftershave, or lotions (deodorant is allowed). Please arrive 15 minutes prior to your appointment time.    Follow-Up: At [facility], you and your health needs are our priority.  As part of our continuing mission to provide you with exceptional heart care, we have created designated Provider Care Teams.  These Care Teams include your primary Cardiologist (physician) and Advanced Practice Providers (APPs -  Physician Assistants and Nurse Practitioners) who all work together to provide you with the care you need, when you need it.  We recommend signing up for the patient portal called "MyChart".  Sign up information is provided on this After Visit Summary.  MyChart is used to connect with patients for Virtual Visits (Telemedicine).  Patients are able to view lab/test results, encounter notes, upcoming appointments, etc.  Non-urgent messages can be sent to your provider as well.    To learn more about what you can do with MyChart, go to [email or web address].    Your next appointment:   1 year(s)  Provider:   Buford Dresser, MD    Other Instructions None   Signed, Buford Dresser, MD PhD 04/03/2022 1:13 PM    [place]

## 2022-04-03 NOTE — Patient Instructions (Signed)
Medication Instructions:  The current medical regimen is effective;  continue present plan and medications.   *If you need a refill on your cardiac medications before your next appointment, please call your pharmacy*   Lab Work: None   Testing/Procedures: Your physician has requested that you have an echocardiogram. Echocardiography is a painless test that uses sound waves to create images of your heart. It provides your doctor with information about the size and shape of your heart and how well your heart's chambers and valves are working. This procedure takes approximately one hour. There are no restrictions for this procedure. Please do NOT wear cologne, perfume, aftershave, or lotions (deodorant is allowed). Please arrive 15 minutes prior to your appointment time.    Follow-Up: At [facility], you and your health needs are our priority.  As part of our continuing mission to provide you with exceptional heart care, we have created designated Provider Care Teams.  These Care Teams include your primary Cardiologist (physician) and Advanced Practice Providers (APPs -  Physician Assistants and Nurse Practitioners) who all work together to provide you with the care you need, when you need it.  We recommend signing up for the patient portal called "MyChart".  Sign up information is provided on this After Visit Summary.  MyChart is used to connect with patients for Virtual Visits (Telemedicine).  Patients are able to view lab/test results, encounter notes, upcoming appointments, etc.  Non-urgent messages can be sent to your provider as well.   To learn more about what you can do with MyChart, go to [email or web address].    Your next appointment:   1 year(s)  Provider:   Buford Dresser, MD    Other Instructions None

## 2022-04-25 ENCOUNTER — Ambulatory Visit (INDEPENDENT_AMBULATORY_CARE_PROVIDER_SITE_OTHER): Payer: Medicare Other

## 2022-04-25 DIAGNOSIS — R011 Cardiac murmur, unspecified: Secondary | ICD-10-CM | POA: Diagnosis not present

## 2022-04-25 LAB — ECHOCARDIOGRAM COMPLETE
AR max vel: 0.93 cm2
AV Area VTI: 0.87 cm2
AV Area mean vel: 0.92 cm2
AV Mean grad: 10.1 mmHg
AV Peak grad: 16.6 mmHg
Ao pk vel: 2.04 m/s
Area-P 1/2: 3.08 cm2
S' Lateral: 2.79 cm

## 2022-07-13 ENCOUNTER — Encounter (HOSPITAL_COMMUNITY): Payer: Self-pay

## 2022-07-13 ENCOUNTER — Emergency Department (HOSPITAL_COMMUNITY)
Admission: EM | Admit: 2022-07-13 | Discharge: 2022-07-13 | Disposition: A | Discharge status: Home / Self Care | Payer: Medicare Other | Attending: Emergency Medicine | Admitting: Emergency Medicine

## 2022-07-13 ENCOUNTER — Other Ambulatory Visit: Payer: Self-pay

## 2022-07-13 DIAGNOSIS — H5711 Ocular pain, right eye: Secondary | ICD-10-CM | POA: Diagnosis present

## 2022-07-13 DIAGNOSIS — Z91040 Latex allergy status: Secondary | ICD-10-CM | POA: Insufficient documentation

## 2022-07-13 DIAGNOSIS — H1031 Unspecified acute conjunctivitis, right eye: Secondary | ICD-10-CM | POA: Diagnosis not present

## 2022-07-13 DIAGNOSIS — L0390 Cellulitis, unspecified: Secondary | ICD-10-CM | POA: Diagnosis not present

## 2022-07-13 LAB — CBC WITH DIFFERENTIAL/PLATELET
Abs Immature Granulocytes: 0.04 10*3/uL (ref 0.00–0.07)
Basophils Absolute: 0 10*3/uL (ref 0.0–0.1)
Basophils Relative: 0 %
Eosinophils Absolute: 0.9 10*3/uL — ABNORMAL HIGH (ref 0.0–0.5)
Eosinophils Relative: 12 %
HCT: 38.7 % (ref 36.0–46.0)
Hemoglobin: 12.3 g/dL (ref 12.0–15.0)
Immature Granulocytes: 1 %
Lymphocytes Relative: 17 %
Lymphs Abs: 1.3 10*3/uL (ref 0.7–4.0)
MCH: 29.6 pg (ref 26.0–34.0)
MCHC: 31.8 g/dL (ref 30.0–36.0)
MCV: 93.3 fL (ref 80.0–100.0)
Monocytes Absolute: 0.7 10*3/uL (ref 0.1–1.0)
Monocytes Relative: 9 %
Neutro Abs: 4.5 10*3/uL (ref 1.7–7.7)
Neutrophils Relative %: 61 %
Platelets: 251 10*3/uL (ref 150–400)
RBC: 4.15 MIL/uL (ref 3.87–5.11)
RDW: 14.2 % (ref 11.5–15.5)
WBC: 7.5 10*3/uL (ref 4.0–10.5)
nRBC: 0 % (ref 0.0–0.2)

## 2022-07-13 LAB — BASIC METABOLIC PANEL
Anion gap: 8 (ref 5–15)
BUN: 20 mg/dL (ref 8–23)
CO2: 26 mmol/L (ref 22–32)
Calcium: 10.3 mg/dL (ref 8.9–10.3)
Chloride: 102 mmol/L (ref 98–111)
Creatinine, Ser: 1.34 mg/dL — ABNORMAL HIGH (ref 0.44–1.00)
GFR, Estimated: 38 mL/min — ABNORMAL LOW (ref >60)
Glucose, Bld: 92 mg/dL (ref 70–99)
Potassium: 4 mmol/L (ref 3.5–5.1)
Sodium: 136 mmol/L (ref 135–145)

## 2022-07-13 MED ORDER — TOBRAMYCIN 0.3 % OP SOLN: 1.0000 [drp] | OPHTHALMIC | 0 refills | Status: AC

## 2022-07-13 MED ORDER — CLINDAMYCIN HCL 150 MG PO CAPS: 150.0000 mg | ORAL_CAPSULE | Freq: Three times a day (TID) | ORAL | 0 refills | Status: AC

## 2022-07-13 NOTE — ED Notes (Signed)
Pt asking if she can eat. Verlon Au, [place] notified

## 2022-07-13 NOTE — ED Provider Notes (Signed)
[facility]ERGENC[facility] DEPARTMENT AT [facility] Provider Note   CSN: [id number] Arrival date & time: 07/13/22  1155     History  Chief Complaint  Patient presents with   Eye Problem    Penny Perez is a 89 y.o. female.  Pt complains of redness to his right eye.  Pt is on bacitracin drops.  Pt has redness to her face today  no fever no chills   The history is provided by the patient. No language interpreter was used.  Eye Problem Location:  Right eye Quality:  Aching Severity:  Mild Timing:  Constant Progression:  Worsening Chronicity:  New Relieved by:  Nothing Worsened by:  Nothing Ineffective treatments:  None tried Associated symptoms: no crusting        Home Medications Prior to Admission medications   Medication Sig Start Date End Date Taking? Authorizing Provider  acetaminophen (TYLENOL) 325 MG tablet Take 650 mg by mouth every 6 (six) hours as needed.    [provider]  albuterol (PROAIR HFA) 108 (90 BASE) MCG/ACT inhaler 2 puffs every 4 hours if needed- rescue inhaler Patient taking differently: Inhale 2 puffs into the lungs every 4 (four) hours as needed for wheezing or shortness of breath. 11/01/11 04/03/22  Jetty Duhamel D, MD  cetirizine (ZYRTEC) 10 MG tablet Take 10 mg by mouth daily.    [provider]  Cholecalciferol (VITAMIN D3) 1000 UNITS CAPS Take 1,000 Units by mouth daily.    [provider]  cromolyn (NASALCROM) 5.2 MG/ACT nasal spray Place 1 spray into the nose 4 (four) times daily as needed for allergies or rhinitis.     [provider]  diazepam (VALIUM) 10 MG tablet Take 1 tablet by mouth as needed for anxiety or sleep.  09/04/12   [provider]  docusate sodium (COLACE) 100 MG capsule Take 1 capsule (100 mg total) by mouth daily. 02/10/21   Rolly Salter, MD  furosemide (LASIX) 40 MG tablet Take 40 mg by mouth daily. 03/08/22   [provider]  hydrOXYzine (ATARAX) 10 MG tablet  Take 10 mg by mouth 3 (three) times daily as needed for itching. 01/13/21   [provider]  levothyroxine (SYNTHROID) 125 MCG tablet Take 125 mcg by mouth daily. 03/08/22   [provider]  mirtazapine (REMERON) 7.5 MG tablet Take 7.5 mg by mouth at bedtime. 03/08/22   [provider]  MOVANTIK 25 MG TABS tablet Take 1 tablet (25 mg total) by mouth daily as needed (constipation). 05/08/19   Rai, Delene Ruffini, MD  nitroGLYCERIN (NITROSTAT) 0.4 MG SL tablet Place under the tongue every 5 (five) minutes as needed for chest pain. 11/13/21   [provider]  nystatin (MYCOSTATIN) powder Apply topically 2 (two) times daily. To effected areas Patient taking differently: Apply topically 2 (two) times daily as needed (skin irritation). To effected areas 12/01/14   Dorena Bodo, PA-C  omeprazole (PRILOSEC) 40 MG capsule Take 40 mg by mouth daily.  01/21/14   [provider]  Oxycodone HCl 10 MG TABS Take 1 tablet (10 mg total) by mouth 3 (three) times daily as needed (pain). 02/09/21   Rolly Salter, MD  polyethylene glycol powder [facility]) 17 GM/SCOOP powder Start taking 1 capful 3 times a day. Slowly cut back as needed until you have normal bowel movements. 12/08/20   Cardama, Amadeo Garnet, MD  potassium chloride SA (KLOR-CON M) 20 MEQ tablet Take 20 mEq by mouth daily. 12/12/21  [provider]  pramoxine (PROCTOFOAM) 1 % foam Place 1 application rectally once as needed for hemorrhoids. 12/12/20   [provider]  Spacer/Aero-Holding Chambers (AEROCHAMBER MV) inhaler by Other route. Use as instructed     [provider]  triamcinolone cream (KENALOG) 0.5 % Apply 1 Application topically daily. 03/27/22   [provider]  vitamin B-12 1000 MCG tablet Take 1 tablet (1,000 mcg total) by mouth daily. 02/09/21   Rolly Salter, MD      Allergies    Linezolid, Sulfa antibiotics, Ampicillin, Cephalexin, Chlorhexidine, Clarithromycin,  Codeine, Doxycycline, Erythromycin, Gatifloxacin, Hydrocodone, Penicillins, Latex, and Tape    Review of Systems   Review of Systems  All other systems reviewed and are negative.   Physical Exam Updated Vital Signs BP (!) 139/109   Pulse 72   Temp 98.6 F (37 C) (Oral)   Resp 18   Ht 5' (1.524 m)   Wt 57.9 kg   SpO2 93%   BMI 24.93 kg/m  Physical Exam Vitals reviewed.  Constitutional:      Appearance: Normal appearance.  Eyes:     Comments: Injected conjunctiva right eye,  slight erythema right cheek.    Cardiovascular:     Rate and Rhythm: Normal rate.  Pulmonary:     Effort: Pulmonary effort is normal.  Musculoskeletal:        General: Normal range of motion.  Neurological:     General: No focal deficit present.     Mental Status: She is alert.  Psychiatric:        Mood and Affect: Mood normal.     ED Results / Procedures / Treatments   Labs (all labs ordered are listed, but only abnormal results are displayed) Labs Reviewed  CBC WITH DIFFERENTIAL/PLATELET - Abnormal; Notable for the following components:      Result Value   Eosinophils Absolute 0.9 (*)    All other components within normal limits  BASIC METABOLIC PANEL - Abnormal; Notable for the following components:   Creatinine, Ser 1.34 (*)    GFR, Estimated 38 (*)    All other components within normal limits    EKG None  Radiology No results found.  Procedures Procedures    Medications Ordered in ED Medications - No data to display  ED Course/ Medical Decision Making/ A&P                             Medical Decision Making Pt complains of redness to her right eye and swelling around her right eye.  Pt has redness to her right cheek   Amount and/or Complexity of Data Reviewed Labs: ordered. Decision-making details documented in ED Course.    Details: Labs ordered reviewed and interpreted.  Pt has  anormal wbc count.    Risk Risk Details: I doubt facial cellulitis,  possible  conjunctivitis.  I will treat with tobrex.  Pt's allergy list reports erythromycin allergy is tongue lesions, hopefully tobrex eye drops will not cause problems            Final Clinical Impression(s) / ED Diagnoses Final diagnoses:  Acute conjunctivitis of right eye, unspecified acute conjunctivitis type    Rx / DC Orders ED Discharge Orders          Ordered    tobramycin (TOBREX) 0.3 % ophthalmic solution  Every 4 hours        07/13/22 1458  An After Visit Summary was printed and given to the patient.    Elson Areas, PA-C 07/13/22 1500    Lorre Nick, MD 07/15/22 [phone]

## 2022-07-13 NOTE — ED Notes (Signed)
[facility] called  

## 2022-07-13 NOTE — ED Triage Notes (Signed)
BIB EMS from [facility] for right eye swelling and redness since Monday. Redness goes down entire right side of face. Pt has had bacitracin applied to eye since 6/12 with no changes. Pt has light sensitivity and some blurry vision in right eye.

## 2022-07-13 NOTE — Discharge Instructions (Addendum)
Return if any problems.

## 2023-06-22 ENCOUNTER — Emergency Department (HOSPITAL_COMMUNITY)

## 2023-06-22 ENCOUNTER — Emergency Department (HOSPITAL_COMMUNITY)
Admission: EM | Admit: 2023-06-22 | Discharge: 2023-06-22 | Disposition: A | Discharge status: Home / Self Care | Attending: Emergency Medicine | Admitting: Emergency Medicine

## 2023-06-22 DIAGNOSIS — J45909 Unspecified asthma, uncomplicated: Secondary | ICD-10-CM | POA: Insufficient documentation

## 2023-06-22 DIAGNOSIS — Z91040 Latex allergy status: Secondary | ICD-10-CM | POA: Diagnosis not present

## 2023-06-22 DIAGNOSIS — I129 Hypertensive chronic kidney disease with stage 1 through stage 4 chronic kidney disease, or unspecified chronic kidney disease: Secondary | ICD-10-CM | POA: Insufficient documentation

## 2023-06-22 DIAGNOSIS — Z79899 Other long term (current) drug therapy: Secondary | ICD-10-CM | POA: Insufficient documentation

## 2023-06-22 DIAGNOSIS — N189 Chronic kidney disease, unspecified: Secondary | ICD-10-CM | POA: Insufficient documentation

## 2023-06-22 DIAGNOSIS — R42 Dizziness and giddiness: Secondary | ICD-10-CM | POA: Diagnosis present

## 2023-06-22 DIAGNOSIS — I10 Essential (primary) hypertension: Secondary | ICD-10-CM

## 2023-06-22 DIAGNOSIS — N3090 Cystitis, unspecified without hematuria: Secondary | ICD-10-CM | POA: Insufficient documentation

## 2023-06-22 DIAGNOSIS — K5900 Constipation, unspecified: Secondary | ICD-10-CM | POA: Diagnosis not present

## 2023-06-22 LAB — COMPREHENSIVE METABOLIC PANEL WITH GFR
ALT: 8 U/L (ref 0–44)
AST: 18 U/L (ref 15–41)
Albumin: 3.8 g/dL (ref 3.5–5.0)
Alkaline Phosphatase: 82 U/L (ref 38–126)
Anion gap: 9 (ref 5–15)
BUN: 11 mg/dL (ref 8–23)
CO2: 24 mmol/L (ref 22–32)
Calcium: 10.5 mg/dL — ABNORMAL HIGH (ref 8.9–10.3)
Chloride: 97 mmol/L — ABNORMAL LOW (ref 98–111)
Creatinine, Ser: 1.17 mg/dL — ABNORMAL HIGH (ref 0.44–1.00)
GFR, Estimated: 45 mL/min — ABNORMAL LOW (ref >60)
Glucose, Bld: 97 mg/dL (ref 70–99)
Potassium: 4.1 mmol/L (ref 3.5–5.1)
Sodium: 130 mmol/L — ABNORMAL LOW (ref 135–145)
Total Bilirubin: 0.5 mg/dL (ref 0.0–1.2)
Total Protein: 7.6 g/dL (ref 6.5–8.1)

## 2023-06-22 LAB — CBC WITH DIFFERENTIAL/PLATELET
Abs Immature Granulocytes: 0.04 10*3/uL (ref 0.00–0.07)
Basophils Absolute: 0.1 10*3/uL (ref 0.0–0.1)
Basophils Relative: 1 %
Eosinophils Absolute: 1.1 10*3/uL — ABNORMAL HIGH (ref 0.0–0.5)
Eosinophils Relative: 13 %
HCT: 41.3 % (ref 36.0–46.0)
Hemoglobin: 13.4 g/dL (ref 12.0–15.0)
Immature Granulocytes: 1 %
Lymphocytes Relative: 30 %
Lymphs Abs: 2.4 10*3/uL (ref 0.7–4.0)
MCH: 30.2 pg (ref 26.0–34.0)
MCHC: 32.4 g/dL (ref 30.0–36.0)
MCV: 93.2 fL (ref 80.0–100.0)
Monocytes Absolute: 0.7 10*3/uL (ref 0.1–1.0)
Monocytes Relative: 9 %
Neutro Abs: 3.7 10*3/uL (ref 1.7–7.7)
Neutrophils Relative %: 46 %
Platelets: 218 10*3/uL (ref 150–400)
RBC: 4.43 MIL/uL (ref 3.87–5.11)
RDW: 13.1 % (ref 11.5–15.5)
WBC: 7.9 10*3/uL (ref 4.0–10.5)
nRBC: 0 % (ref 0.0–0.2)

## 2023-06-22 LAB — URINALYSIS, ROUTINE W REFLEX MICROSCOPIC
Bilirubin Urine: NEGATIVE
Glucose, UA: NEGATIVE mg/dL
Hgb urine dipstick: NEGATIVE
Ketones, ur: NEGATIVE mg/dL
Nitrite: NEGATIVE
Protein, ur: NEGATIVE mg/dL
Specific Gravity, Urine: 1.005 (ref 1.005–1.030)
pH: 7 (ref 5.0–8.0)

## 2023-06-22 LAB — TROPONIN I (HIGH SENSITIVITY)
Troponin I (High Sensitivity): 8 ng/L (ref <18)
Troponin I (High Sensitivity): 8 ng/L (ref <18)

## 2023-06-22 LAB — TSH: TSH: 0.644 u[IU]/mL (ref 0.350–4.500)

## 2023-06-22 LAB — MAGNESIUM: Magnesium: 1.7 mg/dL (ref 1.7–2.4)

## 2023-06-22 MED ORDER — FOSFOMYCIN TROMETHAMINE 3 G PO PACK
3.0000 g | PACK | Freq: Once | ORAL | Status: AC
Start: 2023-06-22 — End: 2023-06-22
  Administered 2023-06-22: 3 g via ORAL
  Filled 2023-06-22: qty 3

## 2023-06-22 MED ORDER — OXYCODONE-ACETAMINOPHEN 5-325 MG PO TABS
1.0000 | ORAL_TABLET | Freq: Once | ORAL | Status: AC
  Administered 2023-06-22: 1 via ORAL
  Filled 2023-06-22: qty 1

## 2023-06-22 MED ORDER — HYDRALAZINE HCL 20 MG/ML IJ SOLN: 5.0000 mg | Freq: Once | INTRAMUSCULAR | Status: DC

## 2023-06-22 MED ORDER — DIAZEPAM 5 MG PO TABS
5.0000 mg | ORAL_TABLET | Freq: Once | ORAL | Status: AC
  Administered 2023-06-22: 5 mg via ORAL
  Filled 2023-06-22: qty 1

## 2023-06-22 MED ORDER — MAGNESIUM SULFATE IN D5W 1-5 GM/100ML-% IV SOLN
1.0000 g | Freq: Once | INTRAVENOUS | Status: AC
  Administered 2023-06-22: 1 g via INTRAVENOUS
  Filled 2023-06-22: qty 100

## 2023-06-22 NOTE — Discharge Instructions (Signed)
 You have a large amount of stool in your rectum.  This can be treated with stool softeners, enemas, suppositories.  Typically, a combination of these things is used.  You can take MiraLAX  in high doses for bowel cleanout.  You have refused treatment for constipation while in the emergency department.  You should undergo aggressive treatment for your constipation at your facility.  Today, you were treated for UTI.  This was done with a one-time antibiotic.  Alleviation of your constipation will help minimize future recurrences of UTI.  Your CT scan showed what appears to be a chronically narrowed right ureter.  If you would like to follow-up with the urologist, a telephone number is below.  If you have persistently elevated blood pressures, you would benefit from a blood pressure medication.  Continue to monitor blood pressure at your facility and discuss further with primary care doctor.  Return to the emergency department for any new or worsening symptoms of concern.

## 2023-06-22 NOTE — ED Notes (Signed)
 Pt. Refused soap suds enema due to pain from cellulitis in her legs. Pt. States she will try to go to the bathroom herself at home.

## 2023-06-22 NOTE — ED Triage Notes (Signed)
 Pt BIB ems from her nursing facility for dizziness and hypertension. EMS reports no stroke like symptoms. Pt. Has hx of dementia. Pt. Has cellulitis on her legs and arms.

## 2023-06-22 NOTE — ED Provider Notes (Signed)
 [place] EMERGENCY DEPARTMENT AT [facility] Provider Note   CSN: [id number] Arrival date & time: 06/22/23  0701     History  Chief Complaint  Patient presents with   Hypertension    Penny Perez is a 89 y.o. female.   Hypertension  Patient presents for multiple complaints.  Medical history includes HTN, asthma, CKD, anxiety, GERD, fibromyalgia.  Her medications include Valium , Lasix , Synthroid , oxycodone .  She resides in assisted living facility.  This morning, she hit her call button stating that she felt dizzy.  Currently, patient states that she feels anxious.  She endorses pain all over but states that this is not new.  She has pain and erythema to her bilateral legs.  This was treated with clindamycin , last dose was 3 weeks ago.  Daughter reports that appearance of legs has improved.  Patient previously took blood pressure medications but, since being in assisted living, she has had normal to low blood pressures without antihypertensive medications.  She is not currently taking any blood pressure medications.  Blood pressure was noted to be elevated today at assisted living.  Daughter, who is at bedside, confirms that patient is in her normal mental state.     Home Medications Prior to Admission medications   Medication Sig Start Date End Date Taking? Authorizing Provider  acetaminophen  (TYLENOL ) 325 MG tablet Take 650 mg by mouth every 6 (six) hours as needed.    [provider]  albuterol  (PROAIR  HFA) 108 (90 BASE) MCG/ACT inhaler 2 puffs every 4 hours if needed- rescue inhaler Patient taking differently: Inhale 2 puffs into the lungs every 4 (four) hours as needed for wheezing or shortness of breath. 11/01/11 04/03/22  Rosa College D, MD  cetirizine (ZYRTEC) 10 MG tablet Take 10 mg by mouth daily.    [provider]  Cholecalciferol (VITAMIN D3) 1000 UNITS CAPS Take 1,000 Units by mouth daily.    [provider]  cromolyn  (NASALCROM) 5.2 MG/ACT nasal spray Place 1 spray into the nose 4 (four) times daily as needed for allergies or rhinitis.     [provider]  diazepam  (VALIUM ) 10 MG tablet Take 1 tablet by mouth as needed for anxiety or sleep.  09/04/12   [provider]  docusate sodium  (COLACE) 100 MG capsule Take 1 capsule (100 mg total) by mouth daily. 02/10/21   Kraig Peru, MD  furosemide  (LASIX ) 40 MG tablet Take 40 mg by mouth daily. 03/08/22   [provider]  hydrOXYzine  (ATARAX ) 10 MG tablet Take 10 mg by mouth 3 (three) times daily as needed for itching. 01/13/21   [provider]  levothyroxine  (SYNTHROID ) 125 MCG tablet Take 125 mcg by mouth daily. 03/08/22   [provider]  mirtazapine (REMERON) 7.5 MG tablet Take 7.5 mg by mouth at bedtime. 03/08/22   [provider]  MOVANTIK  25 MG TABS tablet Take 1 tablet (25 mg total) by mouth daily as needed (constipation). 05/08/19   Rai, Ripudeep K, MD  nitroGLYCERIN (NITROSTAT) 0.4 MG SL tablet Place under the tongue every 5 (five) minutes as needed for chest pain. 11/13/21   [provider]  nystatin  (MYCOSTATIN ) powder Apply topically 2 (two) times daily. To effected areas Patient taking differently: Apply topically 2 (two) times daily as needed (skin irritation). To effected areas 12/01/14   Debbe Fail, PA-C  omeprazole (PRILOSEC) 40 MG capsule Take 40 mg by mouth daily.  01/21/14   [provider]  Oxycodone  HCl  10 MG TABS Take 1 tablet (10 mg total) by mouth 3 (three) times daily as needed (pain). 02/09/21   Patel, Pranav M, MD  polyethylene glycol powder (MIRALAX ) 17 GM/SCOOP powder Start taking 1 capful 3 times a day. Slowly cut back as needed until you have normal bowel movements. 12/08/20   CardamaCamila Cecil, MD  potassium chloride  SA (KLOR-CON  M) 20 MEQ tablet Take 20 mEq by mouth daily. 12/12/21   [provider]  pramoxine (PROCTOFOAM) 1 % foam Place 1 application  rectally once as needed for hemorrhoids. 12/12/20   [provider]  Spacer/Aero-Holding Chambers (AEROCHAMBER MV) inhaler by Other route. Use as instructed     [provider]  triamcinolone cream (KENALOG) 0.5 % Apply 1 Application topically daily. 03/27/22   [provider]  vitamin B-12 1000 MCG tablet Take 1 tablet (1,000 mcg total) by mouth daily. 02/09/21   Kraig Peru, MD      Allergies    Linezolid, Sulfa antibiotics, Ampicillin, Cephalexin, Chlorhexidine , Clarithromycin, Codeine, Doxycycline, Erythromycin, Gatifloxacin, Hydrocodone, Penicillins, Latex, and Tape    Review of Systems   Review of Systems  Musculoskeletal:  Positive for arthralgias and myalgias.  Neurological:  Positive for dizziness.  Psychiatric/Behavioral:  The patient is nervous/anxious.   All other systems reviewed and are negative.   Physical Exam Updated Vital Signs BP (!) 151/130   Pulse 69   Temp (!) 97.5 F (36.4 C)   Resp 15   SpO2 96%  Physical Exam Vitals and nursing note reviewed.  Constitutional:      General: She is not in acute distress.    Appearance: Normal appearance. She is well-developed. She is not ill-appearing, toxic-appearing or diaphoretic.  HENT:     Head: Normocephalic and atraumatic.     Right Ear: External ear normal.     Left Ear: External ear normal.     Nose: Nose normal.     Mouth/Throat:     Mouth: Mucous membranes are moist.  Eyes:     Extraocular Movements: Extraocular movements intact.     Conjunctiva/sclera: Conjunctivae normal.  Cardiovascular:     Rate and Rhythm: Normal rate and regular rhythm.  Pulmonary:     Effort: Pulmonary effort is normal. No respiratory distress.  Abdominal:     General: There is no distension.     Palpations: Abdomen is soft.     Tenderness: There is no abdominal tenderness.  Musculoskeletal:        General: No swelling. Normal range of motion.     Cervical back: Normal range of motion and neck  supple.  Skin:    General: Skin is warm and dry.     Coloration: Skin is not jaundiced or pale.     Findings: Erythema present.  Neurological:     General: No focal deficit present.     Mental Status: She is alert. Mental status is at baseline. She is disoriented.     Cranial Nerves: No cranial nerve deficit.     Sensory: No sensory deficit.     Motor: No weakness.     Coordination: Coordination normal.  Psychiatric:        Mood and Affect: Mood normal.        Behavior: Behavior normal.     ED Results / Procedures / Treatments   Labs (all labs ordered are listed, but only abnormal results are displayed) Labs Reviewed  COMPREHENSIVE METABOLIC PANEL WITH GFR - Abnormal; Notable for the following components:  Result Value   Sodium 130 (*)    Chloride 97 (*)    Creatinine, Ser 1.17 (*)    Calcium  10.5 (*)    GFR, Estimated 45 (*)    All other components within normal limits  CBC WITH DIFFERENTIAL/PLATELET - Abnormal; Notable for the following components:   Eosinophils Absolute 1.1 (*)    All other components within normal limits  URINALYSIS, ROUTINE W REFLEX MICROSCOPIC - Abnormal; Notable for the following components:   Color, Urine STRAW (*)    Leukocytes,Ua LARGE (*)    Bacteria, UA RARE (*)    All other components within normal limits  URINE CULTURE  MAGNESIUM   TSH  TROPONIN I (HIGH SENSITIVITY)  TROPONIN I (HIGH SENSITIVITY)    EKG EKG Interpretation Date/Time:  Saturday Jun 22 2023 16:10:96 EDT Ventricular Rate:  69 PR Interval:  174 QRS Duration:  94 QT Interval:  414 QTC Calculation: 444 R Axis:   -41  Text Interpretation: Sinus rhythm Left axis deviation Confirmed by Iva Mariner ([id number]) on 06/22/2023 8:00:13 AM  Radiology CT Renal Stone Study Result Date: 06/22/2023 CLINICAL DATA:  Abdominal/flank pain. Dizziness and hypertension. Dementia. Cellulitis. EXAM: CT ABDOMEN AND PELVIS WITHOUT CONTRAST TECHNIQUE: Multidetector CT imaging of the abdomen and  pelvis was performed following the standard protocol without IV contrast. RADIATION DOSE REDUCTION: This exam was performed according to the departmental dose-optimization program which includes automated exposure control, adjustment of the mA and/or kV according to patient size and/or use of iterative reconstruction technique. COMPARISON:  02/02/2021 FINDINGS: Lower chest: Coronary and descending thoracic aortic atherosclerosis. Calcifications of the aortic valve. Bandlike scarring or atelectasis in the right middle lobe, lingula, and both lower lobes. This is most prominent medially in the left lower lobe where there is also some airway plugging. Lesser airway plugging in the right lower lobe. Hepatobiliary: Unremarkable Pancreas: Atrophic pancreas. Spleen: Unremarkable Adrenals/Urinary Tract: Distal ureters and urinary bladder obscured by streak artifact from the patient's bilateral hip implants. Fullness of the right renal pelvis and potential mild hydronephrosis on the right terminating at the UPJ, cannot exclude chronic UPJ narrowing. Atrophic left kidney. Extrarenal pelvis on the left. Stomach/Bowel: Prominent stool ball in the rectal vault, cannot exclude fecal impaction. I do not see definite associated wall thickening to imply stercoral colitis. Mild sigmoid colon diverticulosis.  No dilated small bowel. Vascular/Lymphatic: Atherosclerosis is present, including aortoiliac atherosclerotic disease. Reproductive: Uterus absent.  Adnexa unremarkable. Other: No supplemental non-categorized findings. Musculoskeletal: Levoconvex lumbar scoliosis with rotary component. Bilateral total hip prostheses. There is some lucency just cephalad to the acetabular shell of the left prosthesis anteriorly, a small amount of particulate disease is not excluded. Posterolateral rod and pedicle screw fixation at L4-5 with chronic grade 1 anterolisthesis and interbody fusion at this level. Exaggerated lumbar lordosis. Small  umbilical hernia contains adipose tissue. Low position of the anorectal junction raises suspicion for pelvic floor laxity. IMPRESSION: 1. Prominent stool ball in the rectal vault, cannot exclude fecal impaction. I do not see definite associated wall thickening to imply stercoral colitis. 2. Low position of the anorectal junction raises suspicion for pelvic floor laxity. 3. Fullness of the right renal pelvis and potential mild hydronephrosis on the right terminating at the UPJ, cannot exclude chronic UPJ narrowing. Atrophic left kidney. 4. Bandlike scarring or atelectasis in the right middle lobe, lingula, and both lower lobes. This is most prominent medially in the left lower lobe where there is also some airway plugging. Lesser airway plugging in the right  lower lobe. 5. Bilateral total hip prostheses. There is some lucency just cephalad to the acetabular shell of the left prosthesis anteriorly, a small amount of particulate disease is not excluded. 6. Levoconvex lumbar scoliosis with rotary component. Posterolateral rod and pedicle screw fixation at L4-5 with chronic grade 1 anterolisthesis and interbody fusion at this level. Exaggerated lumbar lordosis. 7. Small umbilical hernia contains adipose tissue. 8.  Aortic Atherosclerosis (ICD10-I70.0). Electronically Signed   By: Freida Jes M.D.   On: 06/22/2023 11:39   CT Head Wo Contrast Result Date: 06/22/2023 CLINICAL DATA:  Syncope/presyncope with cerebrovascular cause suspected EXAM: CT HEAD WITHOUT CONTRAST TECHNIQUE: Contiguous axial images were obtained from the base of the skull through the vertex without intravenous contrast. RADIATION DOSE REDUCTION: This exam was performed according to the departmental dose-optimization program which includes automated exposure control, adjustment of the mA and/or kV according to patient size and/or use of iterative reconstruction technique. COMPARISON:  Head CT 12/18/2021 FINDINGS: Brain: No evidence of acute  infarction, hemorrhage, hydrocephalus, extra-axial collection or mass lesion/mass effect. Generalized brain atrophy which is mild for age. Vascular: No hyperdense vessel or unexpected calcification. Skull: Normal. Negative for fracture or focal lesion. Sinuses/Orbits: No acute finding. IMPRESSION: No acute or reversible finding Electronically Signed   By: Ronnette Coke M.D.   On: 06/22/2023 11:33   DG Chest Portable 1 View Result Date: 06/22/2023 CLINICAL DATA:  Dizziness and hypertension EXAM: PORTABLE CHEST 1 VIEW COMPARISON:  12/18/2021 FINDINGS: Normal heart size and mediastinal contours. Aortic atherosclerosis. Lung volumes are low. No pleural fluid, interstitial edema or airspace disease. Portions of the left apex is obscured by the patient's mandible. Status post right shoulder arthroplasty. IMPRESSION: Low lung volumes. No acute findings. Electronically Signed   By: Kimberley Penman M.D.   On: 06/22/2023 09:27    Procedures Procedures    Medications Ordered in ED Medications  diazepam  (VALIUM ) tablet 5 mg (5 mg Oral Given 06/22/23 0927)  oxyCODONE -acetaminophen  (PERCOCET/ROXICET) 5-325 MG per tablet 1 tablet (1 tablet Oral Given 06/22/23 1007)  magnesium  sulfate IVPB 1 g 100 mL (1 g Intravenous New Bag/Given 06/22/23 1145)  fosfomycin (MONUROL) packet 3 g (3 g Oral Given 06/22/23 1141)    ED Course/ Medical Decision Making/ A&P                                 Medical Decision Making Amount and/or Complexity of Data Reviewed Labs: ordered. Radiology: ordered.  Risk Prescription drug management.   This patient presents to the ED for concern of dizziness, hypertension, this involves an extensive number of treatment options, and is a complaint that carries with it a high risk of complications and morbidity.  The differential diagnosis includes anxiety, pain, medication withdrawal, polypharmacy, infection, dehydration, deconditioning   Co morbidities that complicate the patient  evaluation  HTN, asthma, CKD, anxiety, GERD, fibromyalgia.  Her medications include Valium , Lasix , Synthroid , oxycodone    Additional history obtained:  Additional history obtained from patient's daughter External records from outside source obtained and reviewed including EMR   Lab Tests:  I Ordered, and personally interpreted labs.  The pertinent results include: Baseline creatinine, normal electrolytes, normal hemoglobin, no leukocytosis.  Urinalysis does show some evidence of bacteria and pyuria.   Imaging Studies ordered:  I ordered imaging studies including chest x-ray, CT of head, abdomen, pelvis I independently visualized and interpreted imaging which showed stool ball in rectal vault, fullness in right  renal pelvis with possible chronic UPJ narrowing, atrophic left kidney, atelectasis I agree with the radiologist interpretation   Cardiac Monitoring: / EKG:  The patient was maintained on a cardiac monitor.  I personally viewed and interpreted the cardiac monitored which showed an underlying rhythm of: Sinus rhythm   Problem List / ED Course / Critical interventions / Medication management  Patient presenting for initial complaint of dizziness while at assisted living facility.  Her blood pressure was noted to be elevated this morning.  Currently, she endorses feelings of anxiety.  She requested Valium .  Although she was previously prescribed 10 mg Valium , it does not appear on her facility med list.  5 mg dose was ordered.  Workup was initiated.  Patient endorsed worsening pain all over.  Although she received 10 mg of oxycodone  at her facility, 5 mg dose was ordered here.  On reassessment, patient's blood pressure is now on the softer side.  She is somnolent but easily awakened.  I suspect that her elevated blood pressure was secondary to pain and anxiety.  Urinalysis shows evidence of bacteria and pyuria.  Patient to be treated empirically for UTI.  On CT scan, patient had a  prominent stool ball in rectal vault.  Soapsuds enema was ordered.  Patient refused this.  She does have a daughter at bedside to assist with medical decision making.  Patient states that she would prefer to treat her constipation at her facility.  She is currently on stool softeners.  Additional instructions were provided on AVS.  Patient's blood pressure at this time has normalized.  She does request discharge home.  Daughter is in agreement.  Patient was discharged in stable condition. I ordered medication including Valium  for anxiolysis; Percocet for chronic pain; magnesium  sulfate for low-normal magnesium ; fosfomycin for empiric treatment of UTI Reevaluation of the patient after these medicines showed that the patient improved I have reviewed the patients home medicines and have made adjustments as needed   Social Determinants of Health:  Resides in nursing facility        Final Clinical Impression(s) / ED Diagnoses Final diagnoses:  Hypertension, unspecified type  Constipation, unspecified constipation type  Cystitis    Rx / DC Orders ED Discharge Orders     None         Iva Mariner, MD 06/22/23 1342

## 2023-06-24 LAB — URINE CULTURE: Culture: 40000 — AB

## 2023-06-25 ENCOUNTER — Telehealth (HOSPITAL_BASED_OUTPATIENT_CLINIC_OR_DEPARTMENT_OTHER): Payer: Self-pay | Admitting: *Deleted

## 2023-06-25 NOTE — Telephone Encounter (Signed)
 Post ED Visit - Positive Culture Follow-up  Culture report reviewed by antimicrobial stewardship pharmacist: [facility] []  Court Distance, Pharm.D. []  Skeet Duke, Pharm.D., BCPS AQ-ID []  Leslee Rase, Pharm.D., BCPS []  Garland Junk, Pharm.D., BCPS []  [place], [address].D., BCPS, AAHIVP []  Alcide Aly, Pharm.D., BCPS, AAHIVP []  Jerri Morale, PharmD, BCPS []  Graham Laws, PharmD, BCPS []  Cleda Curly, PharmD, BCPS []  Tamar Fairly, PharmD []  Ballard Levels, PharmD, BCPS []  Ollen Beverage, PharmD  [facility] [x]  Garland Junk, PharmD []  Sherryle Don, PharmD []  Van Gelinas, PharmD []  Delila Felty, Rph []  [place]) Cleora Daft, PharmD []  Augustina Block, PharmD []  Arie Kurtz, PharmD []  Sharlyn Deaner, PharmD []  Agnes Hose, PharmD []  Kendall Pauls, PharmD []  Gladstone Lamer, PharmD []  Armanda Bern, PharmD []  Tera Fellows, PharmD   Positive urine culture Treated with fosfomycin, organism sensitive to the same and no further patient follow-up is required at this time.  Zeb Heys 06/25/2023, 9:53 AM
